# Patient Record
Sex: Female | Born: 1985 | Race: White | Hispanic: No | Marital: Single | Smoking: Never smoker
Health system: Southern US, Community
[De-identification: ages and names within clinical notes are randomized; demographics above are authoritative.]

## PROBLEM LIST (undated history)

## (undated) DIAGNOSIS — I10 Essential (primary) hypertension: Secondary | ICD-10-CM

## (undated) DIAGNOSIS — F419 Anxiety disorder, unspecified: Secondary | ICD-10-CM

## (undated) DIAGNOSIS — M79673 Pain in unspecified foot: Secondary | ICD-10-CM

## (undated) DIAGNOSIS — J302 Other seasonal allergic rhinitis: Secondary | ICD-10-CM

## (undated) DIAGNOSIS — R12 Heartburn: Secondary | ICD-10-CM

## (undated) DIAGNOSIS — F329 Major depressive disorder, single episode, unspecified: Secondary | ICD-10-CM

## (undated) DIAGNOSIS — F32A Depression, unspecified: Secondary | ICD-10-CM

## (undated) DIAGNOSIS — K219 Gastro-esophageal reflux disease without esophagitis: Secondary | ICD-10-CM

## (undated) DIAGNOSIS — K2970 Gastritis, unspecified, without bleeding: Secondary | ICD-10-CM

## (undated) HISTORY — DX: Anxiety disorder, unspecified: F41.9

## (undated) HISTORY — DX: Other seasonal allergic rhinitis: J30.2

## (undated) HISTORY — PX: FOOT SURGERY: SHX648

## (undated) HISTORY — PX: WRIST SURGERY: SHX841

## (undated) HISTORY — DX: Heartburn: R12

## (undated) HISTORY — DX: Gastritis, unspecified, without bleeding: K29.70

---
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
## 2005-05-19 ENCOUNTER — Emergency Department (HOSPITAL_COMMUNITY): Admission: EM | Admit: 2005-05-19 | Discharge: 2005-05-19 | Payer: Self-pay | Admitting: Emergency Medicine

## 2005-06-08 ENCOUNTER — Inpatient Hospital Stay: Payer: Self-pay | Admitting: Infectious Diseases

## 2005-06-10 ENCOUNTER — Emergency Department (HOSPITAL_COMMUNITY): Admission: EM | Admit: 2005-06-10 | Discharge: 2005-06-10 | Payer: Self-pay | Admitting: Emergency Medicine

## 2005-06-19 ENCOUNTER — Inpatient Hospital Stay (HOSPITAL_COMMUNITY): Admission: EM | Admit: 2005-06-19 | Discharge: 2005-06-20 | Payer: Self-pay | Admitting: Emergency Medicine

## 2005-07-07 ENCOUNTER — Emergency Department (HOSPITAL_COMMUNITY): Admission: EM | Admit: 2005-07-07 | Discharge: 2005-07-08 | Payer: Self-pay | Admitting: Emergency Medicine

## 2005-07-08 ENCOUNTER — Emergency Department (HOSPITAL_COMMUNITY): Admission: EM | Admit: 2005-07-08 | Discharge: 2005-07-09 | Payer: Self-pay | Admitting: Emergency Medicine

## 2005-07-10 ENCOUNTER — Emergency Department: Payer: Self-pay | Admitting: Emergency Medicine

## 2005-07-11 ENCOUNTER — Inpatient Hospital Stay (HOSPITAL_COMMUNITY): Admission: EM | Admit: 2005-07-11 | Discharge: 2005-07-16 | Payer: Self-pay | Admitting: Emergency Medicine

## 2005-07-24 ENCOUNTER — Emergency Department (HOSPITAL_COMMUNITY): Admission: EM | Admit: 2005-07-24 | Discharge: 2005-07-25 | Payer: Self-pay | Admitting: Emergency Medicine

## 2005-07-27 ENCOUNTER — Inpatient Hospital Stay (HOSPITAL_COMMUNITY): Admission: EM | Admit: 2005-07-27 | Discharge: 2005-07-29 | Payer: Self-pay | Admitting: Emergency Medicine

## 2005-08-05 ENCOUNTER — Inpatient Hospital Stay (HOSPITAL_COMMUNITY): Admission: EM | Admit: 2005-08-05 | Discharge: 2005-08-10 | Payer: Self-pay | Admitting: Emergency Medicine

## 2005-08-22 ENCOUNTER — Emergency Department (HOSPITAL_COMMUNITY): Admission: EM | Admit: 2005-08-22 | Discharge: 2005-08-23 | Payer: Self-pay | Admitting: Emergency Medicine

## 2005-08-23 ENCOUNTER — Inpatient Hospital Stay: Payer: Self-pay | Admitting: Internal Medicine

## 2005-08-26 ENCOUNTER — Emergency Department: Payer: Self-pay | Admitting: Emergency Medicine

## 2005-08-28 ENCOUNTER — Emergency Department: Payer: Self-pay | Admitting: Unknown Physician Specialty

## 2005-08-28 ENCOUNTER — Emergency Department (HOSPITAL_COMMUNITY): Admission: EM | Admit: 2005-08-28 | Discharge: 2005-08-28 | Payer: Self-pay | Admitting: Emergency Medicine

## 2005-08-29 ENCOUNTER — Emergency Department: Payer: Self-pay | Admitting: Emergency Medicine

## 2005-09-06 ENCOUNTER — Observation Stay: Payer: Self-pay | Admitting: Internal Medicine

## 2005-09-08 ENCOUNTER — Emergency Department (HOSPITAL_COMMUNITY): Admission: EM | Admit: 2005-09-08 | Discharge: 2005-09-08 | Payer: Self-pay | Admitting: Emergency Medicine

## 2005-09-09 ENCOUNTER — Emergency Department (HOSPITAL_COMMUNITY): Admission: EM | Admit: 2005-09-09 | Discharge: 2005-09-10 | Payer: Self-pay | Admitting: *Deleted

## 2005-09-10 ENCOUNTER — Emergency Department: Payer: Self-pay | Admitting: Emergency Medicine

## 2005-09-16 ENCOUNTER — Emergency Department (HOSPITAL_COMMUNITY): Admission: EM | Admit: 2005-09-16 | Discharge: 2005-09-16 | Payer: Self-pay | Admitting: Emergency Medicine

## 2005-09-17 ENCOUNTER — Emergency Department: Payer: Self-pay | Admitting: Emergency Medicine

## 2005-09-20 ENCOUNTER — Emergency Department: Payer: Self-pay | Admitting: Emergency Medicine

## 2005-09-21 ENCOUNTER — Emergency Department (HOSPITAL_COMMUNITY): Admission: EM | Admit: 2005-09-21 | Discharge: 2005-09-21 | Payer: Self-pay | Admitting: Emergency Medicine

## 2005-09-22 ENCOUNTER — Emergency Department: Payer: Self-pay | Admitting: Internal Medicine

## 2005-09-23 ENCOUNTER — Emergency Department: Payer: Self-pay | Admitting: Emergency Medicine

## 2005-09-28 ENCOUNTER — Emergency Department: Payer: Self-pay | Admitting: Emergency Medicine

## 2005-09-28 ENCOUNTER — Ambulatory Visit: Payer: Self-pay | Admitting: Emergency Medicine

## 2005-10-04 ENCOUNTER — Emergency Department: Payer: Self-pay | Admitting: Emergency Medicine

## 2005-10-05 ENCOUNTER — Emergency Department (HOSPITAL_COMMUNITY): Admission: EM | Admit: 2005-10-05 | Discharge: 2005-10-06 | Payer: Self-pay | Admitting: Emergency Medicine

## 2005-10-12 ENCOUNTER — Emergency Department: Payer: Self-pay | Admitting: Unknown Physician Specialty

## 2005-10-14 ENCOUNTER — Emergency Department (HOSPITAL_COMMUNITY): Admission: EM | Admit: 2005-10-14 | Discharge: 2005-10-14 | Payer: Self-pay | Admitting: Emergency Medicine

## 2005-10-15 ENCOUNTER — Emergency Department (HOSPITAL_COMMUNITY): Admission: EM | Admit: 2005-10-15 | Discharge: 2005-10-15 | Payer: Self-pay | Admitting: Emergency Medicine

## 2005-10-19 ENCOUNTER — Emergency Department (HOSPITAL_COMMUNITY): Admission: EM | Admit: 2005-10-19 | Discharge: 2005-10-19 | Payer: Self-pay | Admitting: Emergency Medicine

## 2006-01-06 ENCOUNTER — Emergency Department: Payer: Self-pay | Admitting: General Practice

## 2006-02-03 ENCOUNTER — Emergency Department: Payer: Self-pay | Admitting: Emergency Medicine

## 2006-02-15 ENCOUNTER — Emergency Department (HOSPITAL_COMMUNITY): Admission: EM | Admit: 2006-02-15 | Discharge: 2006-02-16 | Payer: Self-pay | Admitting: Emergency Medicine

## 2006-02-19 ENCOUNTER — Emergency Department: Payer: Self-pay | Admitting: Emergency Medicine

## 2006-03-02 ENCOUNTER — Emergency Department: Payer: Self-pay | Admitting: Unknown Physician Specialty

## 2006-03-10 ENCOUNTER — Emergency Department (HOSPITAL_COMMUNITY): Admission: EM | Admit: 2006-03-10 | Discharge: 2006-03-10 | Payer: Self-pay | Admitting: Emergency Medicine

## 2006-03-11 ENCOUNTER — Emergency Department (HOSPITAL_COMMUNITY): Admission: EM | Admit: 2006-03-11 | Discharge: 2006-03-11 | Payer: Self-pay | Admitting: Emergency Medicine

## 2006-07-30 ENCOUNTER — Emergency Department (HOSPITAL_COMMUNITY): Admission: EM | Admit: 2006-07-30 | Discharge: 2006-07-30 | Payer: Self-pay | Admitting: Family Medicine

## 2006-08-05 ENCOUNTER — Emergency Department (HOSPITAL_COMMUNITY): Admission: EM | Admit: 2006-08-05 | Discharge: 2006-08-05 | Payer: Self-pay | Admitting: Family Medicine

## 2006-09-10 ENCOUNTER — Emergency Department (HOSPITAL_COMMUNITY): Admission: EM | Admit: 2006-09-10 | Discharge: 2006-09-10 | Payer: Self-pay | Admitting: Family Medicine

## 2006-10-08 ENCOUNTER — Emergency Department (HOSPITAL_COMMUNITY): Admission: EM | Admit: 2006-10-08 | Discharge: 2006-10-08 | Payer: Self-pay | Admitting: Emergency Medicine

## 2006-10-28 ENCOUNTER — Emergency Department (HOSPITAL_COMMUNITY): Admission: EM | Admit: 2006-10-28 | Discharge: 2006-10-28 | Payer: Self-pay | Admitting: Emergency Medicine

## 2006-11-06 ENCOUNTER — Emergency Department (HOSPITAL_COMMUNITY): Admission: EM | Admit: 2006-11-06 | Discharge: 2006-11-06 | Payer: Self-pay | Admitting: Emergency Medicine

## 2006-11-07 ENCOUNTER — Emergency Department (HOSPITAL_COMMUNITY): Admission: EM | Admit: 2006-11-07 | Discharge: 2006-11-07 | Payer: Self-pay | Admitting: Emergency Medicine

## 2006-11-11 ENCOUNTER — Emergency Department (HOSPITAL_COMMUNITY): Admission: EM | Admit: 2006-11-11 | Discharge: 2006-11-11 | Payer: Self-pay | Admitting: Emergency Medicine

## 2006-11-12 ENCOUNTER — Emergency Department (HOSPITAL_COMMUNITY): Admission: EM | Admit: 2006-11-12 | Discharge: 2006-11-12 | Payer: Self-pay | Admitting: Family Medicine

## 2006-11-26 ENCOUNTER — Emergency Department (HOSPITAL_COMMUNITY): Admission: EM | Admit: 2006-11-26 | Discharge: 2006-11-26 | Payer: Self-pay | Admitting: Emergency Medicine

## 2006-12-08 ENCOUNTER — Emergency Department (HOSPITAL_COMMUNITY): Admission: EM | Admit: 2006-12-08 | Discharge: 2006-12-08 | Payer: Self-pay | Admitting: Family Medicine

## 2007-01-22 ENCOUNTER — Emergency Department (HOSPITAL_COMMUNITY): Admission: EM | Admit: 2007-01-22 | Discharge: 2007-01-22 | Payer: Self-pay | Admitting: Emergency Medicine

## 2007-01-31 ENCOUNTER — Emergency Department (HOSPITAL_COMMUNITY): Admission: EM | Admit: 2007-01-31 | Discharge: 2007-01-31 | Payer: Self-pay | Admitting: Emergency Medicine

## 2007-04-04 ENCOUNTER — Emergency Department (HOSPITAL_COMMUNITY): Admission: EM | Admit: 2007-04-04 | Discharge: 2007-04-04 | Payer: Self-pay | Admitting: Emergency Medicine

## 2007-04-25 ENCOUNTER — Emergency Department (HOSPITAL_COMMUNITY): Admission: EM | Admit: 2007-04-25 | Discharge: 2007-04-25 | Payer: Self-pay | Admitting: Family Medicine

## 2007-06-07 ENCOUNTER — Emergency Department (HOSPITAL_COMMUNITY): Admission: EM | Admit: 2007-06-07 | Discharge: 2007-06-07 | Payer: Self-pay | Admitting: Emergency Medicine

## 2007-08-23 ENCOUNTER — Emergency Department (HOSPITAL_COMMUNITY): Admission: EM | Admit: 2007-08-23 | Discharge: 2007-08-23 | Payer: Self-pay | Admitting: Family Medicine

## 2007-12-15 ENCOUNTER — Emergency Department (HOSPITAL_COMMUNITY): Admission: EM | Admit: 2007-12-15 | Discharge: 2007-12-15 | Payer: Self-pay | Admitting: Family Medicine

## 2007-12-21 ENCOUNTER — Emergency Department (HOSPITAL_COMMUNITY): Admission: EM | Admit: 2007-12-21 | Discharge: 2007-12-22 | Payer: Self-pay | Admitting: Emergency Medicine

## 2008-01-04 ENCOUNTER — Emergency Department (HOSPITAL_COMMUNITY): Admission: EM | Admit: 2008-01-04 | Discharge: 2008-01-04 | Payer: Self-pay | Admitting: Emergency Medicine

## 2008-02-29 ENCOUNTER — Emergency Department (HOSPITAL_COMMUNITY): Admission: EM | Admit: 2008-02-29 | Discharge: 2008-02-29 | Payer: Self-pay | Admitting: Family Medicine

## 2008-03-11 ENCOUNTER — Emergency Department (HOSPITAL_COMMUNITY): Admission: EM | Admit: 2008-03-11 | Discharge: 2008-03-11 | Payer: Self-pay | Admitting: Emergency Medicine

## 2008-05-13 ENCOUNTER — Emergency Department (HOSPITAL_COMMUNITY): Admission: EM | Admit: 2008-05-13 | Discharge: 2008-05-13 | Payer: Self-pay | Admitting: Family Medicine

## 2008-05-21 ENCOUNTER — Inpatient Hospital Stay (HOSPITAL_COMMUNITY): Admission: AD | Admit: 2008-05-21 | Discharge: 2008-05-22 | Payer: Self-pay | Admitting: Family Medicine

## 2008-05-24 ENCOUNTER — Inpatient Hospital Stay (HOSPITAL_COMMUNITY): Admission: AD | Admit: 2008-05-24 | Discharge: 2008-05-24 | Payer: Self-pay | Admitting: Gynecology

## 2008-08-15 ENCOUNTER — Inpatient Hospital Stay (HOSPITAL_COMMUNITY): Admission: AD | Admit: 2008-08-15 | Discharge: 2008-08-15 | Payer: Self-pay | Admitting: Obstetrics and Gynecology

## 2008-11-27 ENCOUNTER — Inpatient Hospital Stay (HOSPITAL_COMMUNITY): Admission: AD | Admit: 2008-11-27 | Discharge: 2008-11-29 | Payer: Self-pay | Admitting: Obstetrics & Gynecology

## 2009-01-05 ENCOUNTER — Inpatient Hospital Stay (HOSPITAL_COMMUNITY): Admission: AD | Admit: 2009-01-05 | Discharge: 2009-01-06 | Payer: Self-pay | Admitting: Obstetrics and Gynecology

## 2009-01-11 ENCOUNTER — Inpatient Hospital Stay (HOSPITAL_COMMUNITY): Admission: AD | Admit: 2009-01-11 | Discharge: 2009-01-11 | Payer: Self-pay | Admitting: Obstetrics & Gynecology

## 2009-01-11 ENCOUNTER — Ambulatory Visit: Payer: Self-pay | Admitting: Physician Assistant

## 2009-01-14 ENCOUNTER — Inpatient Hospital Stay (HOSPITAL_COMMUNITY): Admission: AD | Admit: 2009-01-14 | Discharge: 2009-01-14 | Payer: Self-pay | Admitting: Obstetrics and Gynecology

## 2009-01-23 ENCOUNTER — Inpatient Hospital Stay (HOSPITAL_COMMUNITY): Admission: AD | Admit: 2009-01-23 | Discharge: 2009-01-23 | Payer: Self-pay | Admitting: Obstetrics & Gynecology

## 2009-01-23 ENCOUNTER — Inpatient Hospital Stay (HOSPITAL_COMMUNITY): Admission: AD | Admit: 2009-01-23 | Discharge: 2009-01-26 | Payer: Self-pay | Admitting: Obstetrics & Gynecology

## 2009-03-30 ENCOUNTER — Inpatient Hospital Stay (HOSPITAL_COMMUNITY): Admission: EM | Admit: 2009-03-30 | Discharge: 2009-04-04 | Payer: Self-pay | Admitting: Emergency Medicine

## 2009-04-06 ENCOUNTER — Emergency Department (HOSPITAL_COMMUNITY): Admission: EM | Admit: 2009-04-06 | Discharge: 2009-04-07 | Payer: Self-pay | Admitting: Emergency Medicine

## 2009-04-07 ENCOUNTER — Inpatient Hospital Stay (HOSPITAL_COMMUNITY): Admission: RE | Admit: 2009-04-07 | Discharge: 2009-04-10 | Payer: Self-pay | Admitting: Radiation Oncology

## 2009-04-17 ENCOUNTER — Encounter: Admission: RE | Admit: 2009-04-17 | Discharge: 2009-07-16 | Payer: Self-pay | Admitting: Orthopedic Surgery

## 2009-09-05 ENCOUNTER — Ambulatory Visit (HOSPITAL_COMMUNITY): Admission: RE | Admit: 2009-09-05 | Discharge: 2009-09-05 | Payer: Self-pay | Admitting: Obstetrics and Gynecology

## 2009-11-11 ENCOUNTER — Inpatient Hospital Stay (HOSPITAL_COMMUNITY): Admission: AD | Admit: 2009-11-11 | Discharge: 2009-11-11 | Payer: Self-pay | Admitting: Obstetrics and Gynecology

## 2009-11-19 ENCOUNTER — Ambulatory Visit (HOSPITAL_BASED_OUTPATIENT_CLINIC_OR_DEPARTMENT_OTHER): Admission: RE | Admit: 2009-11-19 | Discharge: 2009-11-19 | Payer: Self-pay | Admitting: Orthopedic Surgery

## 2009-12-02 ENCOUNTER — Inpatient Hospital Stay (HOSPITAL_COMMUNITY): Admission: AD | Admit: 2009-12-02 | Discharge: 2009-12-02 | Payer: Self-pay | Admitting: Obstetrics and Gynecology

## 2010-01-06 ENCOUNTER — Encounter: Admission: RE | Admit: 2010-01-06 | Discharge: 2010-01-06 | Payer: Self-pay | Admitting: Obstetrics and Gynecology

## 2010-01-08 ENCOUNTER — Encounter
Admission: RE | Admit: 2010-01-08 | Discharge: 2010-01-09 | Payer: Self-pay | Admitting: Physical Medicine & Rehabilitation

## 2010-01-09 ENCOUNTER — Ambulatory Visit: Payer: Self-pay | Admitting: Physical Medicine & Rehabilitation

## 2010-01-29 ENCOUNTER — Ambulatory Visit: Payer: Self-pay | Admitting: Obstetrics and Gynecology

## 2010-02-02 ENCOUNTER — Inpatient Hospital Stay (HOSPITAL_COMMUNITY): Admission: AD | Admit: 2010-02-02 | Discharge: 2010-02-02 | Payer: Self-pay | Admitting: Obstetrics and Gynecology

## 2010-02-03 ENCOUNTER — Inpatient Hospital Stay (HOSPITAL_COMMUNITY): Admission: AD | Admit: 2010-02-03 | Discharge: 2010-02-03 | Payer: Self-pay | Admitting: Obstetrics & Gynecology

## 2010-03-31 ENCOUNTER — Emergency Department (HOSPITAL_COMMUNITY): Admission: EM | Admit: 2010-03-31 | Discharge: 2010-03-31 | Payer: Self-pay | Admitting: Emergency Medicine

## 2010-06-18 ENCOUNTER — Emergency Department: Payer: Self-pay | Admitting: Internal Medicine

## 2010-06-22 ENCOUNTER — Encounter: Admission: RE | Admit: 2010-06-22 | Discharge: 2010-07-27 | Payer: Self-pay | Admitting: Anesthesiology

## 2010-08-18 ENCOUNTER — Encounter: Admission: RE | Admit: 2010-08-18 | Discharge: 2010-08-18 | Payer: Self-pay | Admitting: Internal Medicine

## 2010-10-08 ENCOUNTER — Emergency Department (HOSPITAL_COMMUNITY): Admission: EM | Admit: 2010-10-08 | Discharge: 2010-06-16 | Payer: Self-pay | Admitting: Emergency Medicine

## 2010-12-08 ENCOUNTER — Emergency Department (HOSPITAL_COMMUNITY)
Admission: EM | Admit: 2010-12-08 | Discharge: 2010-12-09 | Disposition: A | Discharge status: Home / Self Care | Payer: Medicaid Other | Attending: Emergency Medicine | Admitting: Emergency Medicine

## 2010-12-08 DIAGNOSIS — J029 Acute pharyngitis, unspecified: Secondary | ICD-10-CM | POA: Insufficient documentation

## 2010-12-08 DIAGNOSIS — I10 Essential (primary) hypertension: Secondary | ICD-10-CM | POA: Insufficient documentation

## 2010-12-08 DIAGNOSIS — R1033 Periumbilical pain: Secondary | ICD-10-CM | POA: Insufficient documentation

## 2010-12-08 DIAGNOSIS — R51 Headache: Secondary | ICD-10-CM | POA: Insufficient documentation

## 2010-12-08 DIAGNOSIS — R110 Nausea: Secondary | ICD-10-CM | POA: Insufficient documentation

## 2010-12-08 DIAGNOSIS — K219 Gastro-esophageal reflux disease without esophagitis: Secondary | ICD-10-CM | POA: Insufficient documentation

## 2010-12-08 LAB — BASIC METABOLIC PANEL
BUN: 8 mg/dL (ref 6–23)
CO2: 26 mEq/L (ref 19–32)
Calcium: 9.7 mg/dL (ref 8.4–10.5)
Chloride: 108 mEq/L (ref 96–112)
Creatinine, Ser: 0.73 mg/dL (ref 0.4–1.2)
GFR calc Af Amer: >60 mL/min (ref >60)
GFR calc non Af Amer: >60 mL/min (ref >60)
Glucose, Bld: 102 mg/dL — ABNORMAL HIGH (ref 70–99)
Potassium: 4 mEq/L (ref 3.5–5.1)
Sodium: 142 mEq/L (ref 135–145)

## 2010-12-08 LAB — DIFFERENTIAL
Basophils Absolute: 0 10*3/uL (ref 0.0–0.1)
Basophils Relative: 0 % (ref 0–1)
Eosinophils Absolute: 0.3 10*3/uL (ref 0.0–0.7)
Eosinophils Relative: 4 % (ref 0–5)
Lymphocytes Relative: 24 % (ref 12–46)
Lymphs Abs: 2 10*3/uL (ref 0.7–4.0)
Monocytes Absolute: 0.4 10*3/uL (ref 0.1–1.0)
Monocytes Relative: 5 % (ref 3–12)
Neutro Abs: 5.8 10*3/uL (ref 1.7–7.7)
Neutrophils Relative %: 68 % (ref 43–77)

## 2010-12-08 LAB — CBC
HCT: 36.5 % (ref 36.0–46.0)
Hemoglobin: 12.4 g/dL (ref 12.0–15.0)
MCH: 29.7 pg (ref 26.0–34.0)
MCHC: 34 g/dL (ref 30.0–36.0)
MCV: 87.5 fL (ref 78.0–100.0)
Platelets: 208 10*3/uL (ref 150–400)
RBC: 4.17 MIL/uL (ref 3.87–5.11)
RDW: 13.2 % (ref 11.5–15.5)
WBC: 8.5 10*3/uL (ref 4.0–10.5)

## 2010-12-09 ENCOUNTER — Emergency Department (HOSPITAL_COMMUNITY): Payer: Medicaid Other

## 2010-12-09 LAB — URINALYSIS, ROUTINE W REFLEX MICROSCOPIC
Bilirubin Urine: NEGATIVE
Hgb urine dipstick: NEGATIVE
Ketones, ur: NEGATIVE mg/dL
Nitrite: NEGATIVE
Protein, ur: NEGATIVE mg/dL
Specific Gravity, Urine: 1.024 (ref 1.005–1.030)
Urine Glucose, Fasting: NEGATIVE mg/dL
Urobilinogen, UA: 1 mg/dL (ref 0.0–1.0)
pH: 6 (ref 5.0–8.0)

## 2010-12-09 LAB — RAPID STREP SCREEN (MED CTR MEBANE ONLY): Streptococcus, Group A Screen (Direct): NEGATIVE

## 2010-12-09 LAB — POCT PREGNANCY, URINE: Preg Test, Ur: NEGATIVE

## 2011-01-14 LAB — CBC
HCT: 36.7 % (ref 36.0–46.0)
Hemoglobin: 12.6 g/dL (ref 12.0–15.0)
MCH: 29.4 pg (ref 26.0–34.0)
MCHC: 34.3 g/dL (ref 30.0–36.0)
MCV: 85.5 fL (ref 78.0–100.0)
Platelets: 187 10*3/uL (ref 150–400)
RBC: 4.29 MIL/uL (ref 3.87–5.11)
RDW: 13.3 % (ref 11.5–15.5)
WBC: 8.3 10*3/uL (ref 4.0–10.5)

## 2011-01-14 LAB — COMPREHENSIVE METABOLIC PANEL
ALT: 22 U/L (ref 0–35)
AST: 17 U/L (ref 0–37)
Albumin: 3.7 g/dL (ref 3.5–5.2)
Alkaline Phosphatase: 77 U/L (ref 39–117)
BUN: 10 mg/dL (ref 6–23)
CO2: 24 mEq/L (ref 19–32)
Calcium: 9.2 mg/dL (ref 8.4–10.5)
Chloride: 109 mEq/L (ref 96–112)
Creatinine, Ser: 0.61 mg/dL (ref 0.4–1.2)
GFR calc Af Amer: >60 mL/min (ref >60)
GFR calc non Af Amer: >60 mL/min (ref >60)
Glucose, Bld: 103 mg/dL — ABNORMAL HIGH (ref 70–99)
Potassium: 4.1 mEq/L (ref 3.5–5.1)
Sodium: 139 mEq/L (ref 135–145)
Total Bilirubin: 0.6 mg/dL (ref 0.3–1.2)
Total Protein: 6.7 g/dL (ref 6.0–8.3)

## 2011-01-14 LAB — DIFFERENTIAL
Basophils Absolute: 0 10*3/uL (ref 0.0–0.1)
Basophils Relative: 0 % (ref 0–1)
Eosinophils Absolute: 0.3 10*3/uL (ref 0.0–0.7)
Eosinophils Relative: 4 % (ref 0–5)
Lymphocytes Relative: 28 % (ref 12–46)
Lymphs Abs: 2.3 10*3/uL (ref 0.7–4.0)
Monocytes Absolute: 0.4 10*3/uL (ref 0.1–1.0)
Monocytes Relative: 5 % (ref 3–12)
Neutro Abs: 5.2 10*3/uL (ref 1.7–7.7)
Neutrophils Relative %: 63 % (ref 43–77)

## 2011-01-14 LAB — URINALYSIS, ROUTINE W REFLEX MICROSCOPIC
Bilirubin Urine: NEGATIVE
Glucose, UA: NEGATIVE mg/dL
Hgb urine dipstick: NEGATIVE
Ketones, ur: NEGATIVE mg/dL
Nitrite: NEGATIVE
Protein, ur: NEGATIVE mg/dL
Specific Gravity, Urine: 1.024 (ref 1.005–1.030)
Urobilinogen, UA: 1 mg/dL (ref 0.0–1.0)
pH: 7 (ref 5.0–8.0)

## 2011-01-14 LAB — LIPASE, BLOOD: Lipase: 37 U/L (ref 11–59)

## 2011-01-14 LAB — POCT PREGNANCY, URINE: Preg Test, Ur: NEGATIVE

## 2011-01-17 LAB — URINALYSIS, ROUTINE W REFLEX MICROSCOPIC
Bilirubin Urine: NEGATIVE
Glucose, UA: NEGATIVE mg/dL
Hgb urine dipstick: NEGATIVE
Ketones, ur: NEGATIVE mg/dL
Nitrite: NEGATIVE
Protein, ur: NEGATIVE mg/dL
Specific Gravity, Urine: >1.03 — ABNORMAL HIGH (ref 1.005–1.030)
Urobilinogen, UA: 0.2 mg/dL (ref 0.0–1.0)
pH: 5.5 (ref 5.0–8.0)

## 2011-01-17 LAB — CBC
HCT: 39.6 % (ref 36.0–46.0)
Hemoglobin: 13.5 g/dL (ref 12.0–15.0)
MCHC: 34 g/dL (ref 30.0–36.0)
MCV: 86.6 fL (ref 78.0–100.0)
Platelets: 212 10*3/uL (ref 150–400)
RBC: 4.58 MIL/uL (ref 3.87–5.11)
RDW: 14.7 % (ref 11.5–15.5)
WBC: 10.6 10*3/uL — ABNORMAL HIGH (ref 4.0–10.5)

## 2011-01-17 LAB — WET PREP, GENITAL
Trich, Wet Prep: NONE SEEN
Yeast Wet Prep HPF POC: NONE SEEN

## 2011-01-17 LAB — GC/CHLAMYDIA PROBE AMP, GENITAL
Chlamydia, DNA Probe: NEGATIVE
GC Probe Amp, Genital: NEGATIVE

## 2011-01-17 LAB — POCT HEMOGLOBIN-HEMACUE: Hemoglobin: 11.9 g/dL — ABNORMAL LOW (ref 12.0–15.0)

## 2011-01-18 LAB — HEMOCCULT GUIAC POC 1CARD (OFFICE): Fecal Occult Bld: NEGATIVE

## 2011-01-18 LAB — CBC
HCT: 36.2 % (ref 36.0–46.0)
Hemoglobin: 12.5 g/dL (ref 12.0–15.0)
MCHC: 34.5 g/dL (ref 30.0–36.0)
MCV: 85.1 fL (ref 78.0–100.0)
Platelets: 195 10*3/uL (ref 150–400)
RBC: 4.26 MIL/uL (ref 3.87–5.11)
RDW: 14.6 % (ref 11.5–15.5)
WBC: 12.7 10*3/uL — ABNORMAL HIGH (ref 4.0–10.5)

## 2011-01-18 LAB — URINALYSIS, ROUTINE W REFLEX MICROSCOPIC
Bilirubin Urine: NEGATIVE
Glucose, UA: NEGATIVE mg/dL
Hgb urine dipstick: NEGATIVE
Ketones, ur: NEGATIVE mg/dL
Nitrite: NEGATIVE
Protein, ur: NEGATIVE mg/dL
Specific Gravity, Urine: 1.02 (ref 1.005–1.030)
Urobilinogen, UA: 0.2 mg/dL (ref 0.0–1.0)
pH: 6 (ref 5.0–8.0)

## 2011-01-18 LAB — DIFFERENTIAL
Basophils Absolute: 0 10*3/uL (ref 0.0–0.1)
Basophils Relative: 0 % (ref 0–1)
Eosinophils Absolute: 0.1 10*3/uL (ref 0.0–0.7)
Eosinophils Relative: 1 % (ref 0–5)
Lymphocytes Relative: 12 % (ref 12–46)
Lymphs Abs: 1.5 10*3/uL (ref 0.7–4.0)
Monocytes Absolute: 0.4 10*3/uL (ref 0.1–1.0)
Monocytes Relative: 3 % (ref 3–12)
Neutro Abs: 10.7 10*3/uL — ABNORMAL HIGH (ref 1.7–7.7)
Neutrophils Relative %: 84 % — ABNORMAL HIGH (ref 43–77)

## 2011-01-18 LAB — COMPREHENSIVE METABOLIC PANEL
ALT: 28 U/L (ref 0–35)
AST: 19 U/L (ref 0–37)
Albumin: 3.9 g/dL (ref 3.5–5.2)
Alkaline Phosphatase: 85 U/L (ref 39–117)
BUN: 7 mg/dL (ref 6–23)
CO2: 23 mEq/L (ref 19–32)
Calcium: 9.1 mg/dL (ref 8.4–10.5)
Chloride: 106 mEq/L (ref 96–112)
Creatinine, Ser: 0.78 mg/dL (ref 0.4–1.2)
GFR calc Af Amer: >60 mL/min (ref >60)
GFR calc non Af Amer: >60 mL/min (ref >60)
Glucose, Bld: 102 mg/dL — ABNORMAL HIGH (ref 70–99)
Potassium: 3.5 mEq/L (ref 3.5–5.1)
Sodium: 134 mEq/L — ABNORMAL LOW (ref 135–145)
Total Bilirubin: 1 mg/dL (ref 0.3–1.2)
Total Protein: 7.3 g/dL (ref 6.0–8.3)

## 2011-01-18 LAB — RAPID STREP SCREEN (MED CTR MEBANE ONLY): Streptococcus, Group A Screen (Direct): NEGATIVE

## 2011-01-18 LAB — LIPASE, BLOOD: Lipase: 27 U/L (ref 11–59)

## 2011-01-18 LAB — POCT PREGNANCY, URINE: Preg Test, Ur: NEGATIVE

## 2011-01-20 LAB — URINALYSIS, ROUTINE W REFLEX MICROSCOPIC
Bilirubin Urine: NEGATIVE
Bilirubin Urine: NEGATIVE
Glucose, UA: NEGATIVE mg/dL
Glucose, UA: NEGATIVE mg/dL
Ketones, ur: NEGATIVE mg/dL
Ketones, ur: NEGATIVE mg/dL
Leukocytes, UA: NEGATIVE
Nitrite: NEGATIVE
Nitrite: NEGATIVE
Protein, ur: NEGATIVE mg/dL
Protein, ur: NEGATIVE mg/dL
Specific Gravity, Urine: 1.02 (ref 1.005–1.030)
Specific Gravity, Urine: 1.015 (ref 1.005–1.030)
Urobilinogen, UA: 0.2 mg/dL (ref 0.0–1.0)
Urobilinogen, UA: 0.2 mg/dL (ref 0.0–1.0)
pH: 7 (ref 5.0–8.0)
pH: 7.5 (ref 5.0–8.0)

## 2011-01-20 LAB — POCT PREGNANCY, URINE: Preg Test, Ur: NEGATIVE

## 2011-01-20 LAB — CBC
HCT: 35.5 % — ABNORMAL LOW (ref 36.0–46.0)
Hemoglobin: 12.1 g/dL (ref 12.0–15.0)
MCHC: 34 g/dL (ref 30.0–36.0)
MCV: 86.5 fL (ref 78.0–100.0)
Platelets: 181 10*3/uL (ref 150–400)
RBC: 4.11 MIL/uL (ref 3.87–5.11)
RDW: 14.3 % (ref 11.5–15.5)
WBC: 8.2 10*3/uL (ref 4.0–10.5)

## 2011-01-20 LAB — URINE MICROSCOPIC-ADD ON

## 2011-01-24 LAB — POCT PREGNANCY, URINE: Preg Test, Ur: NEGATIVE

## 2011-02-08 LAB — URINALYSIS, MICROSCOPIC ONLY
Bilirubin Urine: NEGATIVE
Glucose, UA: NEGATIVE mg/dL
Hgb urine dipstick: NEGATIVE
Ketones, ur: NEGATIVE mg/dL
Nitrite: NEGATIVE
Protein, ur: NEGATIVE mg/dL
Specific Gravity, Urine: 1.018 (ref 1.005–1.030)
Urobilinogen, UA: 1 mg/dL (ref 0.0–1.0)
pH: 7.5 (ref 5.0–8.0)

## 2011-02-08 LAB — POCT I-STAT, CHEM 8
BUN: 11 mg/dL (ref 6–23)
Calcium, Ion: 1.14 mmol/L (ref 1.12–1.32)
Chloride: 109 mEq/L (ref 96–112)
Creatinine, Ser: 1 mg/dL (ref 0.4–1.2)
Glucose, Bld: 92 mg/dL (ref 70–99)
HCT: 35 % — ABNORMAL LOW (ref 36.0–46.0)
Hemoglobin: 11.9 g/dL — ABNORMAL LOW (ref 12.0–15.0)
Potassium: 3.9 mEq/L (ref 3.5–5.1)
Sodium: 137 mEq/L (ref 135–145)
TCO2: 19 mmol/L (ref 0–100)

## 2011-02-08 LAB — CBC
HCT: 29.9 % — ABNORMAL LOW (ref 36.0–46.0)
HCT: 31.1 % — ABNORMAL LOW (ref 36.0–46.0)
HCT: 33.9 % — ABNORMAL LOW (ref 36.0–46.0)
HCT: 36.3 % (ref 36.0–46.0)
Hemoglobin: 10.4 g/dL — ABNORMAL LOW (ref 12.0–15.0)
Hemoglobin: 10.5 g/dL — ABNORMAL LOW (ref 12.0–15.0)
Hemoglobin: 11.8 g/dL — ABNORMAL LOW (ref 12.0–15.0)
Hemoglobin: 12.4 g/dL (ref 12.0–15.0)
MCHC: 33.8 g/dL (ref 30.0–36.0)
MCHC: 34.1 g/dL (ref 30.0–36.0)
MCHC: 34.7 g/dL (ref 30.0–36.0)
MCHC: 34.9 g/dL (ref 30.0–36.0)
MCV: 85.6 fL (ref 78.0–100.0)
MCV: 85.7 fL (ref 78.0–100.0)
MCV: 86.4 fL (ref 78.0–100.0)
MCV: 86.6 fL (ref 78.0–100.0)
Platelets: 140 10*3/uL — ABNORMAL LOW (ref 150–400)
Platelets: 165 10*3/uL (ref 150–400)
Platelets: 244 10*3/uL (ref 150–400)
Platelets: 254 10*3/uL (ref 150–400)
RBC: 3.48 MIL/uL — ABNORMAL LOW (ref 3.87–5.11)
RBC: 3.63 MIL/uL — ABNORMAL LOW (ref 3.87–5.11)
RBC: 3.92 MIL/uL (ref 3.87–5.11)
RBC: 4.19 MIL/uL (ref 3.87–5.11)
RDW: 14.2 % (ref 11.5–15.5)
RDW: 14.3 % (ref 11.5–15.5)
RDW: 15 % (ref 11.5–15.5)
RDW: 15.1 % (ref 11.5–15.5)
WBC: 5.7 10*3/uL (ref 4.0–10.5)
WBC: 6.1 10*3/uL (ref 4.0–10.5)
WBC: 8.3 10*3/uL (ref 4.0–10.5)
WBC: 8.4 10*3/uL (ref 4.0–10.5)

## 2011-02-08 LAB — DIFFERENTIAL
Basophils Absolute: 0.1 10*3/uL (ref 0.0–0.1)
Basophils Relative: 1 % (ref 0–1)
Eosinophils Absolute: 0.1 10*3/uL (ref 0.0–0.7)
Eosinophils Relative: 1 % (ref 0–5)
Lymphocytes Relative: 14 % (ref 12–46)
Lymphs Abs: 1.1 10*3/uL (ref 0.7–4.0)
Monocytes Absolute: 0.4 10*3/uL (ref 0.1–1.0)
Monocytes Relative: 5 % (ref 3–12)
Neutro Abs: 6.6 10*3/uL (ref 1.7–7.7)
Neutrophils Relative %: 79 % — ABNORMAL HIGH (ref 43–77)

## 2011-02-08 LAB — URINALYSIS, ROUTINE W REFLEX MICROSCOPIC
Bilirubin Urine: NEGATIVE
Glucose, UA: NEGATIVE mg/dL
Ketones, ur: NEGATIVE mg/dL
Leukocytes, UA: NEGATIVE
Nitrite: NEGATIVE
Protein, ur: NEGATIVE mg/dL
Specific Gravity, Urine: 1.023 (ref 1.005–1.030)
Urobilinogen, UA: 1 mg/dL (ref 0.0–1.0)
pH: 6 (ref 5.0–8.0)

## 2011-02-08 LAB — BASIC METABOLIC PANEL
BUN: 2 mg/dL — ABNORMAL LOW (ref 6–23)
CO2: 29 mEq/L (ref 19–32)
Calcium: 8.8 mg/dL (ref 8.4–10.5)
Chloride: 106 mEq/L (ref 96–112)
Creatinine, Ser: 0.78 mg/dL (ref 0.4–1.2)
GFR calc Af Amer: >60 mL/min (ref >60)
GFR calc non Af Amer: >60 mL/min (ref >60)
Glucose, Bld: 94 mg/dL (ref 70–99)
Potassium: 3.3 mEq/L — ABNORMAL LOW (ref 3.5–5.1)
Sodium: 139 mEq/L (ref 135–145)

## 2011-02-08 LAB — URINE CULTURE
Colony Count: 25000
Special Requests: POSITIVE

## 2011-02-08 LAB — HCG, SERUM, QUALITATIVE: Preg, Serum: NEGATIVE

## 2011-02-08 LAB — URINE MICROSCOPIC-ADD ON

## 2011-02-08 LAB — PROTIME-INR
INR: 1 (ref 0.00–1.49)
Prothrombin Time: 13.8 seconds (ref 11.6–15.2)

## 2011-02-08 LAB — APTT: aPTT: 31 seconds (ref 24–37)

## 2011-02-08 LAB — POCT PREGNANCY, URINE: Preg Test, Ur: NEGATIVE

## 2011-02-09 LAB — URINALYSIS, ROUTINE W REFLEX MICROSCOPIC
Bilirubin Urine: NEGATIVE
Glucose, UA: NEGATIVE mg/dL
Ketones, ur: 15 mg/dL — AB
Leukocytes, UA: NEGATIVE
Nitrite: NEGATIVE
Protein, ur: 100 mg/dL — AB
Specific Gravity, Urine: 1.029 (ref 1.005–1.030)
Urobilinogen, UA: 1 mg/dL (ref 0.0–1.0)
pH: 5 (ref 5.0–8.0)

## 2011-02-09 LAB — DIFFERENTIAL
Basophils Absolute: 0 10*3/uL (ref 0.0–0.1)
Basophils Relative: 0 % (ref 0–1)
Eosinophils Absolute: 0.1 10*3/uL (ref 0.0–0.7)
Eosinophils Relative: 1 % (ref 0–5)
Lymphocytes Relative: 11 % — ABNORMAL LOW (ref 12–46)
Lymphs Abs: 1.3 10*3/uL (ref 0.7–4.0)
Monocytes Absolute: 0.3 10*3/uL (ref 0.1–1.0)
Monocytes Relative: 3 % (ref 3–12)
Neutro Abs: 9.7 10*3/uL — ABNORMAL HIGH (ref 1.7–7.7)
Neutrophils Relative %: 84 % — ABNORMAL HIGH (ref 43–77)

## 2011-02-09 LAB — CBC
HCT: 32.7 % — ABNORMAL LOW (ref 36.0–46.0)
HCT: 38.2 % (ref 36.0–46.0)
Hemoglobin: 11.4 g/dL — ABNORMAL LOW (ref 12.0–15.0)
Hemoglobin: 13 g/dL (ref 12.0–15.0)
MCHC: 34 g/dL (ref 30.0–36.0)
MCHC: 35 g/dL (ref 30.0–36.0)
MCV: 85.2 fL (ref 78.0–100.0)
MCV: 85.9 fL (ref 78.0–100.0)
Platelets: 153 10*3/uL (ref 150–400)
Platelets: 223 10*3/uL (ref 150–400)
RBC: 3.8 MIL/uL — ABNORMAL LOW (ref 3.87–5.11)
RBC: 4.48 MIL/uL (ref 3.87–5.11)
RDW: 14.2 % (ref 11.5–15.5)
RDW: 14.7 % (ref 11.5–15.5)
WBC: 11.5 10*3/uL — ABNORMAL HIGH (ref 4.0–10.5)
WBC: 6.1 10*3/uL (ref 4.0–10.5)

## 2011-02-09 LAB — COMPREHENSIVE METABOLIC PANEL
ALT: 51 U/L — ABNORMAL HIGH (ref 0–35)
AST: 42 U/L — ABNORMAL HIGH (ref 0–37)
Albumin: 3.8 g/dL (ref 3.5–5.2)
Alkaline Phosphatase: 78 U/L (ref 39–117)
BUN: 6 mg/dL (ref 6–23)
CO2: 22 mEq/L (ref 19–32)
Calcium: 8.9 mg/dL (ref 8.4–10.5)
Chloride: 108 mEq/L (ref 96–112)
Creatinine, Ser: 0.86 mg/dL (ref 0.4–1.2)
GFR calc Af Amer: >60 mL/min (ref >60)
GFR calc non Af Amer: >60 mL/min (ref >60)
Glucose, Bld: 123 mg/dL — ABNORMAL HIGH (ref 70–99)
Potassium: 2.8 mEq/L — ABNORMAL LOW (ref 3.5–5.1)
Sodium: 139 mEq/L (ref 135–145)
Total Bilirubin: 0.4 mg/dL (ref 0.3–1.2)
Total Protein: 6.9 g/dL (ref 6.0–8.3)

## 2011-02-09 LAB — POCT I-STAT, CHEM 8
BUN: 6 mg/dL (ref 6–23)
Calcium, Ion: 1.16 mmol/L (ref 1.12–1.32)
Chloride: 108 mEq/L (ref 96–112)
Creatinine, Ser: 1 mg/dL (ref 0.4–1.2)
Glucose, Bld: 119 mg/dL — ABNORMAL HIGH (ref 70–99)
HCT: 38 % (ref 36.0–46.0)
Hemoglobin: 12.9 g/dL (ref 12.0–15.0)
Potassium: 3.1 mEq/L — ABNORMAL LOW (ref 3.5–5.1)
Sodium: 140 mEq/L (ref 135–145)
TCO2: 20 mmol/L (ref 0–100)

## 2011-02-09 LAB — BASIC METABOLIC PANEL
BUN: 4 mg/dL — ABNORMAL LOW (ref 6–23)
CO2: 26 mEq/L (ref 19–32)
Calcium: 8.8 mg/dL (ref 8.4–10.5)
Chloride: 106 mEq/L (ref 96–112)
Creatinine, Ser: 0.81 mg/dL (ref 0.4–1.2)
GFR calc Af Amer: >60 mL/min (ref >60)
GFR calc non Af Amer: >60 mL/min (ref >60)
Glucose, Bld: 87 mg/dL (ref 70–99)
Potassium: 3.6 mEq/L (ref 3.5–5.1)
Sodium: 138 mEq/L (ref 135–145)

## 2011-02-09 LAB — PREGNANCY, URINE: Preg Test, Ur: NEGATIVE

## 2011-02-09 LAB — PROTIME-INR
INR: 1.1 (ref 0.00–1.49)
Prothrombin Time: 14.4 seconds (ref 11.6–15.2)

## 2011-02-09 LAB — URINE MICROSCOPIC-ADD ON

## 2011-02-09 LAB — APTT: aPTT: 26 seconds (ref 24–37)

## 2011-02-11 LAB — COMPREHENSIVE METABOLIC PANEL
ALT: 16 U/L (ref 0–35)
ALT: 17 U/L (ref 0–35)
AST: 18 U/L (ref 0–37)
AST: 18 U/L (ref 0–37)
Albumin: 2.7 g/dL — ABNORMAL LOW (ref 3.5–5.2)
Albumin: 2.9 g/dL — ABNORMAL LOW (ref 3.5–5.2)
Alkaline Phosphatase: 122 U/L — ABNORMAL HIGH (ref 39–117)
Alkaline Phosphatase: 139 U/L — ABNORMAL HIGH (ref 39–117)
BUN: 5 mg/dL — ABNORMAL LOW (ref 6–23)
BUN: 5 mg/dL — ABNORMAL LOW (ref 6–23)
CO2: 19 mEq/L (ref 19–32)
CO2: 23 mEq/L (ref 19–32)
Calcium: 8.8 mg/dL (ref 8.4–10.5)
Calcium: 9.1 mg/dL (ref 8.4–10.5)
Chloride: 107 mEq/L (ref 96–112)
Chloride: 109 mEq/L (ref 96–112)
Creatinine, Ser: 0.48 mg/dL (ref 0.4–1.2)
Creatinine, Ser: 0.55 mg/dL (ref 0.4–1.2)
GFR calc Af Amer: >60 mL/min (ref >60)
GFR calc Af Amer: >60 mL/min (ref >60)
GFR calc non Af Amer: >60 mL/min (ref >60)
GFR calc non Af Amer: >60 mL/min (ref >60)
Glucose, Bld: 88 mg/dL (ref 70–99)
Glucose, Bld: 89 mg/dL (ref 70–99)
Potassium: 3.7 mEq/L (ref 3.5–5.1)
Potassium: 3.8 mEq/L (ref 3.5–5.1)
Sodium: 136 mEq/L (ref 135–145)
Sodium: 136 mEq/L (ref 135–145)
Total Bilirubin: 0.2 mg/dL — ABNORMAL LOW (ref 0.3–1.2)
Total Bilirubin: 0.6 mg/dL (ref 0.3–1.2)
Total Protein: 5.5 g/dL — ABNORMAL LOW (ref 6.0–8.3)
Total Protein: 6.1 g/dL (ref 6.0–8.3)

## 2011-02-11 LAB — CBC
HCT: 32.2 % — ABNORMAL LOW (ref 36.0–46.0)
HCT: 35.7 % — ABNORMAL LOW (ref 36.0–46.0)
HCT: 36.8 % (ref 36.0–46.0)
Hemoglobin: 11 g/dL — ABNORMAL LOW (ref 12.0–15.0)
Hemoglobin: 12.1 g/dL (ref 12.0–15.0)
Hemoglobin: 12.6 g/dL (ref 12.0–15.0)
MCHC: 34 g/dL (ref 30.0–36.0)
MCHC: 34.1 g/dL (ref 30.0–36.0)
MCHC: 34.2 g/dL (ref 30.0–36.0)
MCV: 87.7 fL (ref 78.0–100.0)
MCV: 88.4 fL (ref 78.0–100.0)
MCV: 88.9 fL (ref 78.0–100.0)
Platelets: 155 10*3/uL (ref 150–400)
Platelets: 186 10*3/uL (ref 150–400)
Platelets: 205 10*3/uL (ref 150–400)
RBC: 3.61 MIL/uL — ABNORMAL LOW (ref 3.87–5.11)
RBC: 4.07 MIL/uL (ref 3.87–5.11)
RBC: 4.16 MIL/uL (ref 3.87–5.11)
RDW: 14.5 % (ref 11.5–15.5)
RDW: 14.6 % (ref 11.5–15.5)
RDW: 14.8 % (ref 11.5–15.5)
WBC: 12.1 10*3/uL — ABNORMAL HIGH (ref 4.0–10.5)
WBC: 13.6 10*3/uL — ABNORMAL HIGH (ref 4.0–10.5)
WBC: 9.5 10*3/uL (ref 4.0–10.5)

## 2011-02-11 LAB — URINALYSIS, ROUTINE W REFLEX MICROSCOPIC
Bilirubin Urine: NEGATIVE
Bilirubin Urine: NEGATIVE
Glucose, UA: NEGATIVE mg/dL
Glucose, UA: NEGATIVE mg/dL
Hgb urine dipstick: NEGATIVE
Hgb urine dipstick: NEGATIVE
Ketones, ur: NEGATIVE mg/dL
Ketones, ur: NEGATIVE mg/dL
Nitrite: NEGATIVE
Nitrite: NEGATIVE
Protein, ur: NEGATIVE mg/dL
Protein, ur: NEGATIVE mg/dL
Specific Gravity, Urine: 1.025 (ref 1.005–1.030)
Specific Gravity, Urine: 1.02 (ref 1.005–1.030)
Urobilinogen, UA: 0.2 mg/dL (ref 0.0–1.0)
Urobilinogen, UA: 0.2 mg/dL (ref 0.0–1.0)
pH: 6.5 (ref 5.0–8.0)
pH: 6.5 (ref 5.0–8.0)

## 2011-02-11 LAB — URIC ACID
Uric Acid, Serum: 4 mg/dL (ref 2.4–7.0)
Uric Acid, Serum: 4.3 mg/dL (ref 2.4–7.0)

## 2011-02-11 LAB — LACTATE DEHYDROGENASE
LDH: 116 U/L (ref 94–250)
LDH: 136 U/L (ref 94–250)

## 2011-02-11 LAB — RPR: RPR Ser Ql: NONREACTIVE

## 2011-02-15 LAB — DIFFERENTIAL
Basophils Absolute: 0 10*3/uL (ref 0.0–0.1)
Basophils Absolute: 0 10*3/uL (ref 0.0–0.1)
Basophils Relative: 0 % (ref 0–1)
Basophils Relative: 0 % (ref 0–1)
Eosinophils Absolute: 0 10*3/uL (ref 0.0–0.7)
Eosinophils Absolute: 0 10*3/uL (ref 0.0–0.7)
Eosinophils Relative: 0 % (ref 0–5)
Eosinophils Relative: 0 % (ref 0–5)
Lymphocytes Relative: 13 % (ref 12–46)
Lymphocytes Relative: 3 % — ABNORMAL LOW (ref 12–46)
Lymphs Abs: 0.4 10*3/uL — ABNORMAL LOW (ref 0.7–4.0)
Lymphs Abs: 1.2 10*3/uL (ref 0.7–4.0)
Monocytes Absolute: 0.3 10*3/uL (ref 0.1–1.0)
Monocytes Absolute: 0.5 10*3/uL (ref 0.1–1.0)
Monocytes Relative: 2 % — ABNORMAL LOW (ref 3–12)
Monocytes Relative: 5 % (ref 3–12)
Neutro Abs: 14.5 10*3/uL — ABNORMAL HIGH (ref 1.7–7.7)
Neutro Abs: 7.3 10*3/uL (ref 1.7–7.7)
Neutrophils Relative %: 81 % — ABNORMAL HIGH (ref 43–77)
Neutrophils Relative %: 95 % — ABNORMAL HIGH (ref 43–77)

## 2011-02-15 LAB — COMPREHENSIVE METABOLIC PANEL
ALT: 17 U/L (ref 0–35)
ALT: 18 U/L (ref 0–35)
AST: 16 U/L (ref 0–37)
AST: 17 U/L (ref 0–37)
Albumin: 2.4 g/dL — ABNORMAL LOW (ref 3.5–5.2)
Albumin: 2.8 g/dL — ABNORMAL LOW (ref 3.5–5.2)
Alkaline Phosphatase: 74 U/L (ref 39–117)
Alkaline Phosphatase: 87 U/L (ref 39–117)
BUN: 4 mg/dL — ABNORMAL LOW (ref 6–23)
BUN: 6 mg/dL (ref 6–23)
CO2: 22 mEq/L (ref 19–32)
CO2: 27 mEq/L (ref 19–32)
Calcium: 8.8 mg/dL (ref 8.4–10.5)
Calcium: 8.9 mg/dL (ref 8.4–10.5)
Chloride: 108 mEq/L (ref 96–112)
Chloride: 108 mEq/L (ref 96–112)
Creatinine, Ser: 0.49 mg/dL (ref 0.4–1.2)
Creatinine, Ser: 0.55 mg/dL (ref 0.4–1.2)
GFR calc Af Amer: >60 mL/min (ref >60)
GFR calc Af Amer: >60 mL/min (ref >60)
GFR calc non Af Amer: >60 mL/min (ref >60)
GFR calc non Af Amer: >60 mL/min (ref >60)
Glucose, Bld: 104 mg/dL — ABNORMAL HIGH (ref 70–99)
Glucose, Bld: 81 mg/dL (ref 70–99)
Potassium: 3.8 mEq/L (ref 3.5–5.1)
Potassium: 3.9 mEq/L (ref 3.5–5.1)
Sodium: 136 mEq/L (ref 135–145)
Sodium: 139 mEq/L (ref 135–145)
Total Bilirubin: 0.5 mg/dL (ref 0.3–1.2)
Total Bilirubin: 0.7 mg/dL (ref 0.3–1.2)
Total Protein: 4.9 g/dL — ABNORMAL LOW (ref 6.0–8.3)
Total Protein: 6.2 g/dL (ref 6.0–8.3)

## 2011-02-15 LAB — URINALYSIS, ROUTINE W REFLEX MICROSCOPIC
Glucose, UA: NEGATIVE mg/dL
Hgb urine dipstick: NEGATIVE
Ketones, ur: 15 mg/dL — AB
Leukocytes, UA: NEGATIVE
Nitrite: NEGATIVE
Protein, ur: 30 mg/dL — AB
Specific Gravity, Urine: >1.03 — ABNORMAL HIGH (ref 1.005–1.030)
Urobilinogen, UA: 0.2 mg/dL (ref 0.0–1.0)
pH: 6 (ref 5.0–8.0)

## 2011-02-15 LAB — CBC
HCT: 30.8 % — ABNORMAL LOW (ref 36.0–46.0)
HCT: 36 % (ref 36.0–46.0)
Hemoglobin: 10.6 g/dL — ABNORMAL LOW (ref 12.0–15.0)
Hemoglobin: 12.4 g/dL (ref 12.0–15.0)
MCHC: 34.4 g/dL (ref 30.0–36.0)
MCHC: 34.6 g/dL (ref 30.0–36.0)
MCV: 86.5 fL (ref 78.0–100.0)
MCV: 87.4 fL (ref 78.0–100.0)
Platelets: 154 10*3/uL (ref 150–400)
Platelets: 174 10*3/uL (ref 150–400)
RBC: 3.52 MIL/uL — ABNORMAL LOW (ref 3.87–5.11)
RBC: 4.16 MIL/uL (ref 3.87–5.11)
RDW: 14.6 % (ref 11.5–15.5)
RDW: 15.1 % (ref 11.5–15.5)
WBC: 15.4 10*3/uL — ABNORMAL HIGH (ref 4.0–10.5)
WBC: 9 10*3/uL (ref 4.0–10.5)

## 2011-02-15 LAB — URINE MICROSCOPIC-ADD ON

## 2011-02-15 LAB — LIPASE, BLOOD
Lipase: 21 U/L (ref 11–59)
Lipase: 21 U/L (ref 11–59)

## 2011-02-15 LAB — AMYLASE
Amylase: 39 U/L (ref 27–131)
Amylase: 70 U/L (ref 27–131)

## 2011-02-15 LAB — LACTATE DEHYDROGENASE: LDH: 99 U/L (ref 94–250)

## 2011-02-15 LAB — URIC ACID: Uric Acid, Serum: 5.4 mg/dL (ref 2.4–7.0)
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
## 2011-03-16 NOTE — Discharge Summary (Signed)
[name], [name]             ACCOUNT NO.:  [id number]   MEDICAL RECORD NO.:  [id number]          PATIENT TYPE:  INP   LOCATION:  9155                          FACILITY:  [facility]   PHYSICIAN:  Kendra H. Tenny Craw, MD     DATE OF BIRTH:  1986-08-07   DATE OF ADMISSION:  11/27/2008  DATE OF DISCHARGE:  11/29/2008                               DISCHARGE SUMMARY   ADMITTING PHYSICIAN:  Gerrit Friends. Aldona Bar, MD   DISCHARGING PHYSICIAN:  Freddrick March. Tenny Craw, MD   HOSPITAL COURSE:  This is a 25 year old G2, P0 at 40 weeks and 2 days  estimated gestational age who was admitted on January 27 complaining of  severe right upper quadrant epigastric pain, nausea, and vomiting for 12  hours.  She denied any fevers or chills or contractions.  She states  that starting at 3:00 a.m. on January 27, she had woke up vomiting and  was unable to keep food or drink down.  She also did note some diarrhea.  Upon admission she was noted to have an elevated white blood cell count  at 15.4 thousand.  During her hospitalization, her of nausea, vomiting,  and pain waxed and waned.  She did get treated with Phenergan and  Stadol, which improved her discomfort immensely.  An abdominal  ultrasound was performed, which demonstrated normal gallbladder, normal  kidneys, a slightly enlarged spleen consistent with the physiology of  pregnancy, but no other abnormal findings.  On the evening of January  28, she had tolerated a regular diet without nausea or vomiting and had  excellent pain control; however, she did not have a ride and could not  be discharged home.  On January 29, the patient was feeling much better.  Her epigastric pain, nausea, and vomiting had completely resolved.  She  tolerated regular diet without pain or nausea, had no further diarrhea  noted.  Active fetal movement and no contractions and the decision was  made to discharge home with a prescription for Nexium for possible  gastroesophageal reflux disease.  She  also has prescriptions for  Phenergan at home.  She was advised to follow up in the office on  Wednesday of next week.   DISCHARGE PROCEDURES:  1. IV fluid hydration.  2. Pain control.  3. Abdominal ultrasound.  4. Blood work.   DISCHARGE LABORATORY DATA:  Sodium 139, potassium 3.8, chloride 108, CO2  27, BUN 4, creatinine 0.55, glucose 81, AST 16, ALT 17, uric acid 5.4,  LDH 99, amylase 39, lipase 21.  CBC on January 28, white blood cell  count 9, hemoglobin 10.6, hematocrit 30.8, platelets 154.      Freddrick March. Tenny Craw, MD  Electronically Signed     KHR/MEDQ  D:  11/29/2008  T:  11/30/2008  Job:  [id number]
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
## 2011-03-16 NOTE — Op Note (Signed)
[name], [name]             ACCOUNT NO.:  [id number]   MEDICAL RECORD NO.:  [id number]          PATIENT TYPE:  INP   LOCATION:  5021                         FACILITY:  [facility]   PHYSICIAN:  Artist Pais. Weingold, M.D.DATE OF BIRTH:  12/03/1985   DATE OF PROCEDURE:  04/07/2009  DATE OF DISCHARGE:                               OPERATIVE REPORT   PREOPERATIVE DIAGNOSIS:  Displaced right wrist radial and ulnar styloid  fractures.   POSTOPERATIVE DIAGNOSIS:  Displaced right wrist radial and ulnar styloid  fractures.   PROCEDURE:  Open reduction and internal fixation of displaced right  wrist radial and ulnar styloid fractures.   SURGEON:  Artist Pais. Mina Marble, MD   ASSISTANT:  None.   ANESTHESIA:  General.   TOURNIQUET TIME:  30 minutes.   COMPLICATIONS:  None.   DRAINS:  None.   OPERATIVE REPORT:  The patient was taken to the operating suite after  induction of adequate general anesthesia.  The right upper extremity and  left lower extremity were prepped and draped in the usual sterile  fashion.  Dr. Leonides Grills is proceeding with a left foot operative  fixation for a Lisfranc's fracture dislocation, we proceeded with the  right wrist.  The patient's right upper extremity again was prepped and  draped in the usual sterile fashion.  An Esmarch was used to  exsanguinate the limb.  Tourniquet was inflated to 50 mmHg at this point  in time.  Incision was made over the radial styloid, right wrist and  skin was incised 2-3 cm.  Dissection was carried down to the tip of the  radial styloid.  The superficial radial nerve was retracted.  A standard  guidewire from the Acutrak and screws was driven from the radial styloid  proximally and ulnarly.  Reduction was performed.  It was then drilled  and a 22-mm standard Acutrak screw was placed from radial to ulnar.  This incision was irrigated and loosely closed with a 3-0 Prolene  subcuticular stitch.  Second incision was made over the  tip of the ulnar  styloid.  Ulnar styloid was dissected down to a small incision.  The tip  of the ulnar styloid was identified.  It was secured with a micro  Acutrak guide wire which was placed from distal to proximal across the  fracture site.  A 60-mm micro Acutrak screw was  then placed to fix the ulnar styloid.  Once this was done, the wound was  irrigated and loosely closed with a 3-0 Prolene subcuticular stitch.  Steri-Strips, 4 x 4s fluffs, and a volar splint was applied.  The  patient tolerated the procedures well and went to the recovery room in  stable fashion.      Artist Pais Mina Marble, M.D.  Electronically Signed     MAW/MEDQ  D:  04/07/2009  T:  04/08/2009  Job:  [id number]

## 2011-03-19 NOTE — Discharge Summary (Signed)
[name], [name]             ACCOUNT NO.:  [id number]   MEDICAL RECORD NO.:  [id number]          PATIENT TYPE:  INP   LOCATION:  1505                         FACILITY:  [facility]   PHYSICIAN:  Elliot Cousin, M.D.    DATE OF BIRTH:  1986/09/18   DATE OF ADMISSION:  07/26/2005  DATE OF DISCHARGE:  07/29/2005                                 DISCHARGE SUMMARY   DISCHARGE DIAGNOSES:  1.  Abdominal pain associated with nausea and vomiting.  2.  History of distal esophagitis.  3.  Hypokalemia.  4.  Hypertension.  5.  Polysubstance abuse.  6.  Obesity.  7.  History of pyelonephritis.  8.  History of nausea and vomiting at 38 or 25 years old requiring      hospitalization.  The nausea, vomiting was associated with the patient's      mother dying.   HISTORY OF PRESENT ILLNESS:  The patient is a 25 year old lady with a past  medical history significant for hypertension and obesity, who presented to  the emergency department on September25,2006 with a chief complaint of  abdominal pain, nausea, and vomiting.  The patient was recently discharged  from the hospital several weeks ago with the same symptoms.  During the  previous hospitalization, she was extensively evaluated with an abdominal  scan, hepatobiliary scan, small bowel follow-through, and EGD.  The only  remarkable finding was grade 1 distal esophagitis.  The patient was treated  symptomatically and discharged to home with nonresolution of her symptoms.  The patient believes that during this admission, the nausea and vomiting and  abdominal pain were precipitated by eating [place] food.  The patient was  again admitted for further evaluation and management.   HOSPITAL COURSE:  #1 - ABDOMINAL PAIN ASSOCIATED WITH NAUSEA AND VOMITING:  The patient was made n.p.o.  She was started on volume repletion with normal  saline with potassium chloride added.  She was treated with Phenergan for  the nausea and vomiting.  She was started on  prophylactic and empiric  treatment with Protonix at 40 mg daily.  Her pain was treated with as-needed  Dilaudid and oxycodone.  For further evaluation, an urinalysis was ordered.  The urinalysis was essentially negative for WBCs and only a few bacteria.  The urine culture grew out 20,000 colonies of multiple nonpathogenic  pathogens.  A urine specimen was also sent for evaluation of chlamydia and  gonorrhea.  Both the LCR for gonorrhea and chlamydia were negative.  The  patient's symptoms eventually subsided and more or less resolved at the time  of hospital discharge.  The patient's diet was advanced from n.p.o. to full  liquids and then to a low-fat diet.  The patient tolerated the progression  in diet well.  The patient was advised to continue Protonix at 40 mg daily.  When the Protonix runs out, the patient was advised to take Prilosec OTC.  The patient currently does not have insurance for medications.  She was  advised to follow up with [facility] for further assistance.  Hyoscyamine,  was tried for possible irritable bowel syndrome symptoms.  She was started  at 0.125 mg before each meal.  It is unclear if the hyoscyamine assisted  with resolving her symptoms or not.  However, she was advised to continue  the hyoscyamine.  The patient was also advised to avoid greasy fried and  spicy foods.   #2 - DISTAL ESOPHAGITIS:  During the previous hospitalization, an EGD  revealed that the patient had distal esophagitis.  As stated above, she was  continued on treatment with Protonix.   #3 - HYPOKALEMIA:  The patient was hypokalemic on admission with a serum  potassium of 3.1.  She was repleted with potassium chloride by mouth and in  the IV fluids.  She was advised to continue potassium chloride at 20 mEq  daily for the next 3 days.   #4 - HYPERTENSION:  The patient's blood pressures were well controlled on  Catapres patch 0.2 mg every 7 days.  However, the patient was advised to   discontinue the clonidine patch and change to oral clonidine secondary to  cost.  The patient was therefore discharged to home on clonidine 0.2 mg 1/2  a pill b.i.d.  Again, the patient was advised to seek health care and  assistance with medications at the [facility].   DISCHARGE MEDICATIONS:  1.  Protonix 40 mg daily.  When the Protonix runs out, take Prilosec OTC 1      tablet daily.  2.  Catapres patch 0.2 mg, change every 7 days.  When the Catapres patch      runs out, start clonidine 0.2, 1/2 tablet b.i.d.  3.  Hyoscyamine 0.125 mg take 1 tablet before each meal.  4.  Potassium chloride 20 mEq daily for 3 additional days.  5.  Percocet 1 tablet q.6h. p.r.n. for pain.  6.  Phenergan 25 mg 1 pill q.4h. p.r.n. for nausea.   DISCHARGE DISPOSITION:  The patient was discharged home in improved and  stable condition on September28,2006.  She was advised to follow up with the  [facility] on [address] at 2:30 p.m.      Elliot Cousin, M.D.  Electronically Signed     DF/MEDQ  D:  08/03/2005  T:  08/04/2005  Job:  [id number]

## 2011-03-19 NOTE — H&P (Signed)
NAMEOKIE, JANSSON             ACCOUNT NO.:  [id number]   MEDICAL RECORD NO.:  [id number]          PATIENT TYPE:  INP   LOCATION:  1505                         FACILITY:  [facility]   PHYSICIAN:  Mobolaji B. Bakare, M.D.DATE OF BIRTH:  12/20/1985   DATE OF ADMISSION:  07/26/2005  DATE OF DISCHARGE:                                HISTORY & PHYSICAL   PRIMARY CARE PHYSICIAN:  Unassigned.   CHIEF COMPLAINT:  Nausea, vomiting for 3 days, epigastric pain for 3 days.   HISTORY OF PRESENTING COMPLAINT:  Ms. Filkins is a 25 year old Caucasian  female who was recently discharged from the hospital on July 16, 2005  after having undergone evaluation for similar complaints of intractable  nausea and vomiting with epigastric pain. She had extensive workup with CT  abdomen and pelvis, hepatobiliary scan, upper endoscopy. The only remarkable  finding was grade 1 distal esophagitis. She was treated symptomatically and  pain resolved prior to discharge. She was then able to keep food down. She  had a dish of salsa, a [place] side dish which is spicy, on Friday and  subsequently she started having epigastric pain and nausea and vomiting. She  has not been able to keep any food down and epigastric pain is aggravated   Dictation ended at this point.      Mobolaji B. Corky Downs, M.D.  Electronically Signed     MBB/MEDQ  D:  07/26/2005  T:  07/27/2005  Job:  [id number]

## 2011-03-19 NOTE — Discharge Summary (Signed)
[name], [name]             ACCOUNT NO.:  [id number]   MEDICAL RECORD NO.:  [id number]          PATIENT TYPE:  INP   LOCATION:  5734                         FACILITY:  [facility]   PHYSICIAN:  Isidor Holts, M.D.  DATE OF BIRTH:  [birth year]   DATE OF ADMISSION:  07/10/2005  DATE OF DISCHARGE:  07/16/2005                                 DISCHARGE SUMMARY   DISCHARGE DIAGNOSES:  1.  Nonspecific abdominal pain.  2.  Polysubstance abuse.  3.  Hypertension.  4.  Pyelonephritis.   DISCHARGE MEDICATIONS:  1.  Ciprofloxacin 500 mg p.o. b.i.d. on July 17, 2005, only.  2.  Catapres TTS-II patch one patch to skin weekly.  3.  Protonix 40 mg p.o. daily.  4.  Lotensin discontinued.   PROCEDURES:  1.  Abdominal/pelvic CT scan dated July 11, 2005.  This showed stable      renal hypodensity, also borderline splenomegaly not significantly      changed. Cysts in the ovaries, 2.7 and 3 cm in the right and left      ovaries respectively.  The gallbladder, pancreas, adrenal glands are      within normal limits.  2.  Hepatobiliary scan with gallbladder ejection fraction, i.e. HIDA scan,      dated July 13, 2005, that showed no evidence of acute      cholecystitis, normal gallbladder ejection fraction at 81%.  3.  Small bowel follow through July 15, 2005.  It was an unremarkable      study.  4.  EGD dated July 12, 2005.  This showed grade 1 distal esophagitis,      otherwise normal.   CONSULTATIONS:  Dr. Charna Elizabeth, gastroenterologist.  Dr Antonietta Breach, psychiatrist.   ADMISSION HISTORY:  As in H&P notes of July 10, 2005.  However, in  brief, this is a 25 year old female, with past medical history significant  for hypertension and polysubstance abuse, who presents with abdominal pain  on and off for approximately one month, albeit, of 5 days duration at this  presentation.  She describes this pain as centrally located.  Has had a  month long history of  vomiting in the past without any hematemesis.  In  addition, she complains of bilateral flank pain starting the day prior to  admission.  Patient denies diarrhea or urinary frequency.  She had presented  to the emergency department at [facility] a couple of  times in the preceding week and was discharged from the emergency  department.  Because of persistent symptoms, the patient was admitted for  evaluation, investigation and management.   CLINICAL COURSE:  1.  Abdominal pain.  Patient presents with abdominal pain, which is somewhat      nonspecific in character.  Apparently, in August 2006, she had had an      abdominal CT scan which showed no acute finding.  In addition, she      underwent an abdominal ultrasound examination on July 07, 2005,      again no acute findings noted.  It was thought at this point that the  patient was best served by having gastroenterology input.  A      consultation was called, which was kindly provided by Dr. Charna Elizabeth      who performed an upper GI endoscopy on July 12, 2005, which was      positive only for grade 1 esophagitis.  HIDA scan was done on July 13, 2005, which showed no evidence of cholecystitis.  Small bowel follow      through on July 15, 2005, was also entirely normal.  The patient      was managed with prn analgesics and proton pump inhibitor, with complete      resolution of symptomatology.  Amylase and lipase levels were within      normal limits at 29 and 21 respectively.  Pregnancy test was negative.      LFTs were also within normal limits.  It was felt that patient's      abdominal pain was likely nonspecific, and may even be a form of      nonulcer dyspepsia.   1.  Urinary tract infection/pyelonephritis.  Patient presents with a 2 day      history of bilateral flank pain.  There was no fever urinary symptoms,      however, she did  have a positive urinary sediment.  She was,  therefore,      treated with a course of ciprofloxacin.  Repeat urinalysis on July 16, 2005, was negative.  Total course of ciprofloxacin, was 7 days.   1.  Polysubstance abuse.  Urine drug screen was positive for barbiturates,      benzodiazepines and THC.  Dr. Antonietta Breach was called on psychiatric      consultation to see patient and he has counseled the patient and      recommended that patient follow-up with [facility] on discharge.   1.  Hypertension.  Patient has a known history of hypertension.  She was      adequately managed with Catapres TTS patch and was normotensive during      the course of her hospital stay.   DISPOSITION:  Patient was discharged in satisfactory condition on July 16, 2005.   ACTIVITY:  As tolerated.   DIET:  No restrictions.   WOUND CARE:  Not applicable.   FOLLOWUP INSTRUCTIONS:  As patient has no P.M.D., she has been strongly  urged to establish a primary M.D. with [facility], to be seen within one  week.  She has been supplied with the appropriate phone number.  In  addition, she has also been instructed to follow-up with [facility] for counseling,  etc.  All of this has been communicated to patient.  She has been given  appropriate phone numbers and she has verbalized understanding.      Isidor Holts, M.D.  Electronically Signed     CO/MEDQ  D:  07/17/2005  T:  07/18/2005  Job:  [id number]

## 2011-03-19 NOTE — Discharge Summary (Signed)
[name], [name]             ACCOUNT NO.:  [id number]   MEDICAL RECORD NO.:  [id number]          PATIENT TYPE:  INP   LOCATION:  5501                         FACILITY:  [facility]   PHYSICIAN:  Elliot Cousin, M.D.    DATE OF BIRTH:  20-Jun-1986   DATE OF ADMISSION:  08/05/2005  DATE OF DISCHARGE:  08/10/2005                                 DISCHARGE SUMMARY   DISCHARGE DIAGNOSES:  1.  Recurrent abdominal pain, nausea and vomiting:  The patient has been      admitted on August19,2006, September10,2006, July 26, 2005, and on      October5,2006 for recurrent abdominal pain, nausea and vomiting.  2.  CT scan of the abdomen and pelvis August6,2006 revealed no acute      findings, right renal cyst and focal fat in the left hepatic lobe and no      acute pelvic findings. Ultrasound of the abdomen on September6,2006      revealed borderline splenomegaly with the spleen mildly enlarged at 14      cm. Small cyst right kidney upper pole. On August10,2006, the CT scan of      the abdomen and pelvis revealed stable renal hypodensity, borderline      splenomegaly without significant change, 3 cm left ovarian cyst and 2.7      cm right ovarian cyst. Normal appendix. HIDA scan on September12,2006      revealed no evidence for acute cholecystitis. Normal gallbladder      ejection fraction at 81%. Small bowel follow-through on September14,2006      revealed an unremarkable exam. Esophagogastroduodenoscopy on      September11,2006 per Dr. Loreta Ave revealed only grade 1 distal esophagitis.      No other abnormalities. October6,2006, acute abdominal series revealed      no acute abnormalities.   1.  Somatoform disorder.  2.  Hypokalemia.  3.  History of polysubstance abuse.  4.  Hypertension.  5.  Obesity.  6.  History of pyelonephritis in the past.  7.  History of nausea and vomiting at [age] years old associated with the      patient's mother dying in a motor vehicle accident.   DISCHARGE  MEDICATIONS:  1.  Reglan 5 milligrams 1 tablet q.a.c. q.h.s.  2.  Levsin 0.125 milligrams b.i.d.  3.  Clonidine patch 0.2 milligrams apply every 7 days until you run out.      When you complete the course of the Catapres patch, then start taking      clonidine 0.2 milligrams half a tablet b.i.d.  4.  Protonix 40 milligrams daily.  5.  Potassium chloride 40 mEq 1 tablet daily for 3 days and then to half a      tablet daily thereafter.  6.  Tylenol 650 milligrams q.4-6h. p.r.n. pain.   DISCHARGE DISPOSITION:  The patient was discharged home in improved and  stable condition on October10,2006. Once again, she was advised to follow up  [facility].   HISTORY OF PRESENT ILLNESS:  The patient is a 25 year old Caucasian lady who  was  recently discharged from [facility] on September28,2006 for  abdominal pain, nausea and vomiting. The patient has had three prior  hospitalizations for the same. Once again on October5,2006, the patient  presented to the emergency department with a chief complaint of abdominal  pain and vomiting. The patient stated that she went out to eat at [facility]  [place] consumed a pasta dish. Shortly thereafter, she felt nauseated and  cold and began to regurgitate her food. Following the episode of emesis, she  had multiple repeated episodes totaling more than 20 episodes of emesis  during the night prior to hospital admission. The patient was once again  admitted for further evaluation and management.   HOSPITAL COURSE:  Problem 1:  ABDOMINAL PAIN, NAUSEA AND VOMITING/POSSIBLE  SOMATIZATION: As stated above, the patient has had multiple radiographic  studies as well as an EGD in the past to evaluate the recurrent abdominal  pain, nausea and vomiting. On admission, an acute abdominal series revealed  no acute abnormalities. The patient was started on treatment with IV fluids,  Zofran, Protonix and as needed Dilaudid for pain. An  urinalysis was ordered  and revealed a trace of leukocytes, greater than 80 ketones, and 0 to 2  WBCs. The patient was started empirically on Cipro; however, after 2 days it  was discontinued when the patient had no complaints of dysuria. The patient  was kept n.p.o. for the first 24 hours. However, her diet was advanced to  clear liquids. Subsequently, the Dilaudid was discontinued given its  propensity to call his gastroparesis. The patient did have complaints of  constipation prior to hospital admission. The patient was started on  treatment with Toradol as needed for pain as well as Tylenol. The laxatives  and suppositories were ordered for the patient, however, she refused.  Carafate was added several days later. However, given the constipating  nature of Carafate, the Carafate was discontinued 2 days later. Levsin was  subsequently started for possible irritable bowel syndrome. The patient had  been prescribed Levsin prior to hospital discharge in September. She could  not afford Levsin and therefore did not continue the course of treatment. In  addition to the Levsin, Reglan was added at 10 milligrams IV q.6h.   Eventually, the patient's abdominal pain, nausea and vomiting subsided. Her  diet was advanced to a full liquid diet. She had no complaints of abdominal  pain at the time of hospital discharge. The patient was questioned about  stressors, depression, and anxiety. She was specifically asked about her  relationship with her father and her family. The patient denied depression  and she denied any stressors at home. However, per the nursing staff, the  patient was overheard yelling at her father when he came to visit. Given the  frequency of the patient's hospital admissions with no significant organic  reason for her symptoms, it was felt that the patient had a psychosomatic  component. Therefore, Dr. Jeanie Sewer, psychiatrist, was consulted for further evaluation and management.  Per Dr. Jeanie Sewer, the patient has possible  somatoform disorder; however, it was somewhat unclear. However, given her  demeanor, it was felt that the patient had a strong psychosomatic component  to her symptoms.  Per Dr. Jeanie Sewer, there was no risk of harm to herself  and she had no major depression at the time of his assessment.  In addition to the lab studies above, the patient's amylase and lipase were  both within normal limits and her urine pregnancy test was  negative.   Problem 2:  HYPOKALEMIA:  On admission, the patient's serum potassium was  3.7. However, during the course of the hospitalization, her potassium fell  to 3.3 and then 2.7. The magnesium level was assessed and was within normal  limits at 2.3. The patient was repleted with potassium chloride IV and by  mouth. At the time of hospital discharge, her serum potassium was 3.9. The  patient was advised to continue potassium chloride treatment for an  additional 7-10 days.   Problem 3:  POLYSUBSTANCE ABUSE:  The patient has a history of marijuana and  Ecstasy drug use. She adamantly denied using ectasy recently. She stopped 4  months ago per her account. The patient also denied marijuana use; however,  the urine drug screen on admission was positive for THC. The patient was  advised to seek help for her polysubstance abuse. This was reinforced by  psychiatrist Dr. Jeanie Sewer.   Problem 4:  HYPERTENSION:  The patient's blood pressures were well  controlled on clonidine 0.2 milligrams half a tablet b.i.d. The patient has  left over Catapres patches at home and she was advised to continue to use  those until completed. When she runs out of the Catapres patches, she was  advised to start clonidine 0.2 milligrams half a tablet b.i.d.      Elliot Cousin, M.D.  Electronically Signed     DF/MEDQ  D:  08/10/2005  T:  08/10/2005  Job:  [id number]

## 2011-03-19 NOTE — Op Note (Signed)
[name], [name]             ACCOUNT NO.:  [id number]   MEDICAL RECORD NO.:  [id number]          PATIENT TYPE:  INP   LOCATION:  5729                         FACILITY:  [facility]   PHYSICIAN:  Anselmo Rod, M.D.  DATE OF BIRTH:  1986/02/27   DATE OF PROCEDURE:  07/12/2005  DATE OF DISCHARGE:                                 OPERATIVE REPORT   PROCEDURE:  Esophagogastroduodenoscopy.   ENDOSCOPIST:  Charna Elizabeth, M.D.   INSTRUMENT USED:  Olympus video panendoscope.   INDICATIONS FOR PROCEDURE:  25 year old white female with a history of poly-  substance abuse, nausea, vomiting, abdominal pain, rule out peptic ulcer  disease, esophagitis, gastritis, etc.   PREPROCEDURE PREPARATION:  Informed consent was obtained from the patient.  The patient was fasted for eight hours prior to the procedure.   PREPROCEDURE PHYSICAL:  Patient with stable vital signs.  Neck supple.  Chest clear to auscultation.  S1 and S2 regular.  Abdomen soft with normal  bowel sounds.   DESCRIPTION OF PROCEDURE:  The patient was placed in the left lateral  decubitus position, sedated with 100 mg of Demerol and 10 mg Versed in slow  incremental doses.  The patient received 25 mg Phenergan prior to the  procedure as well as she was having trouble with nausea and vomiting.  Once  the patient was adequately sedated, maintained on low flow oxygen and  continuous cardiac monitoring, the Olympus video panendoscope was advanced  through the mouth piece over the tongue into the esophagus under direct  vision.  The proximal esophagus appeared normal.  There was grade 1-2  esophagitis, the rest of the exam was normal.  The entire gastric mucosa of  the proximal small bowel appeared normal.  Retroflexion in the high cardia  revealed no abnormalities.   IMPRESSION:  Grade 1 distal esophagitis, otherwise, normal EGD.   RECOMMENDATIONS:  1.  Proceed with a HIDA scan with CCK injection in the morning.  Further  recommendations will be made thereafter.  2.  Low fat diet has been recommended for now.      Anselmo Rod, M.D.  Electronically Signed    JNM/MEDQ  D:  07/12/2005  T:  07/12/2005  Job:  [id number]   cc:   Mallory Shirk, MD

## 2011-03-19 NOTE — H&P (Signed)
NAMEDEBBRAH, [name]             ACCOUNT NO.:  [id number]   MEDICAL RECORD NO.:  [id number]          PATIENT TYPE:  EMS   LOCATION:  ED                           [facility]ACILITY[facility]  [facility]   PHYSICIAN:  Mobolaji B. Bakare, M.D.DATE OF BIRTH:  May 19, 1986   DATE OF ADMISSION:  07/26/2005  DATE OF DISCHARGE:                                HISTORY & PHYSICAL   CHIEF COMPLAINT:  Nausea and vomiting with epigastric pain for three days.   HISTORY OF PRESENT ILLNESS:  Ms. [name] is a 25 year old Caucasian female  who was recently discharged from the hospital for similar complaints.  She  was extensively evaluated with abdominal CT scan, hepatobiliary scan and  small-bowel follow-through, upper endoscopy.  The only remarkable finding  was grade 1 distal esophagitis.  She was treated symptomatically and  discharged home with nonresolution of symptoms.  Three days ago she had a  [place] dish of salsa and she subsequently developed epigastric pain, nausea  and vomiting.  She has been unable to keep anything down since then.  Every  time she attempts eating she develops pain and nausea with vomiting.  She  has been unable to keep p.o. medications down and at the same time the  rectal suppository antinausea is not helping her and she came to the  emergency room.  She was given IV fluid and Zofran.  She feels better now  however, she is still unable to tolerate p.o.  She denies fever, diarrhea.  She moved her bowels three days ago.   REVIEW OF SYSTEMS:  No cough, chest pain, shortness of breath, headaches, no  dysuria, urgency, fever, chills.   PAST MEDICAL HISTORY:  1.  Hypertension.  She was diagnosed about a year ago.  2.  History of nausea and vomiting at the age of 8/5 years which required      hospitalization.  This was around the time her mother passed away.  3.  Polysubstance abuse.  4.  History of pyelonephritis.   PAST SURGICAL HISTORY:  Tonsillectomy and adenoidectomy.   MEDICATIONS:  1.   Catapres patch 2 mg q. Week.  2.  Protonix 1 tablet daily.  3.  Vicodin p.r.n.  4.  Anti-nausea suppository, she cannot recall the name.   ALLERGIES:  No known drug allergies.   FAMILY HISTORY:  No known family illnesses that she is aware of.  She lives  with her father.  Her mother passed away when she was [age] years old.   SOCIAL HISTORY:  She used to use ecstasy and marijuana.  She quit about six  weeks ago.  She denies cocaine or IV drug abuse.  She denies alcohol and  cigarette smoking.   PHYSICAL EXAMINATION:  Initial vitals, temperature 98.8, blood pressure  169/125.  This was rechecked and it was 152/90.  Pulse rate 81, respiratory  rate 18.  O2 saturation of 95%.   On examination she was met sitting up in bed and not in any painful  distress.  She appeared comfortable.  Normocephalic, atraumatic head, not  pale, anicteric.  No carotid bruit.  No elevated JVD.  She is morbidly  obese.  Lungs: Clear to auscultation.  Cardiovascular: S1 and S2, regular,  no murmur, no gallop.  Abdomen: Not distended, obese, soft and tender in the  epigastrium, no rebound, no guarding.  Bowel sounds present.  Extremities:  No pedal edema.  No calf tenderness.  CNS: Nonfocal neurological deficit.   INITIAL LABORATORY DATA:  White cells 10.8, hemoglobin 14.6, hematocrit  41.3, platelets 342, neutrophils 79%, lymphocytes 16%.  Sodium 122,  potassium 3.1, chloride 97, bicarb 23, glucose 88, BUN 9, creatinine 0.7.  Bilirubin 1.5, alkaline phosphatase 60, AST 18, ALT 26.  __________ ,  albumin 4.4, calcium10, lipase 21.  Pregnancy test negative.  Urinalysis  moderate bilirubin, ketones greater than 80, total protein 100, specific  gravity 1.028.  Microscopy insignificant.   ASSESSMENT AND PLAN:  Ms. [name] is a 25 year old Caucasian female who  presents with a history of distal esophagitis and previous extensive workup  for intractable nausea and vomiting and epigastric pain.  She developed   recurrence of symptoms after having had a spicy meal.  She was found to have  hypokalemia, hyponatremia and ketonuria.   1.  Intractable nausea and vomiting.  We gave Zofran 8 mg IV q.8 h. Around      the clock for 72 hours, used Phenergan 12.5 mg IV p.r.n., normal saline      plus 20 mEq of potassium chloride at 125 mL per hour.  2. Epigastric      pain unrelated to the distal esophagitis or acute gastritis.  We gave      Protonix 40 mg IV q.12, Dilaudid 1-2 mg IV q.4 h. p.r.n. for severe pain      and oxycodone 5 mg p.o. q.4 h. p.r.n. for mild to moderate pain.  3.      Hypertension with proteinuria.  The patient is unable to keep p.o.  We      will continue with Catapres patch 2 mg q. week and use hydralazine 10 mg      IV q.4 h. p.r.n.  We will consider starting an ACE, albuterol or ARB      when the patient can tolerate p.o.  4. Hypokalemia.  We will replete      with four rounds of potassium chloride and add potassium to IV fluid.      5. Obesity, weight loss counseling.      Mobolaji B. Corky Downs, M.D.  Electronically Signed     MBB/MEDQ  D:  07/26/2005  T:  07/27/2005  Job:  [id number]   cc:   Anselmo Rod, M.D.  Fax: [phone]

## 2011-03-19 NOTE — H&P (Signed)
NAMERYLYN, ZAWISTOWSKI             ACCOUNT NO.:  [id number]   MEDICAL RECORD NO.:  [id number]          PATIENT TYPE:  INP   LOCATION:  0103                         FACILITY:  [facility]   PHYSICIAN:  Sherin Quarry, MD      DATE OF BIRTH:  1985/12/26   DATE OF ADMISSION:  06/18/2005  DATE OF DISCHARGE:                                HISTORY & PHYSICAL   [name] is a 25 year old lady who states that two weeks ago she  suddenly began developing persistent nausea and vomiting, both with eating  and at times when she was not eating. This was associated with cramping or  aching in the epigastric area. There was no hematemesis. Initially, she had  no bowel function. After several days, she began to experience diarrhea. For  the last week, she has had profuse loose stools without associated  hematochezia or melena. She estimates that she has lost about 20 pounds. Two  weeks ago when this first started, she was admitted to [facility]. She was in the hospital for three days. While in the hospital, she  received IV fluids. She was diagnosed with a urinary tract infection and was  given three days of oral antibiotics for this problem. She was discharged  and then presented to [facility] emergency room. In the emergency room, she  was given intravenous fluids. She was not admitted at that time.  Unfortunately, she has continued to vomit and have diarrhea. She has become  progressively weaker and now notes that when she stands up she feels that  she is going to faint. When she presented to the emergency room, an effort  was made to do orthostatic blood pressure check. When the patient was  supine, her blood pressure is 115/72. When she stood up, her heart rate  would go up to 120, and Dr. Lynelle Doctor reports that she could not determine the  blood pressure or feel the patient's pulse.   Laboratory studies obtained in the emergency room have included a urine  pregnancy test which was  negative. A urinalysis which was notable for 3 to 6  white cells and many bacteria. A BMET which showed sodium 131, potassium  3.2; the creatinine was 2.1, BUN was 42. The patient was judged to be  extremely dehydrated and therefore was admitted.   PAST MEDICAL HISTORY:  Past history is significant for several  hospitalizations during her childhood for nausea and vomiting. These were  principally when she was 4 and 5 years. This was around the time that her  mother died in a motor vehicle accident, and her vomiting problem was  attributed to a nervous stomach. She does not think that she ever had any  gastrointestinal studies done, and she has never been advised that she has  inflammatory bowel disease. Two other concerns are:  (1) Hypertension. The  patient states that in the past she had been told on several occasions that  her blood pressure is higher than is desirable. When she was [facility], her blood pressure got as high as 170/100, and she was placed on  Lotensin 10 mg daily and hydrochlorothiazide 25 mg daily. She has been  taking these medicines ever since. (2) Concern about sexually transmitted  disease. The patient states that she uses ecstasy and smokes marijuana  frequently. She has been sexually active with several different men and is  concerned about acquiring a sexually transmitted disease. She requests that  we do HIV testing.   CURRENT MEDICATIONS:  1.  Lotensin 10 mg daily.  2.  Hydrochlorothiazide 25 mg daily.   ALLERGIES:  There are no known drug allergies.   OPERATIONS:  The patient cannot recall having had any operations.   MEDICAL ILLNESSES:  Medical illness are as described above.   FAMILY HISTORY:  The patient's father goes to doctors frequently, but she  does not know what his health problems are. She has a brother and sister who  are in good health. As mentioned previously, her mother died in a motor  vehicle accident.   SOCIAL HISTORY:  She  does not smoke. She says that she does not abuse  alcohol. Drug use history is described above.   REVIEW OF SYSTEMS:  HEAD:  She denies headache or dizziness. EYES:  She  denies visual blurring or diplopia. EARS, NOSE, THROAT:  Denies earache,  sinus pain or sore throat. CHEST:  Denies coughing, wheezing or chest  congestion. CARDIOVASCULAR:  Denies orthopnea, PND or ankle edema.  GASTROINTESTINAL:  See above. GENITOURINARY:  See above. GYN:  Her last  menstrual period was one week ago. NEUROLOGICAL:  There is no history of  seizure or stroke. ENDOCRINE:  She denies excessive thirst, urinary  frequency or nocturia.   PHYSICAL EXAMINATION:  GENERAL:  On physical exam, the patient is an acutely  ill-appearing woman who is quite obese.  VITAL SIGNS:  Her temperature is 97.2, blood pressure 126/72 supine, pulse  87, respirations 18.  HEENT:  Exam is within normal limits.  CHEST:  The chest is clear.  BACK:  Examination the back reveals no CVA or point tenderness.  CARDIOVASCULAR:  Reveals normal S1-S2. There are no rubs, murmurs or  gallops. ABDOMEN:  The abdomen is benign. Bowel sounds are vigorous. There  is no guarding or rebound. No masses were appreciated.  NEUROLOGIC:  Testing is within normal limits.  EXTREMITIES:  Examination of the extremities is within normal limits.   IMPRESSION:  1.  Profound dehydration as manifested by elevated BUN and creatinine.  2.  Diarrhea for five days. Nausea and vomiting for two weeks with 20 pound      weight loss.  3.  History of hypertension.  4.  History of urinary tract infection.  5.  Concern regarding pregnancy or sexually transmitted disease.   PLAN:  The plan will be to administer intravenous fluids. We will  discontinue blood pressure medications. Stools will be obtained for ova and  parasites, enteric pathogens and white blood cells. A CT scan of the abdomen  and pelvis will be obtained to evaluate the bowel. This would be  a reasonable way to screen for inflammatory bowel disease. Will obtain a urine  culture. A urine pregnancy test has been done and is negative. The patient  requests that we check RPR and HIV serologies, and we will do this.  Hopefully, her symptoms will improve with treatment.           ______________________________  Sherin Quarry, MD     SY/MEDQ  D:  06/19/2005  T:  06/19/2005  Job:  [id number]

## 2011-03-19 NOTE — Consult Note (Signed)
NAMEOYINKANSOLA, TRUAX             ACCOUNT NO.:  [id number]   MEDICAL RECORD NO.:  [id number]          PATIENT TYPE:  INP   LOCATION:  5734                         FACILITY:  [facility]   PHYSICIAN:  Anselmo Rod, M.D.  DATE OF BIRTH:  Apr 30, 1986   DATE OF CONSULTATION:  DATE OF DISCHARGE:                                   CONSULTATION   DATE OF GI CONSULTATION:  July 13, 2005.   PHYSICIAN:  Anselmo Rod, MD.   CONSULTING PHYSICIAN:  Michaelyn Barter, MD.   REASON FOR CONSULTATION:  Evaluation for abdominal pain, nausea, and  vomiting.   HISTORY OF PRESENTING ILLNESS:  Ms. Shana Chute is a 25 year old female with a  past medical history of hypertension, and persistent abdominal pain on and  off for one month.  The pain is located in the central area, 10/10 in  intensity, nonradiating, no aggravating or relieving factors, associated  with nausea and vomiting, no blood in the emesis.  She vomits as soon as she  eats.  No history of diarrhea, heartburn, jaundice, or use of NSAIDs.  No  history of odynophagia, no history of melena or bright red blood per rectum.  No constipation.  She has visited hospitals eight times.  Last Wednesday,  she went to [facility] and was given pain medicine and Maalox and sent  home.  On Friday, she went to [facility] and was given a ?Phenergan  suppository and sent home.  This episode of pain, vomiting, and nausea  started five days ago.  At present, she is not able to eat anything and is  still nauseous and has vomited a couple of times.  She has not moved her  bowels but is passing gas.   REVIEW OF SYSTEMS:  No chest pain, shortness of breath, fever, cough, rash,  headaches, seizures, or swelling of the limbs.   PAST MEDICAL HISTORY:  1.  Hypertension.  2.  Urinary tract infection.   PAST SURGICAL HISTORY:  None.   HOME MEDICATIONS:  Lotensin 10 mg once daily.   FAMILY HISTORY:  Mother died in a motor vehicle accident.  Siblings in good  health.   ALLERGIES:  No known allergies.   SOCIAL HISTORY:  Drank alcohol a week ago.  Denies smoking.  Denies drugs.   PHYSICAL EXAMINATION:  VITAL SIGNS:  Temperature 98.8, pulse 77, respiratory  rate 20, BP 107/65, saturation of 02 93%.  GENERAL:  The patient is lying down in the bed and sleeping.  She wakes up  and is well oriented.  HEAD, EYE, & ENT:  Atraumatic, normocephalic.  Eye examination normal.  Throat examination normal.  Tongue pierced.  ABDOMEN:  Bowel sounds are present.  Tenderness in the right upper quadrant  and the epigastric area.  Murphy's sign negative.  Nondistended.  CARDIOVASCULAR:  S1 and S2 present.  Regular rate and rhythm.  No murmurs,  rubs, and gallops.  RESPIRATORY:  Clear to auscultation bilaterally.  CNS:  Oriented x3.  Motor and sensory system intact.   LABORATORY DATA:  Hemoglobin 14.6, hematocrit 41.5, WBC 11.0, platelets 207.  Sodium 138, potassium  2.8, chloride 104, bicarb 31, BUN 12, creatinine 0.8,  and sugar of 89.  Bilirubin 1.2, alk-phos 62, AST 17, ALT 24, total protein  6.8, albumin 4.0, calcium 9.5, amylase 29, lipase 21.  Urine drug screen  positive for barbiturates, benzodiazepines, and THC.  Urine pregnancy test  negative.  Urine nitrite trace and leukocytes trace.  CT abdomen shows a  renal hypodensity, mild splenomegaly, cyst in both ovaries, and a normal  appendix.  Ultrasound shows a splenomegaly and a cyst in the right kidney.   ASSESSMENT AND PLAN:  1.  This 25 year old female with central abdominal pain for one month      associated with nausea and vomiting.  Possibilities include esophagitis      and gallbladder disease.  Labs show a negative pregnancy test.  CT      abdomen, pelvis, and ultrasound do not show any gallbladder pathology or      abdominal pathology.  She does not have pancreatitis.  Her hemoglobin is      stable.  At this point of time, we will plan to do an EGD, and we may      need a HIDA scan if the EGD  is negative.  2.  Polysubstance abuse:  Needs counseling.  3.  Hypokalemia:  Being replaced.  4.  UTI:  On ciprofloxacin.  5.  Hypertension:  Being treated with Clonidine.      Ronda Fairly, M.D.      Anselmo Rod, M.D.  Electronically Signed    YB/MEDQ  D:  07/13/2005  T:  07/13/2005  Job:  [id number]   cc:   Anselmo Rod, M.D.

## 2011-03-19 NOTE — H&P (Signed)
[name], [name]             ACCOUNT NO.:  [id number]   MEDICAL RECORD NO.:  [id number]          PATIENT TYPE:  INP   LOCATION:  1827                         FACILITY:  [facility]   PHYSICIAN:  Michaelyn Barter, M.D. DATE OF BIRTH:  06-Mar-1986   DATE OF ADMISSION:  07/10/2005  DATE OF DISCHARGE:                                HISTORY & PHYSICAL   PRIMARY CARE PHYSICIAN:  Unassigned.   CHIEF COMPLAINT:  Stomach pains.   HISTORY OF PRESENT ILLNESS:  Ms. Wojdyla is a 25 year old female with past  medical history of hypertension, who states that she has been experiencing  stomach pain off and on for approximately one month.  She states that her  current episode of abdominal pain began approximately five days ago, during  which time she developed centrally located pain.  She has been vomiting  multiple times since the onset of the pain.  There has been no blood in her  emesis.  Foods and liquids consumed are regurgitated.  Her pain is described  as sharp, constant, and 10/10 in intensity.  It does not radiate.  She has  also developed a separate bilateral flank pain since yesterday.  She denies  having any dysuria,. but states that her urine has developed an odor.  She  denies any increased urinary frequency.  With regards to her abdominal pain  that is centrally located, she states that drinking and eating aggravates  her pain.  The emesis occasionally makes the pain better.  No diarrhea.  The  patient has visited the hospital multiple times this past week, stating that  she went to [facility] emergency on Wednesday of this week at which time  she received pain medication and IV fluids and was sent home.  The next day  she came back to [facility] and received a shot in her buttock  region along with Maalox and was sent home.  Yesterday, she went to [facility]  and was given two suppositories and sent home.  She denies having any fevers  or chills.   PAST MEDICAL HISTORY:  1.  Hypertension.  2.  Abdominal pain.  The patient has recently undergone an abdominal      ultrasound on July 07, 2005.  The final impression being      splenomegaly along with small cysts within the right kidney upper pole.      Likewise the patient also had a CT scan of the abdomen completed on      June 19, 2005 the final impression of which was no acute pelvic      findings, no evidence of appendicitis but again there was a right renal      cyst and focal fat in the left hepatic lobe observed.  3.  Bladder infection.   ALLERGIES:  No known drug allergies.   HOME MEDICATIONS:  1.  Lotensin 10 mg once a day.  2.  The patient states that she was given an antibiotic last month, however      she does not remember the name of the antibiotic and states that she has  not taken all of the medication secondary to the episodes of emesis.      She was supposed to take the medication over the course of one week,      however the patient states that she still has some tablets remaining.   FAMILY HISTORY:  Father has illnesses that the patient is not aware of their  names.  Mother died secondary to a motor vehicle accident.   SOCIAL HISTORY:  Cigarettes:  The patient denied.  Alcohol:  The patient  initially denied but states that she recently drank alcohol a week or so  ago.  Cocaine:  The patient denies.  Marijuana:  Positive.  The last time  the patient used it was six days ago.  Ecstasy:  The patient states that the  last time she used this was one month ago.   REVIEW OF SYSTEMS:  As per HPI.  Otherwise all other systems are negative.   PHYSICAL EXAMINATION:  GENERAL:  The patient looks somewhat uncomfortable.  However, she is cooperative.  VITALS:  Temperature 97.5, blood pressure 153/112, heart rate 107, RR 22,  Sat 97 on room air.  HEENT:  Normocephalic.  Extraocular movements are intact.  Pupils are  equally reactive to light.  No oral thrush.  No exudates.  NECK:   Supple.  No thyromegaly, no lymphadenopathy.  CARDIAC:  S1 and S2 is present.  Regular rate and rhythm.  RESPIRATORY:  Lungs are clear.  ABDOMEN:  Soft.  There is diffuse tenderness across the upper regions of the  patient's abdomen distributed across the entire upper region of her belly.  There is no rebound, there is no guarding.  Bowel sounds are hypoactive.  There is no palpable organomegaly.  EXTREMITIES:  No edema.  NEUROLOGIC:  The patient is alert and oriented x3.  MUSCULOSKELETAL:  5/5 upper and lower extremity strength.   LABS:  A urine drug screen was positive for barbiturates, positive for  benzodiazepines, positive for tetrahydro cannabinoid.  Urinalysis reveals  moderate bilirubin, ketones 15, nitrites positive, leukocytes trace.   ASSESSMENT/PLAN:  Problem 1. Pyelonephritis.  Will continue IV antibiotics  which were initiated in the emergency room.  Will also order a urine culture  and will provide p.r.n. pain medication.   Problem 2.  Abdominal pain.  The etiology of this is questionable but the  differential includes peptic ulcer disease, gastritis, and pancreatitis.  Will check amylase, lipase, comprehensive metabolic profile.  Will also  order CT scan of the abdomen with contrast and given the fact that the  patient has been in the hospital three prior times this week, a GI  consultation is warranted at this particular time.  The patient may need an  EGD.  Will provide Protonix 40 mg IV.   Problem 3. Intractable nausea and emesis.  This is most likely secondary to  both #1 and 2 above.  Will provide Phenergan p.r.n. and will provide  aggressive IV fluid hydration.   Problem 4. History of hypertension.  The patient's blood pressure is  uncontrolled currently.  This is more than likely secondary to her  noncompliance with her antihypertensive medications secondary to her emesis. Will reinstitute her home medications and will provide p.r.n. IV  antihypertensive  medications if needed.      Michaelyn Barter, M.D.  Electronically Signed     OR/MEDQ  D:  07/11/2005  T:  07/11/2005  Job:  [id number]

## 2011-03-19 NOTE — H&P (Signed)
[name], [name] NO.:  [id number]   MEDICAL RECORD NO.:  [id number]          PATIENT TYPE:  EMS   LOCATION:  MAJO                         FACILITY:  [facility]   PHYSICIAN:  Michaelyn Barter, M.D. DATE OF BIRTH:  [birth year]   DATE OF ADMISSION:  08/05/2005  DATE OF DISCHARGE:                                HISTORY & PHYSICAL   PRIMARY CARE PHYSICIAN:  [facility].  Therefore, the patient is  unassigned.   CHIEF COMPLAINT:  Stomach pain and vomiting.   HISTORY OF PRESENT ILLNESS:  [name] is a 25 year old female recently  discharged from [facility] following a hospitalization that took  place July 26, 2005, until July 29, 2005.  At that time, she was  treated for abdominal pain accompanied by nausea and vomiting.  Likewise,  she has had multiple prior, recent admissions to the hospital for the same  chief complaint and she has also had an extensive workup over the past  several weeks, regarding her symptoms.  She went on to state that last  night, she went to the [employer] and consumed their pasta.  Shortly  afterward, she felt nauseated and cold, and began to regurgitate her food.  Following that episode of emesis, she has had multiple repeat episodes and  states that she has had at least 20 episodes of emesis since last night.  She has developed abdominal pain along with this.  Her pain is localized  centrally to the abdominal region and it does not travel to the back or any  other place.  She states that her symptoms are the same as when she was  treated at the hospital before.  She took her medications for her nausea;  however, she threw them back up.   PAST MEDICAL HISTORY:  1.  Multiple recent admissions for abdominal pain associated with nausea and      emesis.  2.  History of distal esophagitis.  3.  Hypokalemia.  4.  Hypertension.  5.  Polysubstance abuse.  6.  Recent treatment for a pyelonephritis.   RECENT  WORKUP/DATA:  1.  Recent past workup includes abdominal/pelvic CT dated July 11, 2005, which showed stable renal hypodensity, borderline splenomegaly not      significantly changed.  Cysts on ovaries 2.7 and 3 cm on the right and      left ovaries, respectively.  Gallbladder, pancreas and adrenal glands      are within normal limits.  2.  Hepatobiliary scan with gallbladder ejection fraction dated July 13, 2005, which showed no evidence of acute cholecystitis, normal      gallbladder, ejection fraction at 81%.  3.  Small-bowel follow-through July 15, 2005, which was unremarkable.  4.  Dr. Charna Elizabeth performed an EGD on July 12, 2005, which showed a      grade 1 distal esophagitis. Otherwise, reported to be normal.   ALLERGIES:  NO KNOWN DRUG ALLERGIES.   The patient was recently discharged home on the following medications.   MEDICATIONS:  1.  Protonix 40 mg  daily.  2.  Catapres patch 0.2 mg, change patch every 7 days.  3.  The patient was instructed to start clonidine 0.2 tablets b.i.d.  4.  Hyoscyamine 0.25 mg before each meal.  5.  Potassium chloride 20 mEq daily x3 additional days.  6.  Percocet one tablet q.6h. p.r.n. pain.  7.  Phenergan 25 mg one tablet q.4h. p.r.n. nausea.   FAMILY HISTORY:  Mother passed away when the patient was 35 years old.  Father has no illnesses that the patient knows of.   SOCIAL HISTORY:  The patient has a positive history for street drugs  including ectasy and marijuana.   REVIEW OF SYSTEMS:  As per HPI, otherwise all other systems are negative.   PHYSICAL EXAMINATION:  GENERAL:  The patient is awake, she is cooperative  and she looks mildly tired.  VITAL SIGNS:  Temperature 98.1, blood pressure 157/103, heart rate 112,  respirations 20, O2 sat 98%.  HEENT:  Normocephalic, atraumatic.  Extraocular movements are intact.  Pupils are anicteric.  They react equally to light.  Oral mucosa is dry, no  oral thrush or  exudates.  NECK:  The neck is supple, no lymphadenopathy.  The thyroid is not palpable.  CARDIAC:  S1 and S2 are present, regular rate and rhythm.  No S3, no S4.  No  murmurs, gallops or rubs.  RESPIRATORY:  No crackles or wheezes.  ABDOMEN:  Soft, some tenderness centrally located to palpation.  No rebound,  no guarding.  Positive bowel sounds x4 quadrants.  No organomegaly  appreciated.  EXTREMITIES:  No edema.  NEUROLOGICALLY:  The patient is alert and oriented x 3.  Cranial nerves II-  XII are intact.  MUSCULOSKELETAL:  5/5 upper and lower extremity strength.   LABORATORY DATA:  ABG:  pH 7.44, pCO2 31.0, bicarb 21.0.  White blood cell  count 12.4, hemoglobin 14.4, hematocrit 42.2, platelets 300,000.  Sodium  137, potassium 3.7, chloride 107, BUN 4, creatinine 0.7, glucose 136.  Bilirubin total 1.0, bilirubin direct 0.2, indirect bilirubin 0.8, alkaline  phosphatase 56, SGOT 20, SGPT 24, total protein 7.1, albumin 4.3, lipase 15.   ASSESSMENT AND PLAN:  [name] is a 25 year old female with  multiple past medical admissions for evaluation of abdominal pain associated  with nausea and vomiting.  Occasionally this is precipitated by the  consumption of food.  1.  Intractable nausea and emesis.  This may be secondary to gastritis,      which may have been precipitated by foods consumed.  We will      aggressively hydrate the patient for now, will provide antiemetics      around the clock.  2.  Abdominal pain.  Again, this may have been secondary to gastritis.  We      will provide IV Protonix and will order an acute abdominal series of x-      rays.  In addition, we will check a urinalysis and an amylase.  The      lipase, as mentioned, has already been ordered and is reported to be      within normal limits.  3.  Hypertension.  The patient's blood pressure did decrease to 119/61.      Therefore, we will monitor this for now and we will consider re-     institution of  patient's clonidine.  4.  History of esophagitis.  We will again provide Protonix for now.      Michaelyn Barter, M.D.  Electronically  Signed     OR/MEDQ  D:  08/05/2005  T:  08/05/2005  Job:  [id number]   cc:   Anselmo Rod, M.D.  Fax: [phone]

## 2011-06-28 ENCOUNTER — Ambulatory Visit: Payer: Self-pay | Admitting: Internal Medicine

## 2011-07-23 LAB — WET PREP, GENITAL
Trich, Wet Prep: NONE SEEN
Yeast Wet Prep HPF POC: NONE SEEN

## 2011-07-23 LAB — URINALYSIS, ROUTINE W REFLEX MICROSCOPIC
Bilirubin Urine: NEGATIVE
Glucose, UA: NEGATIVE
Hgb urine dipstick: NEGATIVE
Ketones, ur: NEGATIVE
Nitrite: NEGATIVE
Protein, ur: NEGATIVE
Specific Gravity, Urine: 1.028
Urobilinogen, UA: 0.2
pH: 6

## 2011-07-23 LAB — GC/CHLAMYDIA PROBE AMP, GENITAL
Chlamydia, DNA Probe: NEGATIVE
GC Probe Amp, Genital: NEGATIVE

## 2011-07-23 LAB — POCT PREGNANCY, URINE
Operator id: 161631
Preg Test, Ur: NEGATIVE

## 2011-07-30 LAB — URINALYSIS, ROUTINE W REFLEX MICROSCOPIC
Bilirubin Urine: NEGATIVE
Glucose, UA: NEGATIVE
Hgb urine dipstick: NEGATIVE
Ketones, ur: NEGATIVE
Nitrite: NEGATIVE
Protein, ur: NEGATIVE
Specific Gravity, Urine: 1.025
Urobilinogen, UA: 0.2
pH: 5.5

## 2011-07-30 LAB — WET PREP, GENITAL
Trich, Wet Prep: NONE SEEN
Yeast Wet Prep HPF POC: NONE SEEN

## 2011-07-30 LAB — HCG, QUANTITATIVE, PREGNANCY
hCG, Beta Chain, Quant, S: 1143 — ABNORMAL HIGH
hCG, Beta Chain, Quant, S: 382 — ABNORMAL HIGH

## 2011-07-30 LAB — GC/CHLAMYDIA PROBE AMP, GENITAL
Chlamydia, DNA Probe: NEGATIVE
GC Probe Amp, Genital: NEGATIVE

## 2011-07-30 LAB — CBC
HCT: 39.2
Hemoglobin: 13.4
MCHC: 34.3
MCV: 87.4
Platelets: 230
RBC: 4.49
RDW: 13.5
WBC: 10

## 2011-07-30 LAB — POCT PREGNANCY, URINE
Operator id: 129211
Preg Test, Ur: POSITIVE

## 2011-08-03 LAB — URINALYSIS, ROUTINE W REFLEX MICROSCOPIC
Bilirubin Urine: NEGATIVE
Glucose, UA: NEGATIVE
Hgb urine dipstick: NEGATIVE
Ketones, ur: NEGATIVE
Nitrite: NEGATIVE
Protein, ur: NEGATIVE
Specific Gravity, Urine: 1.025
Urobilinogen, UA: 0.2
pH: 6.5

## 2011-08-11 LAB — POCT PREGNANCY, URINE
Operator id: 239701
Preg Test, Ur: POSITIVE

## 2011-08-16 LAB — POCT URINALYSIS DIP (DEVICE)
Bilirubin Urine: NEGATIVE
Glucose, UA: NEGATIVE
Hgb urine dipstick: NEGATIVE
Ketones, ur: NEGATIVE
Nitrite: NEGATIVE
Operator id: 247071
Protein, ur: NEGATIVE
Specific Gravity, Urine: 1.02
Urobilinogen, UA: 1
pH: 6.5

## 2011-08-16 LAB — POCT PREGNANCY, URINE
Operator id: 247071
Preg Test, Ur: NEGATIVE

## 2011-08-19 LAB — DIFFERENTIAL
Basophils Absolute: 0
Basophils Relative: 0
Eosinophils Absolute: 0.2
Eosinophils Relative: 2
Lymphocytes Relative: 10 — ABNORMAL LOW
Lymphs Abs: 0.9
Monocytes Absolute: 0.5
Monocytes Relative: 6
Neutro Abs: 6.9
Neutrophils Relative %: 82 — ABNORMAL HIGH

## 2011-08-19 LAB — COMPREHENSIVE METABOLIC PANEL
ALT: 22
AST: 28
Albumin: 3.9
Alkaline Phosphatase: 81
BUN: 13
CO2: 21
Calcium: 9.4
Chloride: 112
Creatinine, Ser: 0.78
GFR calc Af Amer: >60
GFR calc non Af Amer: >60
Glucose, Bld: 100 — ABNORMAL HIGH
Potassium: 4.3
Sodium: 137
Total Bilirubin: 1.4 — ABNORMAL HIGH
Total Protein: 7.3

## 2011-08-19 LAB — URINE MICROSCOPIC-ADD ON

## 2011-08-19 LAB — RAPID URINE DRUG SCREEN, HOSP PERFORMED
Amphetamines: NOT DETECTED
Barbiturates: NOT DETECTED
Benzodiazepines: NOT DETECTED
Cocaine: NOT DETECTED
Opiates: NOT DETECTED
Tetrahydrocannabinol: POSITIVE — AB

## 2011-08-19 LAB — URINALYSIS, ROUTINE W REFLEX MICROSCOPIC
Bilirubin Urine: NEGATIVE
Glucose, UA: NEGATIVE
Hgb urine dipstick: NEGATIVE
Ketones, ur: NEGATIVE
Leukocytes, UA: NEGATIVE
Nitrite: NEGATIVE
Protein, ur: 30 — AB
Specific Gravity, Urine: 1.036 — ABNORMAL HIGH
Urobilinogen, UA: 0.2
pH: 5.5

## 2011-08-19 LAB — CBC
HCT: 45.4
Hemoglobin: 15.6 — ABNORMAL HIGH
MCHC: 34.3
MCV: 87.2
Platelets: 242
RBC: 5.21 — ABNORMAL HIGH
RDW: 12.6
WBC: 8.4

## 2011-08-19 LAB — LIPASE, BLOOD: Lipase: 29

## 2011-08-19 LAB — AMYLASE: Amylase: 69

## 2011-09-18 ENCOUNTER — Encounter: Payer: Self-pay | Admitting: Emergency Medicine

## 2011-09-18 ENCOUNTER — Emergency Department (HOSPITAL_COMMUNITY)
Admission: EM | Admit: 2011-09-18 | Discharge: 2011-09-18 | Disposition: A | Discharge status: Home / Self Care | Payer: Medicaid Other | Attending: Emergency Medicine | Admitting: Emergency Medicine

## 2011-09-18 ENCOUNTER — Emergency Department (HOSPITAL_COMMUNITY): Payer: Medicaid Other

## 2011-09-18 DIAGNOSIS — R109 Unspecified abdominal pain: Secondary | ICD-10-CM | POA: Insufficient documentation

## 2011-09-18 DIAGNOSIS — R112 Nausea with vomiting, unspecified: Secondary | ICD-10-CM | POA: Insufficient documentation

## 2011-09-18 HISTORY — DX: Gastro-esophageal reflux disease without esophagitis: K21.9

## 2011-09-18 LAB — CBC
HCT: 41.2 % (ref 36.0–46.0)
Hemoglobin: 14.1 g/dL (ref 12.0–15.0)
MCH: 29.8 pg (ref 26.0–34.0)
MCHC: 34.2 g/dL (ref 30.0–36.0)
MCV: 87.1 fL (ref 78.0–100.0)
Platelets: 184 10*3/uL (ref 150–400)
RBC: 4.73 MIL/uL (ref 3.87–5.11)
RDW: 12.9 % (ref 11.5–15.5)
WBC: 9.5 10*3/uL (ref 4.0–10.5)

## 2011-09-18 LAB — COMPREHENSIVE METABOLIC PANEL
ALT: 28 U/L (ref 0–35)
AST: 18 U/L (ref 0–37)
Albumin: 4 g/dL (ref 3.5–5.2)
Alkaline Phosphatase: 72 U/L (ref 39–117)
BUN: 11 mg/dL (ref 6–23)
CO2: 23 mEq/L (ref 19–32)
Calcium: 9.3 mg/dL (ref 8.4–10.5)
Chloride: 106 mEq/L (ref 96–112)
Creatinine, Ser: 0.65 mg/dL (ref 0.50–1.10)
GFR calc Af Amer: >90 mL/min (ref >90)
GFR calc non Af Amer: >90 mL/min (ref >90)
Glucose, Bld: 100 mg/dL — ABNORMAL HIGH (ref 70–99)
Potassium: 4 mEq/L (ref 3.5–5.1)
Sodium: 139 mEq/L (ref 135–145)
Total Bilirubin: 0.6 mg/dL (ref 0.3–1.2)
Total Protein: 7.6 g/dL (ref 6.0–8.3)

## 2011-09-18 LAB — URINALYSIS, ROUTINE W REFLEX MICROSCOPIC
Bilirubin Urine: NEGATIVE
Glucose, UA: NEGATIVE mg/dL
Hgb urine dipstick: NEGATIVE
Ketones, ur: NEGATIVE mg/dL
Leukocytes, UA: NEGATIVE
Nitrite: NEGATIVE
Protein, ur: NEGATIVE mg/dL
Specific Gravity, Urine: 1.031 — ABNORMAL HIGH (ref 1.005–1.030)
Urobilinogen, UA: 1 mg/dL (ref 0.0–1.0)
pH: 5.5 (ref 5.0–8.0)

## 2011-09-18 LAB — DIFFERENTIAL
Basophils Absolute: 0 10*3/uL (ref 0.0–0.1)
Basophils Relative: 0 % (ref 0–1)
Eosinophils Absolute: 0.1 10*3/uL (ref 0.0–0.7)
Eosinophils Relative: 2 % (ref 0–5)
Lymphocytes Relative: 11 % — ABNORMAL LOW (ref 12–46)
Lymphs Abs: 1 10*3/uL (ref 0.7–4.0)
Monocytes Absolute: 0.4 10*3/uL (ref 0.1–1.0)
Monocytes Relative: 4 % (ref 3–12)
Neutro Abs: 8 10*3/uL — ABNORMAL HIGH (ref 1.7–7.7)
Neutrophils Relative %: 84 % — ABNORMAL HIGH (ref 43–77)

## 2011-09-18 LAB — LIPASE, BLOOD: Lipase: 78 U/L — ABNORMAL HIGH (ref 11–59)

## 2011-09-18 MED ORDER — METOCLOPRAMIDE HCL 5 MG/ML IJ SOLN
10.0000 mg | Freq: Once | INTRAMUSCULAR | Status: AC
  Administered 2011-09-18: 10 mg via INTRAVENOUS
  Filled 2011-09-18: qty 2

## 2011-09-18 MED ORDER — SODIUM CHLORIDE 0.9 % IV BOLUS (SEPSIS)
1000.0000 mL | Freq: Once | INTRAVENOUS | Status: AC
  Administered 2011-09-18: 1000 mL via INTRAVENOUS

## 2011-09-18 MED ORDER — PANTOPRAZOLE SODIUM 40 MG IV SOLR
40.0000 mg | Freq: Once | INTRAVENOUS | Status: AC
  Administered 2011-09-18: 40 mg via INTRAVENOUS
  Filled 2011-09-18: qty 40

## 2011-09-18 MED ORDER — FENTANYL CITRATE 0.05 MG/ML IJ SOLN
100.0000 ug | Freq: Once | INTRAMUSCULAR | Status: AC
  Administered 2011-09-18: 100 ug via INTRAVENOUS
  Filled 2011-09-18: qty 2

## 2011-09-18 MED ORDER — ONDANSETRON HCL 4 MG PO TABS: 4.0000 mg | ORAL_TABLET | Freq: Four times a day (QID) | ORAL | Status: AC

## 2011-09-18 MED ORDER — SODIUM CHLORIDE 0.9 % IV BOLUS (SEPSIS)
500.0000 mL | Freq: Once | INTRAVENOUS | Status: AC
  Administered 2011-09-18: 500 mL via INTRAVENOUS

## 2011-09-18 MED ORDER — SODIUM CHLORIDE 0.9 % IV SOLN: 1000.0000 mL | INTRAVENOUS | Status: DC

## 2011-09-18 MED ORDER — FAMOTIDINE IN NACL 20-0.9 MG/50ML-% IV SOLN
20.0000 mg | Freq: Once | INTRAVENOUS | Status: AC
  Administered 2011-09-18: 20 mg via INTRAVENOUS
  Filled 2011-09-18: qty 50

## 2011-09-18 NOTE — ED Notes (Signed)
PT states vomiting started this am at around 0140. Pt states she has had 5 episodes since then and now only vomiting thin yellow fluid.  Pt states she last ate [employer] yesterday around 1800.

## 2011-09-18 NOTE — ED Notes (Signed)
Pt to be given oral trial in anticipation of d/c home.

## 2011-09-18 NOTE — ED Provider Notes (Addendum)
History     CSN: [id number] Arrival date & time: 09/18/2011  9:22 AM   First MD Initiated Contact with Patient 09/18/11 [phone]      Chief Complaint  Patient presents with  . Emesis    (Consider location/radiation/quality/duration/timing/severity/associated sxs/prior treatment) HPI This 25 year old female has over 12 hours of nonbloody vomiting and upper abdominal pain which waxes and wanes but has not resolved completely, she has no diarrhea, no dysuria, no vaginal bleeding or discharge, no chest pain cough or shortness of breath. She also is no fever or rash. She otherwise feels well except for abdominal symptoms. Past Medical History  Diagnosis Date  . GERD (gastroesophageal reflux disease)     No past surgical history on file.  No family history on file.  History  Substance Use Topics  . Smoking status: Not on file  . Smokeless tobacco: Not on file  . Alcohol Use:     OB History    Grav Para Term Preterm Abortions TAB SAB Ect Mult Living                  Review of Systems  Constitutional: Negative for fever.       10 Systems reviewed and are negative for acute change except as noted in the HPI.  HENT: Negative for congestion.   Eyes: Negative for discharge and redness.  Respiratory: Negative for cough and shortness of breath.   Cardiovascular: Negative for chest pain.  Gastrointestinal: Positive for nausea, vomiting and abdominal pain. Negative for diarrhea and blood in stool.  Genitourinary: Negative for dysuria, hematuria, flank pain, vaginal bleeding, vaginal discharge and pelvic pain.  Musculoskeletal: Negative for back pain.  Skin: Negative for rash.  Neurological: Negative for syncope, numbness and headaches.  Psychiatric/Behavioral:       No behavior change.    Allergies  Morphine and related  Home Medications   Current Outpatient Rx  Name Route Sig Dispense Refill  . CETIRIZINE HCL 10 MG PO TABS Oral Take 10 mg by mouth daily.      .  DEXLANSOPRAZOLE 60 MG PO CPDR Oral Take 60 mg by mouth daily.      . OXYCODONE-ACETAMINOPHEN 5-325 MG PO TABS Oral Take 1 tablet by mouth every 6 (six) hours as needed. For pain     . ONDANSETRON HCL 4 MG PO TABS Oral Take 1 tablet (4 mg total) by mouth every 6 (six) hours. 12 tablet 0    BP 128/84  Pulse 89  Temp(Src) 96.9 F (36.1 C) (Oral)  Resp 20  Ht 5\' 6"  (1.676 m)  Wt 250 lb (113.399 kg)  BMI 40.35 kg/m2  SpO2 94%  Physical Exam  Nursing note and vitals reviewed. Constitutional:       Awake, alert, nontoxic appearance.  HENT:  Head: Atraumatic.       Oropharynx is clear mucous membranes are somewhat dry  Eyes: Conjunctivae are normal. Pupils are equal, round, and reactive to light. Right eye exhibits no discharge. Left eye exhibits no discharge.  Neck: Neck supple.  Cardiovascular: Normal rate and regular rhythm.   No murmur heard. Pulmonary/Chest: Effort normal and breath sounds normal. No respiratory distress. She has no wheezes. She has no rales. She exhibits no tenderness.  Abdominal: Soft. Bowel sounds are normal. She exhibits no mass. There is tenderness. There is no rebound and no guarding.       Mild upper abdominal tenderness to the right upper quadrant, epigastrium, and left upper quadrant without rebound tenderness,  back is nontender without CVA tenderness, lower abdomen is nontender.  Musculoskeletal: She exhibits no tenderness.       Baseline ROM, no obvious new focal weakness.  Neurological:       Mental status and motor strength appears baseline for patient and situation.  Skin: No rash noted.  Psychiatric: She has a normal mood and affect.    ED Course  Procedures (including critical care time) The patient will be moved to the CDU to follow up her ED course.  Medical screening examination/treatment/procedure(s) were conducted as a shared visit with non-physician practitioner(s) and myself.  I personally evaluated the patient during the  encounter   Labs Reviewed  DIFFERENTIAL - Abnormal; Notable for the following:    Neutrophils Relative 84 (*)    Neutro Abs 8.0 (*)    Lymphocytes Relative 11 (*)    All other components within normal limits  COMPREHENSIVE METABOLIC PANEL - Abnormal; Notable for the following:    Glucose, Bld 100 (*)    All other components within normal limits  LIPASE, BLOOD - Abnormal; Notable for the following:    Lipase 78 (*)    All other components within normal limits  URINALYSIS, ROUTINE W REFLEX MICROSCOPIC - Abnormal; Notable for the following:    Color, Urine AMBER (*) BIOCHEMICALS MAY BE AFFECTED BY COLOR   Specific Gravity, Urine 1.031 (*)    All other components within normal limits  CBC   US Abdomen Complete  09/18/2011  *RADIOLOGY REPORT*  Clinical Data:  Abdominal pain  ABDOMINAL ULTRASOUND COMPLETE  Comparison:  CT 03/31/2010  Findings:  Gallbladder:  No gallstones, gallbladder wall thickening, or pericholecystic fluid.  Common Bile Duct:  Within normal limits in caliber.  Liver: The liver is increased in echogenicity with poor sound through transmission, suggestive of fatty infiltration.No focal abnormality or ductal dilatation.  IVC:  Appears normal.  Pancreas:  No abnormality identified.  Spleen:  Borderline enlarged measuring 15 cm in length.  Splenic volume 671 ml.  No focal abnormality.  Right kidney:  Normal in size and parenchymal echogenicity.  No evidence of mass or hydronephrosis.  Left kidney:  Normal in size and parenchymal echogenicity.  No evidence of mass or hydronephrosis.  Abdominal Aorta:  No aneurysm identified.  IMPRESSION: Prominent hepatic echogenicity without focal finding.  This may suggest fatty infiltration.  Borderline enlarged spleen.  This is nonspecific.  Given the history of abdominal pain, does the patient have a history of mononucleosis?  Original Report Authenticated By: Harrel Lemon, M.D.     1. Nausea & vomiting   2. Abdominal pain       MDM   I doubt any other EMC precluding discharge at this time including, but not necessarily limited to the following:peritonnitis.        Hurman Horn, MD 09/18/11 1805  Hurman Horn, MD 09/18/11 [phone]

## 2011-09-18 NOTE — ED Notes (Signed)
Pt to cdu from ultrasound. States pain and nausea returning. States pain is generalized abd pain.

## 2011-09-18 NOTE — ED Provider Notes (Signed)
Medical screening examination/treatment/procedure(s) were conducted as a shared visit with non-physician practitioner(s) and myself.  I personally evaluated the patient during the encounter  Hurman Horn, MD 09/18/11 1806

## 2011-09-18 NOTE — ED Provider Notes (Signed)
Discussed results with Dr. Fonnie Jarvis. I have spoken with the patient and explained to avoid contact sports. It is been advised for the patient to followup with her doctor on Monday morning. If she is unable to keep fluids down she can come back to the emergency department tomorrow for 24-hour recheck for her abdominal pain.   1:28 PM Patient has been able to tolerate oral liquids. She is ready to be discharged. She currently has no complaints and is not in pain.  Sebastian, [place] 09/18/11 1610

## 2011-09-18 NOTE — ED Notes (Signed)
Pt tolerating oral fluids. PA aware.

## 2011-10-15 ENCOUNTER — Emergency Department: Payer: Self-pay | Admitting: Emergency Medicine

## 2011-10-19 DIAGNOSIS — B009 Herpesviral infection, unspecified: Secondary | ICD-10-CM | POA: Insufficient documentation

## 2012-02-26 ENCOUNTER — Encounter (HOSPITAL_COMMUNITY): Payer: Self-pay

## 2012-02-26 ENCOUNTER — Emergency Department (HOSPITAL_COMMUNITY)
Admission: EM | Admit: 2012-02-26 | Discharge: 2012-02-26 | Disposition: A | Discharge status: Home / Self Care | Payer: Medicaid Other | Source: Home / Self Care | Attending: Family Medicine | Admitting: Family Medicine

## 2012-02-26 ENCOUNTER — Emergency Department (INDEPENDENT_AMBULATORY_CARE_PROVIDER_SITE_OTHER): Payer: Medicaid Other

## 2012-02-26 DIAGNOSIS — S92353A Displaced fracture of fifth metatarsal bone, unspecified foot, initial encounter for closed fracture: Secondary | ICD-10-CM

## 2012-02-26 DIAGNOSIS — S92309A Fracture of unspecified metatarsal bone(s), unspecified foot, initial encounter for closed fracture: Secondary | ICD-10-CM

## 2012-02-26 NOTE — ED Provider Notes (Signed)
History     CSN: [id number]  Arrival date & time 02/26/12  1901   First MD Initiated Contact with Patient 02/26/12 1902      Chief Complaint  Patient presents with  . Foot Pain    (Consider location/radiation/quality/duration/timing/severity/associated sxs/prior treatment) Patient is a 26 y.o. female presenting with lower extremity pain. The history is provided by the patient.  Foot Pain This is a new problem. The current episode started yesterday (wearing high shoes last eve and rolled left foot over, with continued pain and swelling.). The symptoms are aggravated by walking.    Past Medical History  Diagnosis Date  . GERD (gastroesophageal reflux disease)     Past Surgical History  Procedure Date  . Foot surgery   . Wrist surgery     History reviewed. No pertinent family history.  History  Substance Use Topics  . Smoking status: Not on file  . Smokeless tobacco: Not on file  . Alcohol Use:     OB History    Grav Para Term Preterm Abortions TAB SAB Ect Mult Living                  Review of Systems  Constitutional: Negative.   Musculoskeletal: Positive for gait problem.    Allergies  Morphine and related  Home Medications   Current Outpatient Rx  Name Route Sig Dispense Refill  . NORGESTIMATE-ETH ESTRADIOL 0.25-35 MG-MCG PO TABS Oral Take 1 tablet by mouth daily.    Marland Kitchen OMEPRAZOLE 20 MG PO CPDR Oral Take 20 mg by mouth daily.    Marland Kitchen CETIRIZINE HCL 10 MG PO TABS Oral Take 10 mg by mouth daily.      . DEXLANSOPRAZOLE 60 MG PO CPDR Oral Take 60 mg by mouth daily.      . OXYCODONE-ACETAMINOPHEN 5-325 MG PO TABS Oral Take 1 tablet by mouth every 6 (six) hours as needed. For pain       BP 131/88  Pulse 79  Temp(Src) 98 F (36.7 C) (Oral)  Resp 18  SpO2 100%  LMP 02/07/2012  Physical Exam  Nursing note and vitals reviewed. Constitutional: She is oriented to person, place, and time. She appears well-developed and well-nourished.  Musculoskeletal: She  exhibits tenderness.       Feet:  Neurological: She is alert and oriented to person, place, and time.    ED Course  Procedures (including critical care time)  Labs Reviewed - No data to display Dg Foot Complete Left  02/26/2012  *RADIOLOGY REPORT*  Clinical Data: Twisted.  Lateral pain.  LEFT FOOT - COMPLETE 3+ VIEW  Comparison: 03/30/2009  Findings: There is a nondisplaced oblique fracture of the distal diaphysis of the fifth metatarsal.  No extension to the articular surface.  The patient has had previous surgery in the region of the distal metaphyses of the second, third and fourth metatarsals.  No complications seen in those regions.  There are degenerative changes in the midfoot.  IMPRESSION: Nondisplaced oblique fracture of the distal diaphysis of the fifth metatarsal.  Original Report Authenticated By: Thomasenia Sales, M.D.     1. Fracture of 5th metatarsal       MDM  X-rays reviewed and report per radiologist.         Linna Hoff, MD 02/26/12 2004

## 2012-02-26 NOTE — Discharge Instructions (Signed)
Ice , crutches, wear shoe and call to see Dr Lestine Box for further care as before.

## 2012-02-26 NOTE — ED Notes (Signed)
Pt turned lt foot over last pm and continues to have lt foot pain

## 2012-04-02 ENCOUNTER — Emergency Department (HOSPITAL_COMMUNITY)
Admission: EM | Admit: 2012-04-02 | Discharge: 2012-04-02 | Disposition: A | Discharge status: Home / Self Care | Payer: Medicaid Other | Source: Home / Self Care | Attending: Emergency Medicine | Admitting: Emergency Medicine

## 2012-04-02 ENCOUNTER — Encounter (HOSPITAL_COMMUNITY): Payer: Self-pay | Admitting: *Deleted

## 2012-04-02 DIAGNOSIS — R05 Cough: Secondary | ICD-10-CM

## 2012-04-02 DIAGNOSIS — R059 Cough, unspecified: Secondary | ICD-10-CM

## 2012-04-02 DIAGNOSIS — J029 Acute pharyngitis, unspecified: Secondary | ICD-10-CM

## 2012-04-02 HISTORY — DX: Pain in unspecified foot: M79.673

## 2012-04-02 LAB — POCT RAPID STREP A: Streptococcus, Group A Screen (Direct): NEGATIVE

## 2012-04-02 MED ORDER — CETIRIZINE-PSEUDOEPHEDRINE ER 5-120 MG PO TB12: 1.0000 | ORAL_TABLET | Freq: Every day | ORAL | Status: AC

## 2012-04-02 MED ORDER — BENZONATATE 100 MG PO CAPS: 100.0000 mg | ORAL_CAPSULE | Freq: Three times a day (TID) | ORAL | Status: AC

## 2012-04-02 NOTE — ED Provider Notes (Signed)
History     CSN: [id number]  Arrival date & time 04/02/12  1905   First MD Initiated Contact with Patient 04/02/12 1909      Chief Complaint  Patient presents with  . Sore Throat  . Rash  . Nasal Congestion    (Consider location/radiation/quality/duration/timing/severity/associated sxs/prior treatment) HPI Comments: Patient presents urgent care complaining of a sore throat and congestion with cough for about 2 weeks. Denies any fevers or shortness of breath occasional dry cough sometimes productive she also have been having a rash to her treatment for 3-4 days. Have not used anything over-the-counter so far for her symptoms. They recently returned from the beach they have extensive sunburns  Patient is a 26 y.o. female presenting with pharyngitis and rash. The history is provided by the patient.  Sore Throat This is a new problem. The current episode started more than 1 week ago. The problem occurs constantly. The problem has been gradually worsening. Pertinent negatives include no chest pain, no abdominal pain, no headaches and no shortness of breath. The symptoms are aggravated by nothing. The symptoms are relieved by nothing. She has tried nothing for the symptoms. The treatment provided moderate relief.  Rash     Past Medical History  Diagnosis Date  . GERD (gastroesophageal reflux disease)   . Foot pain     Past Surgical History  Procedure Date  . Foot surgery   . Wrist surgery     History reviewed. No pertinent family history.  History  Substance Use Topics  . Smoking status: Never Smoker   . Smokeless tobacco: Not on file  . Alcohol Use: No    OB History    Grav Para Term Preterm Abortions TAB SAB Ect Mult Living                  Review of Systems  Constitutional: Negative for fever, chills and activity change.  HENT: Positive for congestion and sore throat. Negative for ear pain, facial swelling, rhinorrhea, mouth sores, neck pain, neck stiffness, sinus  pressure and tinnitus.   Respiratory: Positive for cough. Negative for shortness of breath.   Cardiovascular: Negative for chest pain.  Gastrointestinal: Negative for abdominal pain.  Skin: Positive for rash.  Neurological: Negative for headaches.    Allergies  Morphine and related  Home Medications   Current Outpatient Rx  Name Route Sig Dispense Refill  . CETIRIZINE HCL 10 MG PO TABS Oral Take 10 mg by mouth daily.      Marland Kitchen NORGESTIMATE-ETH ESTRADIOL 0.25-35 MG-MCG PO TABS Oral Take 1 tablet by mouth daily.    Marland Kitchen OMEPRAZOLE 20 MG PO CPDR Oral Take 20 mg by mouth daily.    Marland Kitchen OVER THE COUNTER MEDICATION  Throat spray    . OXYCODONE-ACETAMINOPHEN 5-325 MG PO TABS Oral Take 1 tablet by mouth every 6 (six) hours as needed. For pain     . BENZONATATE 100 MG PO CAPS Oral Take 1 capsule (100 mg total) by mouth every 8 (eight) hours. 21 capsule 0  . CETIRIZINE-PSEUDOEPHEDRINE ER 5-120 MG PO TB12 Oral Take 1 tablet by mouth daily. 30 tablet 0  . DEXLANSOPRAZOLE 60 MG PO CPDR Oral Take 60 mg by mouth daily.        BP 139/91  Pulse 102  Temp(Src) 98.8 F (37.1 C) (Oral)  Resp 18  SpO2 98%  LMP 03/29/2012  Physical Exam  Nursing note and vitals reviewed. Constitutional: Vital signs are normal. She appears well-developed and well-nourished.  Non-toxic appearance. She does not have a sickly appearance. She does not appear ill. No distress.  HENT:  Head: Normocephalic.  Mouth/Throat: Uvula is midline and oropharynx is clear and moist. No oropharyngeal exudate, posterior oropharyngeal edema, posterior oropharyngeal erythema or tonsillar abscesses.  Eyes: Conjunctivae are normal. Right eye exhibits no discharge. Left eye exhibits no discharge.  Neck: No JVD present. No tracheal deviation present.  Pulmonary/Chest: Effort normal and breath sounds normal. No respiratory distress. She has no wheezes. She has no rhonchi. She exhibits no tenderness.  Lymphadenopathy:    She has no cervical  adenopathy.    ED Course  Procedures (including critical care time)   Labs Reviewed  POCT RAPID STREP A ([facility] ONLY)   No results found.   1. Cough   2. Pharyngitis       MDM  Patient comfortable in no respiratory distress. With mild pharyngitis and runny nose and moderate sunburn. Encourage her to treat her symptoms for the next 3-5 days with prescribed medicines. Discussed symptoms to warrant further evaluation with her primary care Dr. or [place]. Patient understood discharge instructions and agree with followup care as necessary        Jimmie Molly, MD 04/02/12 2013

## 2012-04-02 NOTE — Discharge Instructions (Signed)
Your exam and symptoms were consistent with either a viral respiratory process versus allergies. Substitute your plain Zyrtec for Zyrtec-D for the next 5-7 days along with Jerilynn Som will help you with your reactive cough.

## 2012-04-02 NOTE — ED Notes (Signed)
Pt with c/o sore throat/congestion x one to two weeks - denies fever - mild cough - pt with rash on fingers x 3 to 4 days

## 2012-06-16 DIAGNOSIS — R0789 Other chest pain: Secondary | ICD-10-CM | POA: Insufficient documentation

## 2012-06-19 DIAGNOSIS — I10 Essential (primary) hypertension: Secondary | ICD-10-CM | POA: Insufficient documentation

## 2012-07-04 ENCOUNTER — Emergency Department (HOSPITAL_COMMUNITY)
Admission: EM | Admit: 2012-07-04 | Discharge: 2012-07-04 | Disposition: A | Discharge status: Home / Self Care | Payer: Medicaid Other | Source: Home / Self Care | Attending: Family Medicine | Admitting: Family Medicine

## 2012-07-04 ENCOUNTER — Encounter (HOSPITAL_COMMUNITY): Payer: Self-pay | Admitting: *Deleted

## 2012-07-04 DIAGNOSIS — R21 Rash and other nonspecific skin eruption: Secondary | ICD-10-CM

## 2012-07-04 DIAGNOSIS — L255 Unspecified contact dermatitis due to plants, except food: Secondary | ICD-10-CM

## 2012-07-04 MED ORDER — TRIAMCINOLONE ACETONIDE 40 MG/ML IJ SUSP
INTRAMUSCULAR | Status: AC
  Filled 2012-07-04: qty 5

## 2012-07-04 MED ORDER — TRIAMCINOLONE ACETONIDE 0.1 % EX CREA: TOPICAL_CREAM | Freq: Two times a day (BID) | CUTANEOUS | Status: AC

## 2012-07-04 MED ORDER — TRIAMCINOLONE ACETONIDE 40 MG/ML IJ SUSP
60.0000 mg | Freq: Once | INTRAMUSCULAR | Status: AC
  Administered 2012-07-04: 60 mg via INTRAMUSCULAR

## 2012-07-04 MED ORDER — DIPHENHYDRAMINE HCL 25 MG PO CAPS: 25.0000 mg | ORAL_CAPSULE | Freq: Four times a day (QID) | ORAL | Status: DC | PRN

## 2012-07-04 NOTE — ED Notes (Signed)
"   i think that I have poison ivy on my arms and neck"

## 2012-07-04 NOTE — ED Provider Notes (Signed)
History     CSN: [id number]  Arrival date & time 07/04/12  1846   First MD Initiated Contact with Patient 07/04/12 2008      Chief Complaint  Patient presents with  . Rash    (Consider location/radiation/quality/duration/timing/severity/associated sxs/prior treatment) Patient is a 26 y.o. female presenting with rash. The history is provided by the patient.  Rash   This patient complains of a pruritic rash.  Location: bilateral forearms, chest and legs  Onset: 3 days ago   Course: unchanged Self-treated with: lotion            Improvement with treatment: no  History Itching: yes  Tenderness: no  New medications/antibiotics: no  Pet exposure: no  Recent travel or tropical exposure: weed eating over the weekend New soaps, shampoos, detergent, clothing: no Tick/insect exposure: no   Red Flags Feeling ill: no Fever: no Facial/tongue swelling/difficulty breathing:  no  Diabetic or immunocompromised: no  Past Medical History  Diagnosis Date  . GERD (gastroesophageal reflux disease)   . Foot pain     Past Surgical History  Procedure Date  . Foot surgery   . Wrist surgery     Family History  Problem Relation Age of Onset  . Family history unknown: Yes    History  Substance Use Topics  . Smoking status: Never Smoker   . Smokeless tobacco: Not on file  . Alcohol Use: No    OB History    Grav Para Term Preterm Abortions TAB SAB Ect Mult Living                  Review of Systems  Constitutional: Negative.   Respiratory: Negative.   Cardiovascular: Negative.   Skin: Positive for rash.    Allergies  Morphine and related  Home Medications   Current Outpatient Rx  Name Route Sig Dispense Refill  . NORGESTIMATE-ETH ESTRADIOL 0.25-35 MG-MCG PO TABS Oral Take 1 tablet by mouth daily.    Marland Kitchen OMEPRAZOLE 20 MG PO CPDR Oral Take 20 mg by mouth daily.    Marland Kitchen CETIRIZINE HCL 10 MG PO TABS Oral Take 10 mg by mouth daily.      Marland Kitchen CETIRIZINE-PSEUDOEPHEDRINE ER  5-120 MG PO TB12 Oral Take 1 tablet by mouth daily. 30 tablet 0  . DEXLANSOPRAZOLE 60 MG PO CPDR Oral Take 60 mg by mouth daily.      Marland Kitchen DIPHENHYDRAMINE HCL 25 MG PO CAPS Oral Take 1 capsule (25 mg total) by mouth every 6 (six) hours as needed for itching. 30 capsule 0  . OVER THE COUNTER MEDICATION  Throat spray    . OXYCODONE-ACETAMINOPHEN 5-325 MG PO TABS Oral Take 1 tablet by mouth every 6 (six) hours as needed. For pain     . TRIAMCINOLONE ACETONIDE 0.1 % EX CREA Topical Apply topically 2 (two) times daily. 30 g 1    BP 124/78  Pulse 100  Temp 98.8 F (37.1 C) (Oral)  Resp 19  SpO2 97%  LMP 06/20/2012  Physical Exam  Nursing note and vitals reviewed. Constitutional: She is oriented to person, place, and time. Vital signs are normal. She appears well-developed and well-nourished. She is active and cooperative.  HENT:  Head: Normocephalic.  Mouth/Throat: Oropharynx is clear and moist. No oropharyngeal exudate.  Eyes: Conjunctivae are normal. Pupils are equal, round, and reactive to light. No scleral icterus.  Neck: Trachea normal. Neck supple.  Cardiovascular: Normal rate and regular rhythm.   Pulmonary/Chest: Effort normal and breath sounds normal.  Neurological: She is alert and oriented to person, place, and time. No cranial nerve deficit or sensory deficit.  Skin: Skin is warm and dry. Rash noted. Rash is papular.     Psychiatric: She has a normal mood and affect. Her speech is normal and behavior is normal. Judgment and thought content normal. Cognition and memory are normal.    ED Course  Procedures (including critical care time)  Labs Reviewed - No data to display No results found.   1. Contact dermatitis due to plant   2. Rash       MDM  Cool showers; avoid heat, sunlight and anything that makes condition worse.  Begin antihistamine for at least seven days.  Begin triamcinolone-follow instructions.  RTC if symptoms do not improve or begin to have problems  swallowing, breathing or significant change in condition.         Johnsie Kindred, NP 07/04/12 2104

## 2012-07-05 NOTE — ED Provider Notes (Signed)
Medical screening examination/treatment/procedure(s) were performed by resident physician or non-physician practitioner and as supervising physician I was immediately available for consultation/collaboration.   Barkley Bruns MD.    Linna Hoff, MD 07/05/12 2128

## 2012-07-07 ENCOUNTER — Other Ambulatory Visit: Payer: Self-pay | Admitting: Registered Nurse

## 2012-07-08 ENCOUNTER — Other Ambulatory Visit: Payer: Self-pay | Admitting: Registered Nurse

## 2012-07-26 DIAGNOSIS — E669 Obesity, unspecified: Secondary | ICD-10-CM | POA: Insufficient documentation

## 2012-09-17 ENCOUNTER — Emergency Department (HOSPITAL_COMMUNITY)
Admission: EM | Admit: 2012-09-17 | Discharge: 2012-09-17 | Disposition: A | Discharge status: Home / Self Care | Payer: Medicaid Other | Source: Home / Self Care

## 2012-10-24 ENCOUNTER — Encounter (HOSPITAL_COMMUNITY): Payer: Self-pay | Admitting: Emergency Medicine

## 2012-10-24 ENCOUNTER — Emergency Department (HOSPITAL_COMMUNITY)
Admission: EM | Admit: 2012-10-24 | Discharge: 2012-10-24 | Disposition: A | Discharge status: Home / Self Care | Payer: Medicaid Other | Source: Home / Self Care | Attending: Family Medicine | Admitting: Family Medicine

## 2012-10-24 DIAGNOSIS — K219 Gastro-esophageal reflux disease without esophagitis: Secondary | ICD-10-CM

## 2012-10-24 DIAGNOSIS — J069 Acute upper respiratory infection, unspecified: Secondary | ICD-10-CM

## 2012-10-24 LAB — POCT URINALYSIS DIP (DEVICE)
Bilirubin Urine: NEGATIVE
Glucose, UA: NEGATIVE mg/dL
Hgb urine dipstick: NEGATIVE
Ketones, ur: NEGATIVE mg/dL
Leukocytes, UA: NEGATIVE
Nitrite: NEGATIVE
Protein, ur: NEGATIVE mg/dL
Specific Gravity, Urine: 1.02 (ref 1.005–1.030)
Urobilinogen, UA: 1 mg/dL (ref 0.0–1.0)
pH: 7.5 (ref 5.0–8.0)

## 2012-10-24 LAB — POCT RAPID STREP A: Streptococcus, Group A Screen (Direct): NEGATIVE

## 2012-10-24 LAB — POCT PREGNANCY, URINE: Preg Test, Ur: NEGATIVE

## 2012-10-24 MED ORDER — IPRATROPIUM BROMIDE 0.06 % NA SOLN: 2.0000 | Freq: Four times a day (QID) | NASAL | Status: DC

## 2012-10-24 NOTE — ED Notes (Signed)
Reports sore throat which started last week.  Patient lost voice on Sunday.  C/o of abd pain which started today around 8am.  Denies vomiting and diarrhea

## 2012-10-24 NOTE — ED Provider Notes (Signed)
History     CSN: [id number]  Arrival date & time 10/24/12  1022   First MD Initiated Contact with Patient 10/24/12 1049      Chief Complaint  Patient presents with  . Sore Throat  . Abdominal Pain    (Consider location/radiation/quality/duration/timing/severity/associated sxs/prior treatment) Patient is a 26 y.o. female presenting with pharyngitis and abdominal pain. The history is provided by the patient.  Sore Throat This is a new problem. The current episode started more than 2 days ago. The problem has been gradually worsening. Associated symptoms include abdominal pain. Associated symptoms comments: Has been off prilosec for 2 weeks, just restarted and feels that will correct problem.. The symptoms are aggravated by swallowing.  Abdominal Pain The primary symptoms of the illness include abdominal pain. The primary symptoms of the illness do not include nausea, vomiting or diarrhea.  Symptoms associated with the illness do not include constipation.    Past Medical History  Diagnosis Date  . GERD (gastroesophageal reflux disease)   . Foot pain     Past Surgical History  Procedure Date  . Foot surgery   . Wrist surgery     History reviewed. No pertinent family history.  History  Substance Use Topics  . Smoking status: Never Smoker   . Smokeless tobacco: Not on file  . Alcohol Use: No    OB History    Grav Para Term Preterm Abortions TAB SAB Ect Mult Living                  Review of Systems  Constitutional: Negative.   HENT: Positive for congestion, sore throat, rhinorrhea and voice change.   Gastrointestinal: Positive for abdominal pain. Negative for nausea, vomiting, diarrhea and constipation.    Allergies  Morphine and related  Home Medications   Current Outpatient Rx  Name  Route  Sig  Dispense  Refill  . CETIRIZINE HCL 10 MG PO TABS   Oral   Take 10 mg by mouth daily.           Marland Kitchen CETIRIZINE-PSEUDOEPHEDRINE ER 5-120 MG PO TB12   Oral  Take 1 tablet by mouth daily.   30 tablet   0   . DEXLANSOPRAZOLE 60 MG PO CPDR   Oral   Take 60 mg by mouth daily.           Marland Kitchen DIPHENHYDRAMINE HCL 25 MG PO CAPS   Oral   Take 1 capsule (25 mg total) by mouth every 6 (six) hours as needed for itching.   30 capsule   0   . IPRATROPIUM BROMIDE 0.06 % NA SOLN   Nasal   Place 2 sprays into the nose 4 (four) times daily.   15 mL   1   . NORGESTIMATE-ETH ESTRADIOL 0.25-35 MG-MCG PO TABS   Oral   Take 1 tablet by mouth daily.         Marland Kitchen OMEPRAZOLE 20 MG PO CPDR   Oral   Take 20 mg by mouth daily.         Marland Kitchen OVER THE COUNTER MEDICATION      Throat spray         . OXYCODONE-ACETAMINOPHEN 5-325 MG PO TABS   Oral   Take 1 tablet by mouth every 6 (six) hours as needed. For pain          . TRIAMCINOLONE ACETONIDE 0.1 % EX CREA   Topical   Apply topically 2 (two) times daily.   30 g  1     BP 120/72  Pulse 80  Temp 98.2 F (36.8 C) (Oral)  Resp 18  SpO2 100%  Physical Exam  Nursing note and vitals reviewed. Constitutional: She is oriented to person, place, and time. She appears well-developed and well-nourished.  HENT:  Head: Normocephalic.  Right Ear: External ear normal.  Left Ear: External ear normal.  Nose: Mucosal edema and rhinorrhea present.  Mouth/Throat: Oropharynx is clear and moist. No oropharyngeal exudate.  Neck: Normal range of motion. Neck supple.  Pulmonary/Chest: Breath sounds normal.  Abdominal: Soft. Bowel sounds are normal. She exhibits no distension. There is no tenderness.  Lymphadenopathy:    She has no cervical adenopathy.  Neurological: She is alert and oriented to person, place, and time.  Skin: Skin is warm and dry.    ED Course  Procedures (including critical care time)   Labs Reviewed  POCT RAPID STREP A ([facility] ONLY)  POCT URINALYSIS DIP (DEVICE)  POCT PREGNANCY, URINE   No results found.   1. URI (upper respiratory infection)   2. GERD (gastroesophageal  reflux disease)       MDM          Linna Hoff, MD 10/24/12 1124

## 2012-12-01 NOTE — ED Notes (Signed)
Returned call from Pharmacy [facility] asking for prior approval for Atrovent nasal spray . Dr Artis Flock denied prior approval, and suggested instead, pt use allegra OTC. Spoke directly w pharmacist

## 2013-03-22 ENCOUNTER — Ambulatory Visit: Payer: Self-pay | Admitting: Internal Medicine

## 2013-04-17 ENCOUNTER — Ambulatory Visit: Payer: Self-pay | Admitting: General Surgery

## 2013-04-20 ENCOUNTER — Encounter: Payer: Self-pay | Admitting: General Surgery

## 2013-05-01 ENCOUNTER — Encounter: Payer: Self-pay | Admitting: General Surgery

## 2013-05-01 ENCOUNTER — Ambulatory Visit (INDEPENDENT_AMBULATORY_CARE_PROVIDER_SITE_OTHER): Payer: Medicaid Other | Admitting: General Surgery

## 2013-05-01 VITALS — BP 120/90 | HR 80 | Resp 16 | Ht 66.0 in | Wt 216.0 lb

## 2013-05-01 DIAGNOSIS — N63 Unspecified lump in unspecified breast: Secondary | ICD-10-CM | POA: Insufficient documentation

## 2013-05-01 NOTE — Patient Instructions (Addendum)
Patient to be scheduled for return appointment for a right breast core biopsy.

## 2013-05-01 NOTE — Progress Notes (Signed)
Patient ID: [name], female   DOB: 07-12-1986, 27 y.o.   MRN: [id number]  Chief Complaint  Patient presents with  . Breast Problem    New Patient breast knot/cyst    HPI [name] is a 27 y.o. female here today for an right breast knots. Patient had an ultrasound performed at [facility] on 03/22/13 with a birad category 4 . She denies any personal or family history of breast cancer. She states she has noticed 2 lumps a few months ago and they seemed to go away but are back now. She denies any breast symptoms at this time.    HPI  Past Medical History  Diagnosis Date  . GERD (gastroesophageal reflux disease)   . Foot pain   . Gastritis [id number]    Past Surgical History  Procedure Laterality Date  . Foot surgery    . Wrist surgery      History reviewed. No pertinent family history.  Social History History  Substance Use Topics  . Smoking status: Never Smoker   . Smokeless tobacco: Never Used  . Alcohol Use: No    Allergies  Allergen Reactions  . Morphine And Related Other (See Comments)    "whole body feels aggravated"    Current Outpatient Prescriptions  Medication Sig Dispense Refill  . cetirizine (ZYRTEC) 10 MG tablet Take 10 mg by mouth daily.        Marland Kitchen dexlansoprazole (DEXILANT) 60 MG capsule Take 60 mg by mouth daily.        Marland Kitchen ipratropium (ATROVENT) 0.06 % nasal spray Place 2 sprays into the nose 4 (four) times daily.  15 mL  1  . norgestimate-ethinyl estradiol (ORTHO-CYCLEN,SPRINTEC,PREVIFEM) 0.25-35 MG-MCG tablet Take 1 tablet by mouth daily.      Marland Kitchen omeprazole (PRILOSEC) 20 MG capsule Take 20 mg by mouth daily.      Marland Kitchen OVER THE COUNTER MEDICATION Throat spray      . oxyCODONE-acetaminophen (PERCOCET) 5-325 MG per tablet Take 1 tablet by mouth every 6 (six) hours as needed. For pain       . triamcinolone cream (KENALOG) 0.1 % Apply topically 2 (two) times daily.  30 g  1  . diphenhydrAMINE (BENADRYL) 25 mg capsule Take 1 capsule (25 mg total) by mouth  every 6 (six) hours as needed for itching.  30 capsule  0   No current facility-administered medications for this visit.    Review of Systems Review of Systems  Constitutional: Negative.   Respiratory: Negative.   Cardiovascular: Negative.     Blood pressure 120/90, pulse 80, resp. rate 16, height 5\' 6"  (1.676 m), weight 216 lb (97.977 kg), last menstrual period 05/01/2013.  Physical Exam Physical Exam  Constitutional: She is oriented to person, place, and time. She appears well-developed and well-nourished.  Neck: Trachea normal. No mass and no thyromegaly present.  Pulmonary/Chest: Right breast exhibits mass. Right breast exhibits no inverted nipple, no nipple discharge, no skin change and no tenderness. Left breast exhibits no inverted nipple, no mass, no nipple discharge, no skin change and no tenderness. Breasts are symmetrical.  Right breast 1-2 o'clock location are 2 ill define palpable masses less than 1 cm size each.  Lymphadenopathy:    She has no cervical adenopathy.    She has no axillary adenopathy.  Neurological: She is alert and oriented to person, place, and time.  Skin: Skin is warm and dry.    Data Reviewed Ultrasound reviewed showing 2 hypoechoic masses corresponding to palpable  findings in the right breast.    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     Plan    Patient to return for Right Breast Core Biopsy. Discussed fully with patient and she is agreeable.        Mindy Gali G 05/03/2013, 6:33 AM

## 2013-05-03 ENCOUNTER — Ambulatory Visit (INDEPENDENT_AMBULATORY_CARE_PROVIDER_SITE_OTHER): Payer: Medicaid Other | Admitting: General Surgery

## 2013-05-03 ENCOUNTER — Encounter: Payer: Self-pay | Admitting: General Surgery

## 2013-05-03 VITALS — BP 134/84 | HR 80 | Resp 16 | Ht 66.0 in | Wt 215.0 lb

## 2013-05-03 DIAGNOSIS — N63 Unspecified lump in unspecified breast: Secondary | ICD-10-CM

## 2013-05-03 HISTORY — PX: BREAST BIOPSY: SHX20

## 2013-05-03 NOTE — Patient Instructions (Addendum)

## 2013-05-03 NOTE — Progress Notes (Signed)
Patient here today for right breast biopsy.  Biopsy completed today. The mass at 2 ocl that was biopsied became markedly smaller after 2 passes. Likely benign. The smaller nodule more peripheral at 2 o'cl was not biopsied

## 2013-05-07 LAB — PATHOLOGY

## 2013-05-08 ENCOUNTER — Telehealth: Payer: Self-pay | Admitting: *Deleted

## 2013-05-08 NOTE — Telephone Encounter (Signed)
Message copied by Currie Paris on Tue May 08, 2013  8:31 AM ------      Message from: Kieth Brightly      Created: Mon May 07, 2013  8:50 PM       Please let pt pt know the pathology was normal. Arrange f/u in 3 mos. ------

## 2013-05-08 NOTE — Telephone Encounter (Signed)
Notified patient as instructed, patient pleased. Discussed follow-up appointments, patient agrees  

## 2013-05-25 ENCOUNTER — Encounter: Payer: Self-pay | Admitting: General Surgery

## 2013-08-09 ENCOUNTER — Encounter: Payer: Self-pay | Admitting: General Surgery

## 2013-08-09 ENCOUNTER — Ambulatory Visit (INDEPENDENT_AMBULATORY_CARE_PROVIDER_SITE_OTHER): Payer: Medicaid Other | Admitting: General Surgery

## 2013-08-09 ENCOUNTER — Other Ambulatory Visit: Payer: Medicaid Other

## 2013-08-09 VITALS — BP 130/78 | HR 74 | Resp 12 | Ht 66.0 in | Wt 211.0 lb

## 2013-08-09 DIAGNOSIS — N63 Unspecified lump in unspecified breast: Secondary | ICD-10-CM

## 2013-08-09 NOTE — Patient Instructions (Signed)
Patient to return for excision of right breast masses.

## 2013-08-09 NOTE — Progress Notes (Signed)
Patient ID: [name], female   DOB: 1986/05/01, 27 y.o.   MRN: [id number]  Chief Complaint  Patient presents with  . Follow-up    3 month follow up right breast mass no mammogram    HPI [name] is a 27 y.o. female who presents for a 3 month follow up of a right breast mass. The patient states that since her breast biopsy in July the lump has gotten larger. She states she has some tenderness in this area with touch. She has another area in the same breast that is also sore to the touch. She states she noticed this area approximately 1 month ago.  Patient now has a 1.5cm firm mass at 2 o'clock right breast.   HPI  Past Medical History  Diagnosis Date  . GERD (gastroesophageal reflux disease)   . Foot pain   . Gastritis [id number]    Past Surgical History  Procedure Laterality Date  . Foot surgery    . Wrist surgery    . Breast biopsy Right 05/03/13    History reviewed. No pertinent family history.  Social History History  Substance Use Topics  . Smoking status: Never Smoker   . Smokeless tobacco: Never Used  . Alcohol Use: No    Allergies  Allergen Reactions  . Morphine And Related Other (See Comments)    "whole body feels aggravated"    Current Outpatient Prescriptions  Medication Sig Dispense Refill  . cetirizine (ZYRTEC) 10 MG tablet Take 10 mg by mouth daily.        Marland Kitchen dexlansoprazole (DEXILANT) 60 MG capsule Take 60 mg by mouth daily.        Marland Kitchen ipratropium (ATROVENT) 0.06 % nasal spray Place 2 sprays into the nose 4 (four) times daily.  15 mL  1  . norgestimate-ethinyl estradiol (ORTHO-CYCLEN,SPRINTEC,PREVIFEM) 0.25-35 MG-MCG tablet Take 1 tablet by mouth daily.      Marland Kitchen omeprazole (PRILOSEC) 20 MG capsule Take 20 mg by mouth daily.      Marland Kitchen OVER THE COUNTER MEDICATION Throat spray      . oxyCODONE-acetaminophen (PERCOCET) 5-325 MG per tablet Take 1 tablet by mouth every 6 (six) hours as needed. For pain       . diphenhydrAMINE (BENADRYL) 25 mg capsule  Take 1 capsule (25 mg total) by mouth every 6 (six) hours as needed for itching.  30 capsule  0   No current facility-administered medications for this visit.    Review of Systems Review of Systems  Constitutional: Negative.   Respiratory: Negative.   Cardiovascular: Negative.     Blood pressure 130/78, pulse 74, resp. rate 12, height 5\' 6"  (1.676 m), weight 211 lb (95.709 kg), last menstrual period 08/09/2013.  Physical Exam Physical Exam  Constitutional: She is oriented to person, place, and time. She appears well-developed and well-nourished.  Pulmonary/Chest: Right breast exhibits mass. Right breast exhibits no inverted nipple, no nipple discharge, no skin change and no tenderness. Left breast exhibits no inverted nipple, no mass, no nipple discharge, no skin change and no tenderness.    Lymphadenopathy:    She has no cervical adenopathy.    She has no axillary adenopathy.  Neurological: She is alert and oriented to person, place, and time.  Skin: Skin is warm and dry.    Data Reviewed  Ultrasound shows 2 hypoechoic masses close by at 2 o'clock in right breast.   Assessment    2 breast masses in the right 1 which has gotten larger.  Recommend excision because of the change in size. Patient is agreeable.     Plan    Patient to return for excision of right breast masses x 2.        Keyden Pavlov G 08/09/2013, 1:08 PM

## 2013-09-04 ENCOUNTER — Ambulatory Visit: Payer: Medicaid Other | Admitting: General Surgery

## 2013-09-18 ENCOUNTER — Other Ambulatory Visit: Payer: Self-pay | Admitting: *Deleted

## 2013-09-18 DIAGNOSIS — N63 Unspecified lump in unspecified breast: Secondary | ICD-10-CM

## 2013-09-25 ENCOUNTER — Encounter: Payer: Self-pay | Admitting: *Deleted

## 2013-10-05 ENCOUNTER — Inpatient Hospital Stay: Admission: RE | Admit: 2013-10-05 | Payer: Medicaid Other | Source: Ambulatory Visit

## 2013-10-23 ENCOUNTER — Ambulatory Visit
Admission: RE | Admit: 2013-10-23 | Discharge: 2013-10-23 | Disposition: A | Discharge status: Home / Self Care | Payer: Medicaid Other | Source: Ambulatory Visit | Attending: *Deleted | Admitting: *Deleted

## 2013-10-23 ENCOUNTER — Ambulatory Visit: Admission: RE | Admit: 2013-10-23 | Payer: Medicaid Other | Source: Ambulatory Visit

## 2013-10-23 ENCOUNTER — Other Ambulatory Visit: Payer: Self-pay | Admitting: *Deleted

## 2013-10-23 DIAGNOSIS — N63 Unspecified lump in unspecified breast: Secondary | ICD-10-CM

## 2013-10-28 ENCOUNTER — Emergency Department (HOSPITAL_COMMUNITY)
Admission: EM | Admit: 2013-10-28 | Discharge: 2013-10-28 | Disposition: A | Discharge status: Home / Self Care | Payer: Medicaid Other | Source: Home / Self Care | Attending: Emergency Medicine | Admitting: Emergency Medicine

## 2013-10-28 ENCOUNTER — Encounter (HOSPITAL_COMMUNITY): Payer: Self-pay | Admitting: Emergency Medicine

## 2013-10-28 DIAGNOSIS — J069 Acute upper respiratory infection, unspecified: Secondary | ICD-10-CM

## 2013-10-28 DIAGNOSIS — J019 Acute sinusitis, unspecified: Secondary | ICD-10-CM

## 2013-10-28 MED ORDER — AMOXICILLIN 400 MG/5ML PO SUSR: ORAL | Status: DC

## 2013-10-28 NOTE — ED Provider Notes (Signed)
  Chief Complaint   Chief Complaint  Patient presents with  . Sore Throat  . Cough    History of Present Illness   [name] is a 27 year old female who has had a 5 to six-day history of headache, cough productive of brown to green sputum, sore throat, nasal congestion with brown green drainage, and right ear pain. She denies any fever, chills, wheezing, chest tightness, chest pain, or GI symptoms. She has been exposed to her son who has identical symptoms.  Review of Systems   Other than noted above, the patient denies any of the following symptoms: Systemic:  No fevers, chills, sweats, weight loss or gain, fatigue, or tiredness. Eye:  No redness or discharge. ENT:  No ear pain, drainage, headache, nasal congestion, drainage, sinus pressure, difficulty swallowing, or sore throat. Neck:  No neck pain or swollen glands. Lungs:  No cough, sputum production, hemoptysis, wheezing, chest tightness, shortness of breath or chest pain. GI:  No abdominal pain, nausea, vomiting or diarrhea.  PMFSH   Past medical history, family history, social history, meds, and allergies were reviewed. She has gastroesophageal reflux and allergies. She also has chronic left foot pain status post a fracture she takes phentermine, Zyrtec, omeprazole, and oxycodone.  Physical exam   Vital signs:  BP 139/97  Pulse 82  Temp(Src) 98.3 F (36.8 C) (Oral)  Resp 18  SpO2 99%  LMP 10/28/2013 General:  Alert and oriented.  In no distress.  Skin warm and dry. Eye:  No conjunctival injection or drainage. Lids were normal. ENT:  TMs and canals were normal, without erythema or inflammation.  Nasal mucosa was clear and uncongested, without drainage.  Mucous membranes were moist.  Pharynx was erythematous with copious, thick, yellow-green posterior pharyngeal drainage.  There were no oral ulcerations or lesions. Neck:  Supple, no adenopathy, tenderness or mass. Lungs:  No respiratory distress.  Lungs were clear  to auscultation, without wheezes, rales or rhonchi.  Breath sounds were clear and equal bilaterally.  Heart:  Regular rhythm, without gallops, murmers or rubs. Skin:  Clear, warm, and dry, without rash or lesions.  Assessment     The primary encounter diagnosis was Viral upper respiratory infection. A diagnosis of Acute sinusitis was also pertinent to this visit.   Plan    1.  Meds:  The following meds were prescribed:   Discharge Medication List as of 10/28/2013 12:53 PM    START taking these medications   Details  amoxicillin (AMOXIL) 400 MG/5ML suspension 12.5 mL TID, Normal        2.  Patient Education/Counseling:  The patient was given appropriate handouts, self care instructions, and instructed in symptomatic relief.  Instructed to get extra fluids, rest, and use a cool mist vaporizer.   3.  Follow up:  The patient was told to follow up here if no better in 3 to 4 days, or sooner if becoming worse in any way, and given some red flag symptoms such as increasing fever, difficulty breathing, chest pain, or persistent vomiting which would prompt immediate return.  Follow up here as needed.     Reuben Likes, MD 10/28/13 1314

## 2013-10-28 NOTE — ED Notes (Signed)
Sore throat, cough, headache, congestion, onset 12/24.

## 2013-10-28 NOTE — ED Notes (Signed)
Being treated with son as patient in the same room

## 2013-11-15 ENCOUNTER — Emergency Department: Payer: Self-pay | Admitting: Emergency Medicine

## 2013-11-16 ENCOUNTER — Encounter (HOSPITAL_COMMUNITY): Payer: Self-pay | Admitting: Emergency Medicine

## 2013-11-16 ENCOUNTER — Emergency Department (HOSPITAL_COMMUNITY): Payer: Medicaid Other

## 2013-11-16 ENCOUNTER — Emergency Department (HOSPITAL_COMMUNITY)
Admission: EM | Admit: 2013-11-16 | Discharge: 2013-11-16 | Disposition: A | Discharge status: Home / Self Care | Payer: Medicaid Other | Attending: Emergency Medicine | Admitting: Emergency Medicine

## 2013-11-16 DIAGNOSIS — R110 Nausea: Secondary | ICD-10-CM | POA: Insufficient documentation

## 2013-11-16 DIAGNOSIS — R109 Unspecified abdominal pain: Secondary | ICD-10-CM

## 2013-11-16 DIAGNOSIS — R6883 Chills (without fever): Secondary | ICD-10-CM | POA: Insufficient documentation

## 2013-11-16 DIAGNOSIS — R161 Splenomegaly, not elsewhere classified: Secondary | ICD-10-CM

## 2013-11-16 DIAGNOSIS — K219 Gastro-esophageal reflux disease without esophagitis: Secondary | ICD-10-CM | POA: Insufficient documentation

## 2013-11-16 DIAGNOSIS — Z79899 Other long term (current) drug therapy: Secondary | ICD-10-CM | POA: Insufficient documentation

## 2013-11-16 DIAGNOSIS — Z3202 Encounter for pregnancy test, result negative: Secondary | ICD-10-CM | POA: Insufficient documentation

## 2013-11-16 DIAGNOSIS — R21 Rash and other nonspecific skin eruption: Secondary | ICD-10-CM | POA: Insufficient documentation

## 2013-11-16 LAB — COMPREHENSIVE METABOLIC PANEL
ALT: 25 U/L (ref 0–35)
AST: 23 U/L (ref 0–37)
Albumin: 4.1 g/dL (ref 3.5–5.2)
Alkaline Phosphatase: 76 U/L (ref 39–117)
BUN: 9 mg/dL (ref 6–23)
CO2: 24 mEq/L (ref 19–32)
Calcium: 9 mg/dL (ref 8.4–10.5)
Chloride: 101 mEq/L (ref 96–112)
Creatinine, Ser: 0.87 mg/dL (ref 0.50–1.10)
GFR calc Af Amer: >90 mL/min (ref >90)
GFR calc non Af Amer: >90 mL/min (ref >90)
Glucose, Bld: 81 mg/dL (ref 70–99)
Potassium: 4 mEq/L (ref 3.7–5.3)
Sodium: 138 mEq/L (ref 137–147)
Total Bilirubin: 0.6 mg/dL (ref 0.3–1.2)
Total Protein: 7.5 g/dL (ref 6.0–8.3)

## 2013-11-16 LAB — CBC WITH DIFFERENTIAL/PLATELET
Basophils Absolute: 0 10*3/uL (ref 0.0–0.1)
Basophils Relative: 0 % (ref 0–1)
Eosinophils Absolute: 0.1 10*3/uL (ref 0.0–0.7)
Eosinophils Relative: 1 % (ref 0–5)
HCT: 41.1 % (ref 36.0–46.0)
Hemoglobin: 14.2 g/dL (ref 12.0–15.0)
Lymphocytes Relative: 17 % (ref 12–46)
Lymphs Abs: 1.2 10*3/uL (ref 0.7–4.0)
MCH: 28.9 pg (ref 26.0–34.0)
MCHC: 34.5 g/dL (ref 30.0–36.0)
MCV: 83.7 fL (ref 78.0–100.0)
Monocytes Absolute: 0.5 10*3/uL (ref 0.1–1.0)
Monocytes Relative: 6 % (ref 3–12)
Neutro Abs: 5.5 10*3/uL (ref 1.7–7.7)
Neutrophils Relative %: 76 % (ref 43–77)
Platelets: 199 10*3/uL (ref 150–400)
RBC: 4.91 MIL/uL (ref 3.87–5.11)
RDW: 12.9 % (ref 11.5–15.5)
WBC: 7.2 10*3/uL (ref 4.0–10.5)

## 2013-11-16 LAB — URINALYSIS, ROUTINE W REFLEX MICROSCOPIC
Bilirubin Urine: NEGATIVE
Glucose, UA: NEGATIVE mg/dL
Hgb urine dipstick: NEGATIVE
Ketones, ur: NEGATIVE mg/dL
Nitrite: NEGATIVE
Protein, ur: NEGATIVE mg/dL
Specific Gravity, Urine: 1.013 (ref 1.005–1.030)
Urobilinogen, UA: 0.2 mg/dL (ref 0.0–1.0)
pH: 7.5 (ref 5.0–8.0)

## 2013-11-16 LAB — URINE MICROSCOPIC-ADD ON

## 2013-11-16 LAB — LIPASE, BLOOD: Lipase: 35 U/L (ref 11–59)

## 2013-11-16 LAB — POCT PREGNANCY, URINE: Preg Test, Ur: NEGATIVE

## 2013-11-16 MED ORDER — CEPHALEXIN 500 MG PO CAPS: ORAL_CAPSULE | ORAL | Status: DC

## 2013-11-16 MED ORDER — FENTANYL CITRATE 0.05 MG/ML IJ SOLN
100.0000 ug | Freq: Once | INTRAMUSCULAR | Status: AC
  Administered 2013-11-16: 100 ug via INTRAVENOUS
  Filled 2013-11-16: qty 2

## 2013-11-16 MED ORDER — ONDANSETRON HCL 4 MG/2ML IJ SOLN
4.0000 mg | Freq: Once | INTRAMUSCULAR | Status: AC
  Administered 2013-11-16: 4 mg via INTRAVENOUS
  Filled 2013-11-16: qty 2

## 2013-11-16 MED ORDER — PROMETHAZINE HCL 25 MG PO TABS: 25.0000 mg | ORAL_TABLET | Freq: Four times a day (QID) | ORAL | Status: DC | PRN

## 2013-11-16 MED ORDER — DIPHENHYDRAMINE HCL 50 MG/ML IJ SOLN
25.0000 mg | Freq: Once | INTRAMUSCULAR | Status: AC
  Administered 2013-11-16: 25 mg via INTRAVENOUS
  Filled 2013-11-16: qty 1

## 2013-11-16 MED ORDER — FENTANYL CITRATE 0.05 MG/ML IJ SOLN
100.0000 ug | Freq: Once | INTRAMUSCULAR | Status: AC
Start: 2013-11-16 — End: 2013-11-16
  Administered 2013-11-16: 100 ug via INTRAVENOUS
  Filled 2013-11-16: qty 2

## 2013-11-16 NOTE — ED Provider Notes (Signed)
Medical screening examination/treatment/procedure(s) were performed by non-physician practitioner and as supervising physician I was immediately available for consultation/collaboration.  EKG Interpretation   None         Audree CamelScott T Makaylyn Sinyard, MD 11/16/13 2241

## 2013-11-16 NOTE — Discharge Instructions (Signed)
Abdominal Pain, Women °Abdominal (stomach, pelvic, or belly) pain can be caused by many things. It is important to tell your doctor: °· The location of the pain. °· Does it come and go or is it present all the time? °· Are there things that start the pain (eating certain foods, exercise)? °· Are there other symptoms associated with the pain (fever, nausea, vomiting, diarrhea)? °All of this is helpful to know when trying to find the cause of the pain. °CAUSES  °· Stomach: virus or bacteria infection, or ulcer. °· Intestine: appendicitis (inflamed appendix), regional ileitis (Crohn's disease), ulcerative colitis (inflamed colon), irritable bowel syndrome, diverticulitis (inflamed diverticulum of the colon), or cancer of the stomach or intestine. °· Gallbladder disease or stones in the gallbladder. °· Kidney disease, kidney stones, or infection. °· Pancreas infection or cancer. °· Fibromyalgia (pain disorder). °· Diseases of the female organs: °· Uterus: fibroid (non-cancerous) tumors or infection. °· Fallopian tubes: infection or tubal pregnancy. °· Ovary: cysts or tumors. °· Pelvic adhesions (scar tissue). °· Endometriosis (uterus lining tissue growing in the pelvis and on the pelvic organs). °· Pelvic congestion syndrome (female organs filling up with blood just before the menstrual period). °· Pain with the menstrual period. °· Pain with ovulation (producing an egg). °· Pain with an IUD (intrauterine device, birth control) in the uterus. °· Cancer of the female organs. °· Functional pain (pain not caused by a disease, may improve without treatment). °· Psychological pain. °· Depression. °DIAGNOSIS  °Your doctor will decide the seriousness of your pain by doing an examination. °· Blood tests. °· X-rays. °· Ultrasound. °· CT scan (computed tomography, special type of X-ray). °· MRI (magnetic resonance imaging). °· Cultures, for infection. °· Barium enema (dye inserted in the large intestine, to better view it with  X-rays). °· Colonoscopy (looking in intestine with a lighted tube). °· Laparoscopy (minor surgery, looking in abdomen with a lighted tube). °· Major abdominal exploratory surgery (looking in abdomen with a large incision). °TREATMENT  °The treatment will depend on the cause of the pain.  °· Many cases can be observed and treated at home. °· Over-the-counter medicines recommended by your caregiver. °· Prescription medicine. °· Antibiotics, for infection. °· Birth control pills, for painful periods or for ovulation pain. °· Hormone treatment, for endometriosis. °· Nerve blocking injections. °· Physical therapy. °· Antidepressants. °· Counseling with a psychologist or psychiatrist. °· Minor or major surgery. °HOME CARE INSTRUCTIONS  °· Do not take laxatives, unless directed by your caregiver. °· Take over-the-counter pain medicine only if ordered by your caregiver. Do not take aspirin because it can cause an upset stomach or bleeding. °· Try a clear liquid diet (broth or water) as ordered by your caregiver. Slowly move to a bland diet, as tolerated, if the pain is related to the stomach or intestine. °· Have a thermometer and take your temperature several times a day, and record it. °· Bed rest and sleep, if it helps the pain. °· Avoid sexual intercourse, if it causes pain. °· Avoid stressful situations. °· Keep your follow-up appointments and tests, as your caregiver orders. °· If the pain does not go away with medicine or surgery, you may try: °· Acupuncture. °· Relaxation exercises (yoga, meditation). °· Group therapy. °· Counseling. °SEEK MEDICAL CARE IF:  °· You notice certain foods cause stomach pain. °· Your home care treatment is not helping your pain. °· You need stronger pain medicine. °· You want your IUD removed. °· You feel faint or   lightheaded. °· You develop nausea and vomiting. °· You develop a rash. °· You are having side effects or an allergy to your medicine. °SEEK IMMEDIATE MEDICAL CARE IF:  °· Your  pain does not go away or gets worse. °· You have a fever. °· Your pain is felt only in portions of the abdomen. The right side could possibly be appendicitis. The left lower portion of the abdomen could be colitis or diverticulitis. °· You are passing blood in your stools (bright red or black tarry stools, with or without vomiting). °· You have blood in your urine. °· You develop chills, with or without a fever. °· You pass out. °MAKE SURE YOU:  °· Understand these instructions. °· Will watch your condition. °· Will get help right away if you are not doing well or get worse. °Document Released: 08/15/2007 Document Revised: 01/10/2012 Document Reviewed: 09/04/2009 °[employer]® Patient Information ©2014 [employer]. ° °

## 2013-11-16 NOTE — ED Provider Notes (Signed)
CSN: [id number]     Arrival date & time 11/16/13  1017 History   First MD Initiated Contact with Patient 11/16/13 1048     Chief Complaint  Patient presents with  . Abdominal Pain   (Consider location/radiation/quality/duration/timing/severity/associated sxs/prior Treatment) HPI Comments: Patient is a 28 year old female with history of GERD and gastritis who presents today for sudden onset abdominal pain. She reports the pain began while she was working out this morning. She had just started during the elliptical machine. She reports the pain is a sharp pain in her right upper quadrant. The pain radiates into her epigastric area. She denies any associated nausea, vomiting, diarrhea. She does note that she has had cold sweats, but is unsure if she had a fever. She has not had any abdominal surgeries. She still has her gallbladder. Her last BM was yesterday. Additionally she was seen for a spider bite in her right groin yesterday at [facility]. She was started on Bactrim. She feels as though the red area around the bite has worsened since yesterday.  The history is provided by the patient. No language interpreter was used.    Past Medical History  Diagnosis Date  . GERD (gastroesophageal reflux disease)   . Foot pain   . Gastritis [id number]   Past Surgical History  Procedure Laterality Date  . Foot surgery    . Wrist surgery    . Breast biopsy Right 05/03/13   History reviewed. No pertinent family history. History  Substance Use Topics  . Smoking status: Never Smoker   . Smokeless tobacco: Never Used  . Alcohol Use: Yes   OB History   Grav Para Term Preterm Abortions TAB SAB Ect Mult Living   2 1        1      Obstetric Comments   1st Menstrual Cycle:  12 1st Pregnancy: 20     Review of Systems  Constitutional: Positive for chills.  Respiratory: Negative for shortness of breath.   Cardiovascular: Negative for chest pain.  Gastrointestinal: Positive for  nausea and abdominal pain. Negative for vomiting, diarrhea and constipation.  Skin: Positive for rash.  All other systems reviewed and are negative.    Allergies  Morphine and related  Home Medications   Current Outpatient Rx  Name  Route  Sig  Dispense  Refill  . amoxicillin (AMOXIL) 400 MG/5ML suspension      12.5 mL TID   375 mL   0   . cetirizine (ZYRTEC) 10 MG tablet   Oral   Take 10 mg by mouth daily.           Marland Kitchen dexlansoprazole (DEXILANT) 60 MG capsule   Oral   Take 60 mg by mouth daily.           Marland Kitchen EXPIRED: diphenhydrAMINE (BENADRYL) 25 mg capsule   Oral   Take 1 capsule (25 mg total) by mouth every 6 (six) hours as needed for itching.   30 capsule   0   . guaiFENesin (MUCINEX) 600 MG 12 hr tablet   Oral   Take by mouth 2 (two) times daily.         Marland Kitchen ipratropium (ATROVENT) 0.06 % nasal spray   Nasal   Place 2 sprays into the nose 4 (four) times daily.   15 mL   1   . norgestimate-ethinyl estradiol (ORTHO-CYCLEN,SPRINTEC,PREVIFEM) 0.25-35 MG-MCG tablet   Oral   Take 1 tablet by mouth daily.         Marland Kitchen  omeprazole (PRILOSEC) 20 MG capsule   Oral   Take 20 mg by mouth daily.         Marland Kitchen OVER THE COUNTER MEDICATION      Throat spray         . oxyCODONE-acetaminophen (PERCOCET) 5-325 MG per tablet   Oral   Take 1 tablet by mouth every 6 (six) hours as needed. For pain          . phentermine 37.5 MG capsule   Oral   Take 37.5 mg by mouth every morning.          BP 138/97  Pulse 106  Temp(Src) 98.6 F (37 C) (Oral)  Resp 16  Ht 5\' 6"  (1.676 m)  Wt 216 lb 9.6 oz (98.249 kg)  BMI 34.98 kg/m2  SpO2 97%  LMP 10/28/2013 Physical Exam  Nursing note and vitals reviewed. Constitutional: She is oriented to person, place, and time. She appears well-developed and well-nourished. No distress.  HENT:  Head: Normocephalic and atraumatic.  Right Ear: External ear normal.  Left Ear: External ear normal.  Nose: Nose normal.  Mouth/Throat:  Oropharynx is clear and moist.  Eyes: Conjunctivae are normal.  Neck: Normal range of motion.  Cardiovascular: Normal rate, regular rhythm and normal heart sounds.   Pulmonary/Chest: Effort normal and breath sounds normal. No stridor. No respiratory distress. She has no wheezes. She has no rales.  Abdominal: Soft. Bowel sounds are normal. She exhibits no distension. There is tenderness in the right upper quadrant. There is no rigidity, no rebound and no guarding.  Musculoskeletal: Normal range of motion.  Neurological: She is alert and oriented to person, place, and time. She has normal strength.  Skin: Skin is warm and dry. She is not diaphoretic. There is erythema.  4 cm area of erythema without induration or fluctuance on right groin  Psychiatric: She has a normal mood and affect. Her behavior is normal.    ED Course  Procedures (including critical care time) Labs Review Labs Reviewed  URINALYSIS, ROUTINE W REFLEX MICROSCOPIC - Abnormal; Notable for the following:    APPearance HAZY (*)    Leukocytes, UA TRACE (*)    All other components within normal limits  URINE MICROSCOPIC-ADD ON - Abnormal; Notable for the following:    Squamous Epithelial / LPF FEW (*)    Bacteria, UA FEW (*)    Casts HYALINE CASTS (*)    All other components within normal limits  URINE CULTURE  LIPASE, BLOOD  COMPREHENSIVE METABOLIC PANEL  CBC WITH DIFFERENTIAL  POCT PREGNANCY, URINE   Imaging Review US Abdomen Complete  11/16/2013   CLINICAL DATA:  Right upper quadrant pain  EXAM: ULTRASOUND ABDOMEN COMPLETE  COMPARISON:  Ultrasound 09/18/2011  FINDINGS: Gallbladder:  No gallstones or wall thickening visualized. No sonographic Murphy sign noted.  Common bile duct:  Diameter: 2.8 mm  Liver:  No focal lesion identified. Within normal limits in parenchymal echogenicity.  IVC:  No abnormality visualized.  Pancreas:  Visualized portion unremarkable.  Spleen:  Prominent spleen. Splenic volume 454 mL. This was  present previously as well.  Right Kidney:  Length: 10.4 cm. Echogenicity within normal limits. No mass or hydronephrosis visualized.  Left Kidney:  Length: 12.0 cm. Echogenicity within normal limits. No mass or hydronephrosis visualized.  Abdominal aorta:  No aneurysm visualized.  Other findings:  None.  IMPRESSION: Negative for gallstones.  Splenomegaly.   Electronically Signed   By: Marlan Palau M.D.   On: 11/16/2013 13:23  EKG Interpretation   None       MDM   1. Abdominal pain   2. Splenomegaly    Patient is nontoxic, nonseptic appearing, in no apparent distress.  Patient's pain and other symptoms adequately managed in emergency department.  Fluid bolus given.  Labs, imaging and vitals reviewed.  Patient does not meet the SIRS or Sepsis criteria.  On repeat exam patient does not have a surgical abdomen and there are no peritoneal signs.  No indication of appendicitis, bowel obstruction, bowel perforation, cholecystitis, diverticulitis, PID or ectopic pregnancy.  Patient discharged home with symptomatic treatment and given strict instructions for follow-up with their primary care physician.  I have also discussed reasons to return immediately to the ER. Patient has known hx of splenomegaly and has been worked up for this in the past. She asks for another GI referral for a second opinion.  Patient expresses understanding and agrees with plan.   Additionally patient [facility]. She does not feel as though this is improving. She was given Bactrim. Wrote additional rx for keflex. No abscess to drain at this time.      Mora BellmanHannah S Bisma Klett, PA-C 11/16/13 [id number]

## 2013-11-16 NOTE — ED Notes (Signed)
Pt reports noting bump to right upper leg near groin last night; went to [place] where she was told it was an insect bite. Pt states this AM that she started having right upper quadrant pain that radiated to center of abdomen, mildly tender on palpation. Denies N/V/D.

## 2013-11-16 NOTE — ED Notes (Signed)
Pt c/o generalized abd pain and chills onset 1 hour ago. [facility] and started on bactrim for "insect bite to my groin."

## 2013-11-17 LAB — URINE CULTURE
Colony Count: NO GROWTH
Culture: NO GROWTH

## 2013-11-26 ENCOUNTER — Emergency Department (HOSPITAL_COMMUNITY)
Admission: EM | Admit: 2013-11-26 | Discharge: 2013-11-26 | Disposition: A | Discharge status: Home / Self Care | Payer: Medicaid Other | Attending: Emergency Medicine | Admitting: Emergency Medicine

## 2013-11-26 ENCOUNTER — Encounter (HOSPITAL_COMMUNITY): Payer: Self-pay | Admitting: Emergency Medicine

## 2013-11-26 DIAGNOSIS — K219 Gastro-esophageal reflux disease without esophagitis: Secondary | ICD-10-CM | POA: Insufficient documentation

## 2013-11-26 DIAGNOSIS — R112 Nausea with vomiting, unspecified: Secondary | ICD-10-CM | POA: Insufficient documentation

## 2013-11-26 DIAGNOSIS — Z3202 Encounter for pregnancy test, result negative: Secondary | ICD-10-CM | POA: Insufficient documentation

## 2013-11-26 DIAGNOSIS — R197 Diarrhea, unspecified: Secondary | ICD-10-CM | POA: Insufficient documentation

## 2013-11-26 DIAGNOSIS — Z79899 Other long term (current) drug therapy: Secondary | ICD-10-CM | POA: Insufficient documentation

## 2013-11-26 LAB — COMPREHENSIVE METABOLIC PANEL
ALT: 30 U/L (ref 0–35)
AST: 26 U/L (ref 0–37)
Albumin: 3.8 g/dL (ref 3.5–5.2)
Alkaline Phosphatase: 72 U/L (ref 39–117)
BUN: 11 mg/dL (ref 6–23)
CO2: 27 mEq/L (ref 19–32)
Calcium: 9.1 mg/dL (ref 8.4–10.5)
Chloride: 101 mEq/L (ref 96–112)
Creatinine, Ser: 0.7 mg/dL (ref 0.50–1.10)
GFR calc Af Amer: >90 mL/min (ref >90)
GFR calc non Af Amer: >90 mL/min (ref >90)
Glucose, Bld: 81 mg/dL (ref 70–99)
Potassium: 3.9 mEq/L (ref 3.7–5.3)
Sodium: 140 mEq/L (ref 137–147)
Total Bilirubin: 0.4 mg/dL (ref 0.3–1.2)
Total Protein: 7.3 g/dL (ref 6.0–8.3)

## 2013-11-26 LAB — URINE MICROSCOPIC-ADD ON

## 2013-11-26 LAB — CBC WITH DIFFERENTIAL/PLATELET
Basophils Absolute: 0 10*3/uL (ref 0.0–0.1)
Basophils Relative: 0 % (ref 0–1)
Eosinophils Absolute: 0.1 K/uL (ref 0.0–0.7)
Eosinophils Relative: 1 % (ref 0–5)
HCT: 43.5 % (ref 36.0–46.0)
Hemoglobin: 14.5 g/dL (ref 12.0–15.0)
Lymphocytes Relative: 12 % (ref 12–46)
Lymphs Abs: 1.3 10*3/uL (ref 0.7–4.0)
MCH: 28.8 pg (ref 26.0–34.0)
MCHC: 33.3 g/dL (ref 30.0–36.0)
MCV: 86.5 fL (ref 78.0–100.0)
Monocytes Absolute: 0.4 10*3/uL (ref 0.1–1.0)
Monocytes Relative: 3 % (ref 3–12)
Neutro Abs: 9.5 10*3/uL — ABNORMAL HIGH (ref 1.7–7.7)
Neutrophils Relative %: 84 % — ABNORMAL HIGH (ref 43–77)
Platelets: 201 10*3/uL (ref 150–400)
RBC: 5.03 MIL/uL (ref 3.87–5.11)
RDW: 13.1 % (ref 11.5–15.5)
WBC: 11.3 10*3/uL — ABNORMAL HIGH (ref 4.0–10.5)

## 2013-11-26 LAB — URINALYSIS, ROUTINE W REFLEX MICROSCOPIC
Bilirubin Urine: NEGATIVE
Glucose, UA: NEGATIVE mg/dL
Hgb urine dipstick: NEGATIVE
Ketones, ur: NEGATIVE mg/dL
Leukocytes, UA: NEGATIVE
Nitrite: NEGATIVE
Protein, ur: 30 mg/dL — AB
Specific Gravity, Urine: 1.028 (ref 1.005–1.030)
Urobilinogen, UA: 0.2 mg/dL (ref 0.0–1.0)
pH: 6 (ref 5.0–8.0)

## 2013-11-26 LAB — LIPASE, BLOOD: Lipase: 36 U/L (ref 11–59)

## 2013-11-26 LAB — PREGNANCY, URINE: Preg Test, Ur: NEGATIVE

## 2013-11-26 MED ORDER — ONDANSETRON HCL 4 MG PO TABS: 4.0000 mg | ORAL_TABLET | Freq: Four times a day (QID) | ORAL | Status: DC

## 2013-11-26 MED ORDER — FENTANYL CITRATE 0.05 MG/ML IJ SOLN
50.0000 ug | Freq: Once | INTRAMUSCULAR | Status: AC
  Administered 2013-11-26: 50 ug via INTRAVENOUS
  Filled 2013-11-26: qty 2

## 2013-11-26 MED ORDER — DIPHENHYDRAMINE HCL 50 MG/ML IJ SOLN
25.0000 mg | Freq: Once | INTRAMUSCULAR | Status: AC
  Administered 2013-11-26: 25 mg via INTRAVENOUS
  Filled 2013-11-26: qty 1

## 2013-11-26 MED ORDER — ONDANSETRON HCL 4 MG/2ML IJ SOLN
4.0000 mg | Freq: Once | INTRAMUSCULAR | Status: AC
  Administered 2013-11-26: 4 mg via INTRAVENOUS
  Filled 2013-11-26: qty 2

## 2013-11-26 MED ORDER — SODIUM CHLORIDE 0.9 % IV SOLN
1000.0000 mL | Freq: Once | INTRAVENOUS | Status: AC
  Administered 2013-11-26: 1000 mL via INTRAVENOUS

## 2013-11-26 MED ORDER — SODIUM CHLORIDE 0.9 % IV SOLN
Freq: Once | INTRAVENOUS | Status: AC
Start: 2013-11-26 — End: 2013-11-26
  Administered 2013-11-26: 21:00:00 via INTRAVENOUS

## 2013-11-26 NOTE — ED Notes (Signed)
Pt given update on wait.  Informed of wait time.  Pt encouraged to stay.

## 2013-11-26 NOTE — ED Notes (Signed)
Pt given update on wait time.

## 2013-11-26 NOTE — ED Notes (Signed)
Pt here with mid abdominal to lower abdominal pain that started last nite.  Pt reports nausea and diarrhea.  No urinary symptoms.  No vaginal bleeding or discharge.  LMP 11/19/13

## 2013-11-26 NOTE — ED Provider Notes (Signed)
CSN: [id number]     Arrival date & time 11/26/13  1237 History   First MD Initiated Contact with Patient 11/26/13 1940     Chief Complaint  Patient presents with  . Abdominal Pain   (Consider location/radiation/quality/duration/timing/severity/associated sxs/prior Treatment) Patient is a 28 y.o. female presenting with abdominal pain. The history is provided by the patient.  Abdominal Pain Pain location:  Epigastric Pain quality: aching, cramping and fullness   Pain radiates to:  Does not radiate Pain severity:  Moderate Onset quality:  Gradual Duration:  1 day Timing:  Constant Progression:  Unchanged Chronicity:  New Context: suspicious food intake   Relieved by:  Nothing Worsened by:  Eating Ineffective treatments:  None tried Associated symptoms: diarrhea, nausea and vomiting   Associated symptoms: no belching, no chest pain, no chills, no cough, no dysuria, no fever, no flatus, no hematemesis, no hematochezia, no hematuria, no melena, no shortness of breath, no vaginal bleeding and no vaginal discharge    Patient c/o onset of nausea, vomiting and diarrhea after eating at a [employer]. She is concerned she may have "food poisoning".  She reports two episodes of vomiting stomach contents and several episodes of loose stools.  Describes fullness and cramping pain to her upper abdomen.  Past Medical History  Diagnosis Date  . GERD (gastroesophageal reflux disease)   . Foot pain   . Gastritis [id number]   Past Surgical History  Procedure Laterality Date  . Foot surgery    . Wrist surgery    . Breast biopsy Right 05/03/13   No family history on file. History  Substance Use Topics  . Smoking status: Never Smoker   . Smokeless tobacco: Never Used  . Alcohol Use: Yes     Comment: occ   OB History   Grav Para Term Preterm Abortions TAB SAB Ect Mult Living   2 1        1      Obstetric Comments   1st Menstrual Cycle:  12 1st Pregnancy: 20     Review of Systems    Constitutional: Negative for fever and chills.  Respiratory: Negative for cough, chest tightness and shortness of breath.   Cardiovascular: Negative for chest pain.  Gastrointestinal: Positive for nausea, vomiting, abdominal pain and diarrhea. Negative for blood in stool, melena, hematochezia, abdominal distention, flatus and hematemesis.  Genitourinary: Negative for dysuria, hematuria, vaginal bleeding and vaginal discharge.  Musculoskeletal: Negative for arthralgias and myalgias.  Skin: Negative for rash.  Neurological: Negative for dizziness, syncope, weakness and numbness.  All other systems reviewed and are negative.    Allergies  Morphine and related  Home Medications   Current Outpatient Rx  Name  Route  Sig  Dispense  Refill  . cetirizine (ZYRTEC) 10 MG tablet   Oral   Take 10 mg by mouth daily.          . Multiple Vitamin (MULTIVITAMIN WITH MINERALS) TABS tablet   Oral   Take 1 tablet by mouth daily.         Marland Kitchen. omeprazole (PRILOSEC) 20 MG capsule   Oral   Take 20 mg by mouth daily.         Marland Kitchen. oxyCODONE-acetaminophen (PERCOCET) 5-325 MG per tablet   Oral   Take 1 tablet by mouth every 6 (six) hours as needed for severe pain. For pain         . phentermine 37.5 MG capsule   Oral   Take 37.5 mg by mouth every  morning.         Marland Kitchen EXPIRED: diphenhydrAMINE (BENADRYL) 25 mg capsule   Oral   Take 1 capsule (25 mg total) by mouth every 6 (six) hours as needed for itching.   30 capsule   0    BP 138/86  Pulse 85  Temp(Src) 97.7 F (36.5 C) (Oral)  Resp 24  Ht 5\' 6"  (1.676 m)  Wt 217 lb 6 oz (98.601 kg)  BMI 35.10 kg/m2  SpO2 100%  LMP 11/19/2013 Physical Exam  Nursing note and vitals reviewed. Constitutional: She is oriented to person, place, and time. She appears well-developed and well-nourished. No distress.  HENT:  Head: Normocephalic and atraumatic.  Mouth/Throat: Oropharynx is clear and moist.  Neck: Normal range of motion. Neck supple.   Cardiovascular: Normal rate, regular rhythm, normal heart sounds and intact distal pulses.   No murmur heard. Pulmonary/Chest: Effort normal and breath sounds normal. No respiratory distress.  Abdominal: Soft. Normal appearance and bowel sounds are normal. She exhibits no distension and no mass. There is no hepatosplenomegaly. There is tenderness in the epigastric area. There is no rebound, no guarding, no CVA tenderness and no tenderness at McBurney's point.    Musculoskeletal: Normal range of motion.  Neurological: She is alert and oriented to person, place, and time. She exhibits normal muscle tone. Coordination normal.  Skin: Skin is warm and dry.    ED Course  Procedures (including critical care time) Labs Review Labs Reviewed  CBC WITH DIFFERENTIAL - Abnormal; Notable for the following:    WBC 11.3 (*)    Neutrophils Relative % 84 (*)    Neutro Abs 9.5 (*)    All other components within normal limits  URINALYSIS, ROUTINE W REFLEX MICROSCOPIC - Abnormal; Notable for the following:    Protein, ur 30 (*)    All other components within normal limits  URINE MICROSCOPIC-ADD ON - Abnormal; Notable for the following:    Squamous Epithelial / LPF FEW (*)    Crystals CA OXALATE CRYSTALS (*)    All other components within normal limits  COMPREHENSIVE METABOLIC PANEL  LIPASE, BLOOD  PREGNANCY, URINE   Imaging Review No results found.  EKG Interpretation   None       MDM    Patient complaining of epigastric pain with diarrhea and vomiting. Pain began after eating at a restaurant. Vital signs are stable. Patient is well appearing. Will rehydrate with IV fluids and give anti-emetics  10:31 PM patient is feeling better. Requesting discharge. Has received medications, no further vomiting or diarrhea during ED stay. Likely gastroenteritis. Patient agrees to small amounts of fluids, bland diet, and close followup with her primary care physician. She appears stable for discharge.  Patient also requests referral information for GI regarding her splenomegaly which was seen on her previous ED visit and also present on [place] in 2012  Aasha Dina L. Trisha Mangle, PA-C 11/28/13 1610

## 2013-11-26 NOTE — Discharge Instructions (Signed)
Diarrhea Diarrhea is watery poop (stool). It can make you feel weak, tired, thirsty, or give you a dry mouth (signs of dehydration). Watery poop is a sign of another problem, most often an infection. It often lasts 2 3 days. It can last longer if it is a sign of something serious. Take care of yourself as told by your doctor. HOME CARE   Drink 1 cup (8 ounces) of fluid each time you have watery poop.  Do not drink the following fluids:  Those that contain simple sugars (fructose, glucose, galactose, lactose, sucrose, maltose).  Sports drinks.  Fruit juices.  Whole milk products.  Sodas.  Drinks with caffeine (coffee, tea, soda) or alcohol.  Oral rehydration solution may be used if the doctor says it is okay. You may make your own solution. Follow this recipe:    teaspoon table salt.   teaspoon baking soda.   teaspoon salt substitute containing potassium chloride.  1 tablespoons sugar.  1 liter (34 ounces) of water.  Avoid the following foods:  High fiber foods, such as raw fruits and vegetables.  Nuts, seeds, and whole grain breads and cereals.   Those that are sweetened with sugar alcohols (xylitol, sorbitol, mannitol).  Try eating the following foods:  Starchy foods, such as rice, toast, pasta, low-sugar cereal, oatmeal, baked potatoes, crackers, and bagels.  Bananas.  Applesauce.  Eat probiotic-rich foods, such as yogurt and milk products that are fermented.  Wash your hands well after each time you have watery poop.  Only take medicine as told by your doctor.  Take a warm bath to help lessen burning or pain from having watery poop. GET HELP RIGHT AWAY IF:   You cannot drink fluids without throwing up (vomiting).  You keep throwing up.  You have blood in your poop, or your poop looks black and tarry.  You do not pee (urinate) in 6 8 hours, or there is only a small amount of very dark pee.  You have belly (abdominal) pain that gets worse or stays in  the same spot (localizes).  You are weak, dizzy, confused, or lightheaded.  You have a very bad headache.  Your watery poop gets worse or does not get better.  You have a fever or lasting symptoms for more than 2 3 days.  You have a fever and your symptoms suddenly get worse. MAKE SURE YOU:   Understand these instructions.  Will watch your condition.  Will get help right away if you are not doing well or get worse. Document Released: 04/05/2008 Document Revised: 07/12/2012 Document Reviewed: 06/25/2012 [facility] Patient Information 2014 [place], [place].  Nausea and Vomiting Nausea is a sick feeling that often comes before throwing up (vomiting). Vomiting is a reflex where stomach contents come out of your mouth. Vomiting can cause severe loss of body fluids (dehydration). Children and elderly adults can become dehydrated quickly, especially if they also have diarrhea. Nausea and vomiting are symptoms of a condition or disease. It is important to find the cause of your symptoms. CAUSES   Direct irritation of the stomach lining. This irritation can result from increased acid production (gastroesophageal reflux disease), infection, food poisoning, taking certain medicines (such as nonsteroidal anti-inflammatory drugs), alcohol use, or tobacco use.  Signals from the brain.These signals could be caused by a headache, heat exposure, an inner ear disturbance, increased pressure in the brain from injury, infection, a tumor, or a concussion, pain, emotional stimulus, or metabolic problems.  An obstruction in the gastrointestinal tract (  bowel obstruction).  Illnesses such as diabetes, hepatitis, gallbladder problems, appendicitis, kidney problems, cancer, sepsis, atypical symptoms of a heart attack, or eating disorders.  Medical treatments such as chemotherapy and radiation.  Receiving medicine that makes you sleep (general anesthetic) during surgery. DIAGNOSIS Your caregiver may ask for  tests to be done if the problems do not improve after a few days. Tests may also be done if symptoms are severe or if the reason for the nausea and vomiting is not clear. Tests may include:  Urine tests.  Blood tests.  Stool tests.  Cultures (to look for evidence of infection).  X-rays or other imaging studies. Test results can help your caregiver make decisions about treatment or the need for additional tests. TREATMENT You need to stay well hydrated. Drink frequently but in small amounts.You may wish to drink water, sports drinks, clear broth, or eat frozen ice pops or gelatin dessert to help stay hydrated.When you eat, eating slowly may help prevent nausea.There are also some antinausea medicines that may help prevent nausea. HOME CARE INSTRUCTIONS   Take all medicine as directed by your caregiver.  If you do not have an appetite, do not force yourself to eat. However, you must continue to drink fluids.  If you have an appetite, eat a normal diet unless your caregiver tells you differently.  Eat a variety of complex carbohydrates (rice, wheat, potatoes, bread), lean meats, yogurt, fruits, and vegetables.  Avoid high-fat foods because they are more difficult to digest.  Drink enough water and fluids to keep your urine clear or pale yellow.  If you are dehydrated, ask your caregiver for specific rehydration instructions. Signs of dehydration may include:  Severe thirst.  Dry lips and mouth.  Dizziness.  Dark urine.  Decreasing urine frequency and amount.  Confusion.  Rapid breathing or pulse. SEEK IMMEDIATE MEDICAL CARE IF:   You have blood or brown flecks (like coffee grounds) in your vomit.  You have black or bloody stools.  You have a severe headache or stiff neck.  You are confused.  You have severe abdominal pain.  You have chest pain or trouble breathing.  You do not urinate at least once every 8 hours.  You develop cold or clammy skin.  You  continue to vomit for longer than 24 to 48 hours.  You have a fever. MAKE SURE YOU:   Understand these instructions.  Will watch your condition.  Will get help right away if you are not doing well or get worse. Document Released: 10/18/2005 Document Revised: 01/10/2012 Document Reviewed: 03/17/2011 [facility] Patient Information 2014 [place], [place].

## 2013-11-28 NOTE — ED Provider Notes (Signed)
Medical screening examination/treatment/procedure(s) were performed by non-physician practitioner and as supervising physician I was immediately available for consultation/collaboration.  EKG Interpretation   None         Gavin PoundMichael Y. Oletta LamasGhim, MD 11/28/13 [phone]

## 2014-01-04 ENCOUNTER — Telehealth: Payer: Self-pay | Admitting: Gastroenterology

## 2014-01-09 ENCOUNTER — Encounter (HOSPITAL_COMMUNITY): Payer: Self-pay | Admitting: Emergency Medicine

## 2014-01-09 ENCOUNTER — Encounter: Payer: Self-pay | Admitting: Gastroenterology

## 2014-01-09 ENCOUNTER — Emergency Department (HOSPITAL_COMMUNITY)
Admission: EM | Admit: 2014-01-09 | Discharge: 2014-01-09 | Disposition: A | Discharge status: Home / Self Care | Payer: Medicaid Other | Attending: Emergency Medicine | Admitting: Emergency Medicine

## 2014-01-09 DIAGNOSIS — K219 Gastro-esophageal reflux disease without esophagitis: Secondary | ICD-10-CM | POA: Insufficient documentation

## 2014-01-09 DIAGNOSIS — R109 Unspecified abdominal pain: Secondary | ICD-10-CM

## 2014-01-09 DIAGNOSIS — Z3202 Encounter for pregnancy test, result negative: Secondary | ICD-10-CM | POA: Insufficient documentation

## 2014-01-09 DIAGNOSIS — R161 Splenomegaly, not elsewhere classified: Secondary | ICD-10-CM

## 2014-01-09 DIAGNOSIS — Z79899 Other long term (current) drug therapy: Secondary | ICD-10-CM | POA: Insufficient documentation

## 2014-01-09 DIAGNOSIS — N898 Other specified noninflammatory disorders of vagina: Secondary | ICD-10-CM | POA: Insufficient documentation

## 2014-01-09 LAB — URINALYSIS, ROUTINE W REFLEX MICROSCOPIC
Bilirubin Urine: NEGATIVE
Glucose, UA: NEGATIVE mg/dL
Hgb urine dipstick: NEGATIVE
Ketones, ur: 15 mg/dL — AB
Leukocytes, UA: NEGATIVE
Nitrite: NEGATIVE
Protein, ur: NEGATIVE mg/dL
Specific Gravity, Urine: 1.028 (ref 1.005–1.030)
Urobilinogen, UA: 0.2 mg/dL (ref 0.0–1.0)
pH: 6 (ref 5.0–8.0)

## 2014-01-09 LAB — CBC WITH DIFFERENTIAL/PLATELET
Basophils Absolute: 0 10*3/uL (ref 0.0–0.1)
Basophils Relative: 0 % (ref 0–1)
Eosinophils Absolute: 0.1 10*3/uL (ref 0.0–0.7)
Eosinophils Relative: 1 % (ref 0–5)
HCT: 37 % (ref 36.0–46.0)
Hemoglobin: 12.2 g/dL (ref 12.0–15.0)
Lymphocytes Relative: 24 % (ref 12–46)
Lymphs Abs: 1.8 10*3/uL (ref 0.7–4.0)
MCH: 28.6 pg (ref 26.0–34.0)
MCHC: 33 g/dL (ref 30.0–36.0)
MCV: 86.9 fL (ref 78.0–100.0)
Monocytes Absolute: 0.6 10*3/uL (ref 0.1–1.0)
Monocytes Relative: 7 % (ref 3–12)
Neutro Abs: 5.3 10*3/uL (ref 1.7–7.7)
Neutrophils Relative %: 68 % (ref 43–77)
Platelets: 166 10*3/uL (ref 150–400)
RBC: 4.26 MIL/uL (ref 3.87–5.11)
RDW: 13.2 % (ref 11.5–15.5)
WBC: 7.8 10*3/uL (ref 4.0–10.5)

## 2014-01-09 LAB — COMPREHENSIVE METABOLIC PANEL
ALT: 16 U/L (ref 0–35)
AST: 17 U/L (ref 0–37)
Albumin: 3.5 g/dL (ref 3.5–5.2)
Alkaline Phosphatase: 72 U/L (ref 39–117)
BUN: 14 mg/dL (ref 6–23)
CO2: 27 mEq/L (ref 19–32)
Calcium: 9.2 mg/dL (ref 8.4–10.5)
Chloride: 106 mEq/L (ref 96–112)
Creatinine, Ser: 0.79 mg/dL (ref 0.50–1.10)
GFR calc Af Amer: >90 mL/min (ref >90)
GFR calc non Af Amer: >90 mL/min (ref >90)
Glucose, Bld: 96 mg/dL (ref 70–99)
Potassium: 4.4 mEq/L (ref 3.7–5.3)
Sodium: 142 mEq/L (ref 137–147)
Total Bilirubin: 0.2 mg/dL — ABNORMAL LOW (ref 0.3–1.2)
Total Protein: 6.5 g/dL (ref 6.0–8.3)

## 2014-01-09 LAB — POC URINE PREG, ED: Preg Test, Ur: NEGATIVE

## 2014-01-09 MED ORDER — HYDROCODONE-ACETAMINOPHEN 5-325 MG PO TABS: 1.0000 | ORAL_TABLET | Freq: Four times a day (QID) | ORAL | Status: DC | PRN

## 2014-01-09 MED ORDER — IBUPROFEN 600 MG PO TABS: 600.0000 mg | ORAL_TABLET | Freq: Four times a day (QID) | ORAL | Status: DC | PRN

## 2014-01-09 MED ORDER — ONDANSETRON 8 MG PO TBDP: 8.0000 mg | ORAL_TABLET | Freq: Three times a day (TID) | ORAL | Status: DC | PRN

## 2014-01-09 MED ORDER — HYDROCODONE-ACETAMINOPHEN 5-325 MG PO TABS
1.0000 | ORAL_TABLET | Freq: Once | ORAL | Status: AC
  Administered 2014-01-09: 1 via ORAL
  Filled 2014-01-09: qty 1

## 2014-01-09 MED ORDER — RANITIDINE HCL 150 MG PO TABS: 150.0000 mg | ORAL_TABLET | Freq: Two times a day (BID) | ORAL | Status: DC

## 2014-01-09 MED ORDER — GI COCKTAIL ~~LOC~~
30.0000 mL | Freq: Once | ORAL | Status: AC
  Administered 2014-01-09: 30 mL via ORAL
  Filled 2014-01-09: qty 30

## 2014-01-09 NOTE — ED Provider Notes (Signed)
CSN: [id number]     Arrival date & time 01/09/14  0144 History   First MD Initiated Contact with Patient 01/09/14 0308     Chief Complaint  Patient presents with  . Abdominal Pain     (Consider location/radiation/quality/duration/timing/severity/associated sxs/prior Treatment) HPI Comments: Pt comes in with cc of abd pain. No medical hx, no abd surgical hx. Pt reports that she has been having intermittent abd pain, left sided x 1 week. The pain is now constant. She has no nausea, emesis, diarrhea, uti like sx. Pt was seen in Jan with abd pain - and was noted to have enlarged spleen. Currently taking motrin for pain. Pt has + vaginal d/c, clear, but no pelvic pain, and had a STD screen via urine by her pcp a week ago which was neg.  Patient is a 28 y.o. female presenting with abdominal pain. The history is provided by the patient.  Abdominal Pain Associated symptoms: vaginal discharge   Associated symptoms: no chest pain, no dysuria, no nausea, no shortness of breath and no vomiting     Past Medical History  Diagnosis Date  . GERD (gastroesophageal reflux disease)   . Foot pain   . Gastritis [id number]   Past Surgical History  Procedure Laterality Date  . Foot surgery    . Wrist surgery    . Breast biopsy Right 05/03/13   No family history on file. History  Substance Use Topics  . Smoking status: Never Smoker   . Smokeless tobacco: Never Used  . Alcohol Use: Yes     Comment: occ   OB History   Grav Para Term Preterm Abortions TAB SAB Ect Mult Living   2 1        1      Obstetric Comments   1st Menstrual Cycle:  12 1st Pregnancy: 20     Review of Systems  Constitutional: Negative for activity change.  Respiratory: Negative for shortness of breath.   Cardiovascular: Negative for chest pain.  Gastrointestinal: Positive for abdominal pain. Negative for nausea and vomiting.  Genitourinary: Positive for flank pain and vaginal discharge. Negative for dysuria, urgency,  decreased urine volume and pelvic pain.  Musculoskeletal: Negative for neck pain.  Neurological: Negative for headaches.  All other systems reviewed and are negative.      Allergies  Morphine and related  Home Medications   Current Outpatient Rx  Name  Route  Sig  Dispense  Refill  . cetirizine (ZYRTEC) 10 MG tablet   Oral   Take 10 mg by mouth daily.          Marland Kitchen EXPIRED: diphenhydrAMINE (BENADRYL) 25 mg capsule   Oral   Take 1 capsule (25 mg total) by mouth every 6 (six) hours as needed for itching.   30 capsule   0   . Multiple Vitamin (MULTIVITAMIN WITH MINERALS) TABS tablet   Oral   Take 1 tablet by mouth daily.         Marland Kitchen omeprazole (PRILOSEC) 20 MG capsule   Oral   Take 20 mg by mouth daily.         . ondansetron (ZOFRAN) 4 MG tablet   Oral   Take 1 tablet (4 mg total) by mouth every 6 (six) hours.   12 tablet   0   . oxyCODONE-acetaminophen (PERCOCET) 5-325 MG per tablet   Oral   Take 1 tablet by mouth every 6 (six) hours as needed for severe pain. For pain         .  phentermine 37.5 MG capsule   Oral   Take 37.5 mg by mouth every morning.          BP 130/80  Pulse 84  Temp(Src) 98.8 F (37.1 C) (Oral)  Resp 16  Ht 5\' 6"  (1.676 m)  Wt 215 lb (97.523 kg)  BMI 34.72 kg/m2  SpO2 99%  LMP 12/14/2013 Physical Exam  Nursing note and vitals reviewed. Constitutional: She is oriented to person, place, and time. She appears well-developed and well-nourished.  HENT:  Head: Normocephalic and atraumatic.  Eyes: EOM are normal. Pupils are equal, round, and reactive to light.  Neck: Neck supple.  Cardiovascular: Normal rate, regular rhythm and normal heart sounds.   No murmur heard. Pulmonary/Chest: Effort normal. No respiratory distress.  Abdominal: Soft. She exhibits no distension. There is tenderness. There is no rebound and no guarding.  Mild periumbilical and epigastric and LUQ tenderness.  Neurological: She is alert and oriented to person,  place, and time.  Skin: Skin is warm and dry.    ED Course  Procedures (including critical care time) Labs Review Labs Reviewed  URINALYSIS, ROUTINE W REFLEX MICROSCOPIC - Abnormal; Notable for the following:    Ketones, ur 15 (*)    All other components within normal limits  COMPREHENSIVE METABOLIC PANEL - Abnormal; Notable for the following:    Total Bilirubin 0.2 (*)    All other components within normal limits  CBC WITH DIFFERENTIAL  POC URINE PREG, ED   Imaging Review No results found.   EKG Interpretation None      MDM   Final diagnoses:  None    Pt comes in with cc of abd pain. Left sided, epigastric. Recent US showed splenomegaly - she has not f/u yet.  UA is clean. Pt doesn't want pelvic exam as she was recently screened for STDs, so we will not evaluate the discharge any further.  Will give her some stronger analgesic and recommend pcp f/u.  Derwood KaplanAnkit Avaleigh Decuir, MD 01/09/14 [phone]

## 2014-01-09 NOTE — ED Notes (Signed)
Pt reports sharp pain to left side of abdomen that started last week, pt reports she was seen for the same 3 weeks ago and told her spleen was enlarged, has not followed up with gastro yet.  Pt also complaining of a headache onset yesterday afternoon.  Denies urinary or vaginal symptoms.  Alert and oriented X 4.

## 2014-01-09 NOTE — Discharge Instructions (Signed)
We saw you in the ER for the abdominal pain. All of our results are normal. You had an ultrasound that showed enlarged spleen in Jan. We are not sure what is causing your abdominal pain, and recommend that you see your primary care doctor within 2-3 days for further evaluation. If your symptoms get worse, return to the ER. Take the pain meds and nausea meds as prescribed.   Abdominal Pain, Women Abdominal (stomach, pelvic, or belly) pain can be caused by many things. It is important to tell your doctor:  The location of the pain.  Does it come and go or is it present all the time?  Are there things that start the pain (eating certain foods, exercise)?  Are there other symptoms associated with the pain (fever, nausea, vomiting, diarrhea)? All of this is helpful to know when trying to find the cause of the pain. CAUSES   Stomach: virus or bacteria infection, or ulcer.  Intestine: appendicitis (inflamed appendix), regional ileitis (Crohn's disease), ulcerative colitis (inflamed colon), irritable bowel syndrome, diverticulitis (inflamed diverticulum of the colon), or cancer of the stomach or intestine.  Gallbladder disease or stones in the gallbladder.  Kidney disease, kidney stones, or infection.  Pancreas infection or cancer.  Fibromyalgia (pain disorder).  Diseases of the female organs:  Uterus: fibroid (non-cancerous) tumors or infection.  Fallopian tubes: infection or tubal pregnancy.  Ovary: cysts or tumors.  Pelvic adhesions (scar tissue).  Endometriosis (uterus lining tissue growing in the pelvis and on the pelvic organs).  Pelvic congestion syndrome (female organs filling up with blood just before the menstrual period).  Pain with the menstrual period.  Pain with ovulation (producing an egg).  Pain with an IUD (intrauterine device, birth control) in the uterus.  Cancer of the female organs.  Functional pain (pain not caused by a disease, may improve without  treatment).  Psychological pain.  Depression. DIAGNOSIS  Your doctor will decide the seriousness of your pain by doing an examination.  Blood tests.  X-rays.  Ultrasound.  CT scan (computed tomography, special type of X-ray).  MRI (magnetic resonance imaging).  Cultures, for infection.  Barium enema (dye inserted in the large intestine, to better view it with X-rays).  Colonoscopy (looking in intestine with a lighted tube).  Laparoscopy (minor surgery, looking in abdomen with a lighted tube).  Major abdominal exploratory surgery (looking in abdomen with a large incision). TREATMENT  The treatment will depend on the cause of the pain.   Many cases can be observed and treated at home.  Over-the-counter medicines recommended by your caregiver.  Prescription medicine.  Antibiotics, for infection.  Birth control pills, for painful periods or for ovulation pain.  Hormone treatment, for endometriosis.  Nerve blocking injections.  Physical therapy.  Antidepressants.  Counseling with a psychologist or psychiatrist.  Minor or major surgery. HOME CARE INSTRUCTIONS   Do not take laxatives, unless directed by your caregiver.  Take over-the-counter pain medicine only if ordered by your caregiver. Do not take aspirin because it can cause an upset stomach or bleeding.  Try a clear liquid diet (broth or water) as ordered by your caregiver. Slowly move to a bland diet, as tolerated, if the pain is related to the stomach or intestine.  Have a thermometer and take your temperature several times a day, and record it.  Bed rest and sleep, if it helps the pain.  Avoid sexual intercourse, if it causes pain.  Avoid stressful situations.  Keep your follow-up appointments and tests,  as your caregiver orders.  If the pain does not go away with medicine or surgery, you may try:  Acupuncture.  Relaxation exercises (yoga, meditation).  Group therapy.  Counseling. SEEK  MEDICAL CARE IF:   You notice certain foods cause stomach pain.  Your home care treatment is not helping your pain.  You need stronger pain medicine.  You want your IUD removed.  You feel faint or lightheaded.  You develop nausea and vomiting.  You develop a rash.  You are having side effects or an allergy to your medicine. SEEK IMMEDIATE MEDICAL CARE IF:   Your pain does not go away or gets worse.  You have a fever.  Your pain is felt only in portions of the abdomen. The right side could possibly be appendicitis. The left lower portion of the abdomen could be colitis or diverticulitis.  You are passing blood in your stools (bright red or black tarry stools, with or without vomiting).  You have blood in your urine.  You develop chills, with or without a fever.  You pass out. MAKE SURE YOU:   Understand these instructions.  Will watch your condition.  Will get help right away if you are not doing well or get worse. Document Released: 08/15/2007 Document Revised: 01/10/2012 Document Reviewed: 09/04/2009 [facility] Patient Information 2014 [place], [place].  Abdominal Pain, Adult Many things can cause abdominal pain. Usually, abdominal pain is not caused by a disease and will improve without treatment. It can often be observed and treated at home. Your health care provider will do a physical exam and possibly order blood tests and X-rays to help determine the seriousness of your pain. However, in many cases, more time must pass before a clear cause of the pain can be found. Before that point, your health care provider may not know if you need more testing or further treatment. HOME CARE INSTRUCTIONS  Monitor your abdominal pain for any changes. The following actions may help to alleviate any discomfort you are experiencing:  Only take over-the-counter or prescription medicines as directed by your health care provider.  Do not take laxatives unless directed to do so by your  health care provider.  Try a clear liquid diet (broth, tea, or water) as directed by your health care provider. Slowly move to a bland diet as tolerated. SEEK MEDICAL CARE IF:  You have unexplained abdominal pain.  You have abdominal pain associated with nausea or diarrhea.  You have pain when you urinate or have a bowel movement.  You experience abdominal pain that wakes you in the night.  You have abdominal pain that is worsened or improved by eating food.  You have abdominal pain that is worsened with eating fatty foods. SEEK IMMEDIATE MEDICAL CARE IF:   Your pain does not go away within 2 hours.  You have a fever.  You keep throwing up (vomiting).  Your pain is felt only in portions of the abdomen, such as the right side or the left lower portion of the abdomen.  You pass bloody or black tarry stools. MAKE SURE YOU:  Understand these instructions.   Will watch your condition.   Will get help right away if you are not doing well or get worse.  Document Released: 07/28/2005 Document Revised: 08/08/2013 Document Reviewed: 06/27/2013 [facility] Patient Information 2014 [place].   Enlarged Spleen The spleen is an organ located in the upper abdomen under your left ribs. It is a spongelike organ, about the size of an orange,  which acts as a filter. The spleen is part of the lymph system and filters the blood. It removes old blood cells and abnormal blood cells. It is also part of the immune response and helps fight infections. An enlarged spleen (splenomegaly) is usually noticed when it is almost twice its normal size. CAUSES  There are many possible causes of an enlarged spleen. These causes include:  Infections (viral, bacterial, or parasitic).  Liver cirrhosis and other liver diseases.  Hemolytic anemia (types of anemia that lower your red blood cell count) and other blood diseases.  Hypersplenism (reduction in many types of blood cells by an enlarged  spleen).  Blood cancers (leukemia, Hodgkin's disease).  Metabolic disorders (Gaucher's disease, Niemann-Pick disease).  Tumors and cysts.  Pressure or blood clots in the veins of the spleen.  Connective tissue disorders (lupus, rheumatoid arthritis with Felty's syndrome). SYMPTOMS  An enlarged spleen may not always cause symptoms. If symptoms do occur, they may include:  Pain in the upper left abdomen (pain may spread to the left shoulder or get worse when you take a breath).  Feeling full without eating or eating only a small amount.  Feeling tired.  Chronic infections.  Bleeding easily. DIAGNOSIS  Tests may include:  Physical examination of the left upper abdomen.  Blood tests to check red and white blood cells and other proteins and enzymes.  Imaging tests, such as abdominal ultrasonography, computerized X-ray scan (computed tomography, CT), and computerized magnetic scan (magnetic resonance imaging, MRI).  Taking a tissue sample (biopsy) of the liver to examine it.  Examining a bone marrow biopsy sample. TREATMENT  Treatment varies depending on the cause of the enlarged spleen. Treatment aims to manage the conditions that cause swelling of the spleen and reduce the size of the spleen. Treatment may include:  Medications to eliminate infection or treat disease.  Radiation therapy.  Blood transfusions.  Vaccinations. If these treatments are not successful, or the cause cannot be determined, surgery to remove the spleen (splenectomy) may be recommended. HOME CARE INSTRUCTIONS   Take all medications as directed.  Take all antibiotics, even if you start to feel better. Discuss with your caregiver the use of a probiotic supplement to prevent stomach upset.  To avoid injury or a ruptured spleen:  Limit activities as directed.  Avoid contact sports.  Wear your seatbelt in the car.  See your caregiver for vaccinations, follow up examinations and testing as  directed.  Follow all of your caregiver's instructions on managing the conditions that cause your enlarged spleen. PREVENTION  It is not always possible to prevent an enlarged spleen. Reduce your chances of developing an enlarged spleen:  Practice good hygiene to prevent infection.  Get recommended vaccines to prevent infection. SEEK MEDICAL CARE IF:   You develop a fever (more than 100.69F [38.1 C]) or other signs of infection (chills, feeling unwell).  You experience injury or impact to the spleen area.  Your symptoms do not go away as you and your doctor expected.  You experience increased pain when you take in a breath.  Your symptoms worsen, or you develop new symptoms. MAKE SURE YOU:   Understand these instructions.  Will watch your condition.  Will get help right away if you are not doing well or get worse. Follow up with your caregiver to find out the results of your tests. Not all test results may be available during your visit. If your test results are not back during the visit, make an appointment  with your caregiver to find out the results. Do not assume everything is normal if you have not heard from your caregiver or the medical facility. It is important for you to follow up on all of your test results.  Document Released: 04/07/2010 Document Revised: 01/10/2012 Document Reviewed: 04/07/2010 [facility] Patient Information 2014 [place].

## 2014-02-21 ENCOUNTER — Ambulatory Visit (INDEPENDENT_AMBULATORY_CARE_PROVIDER_SITE_OTHER): Payer: Medicaid Other | Admitting: Gastroenterology

## 2014-02-21 ENCOUNTER — Encounter: Payer: Self-pay | Admitting: Gastroenterology

## 2014-02-21 VITALS — BP 120/80 | HR 92 | Ht 66.0 in | Wt 223.0 lb

## 2014-02-21 DIAGNOSIS — F411 Generalized anxiety disorder: Secondary | ICD-10-CM

## 2014-02-21 DIAGNOSIS — R161 Splenomegaly, not elsewhere classified: Secondary | ICD-10-CM

## 2014-02-21 DIAGNOSIS — K625 Hemorrhage of anus and rectum: Secondary | ICD-10-CM

## 2014-02-21 DIAGNOSIS — R1012 Left upper quadrant pain: Secondary | ICD-10-CM

## 2014-02-21 MED ORDER — HYOSCYAMINE SULFATE 0.125 MG SL SUBL: 0.2500 mg | SUBLINGUAL_TABLET | SUBLINGUAL | Status: DC | PRN

## 2014-02-21 NOTE — Assessment & Plan Note (Signed)
Splenomegaly is an incidental finding by CT and ultrasound.  There's been no significant change in size of the spleen as determined by serial ultrasounds in 2012 and 2015.  Blood counts are normal including hemoglobin and platelet counts.  I don't think splenomegaly is functionally significant.

## 2014-02-21 NOTE — Patient Instructions (Signed)
Follow up in 8 weeks Prescription will be sent to your pharmacy

## 2014-02-21 NOTE — Assessment & Plan Note (Signed)
Limited rectal bleeding is probably hemorrhoidal.  Symptoms are minimal.  Should they worsen may consider band ligation

## 2014-02-21 NOTE — Assessment & Plan Note (Signed)
Complains of abdominal pain beginning in both flanks suggests musculoskeletal pain.  Visceral pain is less likely.  Prior workup including endoscopy and colonoscopy were not significant.  Doubt underlying GI pathology.  Recommendations #1 trial of hyomax sublingual when necessary

## 2014-02-21 NOTE — Progress Notes (Addendum)
_                                                                                                                History of Present Illness: 28 year old white female referred for evaluation of abdominal pain.  Since 2012 she has been complaining of intermittent sharp left flank pain with radiation to her left upper quadrant, and similar pain beginning in the right flank.  Pain is described as sharp and may last up to an hour.  There are no aggravating or ameliorating conditions.  It is unassociated with eating or bowel movements.  Prior evaluation includes CT and ultrasound which had demonstrated splenomegaly.  Upper endoscopy in 2012 demonstrated mild gastritis.  Colonoscopy 2012 demonstrated internal hemorrhoids.  She's had very intermittent limited rectal bleeding consisting of blood in the toilet water or on the tissue.  Blood work in March, 2015 was pertinent for a normal CMET and CBC.  Patient has a history of maternal anxiety disorder she denies dysuria or urinary frequency.    Past Medical History  Diagnosis Date  . GERD (gastroesophageal reflux disease)   . Foot pain   . Gastritis [id number]  . Seasonal allergies    Past Surgical History  Procedure Laterality Date  . Foot surgery    . Wrist surgery    . Breast biopsy Right 05/03/13   family history includes Diabetes in her father and maternal grandmother; Hypertension in her father; Kidney failure in her maternal grandmother; Liver cancer in her paternal grandfather. Current Outpatient Prescriptions  Medication Sig Dispense Refill  . cetirizine (ZYRTEC) 10 MG tablet Take 10 mg by mouth daily.       Marland Kitchen. ibuprofen (ADVIL,MOTRIN) 600 MG tablet Take 1 tablet (600 mg total) by mouth every 6 (six) hours as needed.  30 tablet  0  . Multiple Vitamin (MULTIVITAMIN WITH MINERALS) TABS tablet Take 1 tablet by mouth daily.      Marland Kitchen. omeprazole (PRILOSEC) 20 MG capsule Take 20 mg by mouth daily.      . ondansetron (ZOFRAN ODT) 8 MG  disintegrating tablet Take 1 tablet (8 mg total) by mouth every 8 (eight) hours as needed for nausea.  20 tablet  0  . oxyCODONE-acetaminophen (PERCOCET) 5-325 MG per tablet Take 1 tablet by mouth every 6 (six) hours as needed for severe pain. For pain      . phentermine 37.5 MG capsule Take 37.5 mg by mouth every morning.      . ranitidine (ZANTAC) 150 MG tablet Take 1 tablet (150 mg total) by mouth 2 (two) times daily.  60 tablet  0  . diphenhydrAMINE (BENADRYL) 25 mg capsule Take 1 capsule (25 mg total) by mouth every 6 (six) hours as needed for itching.  30 capsule  0   No current facility-administered medications for this visit.   Allergies as of 02/21/2014 - Review Complete 02/21/2014  Allergen Reaction Noted  . Morphine and related Other (See Comments) 09/18/2011    reports that she has never smoked.  She has never used smokeless tobacco. She reports that she drinks alcohol. She reports that she does not use illicit drugs.     Review of Systems: Pertinent positive and negative review of systems were noted in the above HPI section. All other review of systems were otherwise negative.  Vital signs were reviewed in today's medical record Physical Exam: General: Well developed , well nourished, no acute distress Skin: anicteric Head: Normocephalic and atraumatic Eyes:  sclerae anicteric, EOMI Ears: Normal auditory acuity Mouth: No deformity or lesions Neck: Supple, no masses or thyromegaly Lungs: There is a rare expiratory wheeze Heart: Regular rate and rhythm; no murmurs, rubs or bruits Abdomen: Soft, non tender and non distended. No masses, hepatosplenomegaly or hernias noted. Normal Bowel sounds.  There is no CVA tenderness. Rectal:deferred Musculoskeletal: Symmetrical with no gross deformities  Skin: No lesions on visible extremities Pulses:  Normal pulses noted Extremities: No clubbing, cyanosis, edema or deformities noted Neurological: Alert oriented x 4, grossly  nonfocal Cervical Nodes:  No significant cervical adenopathy Inguinal Nodes: No significant inguinal adenopathy Psychological:  Alert and cooperative. Normal mood and affect  See Assessment and Plan under Problem List

## 2014-02-26 NOTE — Telephone Encounter (Signed)
Pt seen

## 2014-03-08 ENCOUNTER — Other Ambulatory Visit: Payer: Self-pay

## 2014-04-13 ENCOUNTER — Encounter (HOSPITAL_COMMUNITY): Payer: Self-pay | Admitting: Emergency Medicine

## 2014-04-13 ENCOUNTER — Emergency Department (HOSPITAL_COMMUNITY): Payer: Medicaid Other

## 2014-04-13 ENCOUNTER — Emergency Department (HOSPITAL_COMMUNITY)
Admission: EM | Admit: 2014-04-13 | Discharge: 2014-04-13 | Disposition: A | Discharge status: Home / Self Care | Payer: Medicaid Other | Attending: Emergency Medicine | Admitting: Emergency Medicine

## 2014-04-13 DIAGNOSIS — K2970 Gastritis, unspecified, without bleeding: Secondary | ICD-10-CM | POA: Insufficient documentation

## 2014-04-13 DIAGNOSIS — K2990 Gastroduodenitis, unspecified, without bleeding: Secondary | ICD-10-CM

## 2014-04-13 DIAGNOSIS — Z79899 Other long term (current) drug therapy: Secondary | ICD-10-CM | POA: Insufficient documentation

## 2014-04-13 DIAGNOSIS — R519 Headache, unspecified: Secondary | ICD-10-CM

## 2014-04-13 DIAGNOSIS — R51 Headache: Secondary | ICD-10-CM | POA: Insufficient documentation

## 2014-04-13 DIAGNOSIS — K219 Gastro-esophageal reflux disease without esophagitis: Secondary | ICD-10-CM | POA: Insufficient documentation

## 2014-04-13 DIAGNOSIS — Z8709 Personal history of other diseases of the respiratory system: Secondary | ICD-10-CM | POA: Insufficient documentation

## 2014-04-13 LAB — CBC
HCT: 40.9 % (ref 36.0–46.0)
Hemoglobin: 13.4 g/dL (ref 12.0–15.0)
MCH: 28 pg (ref 26.0–34.0)
MCHC: 32.8 g/dL (ref 30.0–36.0)
MCV: 85.4 fL (ref 78.0–100.0)
Platelets: 228 10*3/uL (ref 150–400)
RBC: 4.79 MIL/uL (ref 3.87–5.11)
RDW: 13.3 % (ref 11.5–15.5)
WBC: 7.7 10*3/uL (ref 4.0–10.5)

## 2014-04-13 LAB — BASIC METABOLIC PANEL
BUN: 11 mg/dL (ref 6–23)
CO2: 26 mEq/L (ref 19–32)
Calcium: 9.7 mg/dL (ref 8.4–10.5)
Chloride: 101 mEq/L (ref 96–112)
Creatinine, Ser: 0.68 mg/dL (ref 0.50–1.10)
GFR calc Af Amer: >90 mL/min (ref >90)
GFR calc non Af Amer: >90 mL/min (ref >90)
Glucose, Bld: 100 mg/dL — ABNORMAL HIGH (ref 70–99)
Potassium: 4.5 mEq/L (ref 3.7–5.3)
Sodium: 139 mEq/L (ref 137–147)

## 2014-04-13 LAB — I-STAT TROPONIN, ED: Troponin i, poc: 0 ng/mL (ref 0.00–0.08)

## 2014-04-13 MED ORDER — METOCLOPRAMIDE HCL 5 MG/ML IJ SOLN
10.0000 mg | Freq: Once | INTRAMUSCULAR | Status: AC
  Administered 2014-04-13: 10 mg via INTRAVENOUS
  Filled 2014-04-13: qty 2

## 2014-04-13 MED ORDER — SODIUM CHLORIDE 0.9 % IV BOLUS (SEPSIS)
1000.0000 mL | Freq: Once | INTRAVENOUS | Status: AC
  Administered 2014-04-13: 1000 mL via INTRAVENOUS

## 2014-04-13 MED ORDER — FAMOTIDINE IN NACL 20-0.9 MG/50ML-% IV SOLN
20.0000 mg | Freq: Once | INTRAVENOUS | Status: AC
  Administered 2014-04-13: 20 mg via INTRAVENOUS
  Filled 2014-04-13: qty 50

## 2014-04-13 MED ORDER — KETOROLAC TROMETHAMINE 30 MG/ML IJ SOLN
30.0000 mg | Freq: Once | INTRAMUSCULAR | Status: AC
  Administered 2014-04-13: 30 mg via INTRAVENOUS
  Filled 2014-04-13: qty 1

## 2014-04-13 MED ORDER — GI COCKTAIL ~~LOC~~
30.0000 mL | Freq: Once | ORAL | Status: AC
  Administered 2014-04-13: 30 mL via ORAL
  Filled 2014-04-13: qty 30

## 2014-04-13 NOTE — Discharge Instructions (Signed)
Gastritis, Adult  Gastritis is soreness and puffiness (inflammation) of the lining of the stomach. If you do not get help, gastritis can cause bleeding and sores (ulcers) in the stomach.  HOME CARE   · Only take medicine as told by your doctor.  · If you were given antibiotic medicines, take them as told. Finish the medicines even if you start to feel better.  · Drink enough fluids to keep your pee (urine) clear or pale yellow.  · Avoid foods and drinks that make your problems worse. Foods you may want to avoid include:  · Caffeine or alcohol.  · Chocolate.  · Mint.  · Garlic and onions.  · Spicy foods.  · Citrus fruits, including oranges, lemons, or limes.  · Food containing tomatoes, including sauce, chili, salsa, and pizza.  · Fried and fatty foods.  · Eat small meals throughout the day instead of large meals.  GET HELP RIGHT AWAY IF:   · You have black or dark red poop (stools).  · You throw up (vomit) blood. It may look like coffee grounds.  · You cannot keep fluids down.  · Your belly (abdominal) pain gets worse.  · You have a fever.  · You do not feel better after 1 week.  · You have any other questions or concerns.  MAKE SURE YOU:   · Understand these instructions.  · Will watch your condition.  · Will get help right away if you are not doing well or get worse.  Document Released: 04/05/2008 Document Revised: 01/10/2012 Document Reviewed: 12/01/2011  [employer]® Patient Information ©2014 [employer].

## 2014-04-13 NOTE — ED Notes (Signed)
C/o headache and heartburn x 2 days.  Taking acid reflux meds without relief.  Denies sob.  Reports nausea earlier today and vomited x 2.  Denies nausea at present.

## 2014-04-13 NOTE — ED Notes (Signed)
Dr. Manly at the bedside.  

## 2014-04-13 NOTE — ED Provider Notes (Signed)
CSN: [id number]     Arrival date & time 04/13/14  0023 History   First MD Initiated Contact with Patient 04/13/14 0254     Chief Complaint  Patient presents with  . Headache  . Heartburn     (Consider location/radiation/quality/duration/timing/severity/associated sxs/prior Treatment) HPI  Patient is a 28 year old woman with a history of gastroesophageal reflux disease and gastritis. She presents with a complaint of heartburn. The patient says she has had severe heartburn for the past several hours. She has a burning sensation which radiates from her epigastric region to her chest. She has been nauseated and she vomited twice this morning. She has not had diarrhea. Her pain is moderately severe. She also complains a headache which is diffuse and bilateral. She reports that her by mouth intake has been mildly diminished. She has no neurologic deficits or any other neurologic symptoms. This is not the worst headache of her life. No neck pain. No fever.  The patient is prescribed omeprazole and states she is compliant with his medicine. Denies history of pancreatitis and gallbladder disease.   Past Medical History  Diagnosis Date  . GERD (gastroesophageal reflux disease)   . Foot pain   . Gastritis [id number]  . Seasonal allergies    Past Surgical History  Procedure Laterality Date  . Foot surgery    . Wrist surgery    . Breast biopsy Right 05/03/13   Family History  Problem Relation Age of Onset  . Hypertension Father   . Diabetes Father   . Liver cancer Paternal Grandfather   . Diabetes Maternal Grandmother   . Kidney failure Maternal Grandmother    History  Substance Use Topics  . Smoking status: Never Smoker   . Smokeless tobacco: Never Used  . Alcohol Use: Yes     Comment: occ   OB History   Grav Para Term Preterm Abortions TAB SAB Ect Mult Living   2 1        1      Obstetric Comments   1st Menstrual Cycle:  12 1st Pregnancy: 20     Review of Systems Ten point  review of symptoms performed and is negative with the exception of symptoms noted above.     Allergies  Morphine and related  Home Medications   Prior to Admission medications   Medication Sig Start Date End Date Taking? Authorizing Provider  cetirizine (ZYRTEC) 10 MG tablet Take 10 mg by mouth daily.    Yes Historical Provider, MD  diphenhydrAMINE (BENADRYL) 25 mg capsule Take 1 capsule (25 mg total) by mouth every 6 (six) hours as needed for itching. 07/04/12 04/13/14 Yes Johnsie Kindredarmen L Chatten, NP  omeprazole (PRILOSEC) 20 MG capsule Take 20 mg by mouth daily.   Yes Historical Provider, MD  oxyCODONE-acetaminophen (PERCOCET) 5-325 MG per tablet Take 1 tablet by mouth every 6 (six) hours as needed for severe pain. For pain   Yes Historical Provider, MD  PARoxetine (PAXIL) 30 MG tablet Take 30 mg by mouth daily as needed (anxiety).   Yes Historical Provider, MD   BP 155/102  Pulse 85  Temp(Src) 98.4 F (36.9 C) (Oral)  Resp 22  SpO2 99%  LMP 03/24/2014 Physical Exam Gen: well developed and well nourished appearing Head: NCAT Eyes: PERL, EOMI Nose: no epistaixis or rhinorrhea Mouth/throat: mucosa is moist and pink Neck: supple, no stridor Lungs: CTA B, no wheezing, rhonchi or rales CV: RRR, no murmur, extremities appear well perfused.  Abd: soft, notender, nondistended Back: no ttp,  no cva ttp Skin: warm and dry Ext: normal to inspection, no dependent edema Neuro: CN ii-xii grossly intact, no focal deficits Psyche; normal affect,  calm and cooperative.   ED Course  Procedures (including critical care time) Labs Review  Results for orders placed during the hospital encounter of 04/13/14 (from the past 24 hour(s))  CBC     Status: None   Collection Time    04/13/14 12:40 AM      Result Value Ref Range   WBC 7.7  4.0 - 10.5 K/uL   RBC 4.79  3.87 - 5.11 MIL/uL   Hemoglobin 13.4  12.0 - 15.0 g/dL   HCT 16.140.9  09.636.0 - 04.546.0 %   MCV 85.4  78.0 - 100.0 fL   MCH 28.0  26.0 - 34.0 pg    MCHC 32.8  30.0 - 36.0 g/dL   RDW 40.913.3  81.111.5 - 91.415.5 %   Platelets 228  150 - 400 K/uL  BASIC METABOLIC PANEL     Status: Abnormal   Collection Time    04/13/14 12:40 AM      Result Value Ref Range   Sodium 139  137 - 147 mEq/L   Potassium 4.5  3.7 - 5.3 mEq/L   Chloride 101  96 - 112 mEq/L   CO2 26  19 - 32 mEq/L   Glucose, Bld 100 (*) 70 - 99 mg/dL   BUN 11  6 - 23 mg/dL   Creatinine, Ser 7.820.68  0.50 - 1.10 mg/dL   Calcium 9.7  8.4 - 95.610.5 mg/dL   GFR calc non Af Amer >90  >90 mL/min   GFR calc Af Amer >90  >90 mL/min  I-STAT TROPOININ, ED     Status: None   Collection Time    04/13/14 12:44 AM      Result Value Ref Range   Troponin i, poc 0.00  0.00 - 0.08 ng/mL   Comment 3             Imaging Review Dg Chest 2 View  04/13/2014   CLINICAL DATA:  Epigastric abdominal pain for 2 days. Shortness of breath.  EXAM: CHEST  2 VIEW  COMPARISON:  Chest radiograph performed 04/01/2009  FINDINGS: The lungs are well-aerated and clear. There is no evidence of focal opacification, pleural effusion or pneumothorax. Bilateral nipple shadows are seen.  The heart is normal in size; the mediastinal contour is within normal limits. No acute osseous abnormalities are seen.  IMPRESSION: No acute cardiopulmonary process seen.   Electronically Signed   By: Roanna RaiderJeffery  Chang M.D.   On: 04/13/2014 02:11    EKG: nsr, no acute ischemic changes, normal intervals, normal axis, normal qrs complex  MDM  DDX: gastritis, PUD, GERD, pancreatitis, gallbladder disease, SBO, colitis,  enteritis.  The patient also appears to be suffering from     Brandt LoosenJulie Yuma Blucher, MD 04/14/14 2316

## 2014-08-15 ENCOUNTER — Emergency Department (HOSPITAL_COMMUNITY)
Admission: EM | Admit: 2014-08-15 | Discharge: 2014-08-15 | Disposition: A | Discharge status: Home / Self Care | Payer: Medicaid Other | Source: Home / Self Care | Attending: Family Medicine | Admitting: Family Medicine

## 2014-08-15 ENCOUNTER — Encounter (HOSPITAL_COMMUNITY): Payer: Self-pay | Admitting: Emergency Medicine

## 2014-08-15 DIAGNOSIS — J208 Acute bronchitis due to other specified organisms: Secondary | ICD-10-CM

## 2014-08-15 MED ORDER — IPRATROPIUM-ALBUTEROL 0.5-2.5 (3) MG/3ML IN SOLN
RESPIRATORY_TRACT | Status: AC
  Filled 2014-08-15: qty 3

## 2014-08-15 MED ORDER — TRAMADOL HCL 50 MG PO TABS: 50.0000 mg | ORAL_TABLET | Freq: Every evening | ORAL | Status: DC | PRN

## 2014-08-15 MED ORDER — IPRATROPIUM-ALBUTEROL 0.5-2.5 (3) MG/3ML IN SOLN
3.0000 mL | Freq: Once | RESPIRATORY_TRACT | Status: AC
  Administered 2014-08-15: 3 mL via RESPIRATORY_TRACT

## 2014-08-15 MED ORDER — PREDNISONE 10 MG PO TABS: 30.0000 mg | ORAL_TABLET | Freq: Every day | ORAL | Status: DC

## 2014-08-15 MED ORDER — IPRATROPIUM BROMIDE 0.06 % NA SOLN: 2.0000 | Freq: Four times a day (QID) | NASAL | Status: DC

## 2014-08-15 NOTE — ED Notes (Signed)
C/o  Productive cough with green sputum, chest soreness.  X 2 days.   Denies fever, n/v/d.  No relief with otc meds.

## 2014-08-15 NOTE — ED Provider Notes (Signed)
[name] is a 28 y.o. female who presents to Urgent Care today for cough. Patient has a two-day history of productive cough wheezing and chest soreness. The pain is worse with deep inspiration and coughing. No exertional component. No fevers or chills nausea vomiting or diarrhea. Patient has tried over-the-counter medications which have helped some.   Past Medical History  Diagnosis Date  . GERD (gastroesophageal reflux disease)   . Foot pain   . Gastritis [id number]  . Seasonal allergies    History  Substance Use Topics  . Smoking status: Never Smoker   . Smokeless tobacco: Never Used  . Alcohol Use: Yes     Comment: occ   ROS as above Medications: No current facility-administered medications for this encounter.   Current Outpatient Prescriptions  Medication Sig Dispense Refill  . cetirizine (ZYRTEC) 10 MG tablet Take 10 mg by mouth daily.       Marland Kitchen. omeprazole (PRILOSEC) 20 MG capsule Take 20 mg by mouth daily.      Marland Kitchen. oxyCODONE-acetaminophen (PERCOCET) 5-325 MG per tablet Take 1 tablet by mouth every 6 (six) hours as needed for severe pain. For pain      . PARoxetine (PAXIL) 30 MG tablet Take 30 mg by mouth daily as needed (anxiety).      Marland Kitchen. diphenhydrAMINE (BENADRYL) 25 mg capsule Take 1 capsule (25 mg total) by mouth every 6 (six) hours as needed for itching.  30 capsule  0    Exam:  BP 130/91  Pulse 80  Temp(Src) 99.3 F (37.4 C) (Oral)  Resp 18  SpO2 99%  LMP 08/15/2014 Gen: Well NAD HEENT: EOMI,  MMM posterior pharynx with cobblestoning. Normal tympanic membranes bilaterally. Lungs: Normal work of breathing. CTABL Heart: RRR no MRG Abd: NABS, Soft. Nondistended, Nontender Exts: Brisk capillary refill, warm and well perfused.   Patient was given a 2.5/0.5 mg DuoNeb nebulizer treatment, and felt a bit better  No results found for this or any previous visit (from the past 24 hour(s)). No results found.  Assessment and Plan: 28 y.o. female with bronchitis.  Treatment with prednisone tramadol for cough and Atrovent nasal spray  Discussed warning signs or symptoms. Please see discharge instructions. Patient expresses understanding.     Rodolph BongEvan S Tobi Leinweber, MD 08/15/14 [phone]

## 2014-08-15 NOTE — Discharge Instructions (Signed)
Thank you for coming in today. Call or go to the emergency room if you get worse, have trouble breathing, have chest pains, or palpitations.   Do not drive after taking tramadol.  Come back or go to the emergency room if you notice new weakness new numbness problems walking or bowel or bladder problems.   Acute Bronchitis Bronchitis is inflammation of the airways that extend from the windpipe into the lungs (bronchi). The inflammation often causes mucus to develop. This leads to a cough, which is the most common symptom of bronchitis.  In acute bronchitis, the condition usually develops suddenly and goes away over time, usually in a couple weeks. Smoking, allergies, and asthma can make bronchitis worse. Repeated episodes of bronchitis may cause further lung problems.  CAUSES Acute bronchitis is most often caused by the same virus that causes a cold. The virus can spread from person to person (contagious) through coughing, sneezing, and touching contaminated objects. SIGNS AND SYMPTOMS   Cough.   Fever.   Coughing up mucus.   Body aches.   Chest congestion.   Chills.   Shortness of breath.   Sore throat.  DIAGNOSIS  Acute bronchitis is usually diagnosed through a physical exam. Your health care provider will also ask you questions about your medical history. Tests, such as chest X-rays, are sometimes done to rule out other conditions.  TREATMENT  Acute bronchitis usually goes away in a couple weeks. Oftentimes, no medical treatment is necessary. Medicines are sometimes given for relief of fever or cough. Antibiotic medicines are usually not needed but may be prescribed in certain situations. In some cases, an inhaler may be recommended to help reduce shortness of breath and control the cough. A cool mist vaporizer may also be used to help thin bronchial secretions and make it easier to clear the chest.  HOME CARE INSTRUCTIONS  Get plenty of rest.   Drink enough fluids to  keep your urine clear or pale yellow (unless you have a medical condition that requires fluid restriction). Increasing fluids may help thin your respiratory secretions (sputum) and reduce chest congestion, and it will prevent dehydration.   Take medicines only as directed by your health care provider.  If you were prescribed an antibiotic medicine, finish it all even if you start to feel better.  Avoid smoking and secondhand smoke. Exposure to cigarette smoke or irritating chemicals will make bronchitis worse. If you are a smoker, consider using nicotine gum or skin patches to help control withdrawal symptoms. Quitting smoking will help your lungs heal faster.   Reduce the chances of another bout of acute bronchitis by washing your hands frequently, avoiding people with cold symptoms, and trying not to touch your hands to your mouth, nose, or eyes.   Keep all follow-up visits as directed by your health care provider.  SEEK MEDICAL CARE IF: Your symptoms do not improve after 1 week of treatment.  SEEK IMMEDIATE MEDICAL CARE IF:  You develop an increased fever or chills.   You have chest pain.   You have severe shortness of breath.  You have bloody sputum.   You develop dehydration.  You faint or repeatedly feel like you are going to pass out.  You develop repeated vomiting.  You develop a severe headache. MAKE SURE YOU:   Understand these instructions.  Will watch your condition.  Will get help right away if you are not doing well or get worse. Document Released: 11/25/2004 Document Revised: 03/04/2014 Document Reviewed: 04/10/2013 [employer]  Patient Information ©2015 [employer]. This information is not intended to replace advice given to you by your health care provider. Make sure you discuss any questions you have with your health care provider. ° °

## 2014-09-02 ENCOUNTER — Encounter (HOSPITAL_COMMUNITY): Payer: Self-pay | Admitting: Emergency Medicine

## 2014-09-08 ENCOUNTER — Encounter (HOSPITAL_COMMUNITY): Payer: Self-pay | Admitting: Emergency Medicine

## 2014-09-08 ENCOUNTER — Emergency Department (HOSPITAL_COMMUNITY)
Admission: EM | Admit: 2014-09-08 | Discharge: 2014-09-08 | Disposition: A | Discharge status: Home / Self Care | Payer: Medicaid Other | Source: Home / Self Care | Attending: Emergency Medicine | Admitting: Emergency Medicine

## 2014-09-08 DIAGNOSIS — J020 Streptococcal pharyngitis: Secondary | ICD-10-CM | POA: Diagnosis not present

## 2014-09-08 LAB — POCT RAPID STREP A: Streptococcus, Group A Screen (Direct): POSITIVE — AB

## 2014-09-08 MED ORDER — ACETAMINOPHEN 325 MG PO TABS
ORAL_TABLET | ORAL | Status: AC
  Filled 2014-09-08: qty 3

## 2014-09-08 MED ORDER — AMOXICILLIN 250 MG/5ML PO SUSR: 500.0000 mg | Freq: Two times a day (BID) | ORAL | Status: DC

## 2014-09-08 MED ORDER — HYDROCODONE-HOMATROPINE 5-1.5 MG/5ML PO SYRP: 5.0000 mL | ORAL_SOLUTION | Freq: Four times a day (QID) | ORAL | Status: DC | PRN

## 2014-09-08 MED ORDER — ACETAMINOPHEN 325 MG PO TABS
650.0000 mg | ORAL_TABLET | Freq: Once | ORAL | Status: AC
  Administered 2014-09-08: 650 mg via ORAL

## 2014-09-08 NOTE — Discharge Instructions (Signed)
You have strep throat. Take Amoxicillin 500mg  twice a day for 10 days. Use ibuprofen or tylenol for fevers. Use the Hycodan cough syrup as needed.  Do not take this medication if driving. Drink plenty of fluids. Get as much rest as you can.  If you are unable to drink fluids, continue to have fevers, or are not improving in 24-48 hours please go to the emergency room.

## 2014-09-08 NOTE — ED Notes (Signed)
C/o  Sore throat.  Headache. Nausea.   And fever x 2 days.   No relief with otc meds.  Denies vomiting and diarrhea.

## 2014-09-08 NOTE — ED Provider Notes (Signed)
CSN: [id number]     Arrival date & time 09/08/14  1409 History   First MD Initiated Contact with Patient 09/08/14 1457     Chief Complaint  Patient presents with  . Sore Throat   (Consider location/radiation/quality/duration/timing/severity/associated sxs/prior Treatment) HPI  She is a 28 year old woman here for evaluation of sore throat. Her symptoms started 2 days ago. She describes a sore throat, it is associated with headache, fever, nausea. She does report a cough. No shortness of breath. No vomiting or diarrhea. She is able to drink fluids well. She has tried Chloraseptic spray with some improvement.  Past Medical History  Diagnosis Date  . GERD (gastroesophageal reflux disease)   . Foot pain   . Gastritis [id number]  . Seasonal allergies    Past Surgical History  Procedure Laterality Date  . Foot surgery    . Wrist surgery    . Breast biopsy Right 05/03/13   Family History  Problem Relation Age of Onset  . Hypertension Father   . Diabetes Father   . Liver cancer Paternal Grandfather   . Diabetes Maternal Grandmother   . Kidney failure Maternal Grandmother    History  Substance Use Topics  . Smoking status: Never Smoker   . Smokeless tobacco: Never Used  . Alcohol Use: Yes     Comment: occ   OB History    Gravida Para Term Preterm AB TAB SAB Ectopic Multiple Living   2 1        1       Obstetric Comments   1st Menstrual Cycle:  12 1st Pregnancy: 20     Review of Systems  Constitutional: Positive for fever, appetite change and fatigue.  HENT: Positive for sore throat. Negative for congestion, rhinorrhea and trouble swallowing.   Respiratory: Positive for cough. Negative for shortness of breath.   Gastrointestinal: Positive for nausea. Negative for vomiting and diarrhea.  Musculoskeletal: Negative.   Neurological: Positive for headaches.    Allergies  Morphine and related  Home Medications   Prior to Admission medications   Medication Sig Start Date End  Date Taking? Authorizing Provider  cetirizine (ZYRTEC) 10 MG tablet Take 10 mg by mouth daily.    Yes Historical Provider, MD  ipratropium (ATROVENT) 0.06 % nasal spray Place 2 sprays into both nostrils 4 (four) times daily. 08/15/14  Yes Rodolph BongEvan S Corey, MD  omeprazole (PRILOSEC) 20 MG capsule Take 20 mg by mouth daily.   Yes Historical Provider, MD  oxyCODONE (ROXICODONE) 15 MG immediate release tablet Take 15 mg by mouth every 4 (four) hours as needed for pain.   Yes Historical Provider, MD  PARoxetine (PAXIL) 30 MG tablet Take 30 mg by mouth daily as needed (anxiety).   Yes Historical Provider, MD  amoxicillin (AMOXIL) 250 MG/5ML suspension Take 10 mLs (500 mg total) by mouth 2 (two) times daily. 09/08/14   Charm RingsErin J Cataleia Gade, MD  diphenhydrAMINE (BENADRYL) 25 mg capsule Take 1 capsule (25 mg total) by mouth every 6 (six) hours as needed for itching. 07/04/12 04/13/14  Johnsie Kindredarmen L Chatten, NP  HYDROcodone-homatropine (HYCODAN) 5-1.5 MG/5ML syrup Take 5 mLs by mouth every 6 (six) hours as needed for cough. 09/08/14   Charm RingsErin J Eli Adami, MD  predniSONE (DELTASONE) 10 MG tablet Take 3 tablets (30 mg total) by mouth daily. 08/15/14   Rodolph BongEvan S Corey, MD  traMADol (ULTRAM) 50 MG tablet Take 1 tablet (50 mg total) by mouth at bedtime as needed (cough). 08/15/14   Rodolph BongEvan S Corey,  MD   BP 145/95 mmHg  Pulse 120  Temp(Src) 101.4 F (38.6 C) (Oral)  Resp 18  SpO2 96%  LMP 09/08/2014 Physical Exam  Constitutional: She is oriented to person, place, and time. She appears well-developed and well-nourished.  Appears tired  HENT:  Head: Normocephalic and atraumatic.  Right Ear: External ear normal.  Left Ear: External ear normal.  Mouth/Throat: Mucous membranes are not dry. Oropharyngeal exudate and posterior oropharyngeal edema present. No tonsillar abscesses.  Eyes: Conjunctivae are normal.  Neck: Neck supple.  Cardiovascular: Normal rate, regular rhythm and normal heart sounds.   No murmur heard. Pulmonary/Chest: Effort  normal and breath sounds normal. No respiratory distress. She has no rales.  Neurological: She is alert and oriented to person, place, and time.    ED Course  Procedures (including critical care time) Labs Review Labs Reviewed  POCT RAPID STREP A ([facility] ONLY) - Abnormal; Notable for the following:    Streptococcus, Group A Screen (Direct) POSITIVE (*)    All other components within normal limits    Imaging Review No results found.   MDM   1. Strep pharyngitis    Rapid strep is positive. Amoxicillin for 10 days. Recommended Tylenol or Motrin for fever and headache. Prescription for Hycodan cough syrup provided. Discussed not driving while on this medicine. Continue to use Chloraseptic Spray as needed. Reviewed reasons to return as in after visit.    Charm RingsErin J Luisangel Wainright, MD 09/08/14 [phone]

## 2014-09-08 NOTE — ED Notes (Signed)
Pt states that she took 800 mg of ibuprofen at 1 p.m.  Pt declined gram of tylenol.  Mw,cma

## 2014-10-21 ENCOUNTER — Encounter (HOSPITAL_COMMUNITY): Payer: Self-pay | Admitting: Emergency Medicine

## 2014-10-21 ENCOUNTER — Emergency Department (HOSPITAL_COMMUNITY)
Admission: EM | Admit: 2014-10-21 | Discharge: 2014-10-21 | Disposition: A | Discharge status: Home / Self Care | Payer: Medicaid Other | Source: Home / Self Care | Attending: Family Medicine | Admitting: Family Medicine

## 2014-10-21 DIAGNOSIS — J069 Acute upper respiratory infection, unspecified: Secondary | ICD-10-CM

## 2014-10-21 LAB — POCT RAPID STREP A: Streptococcus, Group A Screen (Direct): NEGATIVE

## 2014-10-21 MED ORDER — DEXTROMETHORPHAN POLISTIREX 30 MG/5ML PO LQCR: 60.0000 mg | Freq: Two times a day (BID) | ORAL | Status: DC

## 2014-10-21 MED ORDER — IPRATROPIUM BROMIDE 0.06 % NA SOLN: 2.0000 | Freq: Four times a day (QID) | NASAL | Status: DC

## 2014-10-21 NOTE — Discharge Instructions (Signed)
Drink plenty of fluids as discussed, use medicine as prescribed, and mucinex or delsym for cough. Return or see your doctor if further problems °

## 2014-10-21 NOTE — ED Provider Notes (Signed)
CSN: [id number]     Arrival date & time 10/21/14  1950 History   First MD Initiated Contact with Patient 10/21/14 2025     Chief Complaint  Patient presents with  . URI   (Consider location/radiation/quality/duration/timing/severity/associated sxs/prior Treatment) Patient is a 28 y.o. female presenting with URI. The history is provided by the patient.  URI Presenting symptoms: congestion, cough, rhinorrhea and sore throat   Presenting symptoms: no fever   Severity:  Mild Onset quality:  Gradual Duration:  3 days Progression:  Unchanged Chronicity:  New Ineffective treatments:  OTC medications Associated symptoms: no wheezing     Past Medical History  Diagnosis Date  . GERD (gastroesophageal reflux disease)   . Foot pain   . Gastritis [id number]  . Seasonal allergies    Past Surgical History  Procedure Laterality Date  . Foot surgery    . Wrist surgery    . Breast biopsy Right 05/03/13   Family History  Problem Relation Age of Onset  . Hypertension Father   . Diabetes Father   . Liver cancer Paternal Grandfather   . Diabetes Maternal Grandmother   . Kidney failure Maternal Grandmother    History  Substance Use Topics  . Smoking status: Never Smoker   . Smokeless tobacco: Never Used  . Alcohol Use: Yes     Comment: occ   OB History    Gravida Para Term Preterm AB TAB SAB Ectopic Multiple Living   2 1        1       Obstetric Comments   1st Menstrual Cycle:  12 1st Pregnancy: 20     Review of Systems  Constitutional: Negative.  Negative for fever.  HENT: Positive for congestion, postnasal drip, rhinorrhea and sore throat.   Respiratory: Positive for cough. Negative for shortness of breath and wheezing.     Allergies  Morphine and related  Home Medications   Prior to Admission medications   Medication Sig Start Date End Date Taking? Authorizing Provider  cetirizine (ZYRTEC) 10 MG tablet Take 10 mg by mouth daily.    Yes Historical Provider, MD   omeprazole (PRILOSEC) 20 MG capsule Take 20 mg by mouth daily.   Yes Historical Provider, MD  oxyCODONE (ROXICODONE) 15 MG immediate release tablet Take 15 mg by mouth every 4 (four) hours as needed for pain.   Yes Historical Provider, MD  amoxicillin (AMOXIL) 250 MG/5ML suspension Take 10 mLs (500 mg total) by mouth 2 (two) times daily. 09/08/14   Charm RingsErin J Honig, MD  dextromethorphan (DELSYM) 30 MG/5ML liquid Take 10 mLs (60 mg total) by mouth 2 (two) times daily. 10/21/14   Linna HoffJames D Giovana Faciane, MD  diphenhydrAMINE (BENADRYL) 25 mg capsule Take 1 capsule (25 mg total) by mouth every 6 (six) hours as needed for itching. 07/04/12 04/13/14  Johnsie Kindredarmen L Chatten, NP  HYDROcodone-homatropine (HYCODAN) 5-1.5 MG/5ML syrup Take 5 mLs by mouth every 6 (six) hours as needed for cough. 09/08/14   Charm RingsErin J Honig, MD  ipratropium (ATROVENT) 0.06 % nasal spray Place 2 sprays into both nostrils 4 (four) times daily. 10/21/14   Linna HoffJames D Delvina Mizzell, MD  PARoxetine (PAXIL) 30 MG tablet Take 30 mg by mouth daily as needed (anxiety).    Historical Provider, MD  predniSONE (DELTASONE) 10 MG tablet Take 3 tablets (30 mg total) by mouth daily. 08/15/14   Rodolph BongEvan S Corey, MD  traMADol (ULTRAM) 50 MG tablet Take 1 tablet (50 mg total) by mouth at bedtime as needed (  cough). 08/15/14   Rodolph BongEvan S Corey, MD   BP 144/94 mmHg  Pulse 82  Temp(Src) 98.5 F (36.9 C) (Oral)  Resp 18  SpO2 98%  LMP 10/02/2014 Physical Exam  Constitutional: She is oriented to person, place, and time. She appears well-developed and well-nourished. No distress.  HENT:  Head: Normocephalic.  Right Ear: External ear normal.  Left Ear: External ear normal.  Mouth/Throat: Oropharynx is clear and moist.  Eyes: Conjunctivae are normal. Pupils are equal, round, and reactive to light.  Neck: Normal range of motion. Neck supple.  Cardiovascular: Regular rhythm, normal heart sounds and intact distal pulses.   Pulmonary/Chest: Effort normal and breath sounds normal.   Lymphadenopathy:    She has no cervical adenopathy.  Neurological: She is alert and oriented to person, place, and time.  Skin: Skin is warm and dry.  Nursing note and vitals reviewed.   ED Course  Procedures (including critical care time) Labs Review Labs Reviewed  CULTURE, GROUP A STREP  POCT RAPID STREP A ([facility] ONLY)    Imaging Review No results found.   MDM   1. URI (upper respiratory infection)        Linna HoffJames D Adriane Guglielmo, MD 10/22/14 [phone]

## 2014-10-21 NOTE — ED Notes (Signed)
C/o cold sx onset ST, couhg, congestion onset 3 days Taking mucinex OTC w/no relief Denies fevers, chills Alert, no signs of acute distress.

## 2014-10-24 LAB — CULTURE, GROUP A STREP

## 2015-02-12 ENCOUNTER — Emergency Department (HOSPITAL_COMMUNITY)
Admission: EM | Admit: 2015-02-12 | Discharge: 2015-02-12 | Disposition: A | Discharge status: Home / Self Care | Payer: Medicaid Other | Attending: Emergency Medicine | Admitting: Emergency Medicine

## 2015-02-12 ENCOUNTER — Encounter (HOSPITAL_COMMUNITY): Payer: Self-pay | Admitting: Family Medicine

## 2015-02-12 ENCOUNTER — Emergency Department (HOSPITAL_COMMUNITY): Payer: Medicaid Other

## 2015-02-12 DIAGNOSIS — Z8739 Personal history of other diseases of the musculoskeletal system and connective tissue: Secondary | ICD-10-CM | POA: Diagnosis not present

## 2015-02-12 DIAGNOSIS — K219 Gastro-esophageal reflux disease without esophagitis: Secondary | ICD-10-CM | POA: Diagnosis not present

## 2015-02-12 DIAGNOSIS — Z79899 Other long term (current) drug therapy: Secondary | ICD-10-CM | POA: Diagnosis not present

## 2015-02-12 DIAGNOSIS — Z8709 Personal history of other diseases of the respiratory system: Secondary | ICD-10-CM | POA: Insufficient documentation

## 2015-02-12 DIAGNOSIS — M546 Pain in thoracic spine: Secondary | ICD-10-CM | POA: Diagnosis present

## 2015-02-12 DIAGNOSIS — R52 Pain, unspecified: Secondary | ICD-10-CM

## 2015-02-12 MED ORDER — IBUPROFEN 600 MG PO TABS: 600.0000 mg | ORAL_TABLET | Freq: Four times a day (QID) | ORAL | Status: DC | PRN

## 2015-02-12 MED ORDER — DIAZEPAM 5 MG PO TABS
5.0000 mg | ORAL_TABLET | Freq: Once | ORAL | Status: AC
  Administered 2015-02-12: 5 mg via ORAL
  Filled 2015-02-12: qty 1

## 2015-02-12 MED ORDER — KETOROLAC TROMETHAMINE 60 MG/2ML IM SOLN
60.0000 mg | Freq: Once | INTRAMUSCULAR | Status: AC
  Administered 2015-02-12: 60 mg via INTRAMUSCULAR
  Filled 2015-02-12: qty 2

## 2015-02-12 NOTE — ED Provider Notes (Signed)
CSN: [id number]     Arrival date & time 02/12/15  4098 History   First MD Initiated Contact with Patient 02/12/15 [phone]     Chief Complaint  Patient presents with  . Back Pain     (Consider location/radiation/quality/duration/timing/severity/associated sxs/prior Treatment) HPI [name] is a 29 y.o. female who comes in complaining of mid back pain. Patient states on Sunday she was jumping on the trampoline when her friend "double jumped me and that caused me to jar my back". Denies any overt trauma or falling and landing on anything. She now reports diffuse aching pain that "wraps around my whole body". Pain 8/10.  She reports having taken a 15 mg oxycodone but this did not relieve her discomfort. She denies any numbness or weakness, abdominal pain, nausea vomiting, pelvic pain, vaginal bleeding or discharge, dysuria, hematuria.  Past Medical History  Diagnosis Date  . GERD (gastroesophageal reflux disease)   . Foot pain   . Gastritis [id number]  . Seasonal allergies    Past Surgical History  Procedure Laterality Date  . Foot surgery    . Wrist surgery    . Breast biopsy Right 05/03/13   Family History  Problem Relation Age of Onset  . Hypertension Father   . Diabetes Father   . Liver cancer Paternal Grandfather   . Diabetes Maternal Grandmother   . Kidney failure Maternal Grandmother    History  Substance Use Topics  . Smoking status: Never Smoker   . Smokeless tobacco: Never Used  . Alcohol Use: Yes     Comment: occ   OB History    Gravida Para Term Preterm AB TAB SAB Ectopic Multiple Living   Obstetric Comments   1st Menstrual Cycle:  12 1st Pregnancy: 20     Review of Systems A 10 point review of systems was completed and was negative except for pertinent positives and negatives as mentioned in the history of present illness     Allergies  Morphine and related  Home Medications   Prior to Admission medications   Medication Sig  Start Date End Date Taking? Authorizing Provider  fexofenadine (ALLEGRA) 180 MG tablet Take 180 mg by mouth daily.   Yes Historical Provider, MD  omeprazole (PRILOSEC) 20 MG capsule Take 20 mg by mouth daily.   Yes Historical Provider, MD  oxyCODONE (ROXICODONE) 15 MG immediate release tablet Take 15 mg by mouth every 4 (four) hours as needed for pain.   Yes Historical Provider, MD  phentermine (ADIPEX-P) 37.5 MG tablet Take 37.5 mg by mouth daily before breakfast.   Yes Historical Provider, MD  sertraline (ZOLOFT) 50 MG tablet Take 50 mg by mouth daily.   Yes Historical Provider, MD  amoxicillin (AMOXIL) 250 MG/5ML suspension Take 10 mLs (500 mg total) by mouth 2 (two) times daily. Patient not taking: Reported on 02/12/2015 09/08/14   Charm Rings, MD  dextromethorphan (DELSYM) 30 MG/5ML liquid Take 10 mLs (60 mg total) by mouth 2 (two) times daily. Patient not taking: Reported on 02/12/2015 10/21/14   Linna Hoff, MD  HYDROcodone-homatropine [facility]) 5-1.5 MG/5ML syrup Take 5 mLs by mouth every 6 (six) hours as needed for cough. Patient not taking: Reported on 02/12/2015 09/08/14   Charm Rings, MD  ibuprofen (ADVIL,MOTRIN) 600 MG tablet Take 1 tablet (600 mg total) by mouth every 6 (six) hours as needed. 02/12/15   Joycie Peek, PA-C  ipratropium (ATROVENT) 0.06 % nasal spray Place 2 sprays into both nostrils 4 (four) times daily. Patient not taking: Reported on 02/12/2015 10/21/14   Linna Hoff, MD  predniSONE (DELTASONE) 10 MG tablet Take 3 tablets (30 mg total) by mouth daily. Patient not taking: Reported on 02/12/2015 08/15/14   Rodolph Bong, MD  traMADol (ULTRAM) 50 MG tablet Take 1 tablet (50 mg total) by mouth at bedtime as needed (cough). Patient not taking: Reported on 02/12/2015 08/15/14   Rodolph Bong, MD   BP 137/81 mmHg  Pulse 94  Temp(Src) 97.9 F (36.6 C) (Oral)  Resp 22  Ht  (1.676 m)  Wt 228 lb (103.42 kg)  BMI 36.82 kg/m2  SpO2 99%  LMP 02/11/2015 Physical Exam   Constitutional: She is oriented to person, place, and time. She appears well-developed and well-nourished.  HENT:  Head: Normocephalic and atraumatic.  Mouth/Throat: Oropharynx is clear and moist.  Eyes: Conjunctivae are normal. Pupils are equal, round, and reactive to light. Right eye exhibits no discharge. Left eye exhibits no discharge. No scleral icterus.  Neck: Neck supple.  Cardiovascular: Normal rate, regular rhythm and normal heart sounds.   Pulmonary/Chest: Effort normal and breath sounds normal. No respiratory distress. She has no wheezes. She has no rales. She exhibits no tenderness.  Abdominal: Soft. There is no tenderness.  Musculoskeletal: She exhibits no tenderness.  Tenderness to palpation in mid thoracic paraspinal muscles, specifically rhomboids and latissimus dorsi. No overt bony tenderness. No spasm noted. FROM of spine and upper extremities bil.  Neurological: She is alert and oriented to person, place, and time.  Cranial Nerves II-XII grossly intact. Moves all extremities without ataxia. No focal deficits.  Skin: Skin is warm and dry. No rash noted.  Psychiatric: She has a normal mood and affect.  Nursing note and vitals reviewed.   ED Course  Procedures (including critical care time) Labs Review Labs Reviewed - No data to display  Imaging Review Dg Thoracic Spine 2 View  02/12/2015   CLINICAL DATA:  Right-sided upper back pain.  Injury Sunday.  EXAM: THORACIC SPINE - 2 VIEW  COMPARISON:  Chest radiograph 04/13/2014.  FINDINGS: There is a mild dextroconvex mid thoracic curvature. This is accentuated when compared to the prior chest radiograph. Vertebral body height is preserved. No compression fracture is present. Intervertebral disc spaces appear preserved. Paraspinal lines appear normal.  IMPRESSION: No acute osseous abnormality. Mild dextroconvex curvature which may be positional or secondary to spasm.   Electronically Signed   By: Andreas Newport M.D.   On:  02/12/2015 10:45     EKG Interpretation None     Meds given in ED:  Medications  ketorolac (TORADOL) injection 60 mg (60 mg Intramuscular Given 02/12/15 0951)  diazepam (VALIUM) tablet 5 mg (5 mg Oral Given 02/12/15 0951)    Discharge Medication List as of 02/12/2015 10:58 AM    START taking these medications   Details  ibuprofen (ADVIL,MOTRIN) 600 MG tablet Take 1 tablet (600 mg total) by mouth every 6 (six) hours as needed., Starting 02/12/2015, Until Discontinued, Print       Filed Vitals:   02/12/15 0857 02/12/15 0915 02/12/15 0945 02/12/15 1015  BP: 137/95 128/84 129/84 137/81  Pulse: 88 86 91 94  Temp: 97.9 F (36.6 C)     TempSrc: Oral     Resp: Height:  (1.676 m)     Weight: 228 lb (103.42 kg)  SpO2: 100% 100% 100% 99%     MDM  Vitals stable - WNL -afebrile Pt resting comfortably in ED. discomfort improving in ED. PE--benign abdominal exam. Thoracic back discomfort, No midline bony tenderness. Neurovascularly intact. Imaging--x-ray of thoracic spine negative for acute fracture or dislocation. There is dextroconvex curvature, potentially due to spasm.  DDX--patient with likely musculoskeletal strain. No evidence of other acute or emergent pathology at this time. No pathologic back pain red flags. We'll discharge with anti-inflammatories, instructions for further symptomatic relief with heat therapy and stretching exercises. Discussed follow-up with primary care for further evaluation and management of symptoms.  I discussed all relevant lab findings and imaging results with pt and they verbalized understanding. Discussed f/u with PCP within 48 hrs and return precautions, pt very amenable to plan.  Final diagnoses:  Bilateral thoracic back pain        Joycie PeekBenjamin Cairo Agostinelli, PA-C 02/13/15 0825  Rolland PorterMark James, MD 02/14/15 [phone]

## 2015-02-12 NOTE — ED Notes (Addendum)
Pt sts upper back pain since Sunday after jumping on the trampoline. sts pain radiates into abdomen. sts some nausea

## 2015-02-12 NOTE — Discharge Instructions (Signed)
Please take your pain medicines as directed. You may follow-up with her primary care for further evaluation and management of your symptoms. Return to ED for new or worsening symptoms. Heat Therapy Heat therapy can help ease sore, stiff, injured, and tight muscles and joints. Heat relaxes your muscles, which may help ease your pain.  RISKS AND COMPLICATIONS If you have any of the following conditions, do not use heat therapy unless your health care provider has approved:  Poor circulation.  Healing wounds or scarred skin in the area being treated.  Diabetes, heart disease, or high blood pressure.  Not being able to feel (numbness) the area being treated.  Unusual swelling of the area being treated.  Active infections.  Blood clots.  Cancer.  Inability to communicate pain. This may include young children and people who have problems with their brain function (dementia).  Pregnancy. Heat therapy should only be used on old, pre-existing, or long-lasting (chronic) injuries. Do not use heat therapy on new injuries unless directed by your health care provider. HOW TO USE HEAT THERAPY There are several different kinds of heat therapy, including:  Moist heat pack.  Warm water bath.  Hot water bottle.  Electric heating pad.  Heated gel pack.  Heated wrap.  Electric heating pad. Use the heat therapy method suggested by your health care provider. Follow your health care provider's instructions on when and how to use heat therapy. GENERAL HEAT THERAPY RECOMMENDATIONS  Do not sleep while using heat therapy. Only use heat therapy while you are awake.  Your skin may turn pink while using heat therapy. Do not use heat therapy if your skin turns red.  Do not use heat therapy if you have new pain.  High heat or long exposure to heat can cause burns. Be careful when using heat therapy to avoid burning your skin.  Do not use heat therapy on areas of your skin that are already  irritated, such as with a rash or sunburn. SEEK MEDICAL CARE IF:  You have blisters, redness, swelling, or numbness.  You have new pain.  Your pain is worse. MAKE SURE YOU:  Understand these instructions.  Will watch your condition.  Will get help right away if you are not doing well or get worse. Document Released: 01/10/2012 Document Revised: 03/04/2014 Document Reviewed: 12/11/2013 [facility] Patient Information 2015 [place], [place]. This information is not intended to replace advice given to you by your health care provider. Make sure you discuss any questions you have with your health care provider.

## 2015-02-12 NOTE — ED Notes (Signed)
PA at bedside.

## 2015-02-26 ENCOUNTER — Emergency Department
Admit: 2015-02-26 | Disposition: A | Discharge status: Home / Self Care | Payer: Self-pay | Admitting: Emergency Medicine

## 2015-02-27 LAB — COMPREHENSIVE METABOLIC PANEL
Albumin: 4 g/dL
Alkaline Phosphatase: 80 U/L
Anion Gap: 6 — ABNORMAL LOW (ref 7–16)
BUN: 10 mg/dL
Bilirubin,Total: 0.4 mg/dL
Calcium, Total: 9 mg/dL
Chloride: 107 mmol/L
Co2: 26 mmol/L
Creatinine: 0.73 mg/dL
EGFR (African American): >60
EGFR (Non-African Amer.): >60
Glucose: 88 mg/dL
Potassium: 4.1 mmol/L
SGOT(AST): 21 U/L
SGPT (ALT): 25 U/L
Sodium: 139 mmol/L
Total Protein: 7.3 g/dL

## 2015-02-27 LAB — CBC WITH DIFFERENTIAL/PLATELET
Basophil #: 0.3 10*3/uL — ABNORMAL HIGH (ref 0.0–0.1)
Basophil %: 2.6 %
Eosinophil #: 0.2 10*3/uL (ref 0.0–0.7)
Eosinophil %: 2.4 %
HCT: 38.7 % (ref 35.0–47.0)
HGB: 13.1 g/dL (ref 12.0–16.0)
Lymphocyte #: 1.9 10*3/uL (ref 1.0–3.6)
Lymphocyte %: 19.2 %
MCH: 27.5 pg (ref 26.0–34.0)
MCHC: 33.7 g/dL (ref 32.0–36.0)
MCV: 82 fL (ref 80–100)
Monocyte #: 0.5 x10 3/mm (ref 0.2–0.9)
Monocyte %: 5 %
Neutrophil #: 6.9 10*3/uL — ABNORMAL HIGH (ref 1.4–6.5)
Neutrophil %: 70.8 %
Platelet: 214 10*3/uL (ref 150–440)
RBC: 4.75 10*6/uL (ref 3.80–5.20)
RDW: 14.7 % — ABNORMAL HIGH (ref 11.5–14.5)
WBC: 9.8 10*3/uL (ref 3.6–11.0)

## 2015-02-27 LAB — URINALYSIS, COMPLETE
Bacteria: NONE SEEN
Bilirubin,UR: NEGATIVE
Blood: NEGATIVE
Glucose,UR: NEGATIVE mg/dL (ref 0–75)
Ketone: NEGATIVE
Leukocyte Esterase: NEGATIVE
Nitrite: NEGATIVE
Ph: 7 (ref 4.5–8.0)
Protein: NEGATIVE
Specific Gravity: 1.011 (ref 1.003–1.030)
Squamous Epithelial: NONE SEEN

## 2015-02-27 LAB — LIPASE, BLOOD: Lipase: 31 U/L

## 2015-04-22 ENCOUNTER — Encounter (HOSPITAL_COMMUNITY): Payer: Self-pay | Admitting: Emergency Medicine

## 2015-04-22 ENCOUNTER — Emergency Department (HOSPITAL_COMMUNITY)
Admission: EM | Admit: 2015-04-22 | Discharge: 2015-04-22 | Disposition: A | Discharge status: Home / Self Care | Payer: Medicaid Other | Attending: Emergency Medicine | Admitting: Emergency Medicine

## 2015-04-22 DIAGNOSIS — Z79899 Other long term (current) drug therapy: Secondary | ICD-10-CM | POA: Insufficient documentation

## 2015-04-22 DIAGNOSIS — Z8719 Personal history of other diseases of the digestive system: Secondary | ICD-10-CM | POA: Diagnosis not present

## 2015-04-22 DIAGNOSIS — K219 Gastro-esophageal reflux disease without esophagitis: Secondary | ICD-10-CM | POA: Insufficient documentation

## 2015-04-22 DIAGNOSIS — L739 Follicular disorder, unspecified: Secondary | ICD-10-CM | POA: Diagnosis not present

## 2015-04-22 DIAGNOSIS — M79605 Pain in left leg: Secondary | ICD-10-CM | POA: Diagnosis present

## 2015-04-22 MED ORDER — SULFAMETHOXAZOLE-TRIMETHOPRIM 800-160 MG PO TABS: 1.0000 | ORAL_TABLET | Freq: Two times a day (BID) | ORAL | Status: DC

## 2015-04-22 MED ORDER — CEPHALEXIN 500 MG PO CAPS: 500.0000 mg | ORAL_CAPSULE | Freq: Four times a day (QID) | ORAL | Status: DC

## 2015-04-22 NOTE — ED Notes (Signed)
The patent said her legs have "cysts" on them and they are sore.  She has not been diagnosed with "cysts" but that is what she thinks they are.  She rates her pain 8/10.

## 2015-04-22 NOTE — Discharge Instructions (Signed)
Keflex and Bactrim as prescribed.  Return to the emergency department if symptoms significantly worsen or change.   Folliculitis  Folliculitis is redness, soreness, and swelling (inflammation) of the hair follicles. This condition can occur anywhere on the body. People with weakened immune systems, diabetes, or obesity have a greater risk of getting folliculitis. CAUSES  Bacterial infection. This is the most common cause.  Fungal infection.  Viral infection.  Contact with certain chemicals, especially oils and tars. Long-term folliculitis can result from bacteria that live in the nostrils. The bacteria may trigger multiple outbreaks of folliculitis over time. SYMPTOMS Folliculitis most commonly occurs on the scalp, thighs, legs, back, buttocks, and areas where hair is shaved frequently. An early sign of folliculitis is a small, white or yellow, pus-filled, itchy lesion (pustule). These lesions appear on a red, inflamed follicle. They are usually less than 0.2 inches (5 mm) wide. When there is an infection of the follicle that goes deeper, it becomes a boil or furuncle. A group of closely packed boils creates a larger lesion (carbuncle). Carbuncles tend to occur in hairy, sweaty areas of the body. DIAGNOSIS  Your caregiver can usually tell what is wrong by doing a physical exam. A sample may be taken from one of the lesions and tested in a lab. This can help determine what is causing your folliculitis. TREATMENT  Treatment may include:  Applying warm compresses to the affected areas.  Taking antibiotic medicines orally or applying them to the skin.  Draining the lesions if they contain a large amount of pus or fluid.  Laser hair removal for cases of long-lasting folliculitis. This helps to prevent regrowth of the hair. HOME CARE INSTRUCTIONS  Apply warm compresses to the affected areas as directed by your caregiver.  If antibiotics are prescribed, take them as directed. Finish them  even if you start to feel better.  You may take over-the-counter medicines to relieve itching.  Do not shave irritated skin.  Follow up with your caregiver as directed. SEEK IMMEDIATE MEDICAL CARE IF:   You have increasing redness, swelling, or pain in the affected area.  You have a fever. MAKE SURE YOU:  Understand these instructions.  Will watch your condition.  Will get help right away if you are not doing well or get worse. Document Released: 12/27/2001 Document Revised: 04/18/2012 Document Reviewed: 01/18/2012 [facility] Patient Information 2015 [place], [place]. This information is not intended to replace advice given to you by your health care provider. Make sure you discuss any questions you have with your health care provider.

## 2015-04-22 NOTE — ED Notes (Signed)
Patient is alert and orientedx4.  Patient was explained discharge instructions and they understood them with no questions.   

## 2015-04-22 NOTE — ED Provider Notes (Signed)
CSN: [id number]     Arrival date & time 04/22/15  0457 History   First MD Initiated Contact with Patient 04/22/15 0541     Chief Complaint  Patient presents with  . Leg Pain    The patent said her legs have "cysts" on them and they are sore.  She has not been diagnosed with "cysts" but that is what she thinks they are.     (Consider location/radiation/quality/duration/timing/severity/associated sxs/prior Treatment) HPI Comments: Patient is a 29 year old female who presents with complaints of red, swollen areas to both legs that have occurred in the absence of any injury or new exposures. She denies any fevers or chills. She denies that these itch. She states that they are tender and sore to the touch.  Patient is a 29 y.o. female presenting with leg pain. The history is provided by the patient.  Leg Pain Location:  Leg Time since incident:  5 days Injury: no   Leg location:  L leg and R leg Pain details:    Quality:  Throbbing   Radiates to:  Does not radiate   Severity:  Moderate   Timing:  Constant   Progression:  Worsening Chronicity:  New   Past Medical History  Diagnosis Date  . GERD (gastroesophageal reflux disease)   . Foot pain   . Gastritis [id number]  . Seasonal allergies    Past Surgical History  Procedure Laterality Date  . Foot surgery    . Wrist surgery    . Breast biopsy Right 05/03/13   Family History  Problem Relation Age of Onset  . Hypertension Father   . Diabetes Father   . Liver cancer Paternal Grandfather   . Diabetes Maternal Grandmother   . Kidney failure Maternal Grandmother    History  Substance Use Topics  . Smoking status: Never Smoker   . Smokeless tobacco: Never Used  . Alcohol Use: Yes     Comment: occ   OB History    Gravida Para Term Preterm AB TAB SAB Ectopic Multiple Living   2 1        1       Obstetric Comments   1st Menstrual Cycle:  12 1st Pregnancy: 20     Review of Systems  All other systems reviewed and are  negative.     Allergies  Morphine and related  Home Medications   Prior to Admission medications   Medication Sig Start Date End Date Taking? Authorizing Provider  cyclobenzaprine (FLEXERIL) 10 MG tablet Take 10 mg by mouth 2 (two) times daily as needed for muscle spasms.   Yes Historical Provider, MD  fexofenadine (ALLEGRA) 180 MG tablet Take 180 mg by mouth daily.   Yes Historical Provider, MD  omeprazole (PRILOSEC) 20 MG capsule Take 20 mg by mouth daily.   Yes Historical Provider, MD  oxyCODONE (ROXICODONE) 15 MG immediate release tablet Take 15 mg by mouth every 4 (four) hours as needed for pain.   Yes Historical Provider, MD  sertraline (ZOLOFT) 50 MG tablet Take 50 mg by mouth daily.   Yes Historical Provider, MD  amoxicillin (AMOXIL) 250 MG/5ML suspension Take 10 mLs (500 mg total) by mouth 2 (two) times daily. Patient not taking: Reported on 02/12/2015 09/08/14   Charm Rings, MD  dextromethorphan (DELSYM) 30 MG/5ML liquid Take 10 mLs (60 mg total) by mouth 2 (two) times daily. Patient not taking: Reported on 02/12/2015 10/21/14   Linna Hoff, MD  HYDROcodone-homatropine [facility]) 5-1.5 MG/5ML syrup Take  5 mLs by mouth every 6 (six) hours as needed for cough. Patient not taking: Reported on 02/12/2015 09/08/14   Charm Rings, MD  ibuprofen (ADVIL,MOTRIN) 600 MG tablet Take 1 tablet (600 mg total) by mouth every 6 (six) hours as needed. Patient not taking: Reported on 04/22/2015 02/12/15   Joycie Peek, PA-C  ipratropium (ATROVENT) 0.06 % nasal spray Place 2 sprays into both nostrils 4 (four) times daily. Patient not taking: Reported on 02/12/2015 10/21/14   Linna Hoff, MD  predniSONE (DELTASONE) 10 MG tablet Take 3 tablets (30 mg total) by mouth daily. Patient not taking: Reported on 02/12/2015 08/15/14   Rodolph Bong, MD  traMADol (ULTRAM) 50 MG tablet Take 1 tablet (50 mg total) by mouth at bedtime as needed (cough). Patient not taking: Reported on 02/12/2015 08/15/14   Rodolph Bong, MD   BP 161/89 mmHg  Pulse 98  Temp(Src) 99.6 F (37.6 C) (Oral)  Resp 16  SpO2 100%  LMP 03/23/2015 Physical Exam  Constitutional: She is oriented to person, place, and time. She appears well-developed and well-nourished. No distress.  HENT:  Head: Normocephalic and atraumatic.  Neck: Normal range of motion. Neck supple.  Musculoskeletal: Normal range of motion.  Neurological: She is alert and oriented to person, place, and time.  Skin: Skin is warm and dry. She is not diaphoretic.  Both lower extremities are noted to have round, erythematous areas to the medial aspect of both knees. There are also smaller areas to the thighs and lower leg. These are warm to the touch and tender.  Nursing note and vitals reviewed.   ED Course  Procedures (including critical care time) Labs Review Labs Reviewed - No data to display  Imaging Review No results found.   EKG Interpretation None      MDM   Final diagnoses:  None    This appears to be folliculitis. We'll treat with Keflex, Bactrim, and when necessary return.    Geoffery Lyons, MD 04/22/15 [phone]

## 2015-04-27 ENCOUNTER — Encounter (HOSPITAL_COMMUNITY): Payer: Self-pay | Admitting: Emergency Medicine

## 2015-04-27 ENCOUNTER — Emergency Department (HOSPITAL_COMMUNITY)
Admission: EM | Admit: 2015-04-27 | Discharge: 2015-04-27 | Disposition: A | Discharge status: Home / Self Care | Payer: Medicaid Other | Source: Home / Self Care | Attending: Emergency Medicine | Admitting: Emergency Medicine

## 2015-04-27 DIAGNOSIS — L52 Erythema nodosum: Secondary | ICD-10-CM

## 2015-04-27 MED ORDER — IBUPROFEN 800 MG PO TABS: 800.0000 mg | ORAL_TABLET | Freq: Three times a day (TID) | ORAL | Status: DC | PRN

## 2015-04-27 MED ORDER — PREDNISONE 50 MG PO TABS: ORAL_TABLET | ORAL | Status: DC

## 2015-04-27 NOTE — ED Provider Notes (Signed)
CSN: [id number]     Arrival date & time 04/27/15  1907 History   First MD Initiated Contact with Patient 04/27/15 1927     Chief Complaint  Patient presents with  . Cyst   (Consider location/radiation/quality/duration/timing/severity/associated sxs/prior Treatment) HPI  She is a 29 year old woman here for evaluation of bumps on her legs. She states these started 1-2 weeks ago.  She states they are tender red bumps on her legs and feet. She denies any associated fever or chills. No lymphadenopathy. No other symptoms. She went to the emergency room about 5 days ago and was prescribed Keflex and Bactrim for possible folliculitis. She states these medicines have not helped at all, and the nodules seem to be getting worse. She also reports some pain in her right ankle and left knee. She has some swelling of the right ankle.  No new medications.  Past Medical History  Diagnosis Date  . GERD (gastroesophageal reflux disease)   . Foot pain   . Gastritis [id number]  . Seasonal allergies    Past Surgical History  Procedure Laterality Date  . Foot surgery    . Wrist surgery    . Breast biopsy Right 05/03/13   Family History  Problem Relation Age of Onset  . Hypertension Father   . Diabetes Father   . Liver cancer Paternal Grandfather   . Diabetes Maternal Grandmother   . Kidney failure Maternal Grandmother    History  Substance Use Topics  . Smoking status: Never Smoker   . Smokeless tobacco: Never Used  . Alcohol Use: Yes     Comment: occ   OB History    Gravida Para Term Preterm AB TAB SAB Ectopic Multiple Living   2 1        1       Obstetric Comments   1st Menstrual Cycle:  12 1st Pregnancy: 20     Review of Systems As in history of present illness Allergies  Morphine and related  Home Medications   Prior to Admission medications   Medication Sig Start Date End Date Taking? Authorizing Provider  cyclobenzaprine (FLEXERIL) 10 MG tablet Take 10 mg by mouth 2 (two) times  daily as needed for muscle spasms.   Yes Historical Provider, MD  fexofenadine (ALLEGRA) 180 MG tablet Take 180 mg by mouth daily.   Yes Historical Provider, MD  omeprazole (PRILOSEC) 20 MG capsule Take 20 mg by mouth daily.   Yes Historical Provider, MD  oxyCODONE (ROXICODONE) 15 MG immediate release tablet Take 15 mg by mouth every 4 (four) hours as needed for pain.   Yes Historical Provider, MD  ibuprofen (ADVIL,MOTRIN) 800 MG tablet Take 1 tablet (800 mg total) by mouth every 8 (eight) hours as needed for moderate pain. 04/27/15   Charm Rings, MD  predniSONE (DELTASONE) 50 MG tablet Take 1 pill daily for 5 days. 04/27/15   Charm Rings, MD  sertraline (ZOLOFT) 50 MG tablet Take 50 mg by mouth daily.    Historical Provider, MD   BP 137/90 mmHg  Pulse 95  Temp(Src) 98.8 F (37.1 C) (Oral)  Resp 18  SpO2 100%  LMP 03/23/2015 Physical Exam  Constitutional: She is oriented to person, place, and time. She appears well-developed and well-nourished. No distress.  Cardiovascular: Normal rate.   Pulmonary/Chest: Effort normal.  Musculoskeletal:  Right ankle: There is some mild swelling. She is tender just distal to the medial and lateral malleoli. No joint instability.  Neurological: She is alert and  oriented to person, place, and time.  Skin:  8 tender erythematous nodules spread out over her lower extremities.    ED Course  Procedures (including critical care time) Labs Review Labs Reviewed - No data to display  Imaging Review No results found.   MDM   1. Erythema nodosum    Instructed her to stop the antibiotics. Treat with a five-day course of prednisone. Ibuprofen for pain. ASO brace for comfort. Follow-up with PCP in one week for recheck.    Charm Rings, MD 04/27/15 2013

## 2015-04-27 NOTE — ED Notes (Signed)
Pt reports multiple cyst on bilateral legs onset 6/16 Was seen at [facility] ER on 6/21 and dx w/Folliculitis; also seen by PCP Given Septra and Keflex w/no relief Alert, steady gait.. No signs of acute distress.

## 2015-04-27 NOTE — Discharge Instructions (Signed)
You likely has something called erythema nodosum. You can stop the antibiotics. This is caused by inflammation of the blood vessels. Take prednisone daily for 5 days. Use the ibuprofen 3 times a day as needed for pain. You should see improvement over the next 1-2 weeks. Please follow-up with your primary care doctor in 1 week for recheck.

## 2015-05-09 ENCOUNTER — Emergency Department (HOSPITAL_COMMUNITY): Payer: Medicaid Other

## 2015-05-09 ENCOUNTER — Encounter (HOSPITAL_COMMUNITY): Payer: Self-pay | Admitting: Emergency Medicine

## 2015-05-09 ENCOUNTER — Emergency Department (HOSPITAL_COMMUNITY)
Admission: EM | Admit: 2015-05-09 | Discharge: 2015-05-09 | Discharge status: Absent without leave | Payer: Medicaid Other | Source: Home / Self Care

## 2015-05-09 ENCOUNTER — Emergency Department (HOSPITAL_COMMUNITY)
Admission: EM | Admit: 2015-05-09 | Discharge: 2015-05-10 | Disposition: A | Discharge status: Home / Self Care | Payer: Medicaid Other | Attending: Emergency Medicine | Admitting: Emergency Medicine

## 2015-05-09 DIAGNOSIS — K219 Gastro-esophageal reflux disease without esophagitis: Secondary | ICD-10-CM | POA: Diagnosis not present

## 2015-05-09 DIAGNOSIS — M79605 Pain in left leg: Secondary | ICD-10-CM | POA: Diagnosis not present

## 2015-05-09 DIAGNOSIS — L52 Erythema nodosum: Secondary | ICD-10-CM | POA: Insufficient documentation

## 2015-05-09 DIAGNOSIS — Z3202 Encounter for pregnancy test, result negative: Secondary | ICD-10-CM | POA: Insufficient documentation

## 2015-05-09 DIAGNOSIS — R109 Unspecified abdominal pain: Secondary | ICD-10-CM | POA: Diagnosis present

## 2015-05-09 DIAGNOSIS — K2970 Gastritis, unspecified, without bleeding: Secondary | ICD-10-CM | POA: Insufficient documentation

## 2015-05-09 DIAGNOSIS — Z79899 Other long term (current) drug therapy: Secondary | ICD-10-CM | POA: Insufficient documentation

## 2015-05-09 DIAGNOSIS — M79604 Pain in right leg: Secondary | ICD-10-CM | POA: Diagnosis not present

## 2015-05-09 DIAGNOSIS — K5900 Constipation, unspecified: Secondary | ICD-10-CM | POA: Insufficient documentation

## 2015-05-09 LAB — URINALYSIS, ROUTINE W REFLEX MICROSCOPIC
Bilirubin Urine: NEGATIVE
Glucose, UA: NEGATIVE mg/dL
Hgb urine dipstick: NEGATIVE
Ketones, ur: NEGATIVE mg/dL
Leukocytes, UA: NEGATIVE
Nitrite: NEGATIVE
Protein, ur: NEGATIVE mg/dL
Specific Gravity, Urine: 1.023 (ref 1.005–1.030)
Urobilinogen, UA: 0.2 mg/dL (ref 0.0–1.0)
pH: 5.5 (ref 5.0–8.0)

## 2015-05-09 LAB — CBC WITH DIFFERENTIAL/PLATELET
Basophils Absolute: 0 10*3/uL (ref 0.0–0.1)
Basophils Relative: 0 % (ref 0–1)
Eosinophils Absolute: 0.3 10*3/uL (ref 0.0–0.7)
Eosinophils Relative: 3 % (ref 0–5)
HCT: 39.1 % (ref 36.0–46.0)
Hemoglobin: 12.6 g/dL (ref 12.0–15.0)
Lymphocytes Relative: 22 % (ref 12–46)
Lymphs Abs: 2.1 10*3/uL (ref 0.7–4.0)
MCH: 27.2 pg (ref 26.0–34.0)
MCHC: 32.2 g/dL (ref 30.0–36.0)
MCV: 84.4 fL (ref 78.0–100.0)
Monocytes Absolute: 0.5 10*3/uL (ref 0.1–1.0)
Monocytes Relative: 5 % (ref 3–12)
Neutro Abs: 6.9 10*3/uL (ref 1.7–7.7)
Neutrophils Relative %: 70 % (ref 43–77)
Platelets: 250 10*3/uL (ref 150–400)
RBC: 4.63 MIL/uL (ref 3.87–5.11)
RDW: 13.4 % (ref 11.5–15.5)
WBC: 9.8 10*3/uL (ref 4.0–10.5)

## 2015-05-09 LAB — COMPREHENSIVE METABOLIC PANEL
ALT: 21 U/L (ref 14–54)
AST: 27 U/L (ref 15–41)
Albumin: 3.7 g/dL (ref 3.5–5.0)
Alkaline Phosphatase: 68 U/L (ref 38–126)
Anion gap: 9 (ref 5–15)
BUN: 8 mg/dL (ref 6–20)
CO2: 25 mmol/L (ref 22–32)
Calcium: 9.1 mg/dL (ref 8.9–10.3)
Chloride: 104 mmol/L (ref 101–111)
Creatinine, Ser: 0.71 mg/dL (ref 0.44–1.00)
GFR calc Af Amer: >60 mL/min (ref >60)
GFR calc non Af Amer: >60 mL/min (ref >60)
Glucose, Bld: 82 mg/dL (ref 65–99)
Potassium: 3.7 mmol/L (ref 3.5–5.1)
Sodium: 138 mmol/L (ref 135–145)
Total Bilirubin: 0.8 mg/dL (ref 0.3–1.2)
Total Protein: 7.4 g/dL (ref 6.5–8.1)

## 2015-05-09 LAB — POC URINE PREG, ED: Preg Test, Ur: NEGATIVE

## 2015-05-09 LAB — SEDIMENTATION RATE: Sed Rate: 42 mm/hr — ABNORMAL HIGH (ref 0–22)

## 2015-05-09 MED ORDER — IBUPROFEN 600 MG PO TABS: 600.0000 mg | ORAL_TABLET | Freq: Four times a day (QID) | ORAL | Status: DC | PRN

## 2015-05-09 MED ORDER — DICYCLOMINE HCL 10 MG PO CAPS
10.0000 mg | ORAL_CAPSULE | Freq: Once | ORAL | Status: AC
  Administered 2015-05-09: 10 mg via ORAL
  Filled 2015-05-09: qty 1

## 2015-05-09 MED ORDER — ONDANSETRON 4 MG PO TBDP: 4.0000 mg | ORAL_TABLET | Freq: Three times a day (TID) | ORAL | Status: DC | PRN

## 2015-05-09 MED ORDER — KETOROLAC TROMETHAMINE 30 MG/ML IJ SOLN
30.0000 mg | Freq: Once | INTRAMUSCULAR | Status: AC
  Administered 2015-05-09: 30 mg via INTRAVENOUS
  Filled 2015-05-09: qty 1

## 2015-05-09 MED ORDER — LORAZEPAM 2 MG/ML IJ SOLN
1.0000 mg | Freq: Once | INTRAMUSCULAR | Status: AC
  Administered 2015-05-09: 1 mg via INTRAVENOUS
  Filled 2015-05-09: qty 1

## 2015-05-09 MED ORDER — DICYCLOMINE HCL 20 MG PO TABS: 20.0000 mg | ORAL_TABLET | Freq: Two times a day (BID) | ORAL | Status: DC

## 2015-05-09 MED ORDER — PREDNISONE 10 MG PO TABS: 10.0000 mg | ORAL_TABLET | Freq: Every day | ORAL | Status: DC

## 2015-05-09 NOTE — Discharge Instructions (Signed)
Please follow up with your primary care physician in 1-2 days. If you do not have one please call the [facility] number listed above. Please take medications as prescribed. Please read all discharge instructions and return precautions.   Erythema Nodosum Erythema nodosum is also called "painful red nodules on the legs". Symptoms can include sudden onset of fever, fatigue and joint pains. Red, deep, tender, raised, bruise-like bumps form on the shin, or sometimes on the arms or trunk. The skin over the bumps is usually shiny. The symptoms usually last 1-2 weeks. The symptoms usually improve once the cause is treated. This illness may be caused by strep or fungal infection, drug reaction, pregnancy, or other medical conditions. Treating the cause is important to early recovery. Erythema nodosum occurs at any age and in both sexes but more often in young adult women. Erythema nodosum is not a skin infection. It is a reaction to something internal. In most cases the illness and bumps go away with treatment. You should avoid any medicine that may have caused this reaction. Antihistamine drugs, anti-inflammatory drugs or cortisone medicine may be prescribed to relieve symptoms. SSKI drops ([place]) taken with juice at breakfast, lunch and dinner may have an anti-inflammatory effect and speed the healing. Bed rest and limiting vigorous exercise help to shorten the course of erythema nodosum. Elevation of the affected limb also helps in recovery. As the condition gets better, the bumps flatten and your skin may heal with temporary bruise marks. These dark marks will clear up in several months and they are a good sign that your skin is healing.  SEEK IMMEDIATE MEDICAL CARE IF:   Your condition worsens, or if you have more severe symptoms such a high fever, sore throat, or repeated vomiting. Document Released: 11/25/2004 Document Revised: 01/10/2012 Document Reviewed: 11/29/2008 [facility] Patient  Information 2015 [place], [place]. This information is not intended to replace advice given to you by your health care provider. Make sure you discuss any questions you have with your health care provider.  Abdominal Pain Many things can cause abdominal pain. Usually, abdominal pain is not caused by a disease and will improve without treatment. It can often be observed and treated at home. Your health care provider will do a physical exam and possibly order blood tests and X-rays to help determine the seriousness of your pain. However, in many cases, more time must pass before a clear cause of the pain can be found. Before that point, your health care provider may not know if you need more testing or further treatment. HOME CARE INSTRUCTIONS  Monitor your abdominal pain for any changes. The following actions may help to alleviate any discomfort you are experiencing:  Only take over-the-counter or prescription medicines as directed by your health care provider.  Do not take laxatives unless directed to do so by your health care provider.  Try a clear liquid diet (broth, tea, or water) as directed by your health care provider. Slowly move to a bland diet as tolerated. SEEK MEDICAL CARE IF:  You have unexplained abdominal pain.  You have abdominal pain associated with nausea or diarrhea.  You have pain when you urinate or have a bowel movement.  You experience abdominal pain that wakes you in the night.  You have abdominal pain that is worsened or improved by eating food.  You have abdominal pain that is worsened with eating fatty foods.  You have a fever. SEEK IMMEDIATE MEDICAL CARE IF:   Your  pain does not go away within 2 hours.  You keep throwing up (vomiting).  Your pain is felt only in portions of the abdomen, such as the right side or the left lower portion of the abdomen.  You pass bloody or black tarry stools. MAKE SURE YOU:  Understand these instructions.   Will watch  your condition.   Will get help right away if you are not doing well or get worse.  Document Released: 07/28/2005 Document Revised: 10/23/2013 Document Reviewed: 06/27/2013 [facility] Patient Information 2015 [place], [place]. This information is not intended to replace advice given to you by your health care provider. Make sure you discuss any questions you have with your health care provider.

## 2015-05-09 NOTE — ED Notes (Signed)
Pt sts lower abd cramping and leg pain with "cysts" on her legs; pt sts LMP was 1 week ago

## 2015-05-09 NOTE — ED Provider Notes (Signed)
CSN: [id number]     Arrival date & time 05/09/15  1704 History   First MD Initiated Contact with Patient 05/09/15 1853     Chief Complaint  Patient presents with  . Abdominal Pain  . Leg Pain     (Consider location/radiation/quality/duration/timing/severity/associated sxs/prior Treatment) HPI Comments: Patient is a 29 year old female past medical history significant for GERD, seasonal allergies presenting to the emergency department for evaluation of 2 complaints. Patient states she has had 3-4 days of generalized abdominal cramping pain with associated intermittent non-bloody diarrhea and constipation. She's not tried any medications for her symptoms. No modifying factors identified. She was on Keflex and Bactrim 2 weeks ago for possible folliculitis. Denies any fevers, nausea, vomiting, vaginal discharge or bleeding, urinary symptoms. 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 She states the prednisone was initially helping but the bumps have not entirely resolved. No familial hematologic disorders known.   Past Medical History  Diagnosis Date  . GERD (gastroesophageal reflux disease)   . Foot pain   . Gastritis [id number]  . Seasonal allergies    Past Surgical History  Procedure Laterality Date  . Foot surgery    . Wrist surgery    . Breast biopsy Right 05/03/13   Family History  Problem Relation Age of Onset  . Hypertension Father   . Diabetes Father   . Liver cancer Paternal Grandfather   . Diabetes Maternal Grandmother   . Kidney failure Maternal Grandmother    History  Substance Use Topics  . Smoking status: Never Smoker   . Smokeless tobacco: Never Used  . Alcohol Use: Yes     Comment: occ   OB History    Gravida Para Term Preterm AB TAB SAB Ectopic Multiple Living   Obstetric Comments   1st Menstrual Cycle:  12 1st Pregnancy: 20     Review of  Systems  Constitutional: Negative for fever.  Gastrointestinal: Positive for abdominal pain, diarrhea and constipation. Negative for nausea, vomiting, blood in stool, abdominal distention, anal bleeding and rectal pain.  All other systems reviewed and are negative.    Allergies  Morphine and related  Home Medications   Prior to Admission medications   Medication Sig Start Date End Date Taking? Authorizing Provider  cyclobenzaprine (FLEXERIL) 10 MG tablet Take 10 mg by mouth 2 (two) times daily as needed for muscle spasms.   Yes Historical Provider, MD  fexofenadine (ALLEGRA) 180 MG tablet Take 180 mg by mouth daily.   Yes Historical Provider, MD  ibuprofen (ADVIL,MOTRIN) 800 MG tablet Take 1 tablet (800 mg total) by mouth every 8 (eight) hours as needed for moderate pain. 04/27/15  Yes Charm Rings, MD  omeprazole (PRILOSEC) 20 MG capsule Take 20 mg by mouth daily.   Yes Historical Provider, MD  sertraline (ZOLOFT) 50 MG tablet Take 50 mg by mouth daily.   Yes Historical Provider, MD  dicyclomine (BENTYL) 20 MG tablet Take 1 tablet (20 mg total) by mouth 2 (two) times daily. 05/09/15   Kristina Mcnorton, PA-C  ibuprofen (ADVIL,MOTRIN) 600 MG tablet Take 1 tablet (600 mg total) by mouth every 6 (six) hours as needed. 05/09/15   Neda Willenbring, PA-C  ondansetron (ZOFRAN ODT) 4 MG disintegrating tablet Take 1 tablet (4 mg total) by mouth every 8 (eight) hours as needed for nausea  or vomiting. 05/09/15   Dakia Schifano, PA-C  predniSONE (DELTASONE) 10 MG tablet Take 1 tablet (10 mg total) by mouth daily. Take 60 mg PO daily for 2 days. Take 50 mg PO daily for two days. Take 40 mg by mouth daily for 2 days. Take 30 mg PO daily for two days. Take 20 mg PO daily for two days. Take 10 mg PO daily for two days. 05/09/15   Reyne Falconi, PA-C   BP 128/78 mmHg  Pulse 90  Temp(Src) 98.8 F (37.1 C) (Oral)  Resp 19  SpO2 100%  LMP 05/04/2015 Physical Exam  Constitutional: She is  oriented to person, place, and time. She appears well-developed and well-nourished. No distress.  HENT:  Head: Normocephalic and atraumatic.  Right Ear: External ear normal.  Left Ear: External ear normal.  Nose: Nose normal.  Mouth/Throat: Oropharynx is clear and moist.  Eyes: Conjunctivae are normal.  Neck: Normal range of motion. Neck supple.  No nuchal rigidity.   Cardiovascular: Normal rate, regular rhythm and normal heart sounds.   Pulmonary/Chest: Effort normal and breath sounds normal.  Abdominal: Soft. Bowel sounds are normal. She exhibits no distension. There is no tenderness. There is no rebound and no guarding.  Musculoskeletal: Normal range of motion.  Neurological: She is alert and oriented to person, place, and time.  Skin: Skin is warm and dry. She is not diaphoretic.  8 tender erythematous nodules spread out over her lower extremities  Psychiatric: She has a normal mood and affect.  Nursing note and vitals reviewed.   ED Course  Procedures (including critical care time) Medications  dicyclomine (BENTYL) capsule 10 mg (10 mg Oral Given 05/09/15 2149)  LORazepam (ATIVAN) injection 1 mg (1 mg Intravenous Given 05/09/15 2311)  ketorolac (TORADOL) 30 MG/ML injection 30 mg (30 mg Intravenous Given 05/09/15 2310)    Labs Review Labs Reviewed  SEDIMENTATION RATE - Abnormal; Notable for the following:    Sed Rate 42 (*)    All other components within normal limits  URINALYSIS, ROUTINE W REFLEX MICROSCOPIC (NOT AT [facility])  CBC WITH DIFFERENTIAL/PLATELET  COMPREHENSIVE METABOLIC PANEL  ANTISTREPTOLYSIN O TITER  POC URINE PREG, ED    Imaging Review Dg Chest 2 View  05/09/2015   CLINICAL DATA:  Left-sided chest pain for 3 weeks.  EXAM: CHEST  2 VIEW  COMPARISON:  April 13, 2014.  FINDINGS: The heart size and mediastinal contours are within normal limits. Both lungs are clear. No pneumothorax or pleural effusion is noted. The visualized skeletal structures are unremarkable.   IMPRESSION: No active cardiopulmonary disease.   Electronically Signed   By: Lupita RaiderJames  Green Jr, M.D.   On: 05/09/2015 20:31   Dg Abd 1 View  05/09/2015   CLINICAL DATA:  Abdominal pain, constipation and cramping for 4 days.  EXAM: ABDOMEN - 1 VIEW  COMPARISON:  12/09/2010 radiographs  FINDINGS: The bowel gas pattern is normal. No radio-opaque calculi or other significant radiographic abnormality are seen.  IMPRESSION: Negative.   Electronically Signed   By: Harmon PierJeffrey  Hu M.D.   On: 05/09/2015 21:16     EKG Interpretation None      MDM   Final diagnoses:  Abdominal cramping  Erythema nodosum   Filed Vitals:   05/10/15 0005  BP: 128/78  Pulse: 90  Temp: 98.8 F (37.1 C)  Resp: 19   Afebrile, NAD, non-toxic appearing, AAOx4.   I have reviewed nursing notes, vital signs, and all appropriate lab and imaging results if ordered  as above.   1) Abdominal pain: Patient is nontoxic, nonseptic appearing, in no apparent distress.  Patient's pain and other symptoms adequately managed in emergency department.  Labs, imaging and vitals reviewed.  Patient does not meet the SIRS or Sepsis criteria.  On repeat exam patient does not have a surgical abdomin and there are no peritoneal signs.  No indication of appendicitis, bowel obstruction, bowel perforation, cholecystitis, diverticulitis, PID or ectopic pregnancy.    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 No lesions noted on chest x-ray. I personally reviewed the imaging and agree with the radiologist. History symptoms consistent with strep pharyngitis, titer sent. Labs reviewed. Will place on Prednisone taper and start NSAIDs. Advised PCP f/u.  Patient discharged home with symptomatic treatment and given strict instructions for follow-up with their primary care physician.  I have also discussed reasons to return immediately to the ER.  Patient expresses understanding and agrees with plan.  Patient d/w with Dr. Micheline Maze,  agrees with plan.       Francee Piccolo, PA-C 05/10/15 0011  Toy Cookey, MD 05/10/15 [phone]

## 2015-05-11 LAB — ANTISTREPTOLYSIN O TITER: ASO: 165.7 IU/mL (ref 0.0–200.0)

## 2015-05-23 ENCOUNTER — Other Ambulatory Visit: Payer: Self-pay | Admitting: Nurse Practitioner

## 2015-05-23 DIAGNOSIS — R109 Unspecified abdominal pain: Secondary | ICD-10-CM

## 2015-05-25 ENCOUNTER — Encounter (HOSPITAL_COMMUNITY): Admission: EM | Disposition: A | Discharge status: Home Health | Payer: Self-pay | Source: Home / Self Care

## 2015-05-25 ENCOUNTER — Emergency Department (HOSPITAL_COMMUNITY): Payer: Medicaid Other

## 2015-05-25 ENCOUNTER — Inpatient Hospital Stay (HOSPITAL_COMMUNITY)
Admission: EM | Admit: 2015-05-25 | Discharge: 2015-06-01 | DRG: 330 | Disposition: A | Discharge status: Home Health | Payer: Medicaid Other | Attending: General Surgery | Admitting: General Surgery

## 2015-05-25 ENCOUNTER — Emergency Department (HOSPITAL_COMMUNITY): Payer: Medicaid Other | Admitting: Anesthesiology

## 2015-05-25 ENCOUNTER — Encounter (HOSPITAL_COMMUNITY): Payer: Self-pay | Admitting: *Deleted

## 2015-05-25 DIAGNOSIS — K219 Gastro-esophageal reflux disease without esophagitis: Secondary | ICD-10-CM | POA: Diagnosis present

## 2015-05-25 DIAGNOSIS — G8929 Other chronic pain: Secondary | ICD-10-CM | POA: Diagnosis present

## 2015-05-25 DIAGNOSIS — Z79891 Long term (current) use of opiate analgesic: Secondary | ICD-10-CM

## 2015-05-25 DIAGNOSIS — Z79899 Other long term (current) drug therapy: Secondary | ICD-10-CM

## 2015-05-25 DIAGNOSIS — J302 Other seasonal allergic rhinitis: Secondary | ICD-10-CM | POA: Diagnosis present

## 2015-05-25 DIAGNOSIS — K3532 Acute appendicitis with perforation and localized peritonitis, without abscess: Secondary | ICD-10-CM | POA: Diagnosis present

## 2015-05-25 DIAGNOSIS — Z6841 Body Mass Index (BMI) 40.0 and over, adult: Secondary | ICD-10-CM

## 2015-05-25 DIAGNOSIS — L52 Erythema nodosum: Secondary | ICD-10-CM | POA: Diagnosis present

## 2015-05-25 DIAGNOSIS — K352 Acute appendicitis with generalized peritonitis: Secondary | ICD-10-CM | POA: Diagnosis present

## 2015-05-25 DIAGNOSIS — R109 Unspecified abdominal pain: Secondary | ICD-10-CM | POA: Diagnosis present

## 2015-05-25 DIAGNOSIS — Z6837 Body mass index (BMI) 37.0-37.9, adult: Secondary | ICD-10-CM | POA: Diagnosis not present

## 2015-05-25 DIAGNOSIS — M79673 Pain in unspecified foot: Secondary | ICD-10-CM | POA: Diagnosis present

## 2015-05-25 HISTORY — PX: APPENDECTOMY: SHX54

## 2015-05-25 LAB — URINALYSIS, ROUTINE W REFLEX MICROSCOPIC
Bilirubin Urine: NEGATIVE
Glucose, UA: NEGATIVE mg/dL
Ketones, ur: 15 mg/dL — AB
Nitrite: NEGATIVE
Protein, ur: 30 mg/dL — AB
Specific Gravity, Urine: 1.024 (ref 1.005–1.030)
Urobilinogen, UA: 1 mg/dL (ref 0.0–1.0)
pH: 5.5 (ref 5.0–8.0)

## 2015-05-25 LAB — COMPREHENSIVE METABOLIC PANEL
ALT: 13 U/L — ABNORMAL LOW (ref 14–54)
AST: 13 U/L — ABNORMAL LOW (ref 15–41)
Albumin: 3 g/dL — ABNORMAL LOW (ref 3.5–5.0)
Alkaline Phosphatase: 58 U/L (ref 38–126)
Anion gap: 6 (ref 5–15)
BUN: 10 mg/dL (ref 6–20)
CO2: 27 mmol/L (ref 22–32)
Calcium: 8.9 mg/dL (ref 8.9–10.3)
Chloride: 103 mmol/L (ref 101–111)
Creatinine, Ser: 0.87 mg/dL (ref 0.44–1.00)
GFR calc Af Amer: >60 mL/min (ref >60)
GFR calc non Af Amer: >60 mL/min (ref >60)
Glucose, Bld: 106 mg/dL — ABNORMAL HIGH (ref 65–99)
Potassium: 3.7 mmol/L (ref 3.5–5.1)
Sodium: 136 mmol/L (ref 135–145)
Total Bilirubin: 1.3 mg/dL — ABNORMAL HIGH (ref 0.3–1.2)
Total Protein: 6.7 g/dL (ref 6.5–8.1)

## 2015-05-25 LAB — CBC
HCT: 36.3 % (ref 36.0–46.0)
Hemoglobin: 11.7 g/dL — ABNORMAL LOW (ref 12.0–15.0)
MCH: 27.6 pg (ref 26.0–34.0)
MCHC: 32.2 g/dL (ref 30.0–36.0)
MCV: 85.6 fL (ref 78.0–100.0)
Platelets: 199 10*3/uL (ref 150–400)
RBC: 4.24 MIL/uL (ref 3.87–5.11)
RDW: 13.8 % (ref 11.5–15.5)
WBC: 17.7 10*3/uL — ABNORMAL HIGH (ref 4.0–10.5)

## 2015-05-25 LAB — URINE MICROSCOPIC-ADD ON

## 2015-05-25 LAB — CBC WITH DIFFERENTIAL/PLATELET
Basophils Absolute: 0 10*3/uL (ref 0.0–0.1)
Basophils Relative: 0 % (ref 0–1)
Eosinophils Absolute: 0.1 10*3/uL (ref 0.0–0.7)
Eosinophils Relative: 1 % (ref 0–5)
HCT: 36.6 % (ref 36.0–46.0)
Hemoglobin: 11.7 g/dL — ABNORMAL LOW (ref 12.0–15.0)
Lymphocytes Relative: 7 % — ABNORMAL LOW (ref 12–46)
Lymphs Abs: 1.1 10*3/uL (ref 0.7–4.0)
MCH: 27.2 pg (ref 26.0–34.0)
MCHC: 32 g/dL (ref 30.0–36.0)
MCV: 85.1 fL (ref 78.0–100.0)
Monocytes Absolute: 0.8 10*3/uL (ref 0.1–1.0)
Monocytes Relative: 5 % (ref 3–12)
Neutro Abs: 13.3 10*3/uL — ABNORMAL HIGH (ref 1.7–7.7)
Neutrophils Relative %: 87 % — ABNORMAL HIGH (ref 43–77)
Platelets: 206 10*3/uL (ref 150–400)
RBC: 4.3 MIL/uL (ref 3.87–5.11)
RDW: 14.1 % (ref 11.5–15.5)
WBC: 15.3 10*3/uL — ABNORMAL HIGH (ref 4.0–10.5)

## 2015-05-25 LAB — POC URINE PREG, ED: Preg Test, Ur: NEGATIVE

## 2015-05-25 LAB — I-STAT CG4 LACTIC ACID, ED: Lactic Acid, Venous: 0.46 mmol/L — ABNORMAL LOW (ref 0.5–2.0)

## 2015-05-25 SURGERY — APPENDECTOMY
Anesthesia: General

## 2015-05-25 MED ORDER — IOHEXOL 300 MG/ML  SOLN
25.0000 mL | Freq: Once | INTRAMUSCULAR | Status: AC | PRN
  Administered 2015-05-25: 25 mL via ORAL

## 2015-05-25 MED ORDER — BUPIVACAINE-EPINEPHRINE 0.25% -1:200000 IJ SOLN
INTRAMUSCULAR | Status: DC | PRN
  Administered 2015-05-25: 10 mL

## 2015-05-25 MED ORDER — METRONIDAZOLE IN NACL 5-0.79 MG/ML-% IV SOLN
500.0000 mg | Freq: Three times a day (TID) | INTRAVENOUS | Status: DC
  Administered 2015-05-25 – 2015-06-01 (×21): 500 mg via INTRAVENOUS
  Filled 2015-05-25 (×23): qty 100

## 2015-05-25 MED ORDER — HYDROMORPHONE HCL 1 MG/ML IJ SOLN
1.0000 mg | Freq: Once | INTRAMUSCULAR | Status: AC
  Administered 2015-05-25: 1 mg via INTRAVENOUS
  Filled 2015-05-25: qty 1

## 2015-05-25 MED ORDER — PROMETHAZINE HCL 25 MG/ML IJ SOLN
INTRAMUSCULAR | Status: AC
  Filled 2015-05-25: qty 1

## 2015-05-25 MED ORDER — ONDANSETRON HCL 4 MG/2ML IJ SOLN
4.0000 mg | Freq: Four times a day (QID) | INTRAMUSCULAR | Status: DC | PRN
  Filled 2015-05-25: qty 2

## 2015-05-25 MED ORDER — CEFTRIAXONE SODIUM IN DEXTROSE 40 MG/ML IV SOLN
2.0000 g | INTRAVENOUS | Status: DC
  Administered 2015-05-26 – 2015-06-01 (×7): 2 g via INTRAVENOUS
  Filled 2015-05-25 (×9): qty 50

## 2015-05-25 MED ORDER — SODIUM CHLORIDE 0.9 % IR SOLN
Status: DC | PRN
  Administered 2015-05-25: 1000 mL

## 2015-05-25 MED ORDER — CEFTRIAXONE SODIUM IN DEXTROSE 20 MG/ML IV SOLN: 1.0000 g | INTRAVENOUS | Status: DC

## 2015-05-25 MED ORDER — METRONIDAZOLE IN NACL 5-0.79 MG/ML-% IV SOLN
500.0000 mg | Freq: Once | INTRAVENOUS | Status: AC
  Administered 2015-05-25: 500 mg via INTRAVENOUS
  Filled 2015-05-25: qty 100

## 2015-05-25 MED ORDER — HYDROMORPHONE HCL 1 MG/ML IJ SOLN
0.5000 mg | INTRAMUSCULAR | Status: DC | PRN
  Administered 2015-05-25 (×2): 0.5 mg via INTRAVENOUS

## 2015-05-25 MED ORDER — PROPOFOL 10 MG/ML IV BOLUS
INTRAVENOUS | Status: DC | PRN
  Administered 2015-05-25: 200 mg via INTRAVENOUS

## 2015-05-25 MED ORDER — DIPHENHYDRAMINE HCL 50 MG/ML IJ SOLN
12.5000 mg | Freq: Four times a day (QID) | INTRAMUSCULAR | Status: DC | PRN
  Filled 2015-05-25: qty 0.25

## 2015-05-25 MED ORDER — DEXAMETHASONE SODIUM PHOSPHATE 10 MG/ML IJ SOLN
INTRAMUSCULAR | Status: DC | PRN
  Administered 2015-05-25: 10 mg via INTRAVENOUS

## 2015-05-25 MED ORDER — PHENYLEPHRINE HCL 10 MG/ML IJ SOLN
INTRAMUSCULAR | Status: DC | PRN
  Administered 2015-05-25 (×2): 40 ug via INTRAVENOUS

## 2015-05-25 MED ORDER — NALOXONE HCL 0.4 MG/ML IJ SOLN
0.4000 mg | INTRAMUSCULAR | Status: DC | PRN
  Filled 2015-05-25: qty 1

## 2015-05-25 MED ORDER — ENOXAPARIN SODIUM 40 MG/0.4ML ~~LOC~~ SOLN
40.0000 mg | SUBCUTANEOUS | Status: DC
  Administered 2015-05-26 – 2015-06-01 (×7): 40 mg via SUBCUTANEOUS
  Filled 2015-05-25 (×7): qty 0.4

## 2015-05-25 MED ORDER — HYDROMORPHONE HCL 1 MG/ML IJ SOLN
INTRAMUSCULAR | Status: AC
  Administered 2015-05-25: 0.5 mg via INTRAVENOUS
  Filled 2015-05-25: qty 1

## 2015-05-25 MED ORDER — ONDANSETRON HCL 4 MG PO TABS: 4.0000 mg | ORAL_TABLET | Freq: Four times a day (QID) | ORAL | Status: DC | PRN

## 2015-05-25 MED ORDER — PANTOPRAZOLE SODIUM 40 MG IV SOLR
40.0000 mg | INTRAVENOUS | Status: DC
  Administered 2015-05-25 – 2015-05-30 (×6): 40 mg via INTRAVENOUS
  Filled 2015-05-25 (×6): qty 40

## 2015-05-25 MED ORDER — DEXTROSE 5 % IV SOLN
1.0000 g | Freq: Once | INTRAVENOUS | Status: AC
  Administered 2015-05-25: 1 g via INTRAVENOUS
  Filled 2015-05-25: qty 10

## 2015-05-25 MED ORDER — SODIUM CHLORIDE 0.9 % IV BOLUS (SEPSIS)
1000.0000 mL | Freq: Once | INTRAVENOUS | Status: AC
  Administered 2015-05-25: 1000 mL via INTRAVENOUS

## 2015-05-25 MED ORDER — ONDANSETRON HCL 4 MG/2ML IJ SOLN
4.0000 mg | Freq: Once | INTRAMUSCULAR | Status: AC
  Administered 2015-05-25: 4 mg via INTRAVENOUS
  Filled 2015-05-25: qty 2

## 2015-05-25 MED ORDER — SODIUM CHLORIDE 0.9 % IJ SOLN: 9.0000 mL | INTRAMUSCULAR | Status: DC | PRN

## 2015-05-25 MED ORDER — IOHEXOL 300 MG/ML  SOLN
100.0000 mL | Freq: Once | INTRAMUSCULAR | Status: AC | PRN
  Administered 2015-05-25: 100 mL via INTRAVENOUS

## 2015-05-25 MED ORDER — 0.9 % SODIUM CHLORIDE (POUR BTL) OPTIME
TOPICAL | Status: DC | PRN
  Administered 2015-05-25: 1000 mL

## 2015-05-25 MED ORDER — MIDAZOLAM HCL 5 MG/5ML IJ SOLN
INTRAMUSCULAR | Status: DC | PRN
  Administered 2015-05-25: 2 mg via INTRAVENOUS

## 2015-05-25 MED ORDER — LACTATED RINGERS IV SOLN
INTRAVENOUS | Status: DC | PRN
  Administered 2015-05-25 (×2): via INTRAVENOUS

## 2015-05-25 MED ORDER — ONDANSETRON HCL 4 MG/2ML IJ SOLN
4.0000 mg | Freq: Four times a day (QID) | INTRAMUSCULAR | Status: DC | PRN
  Administered 2015-05-26 – 2015-05-31 (×2): 4 mg via INTRAVENOUS
  Filled 2015-05-25 (×2): qty 2

## 2015-05-25 MED ORDER — FENTANYL CITRATE (PF) 100 MCG/2ML IJ SOLN
INTRAMUSCULAR | Status: DC | PRN
  Administered 2015-05-25: 100 ug via INTRAVENOUS
  Administered 2015-05-25 (×2): 50 ug via INTRAVENOUS
  Administered 2015-05-25 (×2): 100 ug via INTRAVENOUS
  Administered 2015-05-25 (×2): 50 ug via INTRAVENOUS
  Administered 2015-05-25: 100 ug via INTRAVENOUS
  Administered 2015-05-25: 50 ug via INTRAVENOUS
  Administered 2015-05-25: 100 ug via INTRAVENOUS

## 2015-05-25 MED ORDER — HYDROMORPHONE 0.3 MG/ML IV SOLN
INTRAVENOUS | Status: AC
  Filled 2015-05-25: qty 25

## 2015-05-25 MED ORDER — FENTANYL CITRATE (PF) 250 MCG/5ML IJ SOLN
INTRAMUSCULAR | Status: AC
  Filled 2015-05-25: qty 5

## 2015-05-25 MED ORDER — HYDROMORPHONE 0.3 MG/ML IV SOLN
INTRAVENOUS | Status: DC
  Administered 2015-05-25: 2.4 mg via INTRAVENOUS
  Administered 2015-05-25: 19:00:00 via INTRAVENOUS
  Administered 2015-05-26: 3.15 mg via INTRAVENOUS
  Administered 2015-05-26: 2.1 mg via INTRAVENOUS
  Administered 2015-05-26 (×2): via INTRAVENOUS
  Administered 2015-05-26: 6.6 mg via INTRAVENOUS
  Administered 2015-05-26: 2.85 mg via INTRAVENOUS
  Administered 2015-05-26: 3.3 mg via INTRAVENOUS
  Administered 2015-05-27: 1.2 mg via INTRAVENOUS
  Administered 2015-05-27: 3.56 mg via INTRAVENOUS
  Administered 2015-05-28: 1.5 mg via INTRAVENOUS
  Administered 2015-05-28: 3.3 mg via INTRAVENOUS
  Administered 2015-05-28: 3.14 mg via INTRAVENOUS
  Administered 2015-05-28: 1 mg via INTRAVENOUS
  Administered 2015-05-28: 0.6 mg via INTRAVENOUS
  Administered 2015-05-28: 1.95 mg via INTRAVENOUS
  Administered 2015-05-28: 2.7 mg via INTRAVENOUS
  Administered 2015-05-28: 2.1 mg via INTRAVENOUS
  Administered 2015-05-29: 0.9 mg via INTRAVENOUS
  Filled 2015-05-25 (×7): qty 25

## 2015-05-25 MED ORDER — DIPHENHYDRAMINE HCL 12.5 MG/5ML PO ELIX
12.5000 mg | ORAL_SOLUTION | Freq: Four times a day (QID) | ORAL | Status: DC | PRN
  Filled 2015-05-25: qty 5

## 2015-05-25 MED ORDER — PROMETHAZINE HCL 25 MG/ML IJ SOLN
6.2500 mg | INTRAMUSCULAR | Status: DC | PRN
Start: 2015-05-25 — End: 2015-05-25

## 2015-05-25 MED ORDER — BUPIVACAINE-EPINEPHRINE (PF) 0.25% -1:200000 IJ SOLN
INTRAMUSCULAR | Status: AC
  Filled 2015-05-25: qty 30

## 2015-05-25 MED ORDER — ROCURONIUM BROMIDE 100 MG/10ML IV SOLN
INTRAVENOUS | Status: DC | PRN
  Administered 2015-05-25: 40 mg via INTRAVENOUS

## 2015-05-25 MED ORDER — MIDAZOLAM HCL 2 MG/2ML IJ SOLN
INTRAMUSCULAR | Status: AC
  Filled 2015-05-25: qty 2

## 2015-05-25 MED ORDER — HYDROMORPHONE HCL 1 MG/ML IJ SOLN
0.2500 mg | INTRAMUSCULAR | Status: DC | PRN
  Administered 2015-05-25 (×4): 0.5 mg via INTRAVENOUS

## 2015-05-25 MED ORDER — ONDANSETRON HCL 4 MG/2ML IJ SOLN
INTRAMUSCULAR | Status: DC | PRN
  Administered 2015-05-25: 4 mg via INTRAVENOUS

## 2015-05-25 MED ORDER — SUCCINYLCHOLINE CHLORIDE 20 MG/ML IJ SOLN
INTRAMUSCULAR | Status: DC | PRN
  Administered 2015-05-25: 100 mg via INTRAVENOUS

## 2015-05-25 MED ORDER — POTASSIUM CHLORIDE IN NACL 20-0.9 MEQ/L-% IV SOLN
INTRAVENOUS | Status: DC
  Administered 2015-05-25: 20:00:00 via INTRAVENOUS
  Filled 2015-05-25: qty 1000

## 2015-05-25 SURGICAL SUPPLY — 88 items
APL SKNCLS STERI-STRIP NONHPOA (GAUZE/BANDAGES/DRESSINGS) ×1
APPLIER CLIP ROT 10 11.4 M/L (STAPLE)
APR CLP MED LRG 11.4X10 (STAPLE)
BAG SPEC RTRVL LRG 6X4 10 (ENDOMECHANICALS) ×1
BENZOIN TINCTURE PRP APPL 2/3 (GAUZE/BANDAGES/DRESSINGS) ×2 IMPLANT
BLADE SURG ROTATE 9660 (MISCELLANEOUS) IMPLANT
CANISTER SUCTION 2500CC (MISCELLANEOUS) ×2 IMPLANT
CHLORAPREP W/TINT 26ML (MISCELLANEOUS) ×2 IMPLANT
CLIP APPLIE ROT 10 11.4 M/L (STAPLE) IMPLANT
COVER SURGICAL LIGHT HANDLE (MISCELLANEOUS) ×2 IMPLANT
CUTTER FLEX LINEAR 45M (STAPLE) ×2 IMPLANT
DRAIN CHANNEL 19F RND (DRAIN) ×1 IMPLANT
DRAPE LAPAROTOMY T 102X78X121 (DRAPES) ×2 IMPLANT
DRAPE UTILITY XL STRL (DRAPES) ×4 IMPLANT
DRAPE WARM FLUID 44X44 (DRAPE) ×1 IMPLANT
DRSG PAD ABDOMINAL 8X10 ST (GAUZE/BANDAGES/DRESSINGS) ×1 IMPLANT
DRSG TEGADERM 2-3/8X2-3/4 SM (GAUZE/BANDAGES/DRESSINGS) ×4 IMPLANT
DRSG TEGADERM 4X4.75 (GAUZE/BANDAGES/DRESSINGS) ×2 IMPLANT
DRSG VAC ATS MED SENSATRAC (GAUZE/BANDAGES/DRESSINGS) ×1 IMPLANT
ELECT BLADE 6.5 EXT (BLADE) ×1 IMPLANT
ELECT CAUTERY BLADE 6.4 (BLADE) ×1 IMPLANT
ELECT REM PT RETURN 9FT ADLT (ELECTROSURGICAL) ×2
ELECTRODE REM PT RTRN 9FT ADLT (ELECTROSURGICAL) ×1 IMPLANT
ENDOLOOP SUT PDS II  0 18 (SUTURE)
ENDOLOOP SUT PDS II 0 18 (SUTURE) IMPLANT
EVACUATOR SILICONE 100CC (DRAIN) ×1 IMPLANT
FILTER SMOKE EVAC LAPAROSHD (FILTER) ×2 IMPLANT
GAUZE SPONGE 2X2 8PLY STRL LF (GAUZE/BANDAGES/DRESSINGS) ×1 IMPLANT
GAUZE SPONGE 4X4 12PLY STRL (GAUZE/BANDAGES/DRESSINGS) ×2 IMPLANT
GLOVE BIO SURGEON STRL SZ7 (GLOVE) ×2 IMPLANT
GLOVE BIOGEL PI IND STRL 7.5 (GLOVE) ×1 IMPLANT
GLOVE BIOGEL PI INDICATOR 7.5 (GLOVE) ×1
GOWN STRL REUS W/ TWL LRG LVL3 (GOWN DISPOSABLE) ×3 IMPLANT
GOWN STRL REUS W/TWL LRG LVL3 (GOWN DISPOSABLE) ×6
KIT BASIN OR (CUSTOM PROCEDURE TRAY) ×2 IMPLANT
KIT ROOM TURNOVER OR (KITS) ×2 IMPLANT
LIGASURE IMPACT 36 18CM CVD LR (INSTRUMENTS) ×1 IMPLANT
NS IRRIG 1000ML POUR BTL (IV SOLUTION) ×2 IMPLANT
PACK GENERAL/GYN (CUSTOM PROCEDURE TRAY) ×2 IMPLANT
PAD ARMBOARD 7.5X6 YLW CONV (MISCELLANEOUS) ×4 IMPLANT
PAD NEG PRESSURE SENSATRAC (MISCELLANEOUS) ×1 IMPLANT
PENCIL BUTTON HOLSTER BLD 10FT (ELECTRODE) ×1 IMPLANT
POUCH SPECIMEN RETRIEVAL 10MM (ENDOMECHANICALS) ×2 IMPLANT
RELOAD PROXIMATE 75MM BLUE (ENDOMECHANICALS) ×8 IMPLANT
RELOAD STAPLE 45 3.5 BLU ETS (ENDOMECHANICALS) ×1 IMPLANT
RELOAD STAPLE 75 3.8 BLU REG (ENDOMECHANICALS) IMPLANT
RELOAD STAPLE TA45 3.5 REG BLU (ENDOMECHANICALS) ×2 IMPLANT
SCALPEL HARMONIC ACE (MISCELLANEOUS) ×2 IMPLANT
SET IRRIG TUBING LAPAROSCOPIC (IRRIGATION / IRRIGATOR) ×2 IMPLANT
SPECIMEN JAR SMALL (MISCELLANEOUS) ×2 IMPLANT
SPONGE GAUZE 2X2 STER 10/PKG (GAUZE/BANDAGES/DRESSINGS) ×1
SPONGE GAUZE 4X4 12PLY STER LF (GAUZE/BANDAGES/DRESSINGS) ×1 IMPLANT
SPONGE LAP 18X18 X RAY DECT (DISPOSABLE) ×2 IMPLANT
SPONGE LAP 4X18 X RAY DECT (DISPOSABLE) ×2 IMPLANT
STAPLER GUN LINEAR PROX 60 (STAPLE) ×1 IMPLANT
STAPLER PROXIMATE 75MM BLUE (STAPLE) ×1 IMPLANT
STAPLER VISISTAT 35W (STAPLE) ×3 IMPLANT
STRIP CLOSURE SKIN 1/2X4 (GAUZE/BANDAGES/DRESSINGS) ×2 IMPLANT
SUCTION POOLE TIP (SUCTIONS) ×1 IMPLANT
SUT CHROMIC GUT AB #0 18 (SUTURE) ×2 IMPLANT
SUT ETHILON 2 0 FS 18 (SUTURE) ×1 IMPLANT
SUT MNCRL AB 4-0 PS2 18 (SUTURE) ×2 IMPLANT
SUT PDS AB 0 CT 36 (SUTURE) ×2 IMPLANT
SUT PDS AB 1 TP1 96 (SUTURE) ×2 IMPLANT
SUT PDS AB 3-0 SH 27 (SUTURE) IMPLANT
SUT PDS AB 5-0 P3 18 (SUTURE) ×2 IMPLANT
SUT SILK 2 0 (SUTURE) ×2
SUT SILK 2 0 SH CR/8 (SUTURE) ×1 IMPLANT
SUT SILK 2-0 18XBRD TIE 12 (SUTURE) ×1 IMPLANT
SUT SILK 3 0 (SUTURE) ×2
SUT SILK 3 0 SH 30 (SUTURE) ×2 IMPLANT
SUT SILK 3 0 SH CR/8 (SUTURE) ×3 IMPLANT
SUT SILK 3 0 TIES 10X30 (SUTURE) ×2 IMPLANT
SUT SILK 3-0 18XBRD TIE 12 (SUTURE) ×1 IMPLANT
SUT VIC AB 4-0 SH 18 (SUTURE) ×3 IMPLANT
SWAB COLLECTION DEVICE MRSA (MISCELLANEOUS) IMPLANT
SYR BULB IRRIGATION 50ML (SYRINGE) ×1 IMPLANT
TAPE CLOTH SURG 6X10 WHT LF (GAUZE/BANDAGES/DRESSINGS) ×1 IMPLANT
TOWEL OR 17X24 6PK STRL BLUE (TOWEL DISPOSABLE) ×2 IMPLANT
TOWEL OR 17X26 10 PK STRL BLUE (TOWEL DISPOSABLE) ×2 IMPLANT
TRAY LAPAROSCOPIC MC (CUSTOM PROCEDURE TRAY) ×2 IMPLANT
TROCAR XCEL BLADELESS 5X75MML (TROCAR) ×4 IMPLANT
TROCAR XCEL BLUNT TIP 100MML (ENDOMECHANICALS) ×2 IMPLANT
TUBE ANAEROBIC SPECIMEN COL (MISCELLANEOUS) IMPLANT
TUBING BULK SUCTION (MISCELLANEOUS) ×1 IMPLANT
TUBING INSUFFLATION (TUBING) ×2 IMPLANT
WATER STERILE IRR 1000ML POUR (IV SOLUTION) IMPLANT
YANKAUER SUCT BULB TIP NO VENT (SUCTIONS) ×2 IMPLANT

## 2015-05-25 NOTE — Anesthesia Preprocedure Evaluation (Addendum)
Anesthesia Evaluation  Patient identified by MRN, date of birth, ID band Patient awake    Reviewed: Allergy & Precautions, NPO status , Patient's Chart, lab work & pertinent test results  History of Anesthesia Complications Negative for: history of anesthetic complications  Airway Mallampati: II  TM Distance: >3 FB Neck ROM: Full    Dental  (+) Teeth Intact, Dental Advisory Given   Pulmonary neg pulmonary ROS,    Pulmonary exam normal       Cardiovascular negative cardio ROS Normal cardiovascular exam    Neuro/Psych PSYCHIATRIC DISORDERS Anxiety negative neurological ROS     GI/Hepatic Neg liver ROS, GERD-  ,  Endo/Other  Morbid obesity  Renal/GU negative Renal ROS     Musculoskeletal   Abdominal   Peds  Hematology   Anesthesia Other Findings   Reproductive/Obstetrics                            Anesthesia Physical Anesthesia Plan  ASA: III and emergent  Anesthesia Plan: General   Post-op Pain Management:    Induction: Intravenous, Rapid sequence and Cricoid pressure planned  Airway Management Planned: Oral ETT  Additional Equipment:   Intra-op Plan:   Post-operative Plan: Extubation in OR  Informed Consent: I have reviewed the patients History and Physical, chart, labs and discussed the procedure including the risks, benefits and alternatives for the proposed anesthesia with the patient or authorized representative who has indicated his/her understanding and acceptance.   Dental advisory given  Plan Discussed with: CRNA, Anesthesiologist and Surgeon  Anesthesia Plan Comments:        Anesthesia Quick Evaluation

## 2015-05-25 NOTE — ED Provider Notes (Signed)
CSN: [id number]     Arrival date & time 05/25/15  4098 History   First MD Initiated Contact with Patient 05/25/15 [phone]     Chief Complaint  Patient presents with  . Abdominal Pain     (Consider location/radiation/quality/duration/timing/severity/associated sxs/prior Treatment) HPI Comments: Patient's presents with complaint of abdominal pain for the past 2-3 weeks, described as a dull pain, upper abdomen on both sides without radiation. Pain has become gradually worse. It is associated with nausea but no vomiting. No fever, chest pain, cough. Patient had an episode of soft stool yesterday but has not had significant diarrhea. No urinary symptoms. Patient is currently on her menstrual period. No history of abdominal surgeries. She has had abdominal pain in the past evaluated with CT scan and ultrasound. No history of gallbladder disease. Patient saw her doctor 2 days ago. They scheduled her for an ultrasound in 2 days. No history of inflammatory bowel disease. No EtOH or heavy NSAID use. Recent workup for erythema nodosum. She was on a course of prednisone which helped her symptoms. She has referral pending to a rheumatologist. She was on antibiotics several weeks ago prior to being put on prednisone.  Patient is a 29 y.o. female presenting with abdominal pain. The history is provided by the patient and medical records.  Abdominal Pain Associated symptoms: diarrhea and nausea   Associated symptoms: no chest pain, no cough, no dysuria, no fever, no sore throat and no vomiting     Past Medical History  Diagnosis Date  . GERD (gastroesophageal reflux disease)   . Foot pain   . Gastritis [id number]  . Seasonal allergies    Past Surgical History  Procedure Laterality Date  . Foot surgery    . Wrist surgery    . Breast biopsy Right 05/03/13   Family History  Problem Relation Age of Onset  . Hypertension Father   . Diabetes Father   . Liver cancer Paternal Grandfather   . Diabetes Maternal  Grandmother   . Kidney failure Maternal Grandmother    History  Substance Use Topics  . Smoking status: Never Smoker   . Smokeless tobacco: Never Used  . Alcohol Use: Yes     Comment: occ   OB History    Gravida Para Term Preterm AB TAB SAB Ectopic Multiple Living   Obstetric Comments   1st Menstrual Cycle:  12 1st Pregnancy: 20     Review of Systems  Constitutional: Negative for fever.  HENT: Negative for rhinorrhea and sore throat.   Eyes: Negative for redness.  Respiratory: Negative for cough.   Cardiovascular: Negative for chest pain.  Gastrointestinal: Positive for nausea, abdominal pain and diarrhea. Negative for vomiting and blood in stool.  Genitourinary: Negative for dysuria.  Musculoskeletal: Negative for myalgias.  Skin: Negative for rash.  Neurological: Negative for headaches.      Allergies  Morphine and related  Home Medications   Prior to Admission medications   Medication Sig Start Date End Date Taking? Authorizing Provider  cyclobenzaprine (FLEXERIL) 10 MG tablet Take 10 mg by mouth 2 (two) times daily as needed for muscle spasms.    Historical Provider, MD  dicyclomine (BENTYL) 20 MG tablet Take 1 tablet (20 mg total) by mouth 2 (two) times daily. 05/09/15   Jennifer Piepenbrink, PA-C  fexofenadine (ALLEGRA) 180 MG tablet Take 180 mg by mouth daily.    Historical Provider, MD  ibuprofen (  ADVIL,MOTRIN) 600 MG tablet Take 1 tablet (600 mg total) by mouth every 6 (six) hours as needed. 05/09/15   Jennifer Piepenbrink, PA-C  ibuprofen (ADVIL,MOTRIN) 800 MG tablet Take 1 tablet (800 mg total) by mouth every 8 (eight) hours as needed for moderate pain. 04/27/15   Charm Rings, MD  omeprazole (PRILOSEC) 20 MG capsule Take 20 mg by mouth daily.    Historical Provider, MD  ondansetron (ZOFRAN ODT) 4 MG disintegrating tablet Take 1 tablet (4 mg total) by mouth every 8 (eight) hours as needed for nausea or vomiting. 05/09/15   Jennifer Piepenbrink,  PA-C  predniSONE (DELTASONE) 10 MG tablet Take 1 tablet (10 mg total) by mouth daily. Take 60 mg PO daily for 2 days. Take 50 mg PO daily for two days. Take 40 mg by mouth daily for 2 days. Take 30 mg PO daily for two days. Take 20 mg PO daily for two days. Take 10 mg PO daily for two days. 05/09/15   Jennifer Piepenbrink, PA-C  sertraline (ZOLOFT) 50 MG tablet Take 50 mg by mouth daily.    Historical Provider, MD   BP 127/69 mmHg  Pulse 111  Temp(Src) 99.6 F (37.6 C)  Resp 16  Ht 5\' 6"  (1.676 m)  Wt 231 lb 7 oz (104.979 kg)  BMI 37.37 kg/m2  SpO2 97%  LMP 05/04/2015   Physical Exam  Constitutional: She appears well-developed and well-nourished.  HENT:  Head: Normocephalic and atraumatic.  Slightly dry mucous membranes  Eyes: Conjunctivae are normal. Right eye exhibits no discharge. Left eye exhibits no discharge.  Neck: Normal range of motion. Neck supple.  Cardiovascular: Regular rhythm and normal heart sounds.  Tachycardia present.   Pulmonary/Chest: Effort normal and breath sounds normal. No respiratory distress. She has no wheezes. She has no rales.  Abdominal: Soft. Bowel sounds are decreased. There is tenderness in the right upper quadrant, epigastric area, periumbilical area and left upper quadrant. There is no rigidity, no rebound, no guarding, no CVA tenderness, no tenderness at McBurney's point and negative Murphy's sign.  Neurological: She is alert.  Skin: Skin is warm and dry.  Psychiatric: She has a normal mood and affect.  Nursing note and vitals reviewed.   ED Course  Procedures (including critical care time) Labs Review Labs Reviewed  CBC WITH DIFFERENTIAL/PLATELET - Abnormal; Notable for the following:    WBC 15.3 (*)    Hemoglobin 11.7 (*)    Neutrophils Relative % 87 (*)    Neutro Abs 13.3 (*)    Lymphocytes Relative 7 (*)    All other components within normal limits  COMPREHENSIVE METABOLIC PANEL - Abnormal; Notable for the following:    Glucose, Bld 106  (*)    Albumin 3.0 (*)    AST 13 (*)    ALT 13 (*)    Total Bilirubin 1.3 (*)    All other components within normal limits  URINALYSIS, ROUTINE W REFLEX MICROSCOPIC (NOT AT [facility]) - Abnormal; Notable for the following:    Color, Urine ORANGE (*)    APPearance TURBID (*)    Hgb urine dipstick MODERATE (*)    Ketones, ur 15 (*)    Protein, ur 30 (*)    Leukocytes, UA LARGE (*)    All other components within normal limits  URINE MICROSCOPIC-ADD ON - Abnormal; Notable for the following:    Bacteria, UA MANY (*)    All other components within normal limits  POC URINE PREG, ED  I-STAT CG4  LACTIC ACID, ED    Imaging Review Ct Abdomen Pelvis W Contrast  05/25/2015   CLINICAL DATA:  Epigastric pain for several months, worsening 2 days ago. Nausea. History of GERD and gastritis.  EXAM: CT ABDOMEN AND PELVIS WITH CONTRAST  TECHNIQUE: Multidetector CT imaging of the abdomen and pelvis was performed using the standard protocol following bolus administration of intravenous contrast.  CONTRAST:  OMNIPAQUE IOHEXOL 300 MG/ML  SOLN  COMPARISON:  02/27/2015  FINDINGS: Mild dependent atelectasis present in the lung bases.  The liver, gallbladder, spleen, adrenal glands, left kidney, and pancreas have an unremarkable enhanced appearance. A 3 mm low-density lesion in the anterior interpolar right kidney is too small to characterize but most likely represents a cyst.  Oral contrast is present in loops of nondilated small and large bowel to the level of the ascending colon without evidence of obstruction. There is new, extensive inflammation in the right pelvis just superior to the right adnexa. This inflammation extends to the cecum. A normal appendix is not identified, however the inflammation appears greatest where the appendix was seen to be located on the prior CT, and on coronal images there is a tubular low-density structure coursing from the cecum into the inflammation which likely reflects a dilated,  inflamed appendix (series 203, image 56). The region of phlegmonous change measures approximately 7.3 x 5.1 cm, and there may be a tiny locule of extraluminal gas. There is wall thickening involving the distal ileum, likely secondary inflammation rather than a primary small bowel process. No well-defined, drainable fluid collection is identified.  Right lower quadrant and aortocaval lymph nodes measure up to 1.3 cm in short axis, likely reactive. The bladder is unremarkable. Uterus and left ovary are grossly unremarkable. Periappendiceal inflammation extends into the right adnexa to involve the right ovary. There is likely trace free fluid in the pelvis. No intraperitoneal free air is identified. No acute osseous abnormality is identified.  IMPRESSION: Extensive inflammation in the right lower quadrant, most consistent with acute perforated appendicitis with phlegmon formation. No well-defined abscess.  These results were called by telephone at the time of interpretation on 05/25/2015 at 12:22 pm to Dr. Donato Heinz, who verbally acknowledged these results.   Electronically Signed   By: Sebastian Ache   On: 05/25/2015 12:24     EKG Interpretation None      8:16 AM Patient seen and examined. Work-up initiated. Medications ordered.   Vital signs reviewed and are as follows: BP 127/69 mmHg  Pulse 111  Temp(Src) 99.6 F (37.6 C)  Resp 16  Ht 5\' 6"  (1.676 m)  Wt 231 lb 7 oz (104.979 kg)  BMI 37.37 kg/m2  SpO2 97%  LMP 05/04/2015  12:41 PM Patient re-examined several times while in ED without change in exam.   CT shows perforated appendicitis. Discussed with Dr. Silverio Lay who has seen. Discussed with Dr. Harlon Flor who will see. Antibiotics ordered. Pending admit.   MDM   Final diagnoses:  Abdominal pain  Acute perforated appendicitis   Admit.    Renne Crigler, PA-C 05/25/15 1242  Richardean Canal, MD 05/26/15 [phone]

## 2015-05-25 NOTE — Anesthesia Procedure Notes (Signed)
Procedure Name: Intubation Date/Time: 05/25/2015 3:14 PM Performed by: Adonis Housekeeper Pre-anesthesia Checklist: Patient identified, Emergency Drugs available, Suction available and Patient being monitored Patient Re-evaluated:Patient Re-evaluated prior to inductionOxygen Delivery Method: Circle system utilized Preoxygenation: Pre-oxygenation with 100% oxygen Intubation Type: IV induction, Rapid sequence and Cricoid Pressure applied Laryngoscope Size: Mac and 3 Grade View: Grade I Tube type: Oral Tube size: 7.5 mm Number of attempts: 1 Airway Equipment and Method: Stylet Placement Confirmation: ETT inserted through vocal cords under direct vision,  positive ETCO2 and breath sounds checked- equal and bilateral Secured at: 22 cm Tube secured with: Tape Dental Injury: Teeth and Oropharynx as per pre-operative assessment

## 2015-05-25 NOTE — ED Notes (Signed)
Dr Emmit Alexanders, PA no blood cultures prior to ABX.

## 2015-05-25 NOTE — Op Note (Signed)
Preop diagnosis: Acute perforated appendicitis Postop diagnosis: Same Procedure performed: Attempted left scopic appendectomy converted to exploratory laparotomy with ileocecectomy; placement of abdominal wound vac Surgeon:Roena Sassaman K. Asst.: Dr. Gaynelle Adu Anesthesia:  GETT Indications: This is a 29 year old female who presents with proximally 2 weeks of vague abdominal pain. She was seen in the [facility] department. 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 Her pain worsened yesterday and she presented for evaluation today. She was noted have a high white count. A CT scan showed a large amount of inflammation the right lower quadrant with a 7 cm phlegmon. The appendix is not visualized but this is in the same area where her appendix was previously identified on CT scan. There is no abscess to be drainage. Due to the patient's clinical status and appears of the CT scan we recommended laparoscopic possible open appendectomy.  Description of procedure: The patient's brought to the operating room and placed in supine position on the operating table. After an adequate level of general anesthesia was obtained a Foley catheter was placed under sterile technique. The patient's abdomen was prepped with ChloraPrep and draped sterile fashion. A timeout was taken to ensure the proper patient and proper procedure. We made a transverse incision below her umbilicus. Dissection was carried down the fascia which was divided vertically. The peritoneal cavity bluntly entered. A stay suture of 0 Vicryl was placed on the fascial opening. The Hassan cannula was inserted and secured to stay suture. Pneumoperitoneum was obtained by splitting CO2 maintaining a maximum pressure of 15 mmHg. The laparoscope was inserted. No purulent fluid was noted. A 5 mm ports placed in the right upper quadrant and another 5 mm port in the left lower quadrant.  We attempted to mobilize the cecum medially. There is a mass of inflamed adherent terminal ileum to the cecum. This is very firm. We spent a few minutes trended mobilize this and to dissect the small bowel free from the underlying appendix. However it became fairly obvious that this was not to be successful. We converted to an open procedure by making a lower midline incision. We released pneumoperitoneum and removed all of our trochars. We open the fascia widely and placed the Balfour retractor. The right colon was mobilized with blunt dissection and cautery. We were able to bluntly dissected this large inflammatory mass up into the field. We divided the terminal ileum with a GIA-75 stapler. The mesentery of the terminal ileum and cecum were divided with the LigaSure device. We mobilized the cecum up into the field and divided the right colon just above the cecum. The specimen was sent for pathologic examination. It appears that the appendix had perforated in the center and had created a large inflammatory mass. There was minimal gross purulence. There is no spillage of stool. We irrigated the wound thoroughly. We created a side-to-side stapled anastomosis between the terminal ileum and the right colon with a GIA-75 and a TA 60 stapler. The mesenteric defect was closed with 20 silks. Prilosec was used to reinforce crotch of the anastomosis. The TA 60 staple line was oversewn with 3-0 silk sutures. We irrigated again. A 19 [place] Blake drain was brought in through her left lower quadrant port site and placed in the old abscess cavity. The fascia was reapproximated with 2 double-stranded #1 PDS sutures. The subcutaneous tissues were irrigated. A medium VAC sponge was placed in the wound. This was  sealed with an occlusive drape placed to 1 25 mmHg suction. The right upper quadrant port site was stable showed. The drain in left lower quadrant was placed in a JP bulb.  The patient was then expanded and brought to  recovery room  in stable condition. All sponge, initially, and needle counts are correct. Wilmon Arms. Corliss Skains, MD, [facility] Surgery  General/ Trauma Surgery  05/25/2015 6:00 PM

## 2015-05-25 NOTE — Anesthesia Postprocedure Evaluation (Signed)
Anesthesia Post Note  Patient: [name]  Procedure(s) Performed: Procedure(s) (LRB): APPENDECTOMY (N/A)  Anesthesia type: general  Patient location: PACU  Post pain: Pain level controlled  Post assessment: Patient's Cardiovascular Status Stable  Last Vitals:  Filed Vitals:   05/25/15 1940  BP: 130/85  Pulse: 108  Temp: 37 C  Resp: 13    Post vital signs: Reviewed and stable  Level of consciousness: sedated  Complications: No apparent anesthesia complications

## 2015-05-25 NOTE — H&P (Signed)
[name] is an 29 y.o. female.   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  She has had nausea but no vomiting.  No previous abdominal surgery.  She presented to the ED today and CT scan showed what appears to be an area of significant inflammation in the RLQ in the area of the appendix with possible perforation.  No abscess but 7 cm phlegmon.  We are asked to see the patient for further management.   Past Medical History  Diagnosis Date  . GERD (gastroesophageal reflux disease)   . Foot pain   . Gastritis [id number]  . Seasonal allergies     Past Surgical History  Procedure Laterality Date  . Foot surgery    . Wrist surgery    . Breast biopsy Right 05/03/13    Family History  Problem Relation Age of Onset  . Hypertension Father   . Diabetes Father   . Liver cancer Paternal Grandfather   . Diabetes Maternal Grandmother   . Kidney failure Maternal Grandmother    Social History:  reports that she has never smoked. She has never used smokeless tobacco. She reports that she drinks alcohol. She reports that she does not use illicit drugs.  Allergies:  Allergies  Allergen Reactions  . Morphine And Related Other (See Comments)    "whole body feels aggravated"    Prior to Admission medications   Medication Sig Start Date End Date Taking? Authorizing Provider  dicyclomine (BENTYL) 20 MG tablet Take 1 tablet (20 mg total) by mouth 2 (two) times daily. 05/09/15  Yes Jennifer Piepenbrink, PA-C  fexofenadine (ALLEGRA) 180 MG tablet Take 180 mg by mouth daily.   Yes Historical Provider, MD  omeprazole (PRILOSEC) 20 MG capsule Take 20 mg by mouth daily.   Yes Historical Provider, MD  ondansetron (ZOFRAN ODT) 4 MG disintegrating tablet Take 1 tablet (4 mg total) by mouth every 8 (eight) hours as needed for nausea or  vomiting. 05/09/15  Yes Jennifer Piepenbrink, PA-C  oxyCODONE (ROXICODONE) 15 MG immediate release tablet Take 15 mg by mouth daily as needed for pain.  05/16/15  Yes Historical Provider, MD  promethazine (PHENERGAN) 12.5 MG tablet Take 12.5 mg by mouth daily as needed. 02/28/15  Yes Historical Provider, MD  sertraline (ZOLOFT) 100 MG tablet Take 100 mg by mouth daily. 05/16/15  Yes Historical Provider, MD  ibuprofen (ADVIL,MOTRIN) 600 MG tablet Take 1 tablet (600 mg total) by mouth every 6 (six) hours as needed. 05/09/15   Jennifer Piepenbrink, PA-C  ibuprofen (ADVIL,MOTRIN) 800 MG tablet Take 1 tablet (800 mg total) by mouth every 8 (eight) hours as needed for moderate pain. 04/27/15   Melony Overly, MD  predniSONE (DELTASONE) 10 MG tablet Take 1 tablet (10 mg total) by mouth daily. Take 60 mg PO daily for 2 days. Take 50 mg PO daily for two days. Take 40 mg by mouth daily for 2 days. Take 30 mg PO daily for two days. Take 20 mg PO daily for two days. Take 10 mg PO daily for two days. 05/09/15   Baron Sane, PA-C     Results for orders placed or performed during the hospital encounter of 05/25/15 (from the past 48 hour(s))  CBC with Differential/Platelet     Status: Abnormal   Collection Time: 05/25/15  8:12 AM  Result Value Ref Range   WBC 15.3 (H) 4.0 - 10.5 K/uL   RBC 4.30 3.87 - 5.11 MIL/uL   Hemoglobin 11.7 (L) 12.0 - 15.0 g/dL   HCT 36.6 36.0 - 46.0 %   MCV 85.1 78.0 - 100.0 fL   MCH 27.2 26.0 - 34.0 pg   MCHC 32.0 30.0 - 36.0 g/dL   RDW 14.1 11.5 - 15.5 %   Platelets 206 150 - 400 K/uL   Neutrophils Relative % 87 (H) 43 - 77 %   Neutro Abs 13.3 (H) 1.7 - 7.7 K/uL   Lymphocytes Relative 7 (L) 12 - 46 %   Lymphs Abs 1.1 0.7 - 4.0 K/uL   Monocytes Relative 5 3 - 12 %   Monocytes Absolute 0.8 0.1 - 1.0 K/uL   Eosinophils Relative 1 0 - 5 %   Eosinophils Absolute 0.1 0.0 - 0.7 K/uL   Basophils Relative 0 0 - 1 %   Basophils Absolute 0.0 0.0 - 0.1 K/uL  Comprehensive metabolic panel      Status: Abnormal   Collection Time: 05/25/15  8:12 AM  Result Value Ref Range   Sodium 136 135 - 145 mmol/L   Potassium 3.7 3.5 - 5.1 mmol/L   Chloride 103 101 - 111 mmol/L   CO2 27 22 - 32 mmol/L   Glucose, Bld 106 (H) 65 - 99 mg/dL   BUN 10 6 - 20 mg/dL   Creatinine, Ser 0.87 0.44 - 1.00 mg/dL   Calcium 8.9 8.9 - 10.3 mg/dL   Total Protein 6.7 6.5 - 8.1 g/dL   Albumin 3.0 (L) 3.5 - 5.0 g/dL   AST 13 (L) 15 - 41 U/L   ALT 13 (L) 14 - 54 U/L   Alkaline Phosphatase 58 38 - 126 U/L   Total Bilirubin 1.3 (H) 0.3 - 1.2 mg/dL   GFR calc non Af Amer >60 >60 mL/min   GFR calc Af Amer >60 >60 mL/min    Comment: (NOTE) The eGFR has been calculated using the CKD EPI equation. This calculation has not been validated in all clinical situations. eGFR's persistently <60 mL/min signify possible Chronic Kidney Disease.    Anion gap 6 5 - 15  Urinalysis, Routine w reflex microscopic (not at [facility])     Status: Abnormal   Collection Time: 05/25/15  9:59 AM  Result Value Ref Range   Color, Urine ORANGE (A) YELLOW    Comment: BIOCHEMICALS MAY BE AFFECTED BY COLOR   APPearance TURBID (A) CLEAR   Specific Gravity, Urine 1.024 1.005 - 1.030   pH 5.5 5.0 - 8.0   Glucose, UA NEGATIVE NEGATIVE mg/dL   Hgb urine dipstick MODERATE (A) NEGATIVE   Bilirubin Urine NEGATIVE NEGATIVE   Ketones, ur 15 (A) NEGATIVE mg/dL   Protein, ur 30 (A) NEGATIVE mg/dL   Urobilinogen, UA 1.0 0.0 - 1.0 mg/dL   Nitrite NEGATIVE NEGATIVE   Leukocytes, UA LARGE (A) NEGATIVE  Urine microscopic-add on     Status: Abnormal   Collection Time: 05/25/15  9:59 AM  Result Value Ref Range   Squamous Epithelial / LPF RARE RARE   WBC, UA TOO NUMEROUS TO COUNT <3 WBC/hpf   Bacteria, UA MANY (A) RARE  POC urine preg, ED (not at [facility])     Status: None   Collection Time: 05/25/15 10:47 AM  Result Value Ref Range   Preg Test, Ur NEGATIVE NEGATIVE    Comment:        THE SENSITIVITY OF  THIS METHODOLOGY IS >24 mIU/mL    Ct  Abdomen Pelvis W Contrast  05/25/2015   CLINICAL DATA:  Epigastric pain for several months, worsening 2 days ago. Nausea. History of GERD and gastritis.  EXAM: CT ABDOMEN AND PELVIS WITH CONTRAST  TECHNIQUE: Multidetector CT imaging of the abdomen and pelvis was performed using the standard protocol following bolus administration of intravenous contrast.  CONTRAST:  17m OMNIPAQUE IOHEXOL 300 MG/ML  SOLN  COMPARISON:  02/27/2015  FINDINGS: Mild dependent atelectasis present in the lung bases.  The liver, gallbladder, spleen, adrenal glands, left kidney, and pancreas have an unremarkable enhanced appearance. A 3 mm low-density lesion in the anterior interpolar right kidney is too small to characterize but most likely represents a cyst.  Oral contrast is present in loops of nondilated small and large bowel to the level of the ascending colon without evidence of obstruction. There is new, extensive inflammation in the right pelvis just superior to the right adnexa. This inflammation extends to the cecum. A normal appendix is not identified, however the inflammation appears greatest where the appendix was seen to be located on the prior CT, and on coronal images there is a tubular low-density structure coursing from the cecum into the inflammation which likely reflects a dilated, inflamed appendix (series 203, image 56). The region of phlegmonous change measures approximately 7.3 x 5.1 cm, and there may be a tiny locule of extraluminal gas. There is wall thickening involving the distal ileum, likely secondary inflammation rather than a primary small bowel process. No well-defined, drainable fluid collection is identified.  Right lower quadrant and aortocaval lymph nodes measure up to 1.3 cm in short axis, likely reactive. The bladder is unremarkable. Uterus and left ovary are grossly unremarkable. Periappendiceal inflammation extends into the right adnexa to involve the right ovary. There is likely trace free fluid in  the pelvis. No intraperitoneal free air is identified. No acute osseous abnormality is identified.  IMPRESSION: Extensive inflammation in the right lower quadrant, most consistent with acute perforated appendicitis with phlegmon formation. No well-defined abscess.  These results were called by telephone at the time of interpretation on 05/25/2015 at 12:22 pm to Dr. YDominic Pea who verbally acknowledged these results.   Electronically Signed   By: ALogan Bores  On: 05/25/2015 12:24    Review of Systems  Constitutional: Negative for weight loss.  HENT: Negative for ear discharge, ear pain, hearing loss and tinnitus.   Eyes: Negative for blurred vision, double vision, photophobia and pain.  Respiratory: Negative for cough, sputum production and shortness of breath.   Cardiovascular: Negative for chest pain.  Gastrointestinal: Positive for nausea and abdominal pain. Negative for vomiting.  Genitourinary: Negative for dysuria, urgency, frequency and flank pain.  Musculoskeletal: Negative for myalgias, back pain, joint pain, falls and neck pain.  Neurological: Negative for dizziness, tingling, sensory change, focal weakness, loss of consciousness and headaches.  Endo/Heme/Allergies: Does not bruise/bleed easily.  Psychiatric/Behavioral: Negative for depression, memory loss and substance abuse. The patient is not nervous/anxious.     Blood pressure 117/80, pulse 101, temperature 100.1 F (37.8 C), temperature source Oral, resp. rate 16, height '5\' 6"'  (1.676 m), weight 104.979 kg (231 lb 7 oz), last menstrual period 05/23/2015, SpO2 98 %. Physical Exam  WDWN in NAD HEENT:  EOMI, sclera anicteric Neck:  No masses, no thyromegaly Lungs:  CTA bilaterally; normal respiratory effort CV:  Tachycardic and rhythm; no murmurs Abd:  Hypoactive bowel sounds; tender in RLQ, periumbilical Ext:  Well-perfused;  no edema Skin:  Warm, dry; no sign of jaundice  Assessment/Plan Acute appendicitis with possible  perforation, surrounding phlegmon, but no abscess  Plan:  Proceed to OR later today for laparoscopic appendectomy/ possible open appendectomy.  The surgical procedure has been discussed with the patient.  Potential risks, benefits, alternative treatments, and expected outcomes have been explained.  All of the patient's questions at this time have been answered.  The likelihood of reaching the patient's treatment goal is good.  The patient understand the proposed surgical procedure and wishes to proceed.   Briasia Flinders K. 05/25/2015, 1:04 PM

## 2015-05-25 NOTE — ED Notes (Signed)
The pt is c.o abd pain for 2 weeks  Nausea lmp now

## 2015-05-25 NOTE — Transfer of Care (Signed)
Immediate Anesthesia Transfer of Care Note  Patient: [name]  Procedure(s) Performed: Procedure(s): APPENDECTOMY (N/A)  Patient Location: PACU  Anesthesia Type:General  Level of Consciousness: awake, alert  and oriented  Airway & Oxygen Therapy: Patient Spontanous Breathing and Patient connected to nasal cannula oxygen  Post-op Assessment: Report given to RN and Post -op Vital signs reviewed and stable  Post vital signs: Reviewed and stable  Last Vitals:  Filed Vitals:   05/25/15 1330  BP: 129/78  Pulse: 107  Temp:   Resp: 16    Complications: No apparent anesthesia complications

## 2015-05-26 ENCOUNTER — Ambulatory Visit: Payer: Medicaid Other

## 2015-05-26 ENCOUNTER — Encounter (HOSPITAL_COMMUNITY): Payer: Self-pay

## 2015-05-26 LAB — CBC
HCT: 31.6 % — ABNORMAL LOW (ref 36.0–46.0)
Hemoglobin: 10.2 g/dL — ABNORMAL LOW (ref 12.0–15.0)
MCH: 27.7 pg (ref 26.0–34.0)
MCHC: 32.3 g/dL (ref 30.0–36.0)
MCV: 85.9 fL (ref 78.0–100.0)
Platelets: 172 10*3/uL (ref 150–400)
RBC: 3.68 MIL/uL — ABNORMAL LOW (ref 3.87–5.11)
RDW: 13.6 % (ref 11.5–15.5)
WBC: 12.9 10*3/uL — ABNORMAL HIGH (ref 4.0–10.5)

## 2015-05-26 LAB — BASIC METABOLIC PANEL
Anion gap: 6 (ref 5–15)
BUN: 7 mg/dL (ref 6–20)
CO2: 24 mmol/L (ref 22–32)
Calcium: 8.2 mg/dL — ABNORMAL LOW (ref 8.9–10.3)
Chloride: 106 mmol/L (ref 101–111)
Creatinine, Ser: 0.65 mg/dL (ref 0.44–1.00)
GFR calc Af Amer: >60 mL/min (ref >60)
GFR calc non Af Amer: >60 mL/min (ref >60)
Glucose, Bld: 127 mg/dL — ABNORMAL HIGH (ref 65–99)
Potassium: 4.3 mmol/L (ref 3.5–5.1)
Sodium: 136 mmol/L (ref 135–145)

## 2015-05-26 MED ORDER — HYDROMORPHONE HCL 1 MG/ML IJ SOLN
1.0000 mg | INTRAMUSCULAR | Status: DC | PRN
  Administered 2015-05-26 – 2015-05-29 (×23): 1 mg via INTRAVENOUS
  Filled 2015-05-26 (×23): qty 1

## 2015-05-26 MED ORDER — PROMETHAZINE HCL 25 MG/ML IJ SOLN
12.5000 mg | INTRAMUSCULAR | Status: DC | PRN
  Administered 2015-05-26 – 2015-06-01 (×12): 12.5 mg via INTRAVENOUS
  Filled 2015-05-26 (×12): qty 1

## 2015-05-26 MED ORDER — DEXTROSE-NACL 5-0.9 % IV SOLN
INTRAVENOUS | Status: DC
  Administered 2015-05-26 – 2015-05-31 (×9): via INTRAVENOUS

## 2015-05-26 NOTE — Progress Notes (Signed)
Patient ID: [name], female   DOB: [birth year], 29 y.o.   MRN: [id number]     [facility]      [address] [place], [place] [address]    Phone: [phone] FAX: [phone]     Subjective: Pain.  No flatus. No n/v.  VSS.  Afebrile. WBC trending down.   Objective:  Vital signs:  Filed Vitals:   05/26/15 0100 05/26/15 0425 05/26/15 0632 05/26/15 0800  BP: 114/65  104/58   Pulse: 88  77   Temp: 98.1 F (36.7 C)  98.1 F (36.7 C)   TempSrc:      Resp: '16 11 15 17  ' Height:      Weight:      SpO2: 98% 97% 96% 100%       Intake/Output   Yesterday:  07/24 0701 - 07/25 0700 In: 2700 [I.V.:1700; IV Piggyback:1000] Out: 1350 [Urine:950; Drains:100; Blood:300] This shift:  Total I/O In: 1100 [I.V.:1100] Out: -   Physical Exam: General: Pt awake/alert/oriented x4 in no acute distress Chest: cta.  No chest wall pain w good excursion CV:  Pulses intact.  Regular rhythm Abdomen: Soft.  Nondistended.  No bowel sounds.  Tender.  Midline incision-VAC in place, little serous output.  LLQ drain with serosanguinous output.   No evidence of peritonitis.  No incarcerated hernias. Ext:  SCDs BLE.  No mjr edema.  No cyanosis Skin: No petechiae / purpura   Problem List:   Active Problems:   Acute perforated appendicitis    Results:   Labs: Results for orders placed or performed during the hospital encounter of 05/25/15 (from the past 48 hour(s))  CBC with Differential/Platelet     Status: Abnormal   Collection Time: 05/25/15  8:12 AM  Result Value Ref Range   WBC 15.3 (H) 4.0 - 10.5 K/uL   RBC 4.30 3.87 - 5.11 MIL/uL   Hemoglobin 11.7 (L) 12.0 - 15.0 g/dL   HCT 36.6 36.0 - 46.0 %   MCV 85.1 78.0 - 100.0 fL   MCH 27.2 26.0 - 34.0 pg   MCHC 32.0 30.0 - 36.0 g/dL   RDW 14.1 11.5 - 15.5 %   Platelets 206 150 - 400 K/uL   Neutrophils Relative % 87 (H) 43 - 77 %   Neutro Abs 13.3 (H) 1.7 - 7.7 K/uL   Lymphocytes  Relative 7 (L) 12 - 46 %   Lymphs Abs 1.1 0.7 - 4.0 K/uL   Monocytes Relative 5 3 - 12 %   Monocytes Absolute 0.8 0.1 - 1.0 K/uL   Eosinophils Relative 1 0 - 5 %   Eosinophils Absolute 0.1 0.0 - 0.7 K/uL   Basophils Relative 0 0 - 1 %   Basophils Absolute 0.0 0.0 - 0.1 K/uL  Comprehensive metabolic panel     Status: Abnormal   Collection Time: 05/25/15  8:12 AM  Result Value Ref Range   Sodium 136 135 - 145 mmol/L   Potassium 3.7 3.5 - 5.1 mmol/L   Chloride 103 101 - 111 mmol/L   CO2 27 22 - 32 mmol/L   Glucose, Bld 106 (H) 65 - 99 mg/dL   BUN 10 6 - 20 mg/dL   Creatinine, Ser 0.87 0.44 - 1.00 mg/dL   Calcium 8.9 8.9 - 10.3 mg/dL   Total Protein 6.7 6.5 - 8.1 g/dL   Albumin 3.0 (L) 3.5 - 5.0 g/dL   AST 13 (L) 15 - 41 U/L  ALT 13 (L) 14 - 54 U/L   Alkaline Phosphatase 58 38 - 126 U/L   Total Bilirubin 1.3 (H) 0.3 - 1.2 mg/dL   GFR calc non Af Amer >60 >60 mL/min   GFR calc Af Amer >60 >60 mL/min    Comment: (NOTE) The eGFR has been calculated using the CKD EPI equation. This calculation has not been validated in all clinical situations. eGFR's persistently <60 mL/min signify possible Chronic Kidney Disease.    Anion gap 6 5 - 15  Urinalysis, Routine w reflex microscopic (not at [facility])     Status: Abnormal   Collection Time: 05/25/15  9:59 AM  Result Value Ref Range   Color, Urine ORANGE (A) YELLOW    Comment: BIOCHEMICALS MAY BE AFFECTED BY COLOR   APPearance TURBID (A) CLEAR   Specific Gravity, Urine 1.024 1.005 - 1.030   pH 5.5 5.0 - 8.0   Glucose, UA NEGATIVE NEGATIVE mg/dL   Hgb urine dipstick MODERATE (A) NEGATIVE   Bilirubin Urine NEGATIVE NEGATIVE   Ketones, ur 15 (A) NEGATIVE mg/dL   Protein, ur 30 (A) NEGATIVE mg/dL   Urobilinogen, UA 1.0 0.0 - 1.0 mg/dL   Nitrite NEGATIVE NEGATIVE   Leukocytes, UA LARGE (A) NEGATIVE  Urine microscopic-add on     Status: Abnormal   Collection Time: 05/25/15  9:59 AM  Result Value Ref Range   Squamous Epithelial / LPF RARE  RARE   WBC, UA TOO NUMEROUS TO COUNT <3 WBC/hpf   Bacteria, UA MANY (A) RARE  POC urine preg, ED (not at [facility])     Status: None   Collection Time: 05/25/15 10:47 AM  Result Value Ref Range   Preg Test, Ur NEGATIVE NEGATIVE    Comment:        THE SENSITIVITY OF THIS METHODOLOGY IS >24 mIU/mL   I-Stat CG4 Lactic Acid, ED     Status: Abnormal   Collection Time: 05/25/15  1:22 PM  Result Value Ref Range   Lactic Acid, Venous 0.46 (L) 0.5 - 2.0 mmol/L  CBC     Status: Abnormal   Collection Time: 05/25/15  9:55 PM  Result Value Ref Range   WBC 17.7 (H) 4.0 - 10.5 K/uL   RBC 4.24 3.87 - 5.11 MIL/uL   Hemoglobin 11.7 (L) 12.0 - 15.0 g/dL   HCT 36.3 36.0 - 46.0 %   MCV 85.6 78.0 - 100.0 fL   MCH 27.6 26.0 - 34.0 pg   MCHC 32.2 30.0 - 36.0 g/dL   RDW 13.8 11.5 - 15.5 %   Platelets 199 150 - 400 K/uL  CBC     Status: Abnormal   Collection Time: 05/26/15  5:31 AM  Result Value Ref Range   WBC 12.9 (H) 4.0 - 10.5 K/uL   RBC 3.68 (L) 3.87 - 5.11 MIL/uL   Hemoglobin 10.2 (L) 12.0 - 15.0 g/dL   HCT 31.6 (L) 36.0 - 46.0 %   MCV 85.9 78.0 - 100.0 fL   MCH 27.7 26.0 - 34.0 pg   MCHC 32.3 30.0 - 36.0 g/dL   RDW 13.6 11.5 - 15.5 %   Platelets 172 150 - 400 K/uL  Basic metabolic panel     Status: Abnormal   Collection Time: 05/26/15  5:31 AM  Result Value Ref Range   Sodium 136 135 - 145 mmol/L   Potassium 4.3 3.5 - 5.1 mmol/L   Chloride 106 101 - 111 mmol/L   CO2 24 22 - 32 mmol/L  Glucose, Bld 127 (H) 65 - 99 mg/dL   BUN 7 6 - 20 mg/dL   Creatinine, Ser 0.65 0.44 - 1.00 mg/dL   Calcium 8.2 (L) 8.9 - 10.3 mg/dL   GFR calc non Af Amer >60 >60 mL/min   GFR calc Af Amer >60 >60 mL/min    Comment: (NOTE) The eGFR has been calculated using the CKD EPI equation. This calculation has not been validated in all clinical situations. eGFR's persistently <60 mL/min signify possible Chronic Kidney Disease.    Anion gap 6 5 - 15    Imaging / Studies: Ct Abdomen Pelvis W  Contrast  05/25/2015   CLINICAL DATA:  Epigastric pain for several months, worsening 2 days ago. Nausea. History of GERD and gastritis.  EXAM: CT ABDOMEN AND PELVIS WITH CONTRAST  TECHNIQUE: Multidetector CT imaging of the abdomen and pelvis was performed using the standard protocol following bolus administration of intravenous contrast.  CONTRAST:  167m OMNIPAQUE IOHEXOL 300 MG/ML  SOLN  COMPARISON:  02/27/2015  FINDINGS: Mild dependent atelectasis present in the lung bases.  The liver, gallbladder, spleen, adrenal glands, left kidney, and pancreas have an unremarkable enhanced appearance. A 3 mm low-density lesion in the anterior interpolar right kidney is too small to characterize but most likely represents a cyst.  Oral contrast is present in loops of nondilated small and large bowel to the level of the ascending colon without evidence of obstruction. There is new, extensive inflammation in the right pelvis just superior to the right adnexa. This inflammation extends to the cecum. A normal appendix is not identified, however the inflammation appears greatest where the appendix was seen to be located on the prior CT, and on coronal images there is a tubular low-density structure coursing from the cecum into the inflammation which likely reflects a dilated, inflamed appendix (series 203, image 56). The region of phlegmonous change measures approximately 7.3 x 5.1 cm, and there may be a tiny locule of extraluminal gas. There is wall thickening involving the distal ileum, likely secondary inflammation rather than a primary small bowel process. No well-defined, drainable fluid collection is identified.  Right lower quadrant and aortocaval lymph nodes measure up to 1.3 cm in short axis, likely reactive. The bladder is unremarkable. Uterus and left ovary are grossly unremarkable. Periappendiceal inflammation extends into the right adnexa to involve the right ovary. There is likely trace free fluid in the pelvis. No  intraperitoneal free air is identified. No acute osseous abnormality is identified.  IMPRESSION: Extensive inflammation in the right lower quadrant, most consistent with acute perforated appendicitis with phlegmon formation. No well-defined abscess.  These results were called by telephone at the time of interpretation on 05/25/2015 at 12:22 pm to Dr. YDominic Pea who verbally acknowledged these results.   Electronically Signed   By: ALogan Bores  On: 05/25/2015 12:24    Medications / Allergies:  Scheduled Meds: . cefTRIAXone (ROCEPHIN)  IV  2 g Intravenous Q24H  . enoxaparin (LOVENOX) injection  40 mg Subcutaneous Q24H  . HYDROmorphone PCA 0.3 mg/mL   Intravenous 6 times per day  . metronidazole  500 mg Intravenous Q8H  . pantoprazole (PROTONIX) IV  40 mg Intravenous Q24H   Continuous Infusions: . dextrose 5 % and 0.9% NaCl     PRN Meds:.diphenhydrAMINE **OR** diphenhydrAMINE, HYDROmorphone (DILAUDID) injection, naloxone **AND** sodium chloride, ondansetron **OR** ondansetron (ZOFRAN) IV  Antibiotics: Anti-infectives    Start     Dose/Rate Route Frequency Ordered Stop   05/26/15 0600  cefTRIAXone (ROCEPHIN) 2 g in dextrose 5 % 50 mL IVPB - Premix     2 g 100 mL/hr over 30 Minutes Intravenous Every 24 hours 05/25/15 1956     05/25/15 2100  metroNIDAZOLE (FLAGYL) IVPB 500 mg     500 mg 100 mL/hr over 60 Minutes Intravenous Every 8 hours 05/25/15 1917     05/25/15 1930  cefTRIAXone (ROCEPHIN) 1 g in dextrose 5 % 50 mL IVPB - Premix  Status:  Discontinued     1 g 100 mL/hr over 30 Minutes Intravenous Every 24 hours 05/25/15 1917 05/25/15 1955   05/25/15 1230  cefTRIAXone (ROCEPHIN) 1 g in dextrose 5 % 50 mL IVPB     1 g 100 mL/hr over 30 Minutes Intravenous  Once 05/25/15 1227 05/25/15 1307   05/25/15 1230  metroNIDAZOLE (FLAGYL) IVPB 500 mg     500 mg 100 mL/hr over 60 Minutes Intravenous  Once 05/25/15 1227 05/25/15 1415        Assessment/Plan Acute perforated appendicitis POD#1  laparoscopic converted to open appendectomy---Dr. Georgette Dover  -keep NPO, okay to leave NGT out for now -mobilize, IS -will remove foley tomorrow -VAC change in AM, start approval for VAC at DC -continue drain(192m out) ID-rocephin/flagyl D#1 VTE prophylaxis-SCD/lovenox FEN-c/w IVF, PCA, add IV dilaudid for breakthrough pain Dispo-continue IP    EErby Pian ANP-BC [facility] Pager [phone](7A-4:30P) For consults and floor pages call [phone](7A-4:30P)  05/26/2015 10:12 AM

## 2015-05-27 ENCOUNTER — Ambulatory Visit: Payer: Medicaid Other

## 2015-05-27 LAB — CBC
HCT: 31.7 % — ABNORMAL LOW (ref 36.0–46.0)
Hemoglobin: 10 g/dL — ABNORMAL LOW (ref 12.0–15.0)
MCH: 27.6 pg (ref 26.0–34.0)
MCHC: 31.5 g/dL (ref 30.0–36.0)
MCV: 87.6 fL (ref 78.0–100.0)
Platelets: 190 10*3/uL (ref 150–400)
RBC: 3.62 MIL/uL — ABNORMAL LOW (ref 3.87–5.11)
RDW: 13.8 % (ref 11.5–15.5)
WBC: 9.8 10*3/uL (ref 4.0–10.5)

## 2015-05-27 LAB — BASIC METABOLIC PANEL
Anion gap: 3 — ABNORMAL LOW (ref 5–15)
BUN: 9 mg/dL (ref 6–20)
CO2: 29 mmol/L (ref 22–32)
Calcium: 8.2 mg/dL — ABNORMAL LOW (ref 8.9–10.3)
Chloride: 106 mmol/L (ref 101–111)
Creatinine, Ser: 0.7 mg/dL (ref 0.44–1.00)
GFR calc Af Amer: >60 mL/min (ref >60)
GFR calc non Af Amer: >60 mL/min (ref >60)
Glucose, Bld: 115 mg/dL — ABNORMAL HIGH (ref 65–99)
Potassium: 4 mmol/L (ref 3.5–5.1)
Sodium: 138 mmol/L (ref 135–145)

## 2015-05-27 MED ORDER — KETOROLAC TROMETHAMINE 15 MG/ML IJ SOLN
15.0000 mg | Freq: Once | INTRAMUSCULAR | Status: AC
  Administered 2015-05-27: 15 mg via INTRAVENOUS
  Filled 2015-05-27: qty 1

## 2015-05-27 MED ORDER — KETOROLAC TROMETHAMINE 15 MG/ML IJ SOLN
15.0000 mg | Freq: Four times a day (QID) | INTRAMUSCULAR | Status: DC | PRN
  Administered 2015-05-29 – 2015-05-30 (×4): 15 mg via INTRAVENOUS
  Filled 2015-05-27 (×4): qty 1

## 2015-05-27 NOTE — Progress Notes (Signed)
Patient ID: [name], female   DOB: [birth year], 29 y.o.   MRN: [id number]     [facility]      [address] [place], [place] [address]    Phone: [phone] FAX: [phone]     Subjective: N/v.  Just got nausea medicine.  VSS  Afebrile.  WBC normal today.  Objective:  Vital signs:  Filed Vitals:   05/26/15 2145 05/27/15 0131 05/27/15 0318 05/27/15 0557  BP: 117/73 126/77  143/92  Pulse: 86 82  96  Temp: 98.2 F (36.8 C) 98.1 F (36.7 C)  97.5 F (36.4 C)  TempSrc: Oral     Resp: '16 16 16 16  ' Height:      Weight:      SpO2: 98% 99% 99% 95%    Last BM Date: 05/25/15  Intake/Output   Yesterday:  07/25 0701 - 07/26 0700 In: 2600 [I.V.:2600] Out: 1530 [Urine:1300; Drains:230] This shift:    I/O last 3 completed shifts: In: 2600 [I.V.:2600] Out: 2310 [Urine:2025; Drains:285]    Physical Exam: General: Pt awake/alert/oriented x4 in no acute distress Chest: cta. No chest wall pain w good excursion CV: Pulses intact. Regular rhythm Abdomen: Soft. Nondistended. No bowel sounds. Tender. Midline incision-VAC in place, little serous output. LLQ drain with serosanguinous output. No evidence of peritonitis. No incarcerated hernias. Ext: SCDs BLE. No mjr edema. No cyanosis Skin: No petechiae / purpura  Problem List:   Active Problems:   Acute perforated appendicitis    Results:   Labs: Results for orders placed or performed during the hospital encounter of 05/25/15 (from the past 48 hour(s))  Urinalysis, Routine w reflex microscopic (not at [facility])     Status: Abnormal   Collection Time: 05/25/15  9:59 AM  Result Value Ref Range   Color, Urine ORANGE (A) YELLOW    Comment: BIOCHEMICALS MAY BE AFFECTED BY COLOR   APPearance TURBID (A) CLEAR   Specific Gravity, Urine 1.024 1.005 - 1.030   pH 5.5 5.0 - 8.0   Glucose, UA NEGATIVE NEGATIVE mg/dL   Hgb urine dipstick MODERATE (A) NEGATIVE   Bilirubin Urine NEGATIVE NEGATIVE   Ketones, ur 15 (A) NEGATIVE mg/dL   Protein, ur 30 (A) NEGATIVE mg/dL   Urobilinogen, UA 1.0 0.0 - 1.0 mg/dL   Nitrite NEGATIVE NEGATIVE   Leukocytes, UA LARGE (A) NEGATIVE  Urine microscopic-add on     Status: Abnormal   Collection Time: 05/25/15  9:59 AM  Result Value Ref Range   Squamous Epithelial / LPF RARE RARE   WBC, UA TOO NUMEROUS TO COUNT <3 WBC/hpf   Bacteria, UA MANY (A) RARE  POC urine preg, ED (not at [facility])     Status: None   Collection Time: 05/25/15 10:47 AM  Result Value Ref Range   Preg Test, Ur NEGATIVE NEGATIVE    Comment:        THE SENSITIVITY OF THIS METHODOLOGY IS >24 mIU/mL   I-Stat CG4 Lactic Acid, ED     Status: Abnormal   Collection Time: 05/25/15  1:22 PM  Result Value Ref Range   Lactic Acid, Venous 0.46 (L) 0.5 - 2.0 mmol/L  CBC     Status: Abnormal   Collection Time: 05/25/15  9:55 PM  Result Value Ref Range   WBC 17.7 (H) 4.0 - 10.5 K/uL   RBC 4.24 3.87 - 5.11 MIL/uL   Hemoglobin 11.7 (L) 12.0 - 15.0 g/dL   HCT 36.3 36.0 -  46.0 %   MCV 85.6 78.0 - 100.0 fL   MCH 27.6 26.0 - 34.0 pg   MCHC 32.2 30.0 - 36.0 g/dL   RDW 13.8 11.5 - 15.5 %   Platelets 199 150 - 400 K/uL  CBC     Status: Abnormal   Collection Time: 05/26/15  5:31 AM  Result Value Ref Range   WBC 12.9 (H) 4.0 - 10.5 K/uL   RBC 3.68 (L) 3.87 - 5.11 MIL/uL   Hemoglobin 10.2 (L) 12.0 - 15.0 g/dL   HCT 31.6 (L) 36.0 - 46.0 %   MCV 85.9 78.0 - 100.0 fL   MCH 27.7 26.0 - 34.0 pg   MCHC 32.3 30.0 - 36.0 g/dL   RDW 13.6 11.5 - 15.5 %   Platelets 172 150 - 400 K/uL  Basic metabolic panel     Status: Abnormal   Collection Time: 05/26/15  5:31 AM  Result Value Ref Range   Sodium 136 135 - 145 mmol/L   Potassium 4.3 3.5 - 5.1 mmol/L   Chloride 106 101 - 111 mmol/L   CO2 24 22 - 32 mmol/L   Glucose, Bld 127 (H) 65 - 99 mg/dL   BUN 7 6 - 20 mg/dL   Creatinine, Ser 0.65 0.44 - 1.00 mg/dL   Calcium 8.2 (L) 8.9 - 10.3 mg/dL   GFR calc non Af Amer  >60 >60 mL/min   GFR calc Af Amer >60 >60 mL/min    Comment: (NOTE) The eGFR has been calculated using the CKD EPI equation. This calculation has not been validated in all clinical situations. eGFR's persistently <60 mL/min signify possible Chronic Kidney Disease.    Anion gap 6 5 - 15  CBC     Status: Abnormal   Collection Time: 05/27/15  4:34 AM  Result Value Ref Range   WBC 9.8 4.0 - 10.5 K/uL   RBC 3.62 (L) 3.87 - 5.11 MIL/uL   Hemoglobin 10.0 (L) 12.0 - 15.0 g/dL   HCT 31.7 (L) 36.0 - 46.0 %   MCV 87.6 78.0 - 100.0 fL   MCH 27.6 26.0 - 34.0 pg   MCHC 31.5 30.0 - 36.0 g/dL   RDW 13.8 11.5 - 15.5 %   Platelets 190 150 - 400 K/uL  Basic metabolic panel     Status: Abnormal   Collection Time: 05/27/15  4:34 AM  Result Value Ref Range   Sodium 138 135 - 145 mmol/L   Potassium 4.0 3.5 - 5.1 mmol/L   Chloride 106 101 - 111 mmol/L   CO2 29 22 - 32 mmol/L   Glucose, Bld 115 (H) 65 - 99 mg/dL   BUN 9 6 - 20 mg/dL   Creatinine, Ser 0.70 0.44 - 1.00 mg/dL   Calcium 8.2 (L) 8.9 - 10.3 mg/dL   GFR calc non Af Amer >60 >60 mL/min   GFR calc Af Amer >60 >60 mL/min    Comment: (NOTE) The eGFR has been calculated using the CKD EPI equation. This calculation has not been validated in all clinical situations. eGFR's persistently <60 mL/min signify possible Chronic Kidney Disease.    Anion gap 3 (L) 5 - 15    Imaging / Studies: Ct Abdomen Pelvis W Contrast  05/25/2015   CLINICAL DATA:  Epigastric pain for several months, worsening 2 days ago. Nausea. History of GERD and gastritis.  EXAM: CT ABDOMEN AND PELVIS WITH CONTRAST  TECHNIQUE: Multidetector CT imaging of the abdomen and pelvis was performed using the standard protocol  following bolus administration of intravenous contrast.  CONTRAST:  161m OMNIPAQUE IOHEXOL 300 MG/ML  SOLN  COMPARISON:  02/27/2015  FINDINGS: Mild dependent atelectasis present in the lung bases.  The liver, gallbladder, spleen, adrenal glands, left kidney, and  pancreas have an unremarkable enhanced appearance. A 3 mm low-density lesion in the anterior interpolar right kidney is too small to characterize but most likely represents a cyst.  Oral contrast is present in loops of nondilated small and large bowel to the level of the ascending colon without evidence of obstruction. There is new, extensive inflammation in the right pelvis just superior to the right adnexa. This inflammation extends to the cecum. A normal appendix is not identified, however the inflammation appears greatest where the appendix was seen to be located on the prior CT, and on coronal images there is a tubular low-density structure coursing from the cecum into the inflammation which likely reflects a dilated, inflamed appendix (series 203, image 56). The region of phlegmonous change measures approximately 7.3 x 5.1 cm, and there may be a tiny locule of extraluminal gas. There is wall thickening involving the distal ileum, likely secondary inflammation rather than a primary small bowel process. No well-defined, drainable fluid collection is identified.  Right lower quadrant and aortocaval lymph nodes measure up to 1.3 cm in short axis, likely reactive. The bladder is unremarkable. Uterus and left ovary are grossly unremarkable. Periappendiceal inflammation extends into the right adnexa to involve the right ovary. There is likely trace free fluid in the pelvis. No intraperitoneal free air is identified. No acute osseous abnormality is identified.  IMPRESSION: Extensive inflammation in the right lower quadrant, most consistent with acute perforated appendicitis with phlegmon formation. No well-defined abscess.  These results were called by telephone at the time of interpretation on 05/25/2015 at 12:22 pm to Dr. YDominic Pea who verbally acknowledged these results.   Electronically Signed   By: ALogan Bores  On: 05/25/2015 12:24    Medications / Allergies:  Scheduled Meds: . cefTRIAXone (ROCEPHIN)  IV  2 g  Intravenous Q24H  . enoxaparin (LOVENOX) injection  40 mg Subcutaneous Q24H  . HYDROmorphone PCA 0.3 mg/mL   Intravenous 6 times per day  . metronidazole  500 mg Intravenous Q8H  . pantoprazole (PROTONIX) IV  40 mg Intravenous Q24H   Continuous Infusions: . dextrose 5 % and 0.9% NaCl 125 mL/hr at 05/27/15 0634   PRN Meds:.diphenhydrAMINE **OR** diphenhydrAMINE, HYDROmorphone (DILAUDID) injection, naloxone **AND** sodium chloride, ondansetron **OR** ondansetron (ZOFRAN) IV, promethazine  Antibiotics: Anti-infectives    Start     Dose/Rate Route Frequency Ordered Stop   05/26/15 0600  cefTRIAXone (ROCEPHIN) 2 g in dextrose 5 % 50 mL IVPB - Premix     2 g 100 mL/hr over 30 Minutes Intravenous Every 24 hours 05/25/15 1956     05/25/15 2100  metroNIDAZOLE (FLAGYL) IVPB 500 mg     500 mg 100 mL/hr over 60 Minutes Intravenous Every 8 hours 05/25/15 1917     05/25/15 1930  cefTRIAXone (ROCEPHIN) 1 g in dextrose 5 % 50 mL IVPB - Premix  Status:  Discontinued     1 g 100 mL/hr over 30 Minutes Intravenous Every 24 hours 05/25/15 1917 05/25/15 1955   05/25/15 1230  cefTRIAXone (ROCEPHIN) 1 g in dextrose 5 % 50 mL IVPB     1 g 100 mL/hr over 30 Minutes Intravenous  Once 05/25/15 1227 05/25/15 1307   05/25/15 1230  metroNIDAZOLE (FLAGYL) IVPB 500 mg  500 mg 100 mL/hr over 60 Minutes Intravenous  Once 05/25/15 1227 05/25/15 1415       Assessment/Plan Acute perforated appendicitis POD#2 laparoscopic converted to open appendectomy---Dr. Georgette Dover  -expected post op ileus, NPO, likely needs NGT replaced.  Ill check on her in an hour or so -mobilize, IS -will remove foley today -VAC change today, start approval for VAC at DC -continue drain(162m out serosanguinous) ID-rocephin/flagyl D#2 VTE prophylaxis-SCD/lovenox FEN-c/w IVF, PCA, IV dilaudid for breakthrough pain.  Give toradol.  Dispo-continue IP   EErby Pian [facility] Pager [phone] For  consults and floor pages call [phone](7A-4:30P)  05/27/2015 8:56 AM

## 2015-05-27 NOTE — Progress Notes (Signed)
Order placed for patient to have a NG tube placed if patient continues to have nausea. Per patient, she has not had any nausea since early this morning. Patient informed that a NG would be placed if she continues waves of nausea.

## 2015-05-27 NOTE — Progress Notes (Signed)
1st wound vac change:   Wound is 11.5cm x 4cm x 2cm deep, pink with some sanguinous drainage noted from wound, no induration and no purulent drainage   Jorje Guild, PA-C General Surgery [facility] [phone]

## 2015-05-27 NOTE — Care Management Note (Signed)
Case Management Note  Patient Details  Name: [name] MRN: [id number] Date of Birth: [birth year]  Subjective/Objective:                    Action/Plan: Confirmed face sheet information with patient .   Will send in application for negative pressure wound system as soon as wound measurements received .   Will need order for home health RN to change negative pressure  Monday Wednesday Friday and face to face    Expected Discharge Date:                  Expected Discharge Plan:  Home w Home Health Services  In-House Referral:     Discharge planning Services  CM Consult  Post Acute Care Choice:  Home Health, Durable Medical Equipment Choice offered to:  Patient  DME Arranged:  Negative pressure wound device DME Agency:  [facility]  HH Arranged:  RN [facility] Agency:  [facility]  Status of Service:  In process, will continue to follow  Medicare Important Message Given:    Date Medicare IM Given:    Medicare IM give by:    Date Additional Medicare IM Given:    Additional Medicare Important Message give by:     If discussed at Long Length of Stay Meetings, dates discussed:    Additional Comments:  Kingsley Plan, RN 05/27/2015, 8:34 AM

## 2015-05-28 NOTE — Progress Notes (Signed)
Patient ID: [name], female   DOB: 10/28/1986, 29 y.o.   MRN: [id number] 3 Days Post-Op  Subjective: Pt c/o pain today, but otherwise no nausea.  Some flatus.  Has not ambulated yet  Objective: Vital signs in last 24 hours: Temp:  [97.4 F (36.3 C)-98.8 F (37.1 C)] 98.8 F (37.1 C) (07/27 0531) Pulse Rate:  [78-99] 99 (07/27 0531) Resp:  [10-20] 18 (07/27 0531) BP: (110-133)/(72-87) 124/78 mmHg (07/27 0531) SpO2:  [95 %-98 %] 97 % (07/27 0531) Last BM Date: 05/25/15  Intake/Output from previous day: 07/26 0701 - 07/27 0700 In: 2597.5 [P.O.:180; I.V.:2417.5] Out: 2105 [Urine:1400; Drains:705] Intake/Output this shift:    PE: Abd: soft, obese, appropriately tender, few BS, wound VAC in place, JP drain with serous output Heart: regular Lungs: CTAB  Lab Results:   Recent Labs  05/26/15 0531 05/27/15 0434  WBC 12.9* 9.8  HGB 10.2* 10.0*  HCT 31.6* 31.7*  PLT 172 190   BMET  Recent Labs  05/26/15 0531 05/27/15 0434  NA 136 138  K 4.3 4.0  CL 106 106  CO2 24 29  GLUCOSE 127* 115*  BUN 7 9  CREATININE 0.65 0.70  CALCIUM 8.2* 8.2*   PT/INR No results for input(s): LABPROT, INR in the last 72 hours. CMP     Component Value Date/Time   NA 138 05/27/2015 0434   NA 139 02/27/2015 0035   K 4.0 05/27/2015 0434   K 4.1 02/27/2015 0035   CL 106 05/27/2015 0434   CL 107 02/27/2015 0035   CO2 29 05/27/2015 0434   CO2 26 02/27/2015 0035   GLUCOSE 115* 05/27/2015 0434   GLUCOSE 88 02/27/2015 0035   BUN 9 05/27/2015 0434   BUN 10 02/27/2015 0035   CREATININE 0.70 05/27/2015 0434   CREATININE 0.73 02/27/2015 0035   CALCIUM 8.2* 05/27/2015 0434   CALCIUM 9.0 02/27/2015 0035   PROT 6.7 05/25/2015 0812   PROT 7.3 02/27/2015 0035   ALBUMIN 3.0* 05/25/2015 0812   ALBUMIN 4.0 02/27/2015 0035   AST 13* 05/25/2015 0812   AST 21 02/27/2015 0035   ALT 13* 05/25/2015 0812   ALT 25 02/27/2015 0035   ALKPHOS 58 05/25/2015 0812   ALKPHOS 80 02/27/2015 0035   BILITOT 1.3* 05/25/2015 0812   BILITOT 0.4 02/27/2015 0035   GFRNONAA >60 05/27/2015 0434   GFRNONAA >60 02/27/2015 0035   GFRAA >60 05/27/2015 0434   GFRAA >60 02/27/2015 0035   Lipase     Component Value Date/Time   LIPASE 36 11/26/2013 1508       Studies/Results: No results found.  Anti-infectives: Anti-infectives    Start     Dose/Rate Route Frequency Ordered Stop   05/26/15 0600  cefTRIAXone (ROCEPHIN) 2 g in dextrose 5 % 50 mL IVPB - Premix     2 g 100 mL/hr over 30 Minutes Intravenous Every 24 hours 05/25/15 1956     05/25/15 2100  metroNIDAZOLE (FLAGYL) IVPB 500 mg     500 mg 100 mL/hr over 60 Minutes Intravenous Every 8 hours 05/25/15 1917     05/25/15 1930  cefTRIAXone (ROCEPHIN) 1 g in dextrose 5 % 50 mL IVPB - Premix  Status:  Discontinued     1 g 100 mL/hr over 30 Minutes Intravenous Every 24 hours 05/25/15 1917 05/25/15 1955   05/25/15 1230  cefTRIAXone (ROCEPHIN) 1 g in dextrose 5 % 50 mL IVPB     1 g 100 mL/hr over 30 Minutes Intravenous  Once 05/25/15 1227 05/25/15 1307   05/25/15 1230  metroNIDAZOLE (FLAGYL) IVPB 500 mg     500 mg 100 mL/hr over 60 Minutes Intravenous  Once 05/25/15 1227 05/25/15 1415       Assessment/Plan   Acute perforated appendicitis POD#3 laparoscopic converted to open appendectomy---Dr. Corliss Skains  -she has passed some flatus, try clear liquids -must mobilize today! -VAC change TRS (output increase to 705cc?) -continue drain(27ml out serous) ID-rocephin/flagyl D#3 VTE prophylaxis-SCD/lovenox FEN-c/w IVF, PCA, IV dilaudid for breakthrough pain. Give toradol.  Dispo-continue IP  LOS: 3 days    Karsten Vaughn E 05/28/2015, 8:01 AM Pager: [phone]

## 2015-05-29 MED ORDER — METHOCARBAMOL 500 MG PO TABS
1000.0000 mg | ORAL_TABLET | Freq: Three times a day (TID) | ORAL | Status: DC | PRN
  Administered 2015-05-29 – 2015-06-01 (×7): 1000 mg via ORAL
  Filled 2015-05-29 (×7): qty 2

## 2015-05-29 MED ORDER — OXYCODONE HCL 5 MG PO TABS
5.0000 mg | ORAL_TABLET | ORAL | Status: DC | PRN
  Administered 2015-05-29 – 2015-05-30 (×3): 15 mg via ORAL
  Filled 2015-05-29 (×3): qty 3

## 2015-05-29 MED ORDER — ACETAMINOPHEN 325 MG PO TABS: 325.0000 mg | ORAL_TABLET | Freq: Four times a day (QID) | ORAL | Status: DC | PRN

## 2015-05-29 NOTE — Progress Notes (Signed)
Pt. reports pain remains @ 10 after multiple pain meds given SBP 127

## 2015-05-29 NOTE — Progress Notes (Signed)
[facility] Progress Note  4 Days Post-Op  Subjective: Pt notes continued pain out of proportion to her exam.  No N/V, tolerating some clears.  No flatus yet, no BM yet.  Ambulating up to the commode.     Objective: Vital signs in last 24 hours: Temp:  [98.1 F (36.7 C)-99 F (37.2 C)] 98.1 F (36.7 C) (07/28 0539) Pulse Rate:  [88-100] 88 (07/28 0539) Resp:  [11-18] 13 (07/28 0539) BP: (119-134)/(77-86) 119/80 mmHg (07/28 0539) SpO2:  [98 %-100 %] 99 % (07/28 0539) Last BM Date: 05/25/15  Intake/Output from previous day: 07/27 0701 - 07/28 0700 In: 3892.1 [P.O.:720; I.V.:1872.1; IV Piggyback:1300] Out: 2010 [Urine:1700; Drains:310] Intake/Output this shift:    PE: Gen:  Alert, NAD, pleasant Abd: Soft, noted to be minimally tender, ND, +BS, no HSM, midline wound with wound vac in place, sanguinous drainage from wound vac, bulb drain with serosanguinous drainage   Lab Results:   Recent Labs  05/27/15 0434  WBC 9.8  HGB 10.0*  HCT 31.7*  PLT 190   BMET  Recent Labs  05/27/15 0434  NA 138  K 4.0  CL 106  CO2 29  GLUCOSE 115*  BUN 9  CREATININE 0.70  CALCIUM 8.2*   PT/INR No results for input(s): LABPROT, INR in the last 72 hours. CMP     Component Value Date/Time   NA 138 05/27/2015 0434   NA 139 02/27/2015 0035   K 4.0 05/27/2015 0434   K 4.1 02/27/2015 0035   CL 106 05/27/2015 0434   CL 107 02/27/2015 0035   CO2 29 05/27/2015 0434   CO2 26 02/27/2015 0035   GLUCOSE 115* 05/27/2015 0434   GLUCOSE 88 02/27/2015 0035   BUN 9 05/27/2015 0434   BUN 10 02/27/2015 0035   CREATININE 0.70 05/27/2015 0434   CREATININE 0.73 02/27/2015 0035   CALCIUM 8.2* 05/27/2015 0434   CALCIUM 9.0 02/27/2015 0035   PROT 6.7 05/25/2015 0812   PROT 7.3 02/27/2015 0035   ALBUMIN 3.0* 05/25/2015 0812   ALBUMIN 4.0 02/27/2015 0035   AST 13* 05/25/2015 0812   AST 21 02/27/2015 0035   ALT 13* 05/25/2015 0812   ALT 25 02/27/2015 0035   ALKPHOS 58  05/25/2015 0812   ALKPHOS 80 02/27/2015 0035   BILITOT 1.3* 05/25/2015 0812   BILITOT 0.4 02/27/2015 0035   GFRNONAA >60 05/27/2015 0434   GFRNONAA >60 02/27/2015 0035   GFRAA >60 05/27/2015 0434   GFRAA >60 02/27/2015 0035   Lipase     Component Value Date/Time   LIPASE 36 11/26/2013 1508       Studies/Results: No results found.  Anti-infectives: Anti-infectives    Start     Dose/Rate Route Frequency Ordered Stop   05/26/15 0600  cefTRIAXone (ROCEPHIN) 2 g in dextrose 5 % 50 mL IVPB - Premix     2 g 100 mL/hr over 30 Minutes Intravenous Every 24 hours 05/25/15 1956     05/25/15 2100  metroNIDAZOLE (FLAGYL) IVPB 500 mg     500 mg 100 mL/hr over 60 Minutes Intravenous Every 8 hours 05/25/15 1917     05/25/15 1930  cefTRIAXone (ROCEPHIN) 1 g in dextrose 5 % 50 mL IVPB - Premix  Status:  Discontinued     1 g 100 mL/hr over 30 Minutes Intravenous Every 24 hours 05/25/15 1917 05/25/15 1955   05/25/15 1230  cefTRIAXone (ROCEPHIN) 1 g in dextrose 5 % 50 mL IVPB     1 g  100 mL/hr over 30 Minutes Intravenous  Once 05/25/15 1227 05/25/15 1307   05/25/15 1230  metroNIDAZOLE (FLAGYL) IVPB 500 mg     500 mg 100 mL/hr over 60 Minutes Intravenous  Once 05/25/15 1227 05/25/15 1415       Assessment/Plan Acute perforated appendicitis POD#4 laparoscopic converted to open appendectomy---Dr. Corliss Skains  -Continue clear liquids until she passes better flatus -Must mobilize today! -VAC change TRS (output 50mL wound vac, sanguinous) -Continue drain ( out serosang.) -Rocking chair to help pass flatus -Will ask PT to aid her in getting up ID-rocephin/flagyl Day #4 VTE prophylaxis-SCD/lovenox FEN-c/w IVF, d/c PCA, start robaxin, Oxy IR, tylenol, toradol, ice, IV dilaudid for breakthrough pain only. Dispo-continue IP, await diet advancement    LOS: 4 days    Nonie Hoyer 05/29/2015, 7:51 AM Pager: [phone]

## 2015-05-30 LAB — BASIC METABOLIC PANEL
Anion gap: 5 (ref 5–15)
BUN: <5 mg/dL — ABNORMAL LOW (ref 6–20)
CO2: 29 mmol/L (ref 22–32)
Calcium: 8.8 mg/dL — ABNORMAL LOW (ref 8.9–10.3)
Chloride: 101 mmol/L (ref 101–111)
Creatinine, Ser: 0.64 mg/dL (ref 0.44–1.00)
GFR calc Af Amer: >60 mL/min (ref >60)
GFR calc non Af Amer: >60 mL/min (ref >60)
Glucose, Bld: 85 mg/dL (ref 65–99)
Potassium: 4.2 mmol/L (ref 3.5–5.1)
Sodium: 135 mmol/L (ref 135–145)

## 2015-05-30 LAB — CBC
HCT: 34.4 % — ABNORMAL LOW (ref 36.0–46.0)
Hemoglobin: 11.1 g/dL — ABNORMAL LOW (ref 12.0–15.0)
MCH: 27.4 pg (ref 26.0–34.0)
MCHC: 32.3 g/dL (ref 30.0–36.0)
MCV: 84.9 fL (ref 78.0–100.0)
Platelets: 249 10*3/uL (ref 150–400)
RBC: 4.05 MIL/uL (ref 3.87–5.11)
RDW: 13.6 % (ref 11.5–15.5)
WBC: 7.5 10*3/uL (ref 4.0–10.5)

## 2015-05-30 MED ORDER — OXYCODONE HCL 5 MG PO TABS
10.0000 mg | ORAL_TABLET | ORAL | Status: DC | PRN
  Administered 2015-05-30 – 2015-06-01 (×11): 20 mg via ORAL
  Filled 2015-05-30 (×11): qty 4

## 2015-05-30 MED ORDER — SENNOSIDES-DOCUSATE SODIUM 8.6-50 MG PO TABS
1.0000 | ORAL_TABLET | Freq: Two times a day (BID) | ORAL | Status: DC
  Administered 2015-05-30 – 2015-06-01 (×5): 1 via ORAL
  Filled 2015-05-30 (×5): qty 1

## 2015-05-30 MED ORDER — IBUPROFEN 600 MG PO TABS
600.0000 mg | ORAL_TABLET | Freq: Three times a day (TID) | ORAL | Status: DC
  Administered 2015-05-30 – 2015-06-01 (×8): 600 mg via ORAL
  Filled 2015-05-30 (×4): qty 1
  Filled 2015-05-30: qty 3
  Filled 2015-05-30 (×3): qty 1

## 2015-05-30 MED ORDER — HYDROMORPHONE HCL 1 MG/ML IJ SOLN
1.0000 mg | INTRAMUSCULAR | Status: DC | PRN
  Administered 2015-05-30 – 2015-05-31 (×4): 1 mg via INTRAVENOUS
  Filled 2015-05-30 (×4): qty 1

## 2015-05-30 NOTE — Discharge Instructions (Signed)

## 2015-05-30 NOTE — Progress Notes (Signed)
Patient ID: [name], female   DOB: Jan 25, 1986, 29 y.o.   MRN: [id number]     [facility]      [address] [place], [place] [address]    Phone: [phone] FAX: [phone]     Subjective: No flatus.  No nausea.  Tolerating fulls. Asking for diladid.  i explained to her that we are working towards discharge.  She appears comfortable without any objective signs of discomfort.  She admits to taking oxy 20m q4h for foot pain [place] the [place] controlled substance database.  The patient is afebrile. No tachycardia or hypotension.  White count is normal.    Drain mostly serous output.    Objective:  Vital signs:  Filed Vitals:   05/29/15 0539 05/29/15 1400 05/29/15 2120 05/30/15 0533  BP: 119/80 117/75 127/82 120/83  Pulse: 88 89 94 91  Temp: 98.1 F (36.7 C) 97.7 F (36.5 C) 98.4 F (36.9 C) 98.2 F (36.8 C)  TempSrc: Oral Oral Oral Oral  Resp: '13 18 16 18  ' Height:      Weight:      SpO2: 99% 100% 100% 97%    Last BM Date: 05/25/15  Intake/Output   Yesterday:  07/28 0701 - 07/29 0700 In: 2161 [P.O.:840; I.V.:1321] Out: 1580 [Urine:1500; Drains:80] This shift:    I/O last 3 completed shifts: In: 37035[P.O.:1080; I.V.:2391; IV Piggyback:250] Out: 3090 [Urine:2900; Drains:190]   Physical Exam: General: Pt awake/alert/oriented x4 in no acute distress Abdomen: Soft.  Nondistended.   Mildly tender at incisions only. Midline wound--VAC serosang output.  Drain serosang.   No evidence of peritonitis.  No incarcerated hernias.   Problem List:   Active Problems:   Acute perforated appendicitis    Results:   Labs: Results for orders placed or performed during the hospital encounter of 05/25/15 (from the past 48 hour(s))  CBC     Status: Abnormal   Collection Time: 05/30/15  3:40 AM  Result Value Ref Range   WBC 7.5 4.0 -  10.5 K/uL   RBC 4.05 3.87 - 5.11 MIL/uL   Hemoglobin 11.1 (L) 12.0 - 15.0 g/dL   HCT 34.4 (L) 36.0 - 46.0 %   MCV 84.9 78.0 - 100.0 fL   MCH 27.4 26.0 - 34.0 pg   MCHC 32.3 30.0 - 36.0 g/dL   RDW 13.6 11.5 - 15.5 %   Platelets 249 150 - 400 K/uL  Basic metabolic panel     Status: Abnormal   Collection Time: 05/30/15  3:40 AM  Result Value Ref Range   Sodium 135 135 - 145 mmol/L   Potassium 4.2 3.5 - 5.1 mmol/L   Chloride 101 101 - 111 mmol/L   CO2 29 22 - 32 mmol/L   Glucose, Bld 85 65 - 99 mg/dL   BUN <5 (L) 6 - 20 mg/dL   Creatinine, Ser 0.64 0.44 - 1.00 mg/dL   Calcium 8.8 (L) 8.9 - 10.3 mg/dL   GFR calc non Af Amer >60 >60 mL/min   GFR calc Af Amer >60 >60 mL/min    Comment: (NOTE) The eGFR has been calculated using the CKD EPI equation. This calculation has not been validated in all clinical situations. eGFR's persistently <60 mL/min signify possible Chronic Kidney Disease.    Anion gap 5 5 - 15    Imaging / Studies: No results found.  Medications / Allergies:  Scheduled Meds: . cefTRIAXone (ROCEPHIN)  IV  2 g Intravenous Q24H  . enoxaparin (LOVENOX) injection  40 mg Subcutaneous Q24H  . ibuprofen  600 mg Oral TID  . metronidazole  500 mg Intravenous Q8H  . pantoprazole (PROTONIX) IV  40 mg Intravenous Q24H   Continuous Infusions: . dextrose 5 % and 0.9% NaCl 50 mL/hr at 05/29/15 0853   PRN Meds:.acetaminophen, HYDROmorphone (DILAUDID) injection, methocarbamol, ondansetron **OR** ondansetron (ZOFRAN) IV, oxyCODONE, promethazine  Antibiotics: Anti-infectives    Start     Dose/Rate Route Frequency Ordered Stop   05/26/15 0600  cefTRIAXone (ROCEPHIN) 2 g in dextrose 5 % 50 mL IVPB - Premix     2 g 100 mL/hr over 30 Minutes Intravenous Every 24 hours 05/25/15 1956     05/25/15 2100  metroNIDAZOLE (FLAGYL) IVPB 500 mg     500 mg 100 mL/hr over 60 Minutes Intravenous Every 8 hours 05/25/15 1917     05/25/15 1930  cefTRIAXone (ROCEPHIN) 1 g in dextrose 5 % 50 mL  IVPB - Premix  Status:  Discontinued     1 g 100 mL/hr over 30 Minutes Intravenous Every 24 hours 05/25/15 1917 05/25/15 1955   05/25/15 1230  cefTRIAXone (ROCEPHIN) 1 g in dextrose 5 % 50 mL IVPB     1 g 100 mL/hr over 30 Minutes Intravenous  Once 05/25/15 1227 05/25/15 1307   05/25/15 1230  metroNIDAZOLE (FLAGYL) IVPB 500 mg     500 mg 100 mL/hr over 60 Minutes Intravenous  Once 05/25/15 1227 05/25/15 1415        Assessment/Plan Acute perforated appendicitis POD#5 laparoscopic converted to open appendectomy---Dr. Georgette Dover  -mobilize  -VAC change TRS, check with CM for pending approval.  If not approved, DC with wet to dry and HH -Continue drain (74m out serosang.) -PT eval ID-rocephin/flagyl Day #5/7 VTE prophylaxis-SCD/lovenox FEN-c/w IVF, fulls until bowel function returns.  Add Senna.  Start scheduled ibuprofen, increase oxy IR.  Unfortunately, is going to be difficult to control her pain due to being in oxy 172mq4h at home.  Dispo-continue IP, await diet advancement   EmErby PianANP-BC [facility] Pager [phone](7A-4:30P) For consults and floor pages call [phone](7A-4:30P)  05/30/2015 8:50 AM

## 2015-05-30 NOTE — Care Management Note (Signed)
Case Management Note  Patient Details  Name: [name] MRN: [id number] Date of Birth: 1986/06/23  Subjective/Objective:                    Action/Plan: On day of discharge nurse can remove [facility] send patient home with wet to dry and [facility] can apply their negative pressure wound system at patient's home , per Barnetta Chapel PA .  Ronny Flurry RN BSN   Expected Discharge Date:                  Expected Discharge Plan:  Home w Home Health Services  In-House Referral:     Discharge planning Services  CM Consult  Post Acute Care Choice:  Home Health, Durable Medical Equipment Choice offered to:  Patient  DME Arranged:  Negative pressure wound device DME Agency:  [facility]  HH Arranged:  RN [facility] Agency:  [facility]  Status of Service:  In process, will continue to follow  Medicare Important Message Given:    Date Medicare IM Given:    Medicare IM give by:    Date Additional Medicare IM Given:    Additional Medicare Important Message give by:     If discussed at Long Length of Stay Meetings, dates discussed:    Additional Comments:  Kingsley Plan, RN 05/30/2015, 10:55 AM

## 2015-05-30 NOTE — Progress Notes (Signed)
Pt. tearful when given Oxycodone rather than Dilaudid, "They don't work". Encouraged to try q4h as ord; reported to next shift nurse.

## 2015-05-30 NOTE — Evaluation (Signed)
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 She has had nausea but no vomiting. No previous abdominal surgery..  she had laproscopic - progressed to open  appendectomy on 05-25-15.  pt with elevated pain and decreasd mobilty folowing her surgery.  she has history of left foot surgery from accident and still has pain from this  Clinical Impression  Pt agreed to walk only after receiving pain meds. Pt reports her pain at 8/10.  Pt did well - walked 180 feet with min guard only.  She did loose her balance once but recovered.  Encouraged her to continue her walking with help only.  I dont anticipate any equipment or therapy needs once she is discharged.  Will follow her in the hospital    Follow Up Recommendations No PT follow up;Supervision - Intermittent    Equipment Recommendations  None recommended by PT    Recommendations for Other Services       Precautions / Restrictions Precautions Precautions: Fall Restrictions Weight Bearing Restrictions: No      Mobility  Bed Mobility Overal bed mobility: Modified Independent             General bed mobility comments: pt got OOB with HOB raised  Transfers Overall transfer level: Needs assistance   Transfers: Sit to/from Stand;Stand Pivot Transfers Sit to Stand: Min guard Stand pivot transfers: Min guard          Ambulation/Gait Ambulation/Gait assistance: Min guard Ambulation Distance (Feet): 180 Feet Assistive device: 1 person hand held assist Gait Pattern/deviations: WFL(Within Functional Limits)     General Gait Details: pt did loose her balance once in the room when turning after washing her hands. she says this happens to her sometimes.   Encouraged her to get up with help only but to increase her time OOB and increas her walking with help  Stairs            Wheelchair Mobility    Modified Rankin (Stroke Patients Only)       Balance                                             Pertinent Vitals/Pain Pain Assessment: 0-10 Pain Score: 8  Pain Intervention(s): Premedicated before session;Repositioned    Home Living Family/patient expects to be discharged to:: Private residence Living Arrangements: Parent Available Help at Discharge: Family                  Prior Function Level of Independence: Independent               Hand Dominance        Extremity/Trunk Assessment   Upper Extremity Assessment: Overall WFL for tasks assessed           Lower Extremity Assessment: Overall WFL for tasks assessed      Cervical / Trunk Assessment: Normal  Communication   Communication: No difficulties  Cognition Arousal/Alertness: Awake/alert Behavior During Therapy: Flat affect Overall Cognitive Status: Within Functional Limits for tasks assessed (pt verbalized her frustration over entire event and she wished this never happened to her)  General Comments General comments (skin integrity, edema, etc.): pt has  VAC intact and JP drain intact    Exercises        Assessment/Plan    PT Assessment Patient needs continued PT services  PT Diagnosis Generalized weakness;Acute pain   PT Problem List Decreased activity tolerance;Decreased safety awareness;Pain  PT Treatment Interventions Gait training;Functional mobility training;Patient/family education   PT Goals (Current goals can be found in the Care Plan section) Acute Rehab PT Goals Patient Stated Goal: to heal up and stop hurting PT Goal Formulation: With patient Time For Goal Achievement: 06/06/15 Potential to Achieve Goals: Good    Frequency Min 3X/week   Barriers to discharge         Co-evaluation               End of Session   Activity Tolerance: Patient tolerated treatment well (but needed encouragement to participate) Patient left: in chair;with call bell/phone within reach Nurse Communication: Mobility status         Time: 1425-1500 PT Time Calculation (min) (ACUTE ONLY): 35 min   Charges:   PT Evaluation $Initial PT Evaluation Tier I: 1 Procedure PT Treatments $Gait Training: 8-22 mins   PT G Codes:        Judson Roch 05/30/2015, 3:07 PM 05/30/2015   Ranae Palms, PT

## 2015-05-31 MED ORDER — PANTOPRAZOLE SODIUM 40 MG PO TBEC
40.0000 mg | DELAYED_RELEASE_TABLET | Freq: Every day | ORAL | Status: DC
  Administered 2015-05-31 – 2015-06-01 (×2): 40 mg via ORAL
  Filled 2015-05-31 (×2): qty 1

## 2015-05-31 MED ORDER — POLYETHYLENE GLYCOL 3350 17 G PO PACK
17.0000 g | PACK | Freq: Two times a day (BID) | ORAL | Status: AC
  Administered 2015-05-31 – 2015-06-01 (×3): 17 g via ORAL
  Filled 2015-05-31 (×3): qty 1

## 2015-05-31 NOTE — Progress Notes (Signed)
6 Days Post-Op  Subjective: Stable and alert Constantly asking for pain medication.  We had a long talk about outpatient pain management and how that her pain management physician in [place] would be the single doctor to manage narcotic pain medication according to her contract.  She understands and agrees with this. We will discontinued dilaudid and continue OxyIR.  She is tolerating clear liquids.  Has not had a bowel movement in 7 days.  She does not feel nauseated or bloated. Advanced to heart healthy diet this morning.  Case management states that she will need to go home on wet-to-dry dressings.  I discussed this with the patient.  We will discontinue the VAC today and start wet-to-dry dressings.  Objective: Vital signs in last 24 hours: Temp:  [98.1 F (36.7 C)-98.3 F (36.8 C)] 98.3 F (36.8 C) (07/30 0616) Pulse Rate:  [82-87] 87 (07/30 0616) Resp:  [18-19] 19 (07/30 0616) BP: (110-121)/(62-80) 110/62 mmHg (07/30 0616) SpO2:  [96 %-99 %] 98 % (07/30 0616) Last BM Date: 05/24/15  Intake/Output from previous day: 07/29 0701 - 07/30 0700 In: 2224.5 [P.O.:480; I.V.:1094.5; IV Piggyback:650] Out: 100 [Drains:100] Intake/Output this shift:    General appearance: Alert.  Appropriate but markedly flattened and depressed affect.  Tearful intermittently when discussing discharge. Resp: clear to auscultation bilaterally GI: Obese.  Soft.  Minimally tender.  Back wound with clear drainage.  Left lower quadrant JP drain with serosanguineous drainage.  Lab Results:  No results found for this or any previous visit (from the past 24 hour(s)).   Studies/Results: No results found.  . cefTRIAXone (ROCEPHIN)  IV  2 g Intravenous Q24H  . enoxaparin (LOVENOX) injection  40 mg Subcutaneous Q24H  . ibuprofen  600 mg Oral TID  . metronidazole  500 mg Intravenous Q8H  . pantoprazole (PROTONIX) IV  40 mg Intravenous Q24H  . polyethylene glycol  17 g Oral BID  . senna-docusate  1  tablet Oral BID     Assessment/Plan: s/p Procedure(s): APPENDECTOMY  Acute perforated appendicitis POD#6 laparoscopic converted to open appendectomy---Dr. Corliss [name]  -mobilize  , DC with wet to dry and HH -Continue drain (80mL out serosang.) -PT eval completed.  No home health or ongoing PT needs.  ID-rocephin/flagyl Day #6/7 VTE prophylaxis-SCD/lovenox FEN-advanced to heart healthy diet.  Mira lax twice a day. Add Senna. Start scheduled ibuprofen, increase oxy IR. Unfortunately, may be difficult to control her pain due to being in oxy  q4h at home.  I have discontinued her Dilaudid and I have informed her of this. Outpatient pain management per her pain management physician, Dr. Phineas [name].  Dispo-continue IP, advance diet.  I told her she could go home this afternoon or tomorrow.  Await bowel function.  @  LOS: 6 days    [name] 05/31/2015  . .prob

## 2015-05-31 NOTE — Progress Notes (Signed)
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 She has had nausea but no vomiting. No previous abdominal surgery..  she had laproscopic - progressed to open  appendectomy on 05-25-15.  pt with elevated pain and decreasd mobilty folowing her surgery.  she has history of left foot surgery from accident and still has pain from this    PT Comments    Patient teary-eyed today regarding how some staff has treated her related to pain medication requests.  She reports prior pain management for Lt foot secondary to fx with surgical repair. She reports good family support although she is primarily responsible for 3 children at home (2yo nephew, 40 yo niece, and her 6yo son).  Patient able to walk on unit independently. Unable to assess stairs secondary to wound vac and IV.  Will assess prior to d/c home as able.  Follow Up Recommendations  No PT follow up;Supervision - Intermittent     Equipment Recommendations  None recommended by PT    Recommendations for Other Services       Precautions / Restrictions Precautions Precautions: None Precaution Comments: wound vac, JP drain Restrictions Weight Bearing Restrictions: No    Mobility  Bed Mobility Overal bed mobility: Modified Independent             General bed mobility comments: in/out of bed with raised HOB independent  Transfers Overall transfer level: Modified independent Equipment used: None Transfers: Sit to/from Stand Sit to Stand: Supervision (secondary to equipment/tripping hazzards in room)            Ambulation/Gait Ambulation/Gait assistance: Modified independent (Device/Increase time) Ambulation Distance (Feet): 350 Feet Assistive  device: None Gait Pattern/deviations: WFL(Within Functional Limits) (decr trunk rot, pts hold abdomen while walking)         Stairs Stairs:  (unable to assess secondary to IV and wound vac)          Wheelchair Mobility    Modified Rankin (Stroke Patients Only)       Balance Overall balance assessment: Independent                                  Cognition Arousal/Alertness: Awake/alert Behavior During Therapy: Flat affect (teary-eyed) Overall Cognitive Status: Within Functional Limits for tasks assessed                      Exercises      General Comments        Pertinent Vitals/Pain Pain Assessment: 0-10 Pain Score: 6  Pain Location: abdomen Pain Descriptors / Indicators: Constant;Cramping;Aching;Sore Pain Intervention(s): Limited activity within patient's tolerance;Monitored during session    Home Living                      Prior Function            PT Goals (current goals can now be found in the care plan section) Acute Rehab PT Goals Patient Stated Goal: get to feeling better PT Goal Formulation: With patient Time For Goal Achievement: 06/06/15 Potential to Achieve Goals: Good Progress towards PT goals: Progressing toward goals    Frequency  Min 3X/week  PT Plan Current plan remains appropriate    Co-evaluation             End of Session   Activity Tolerance: Patient tolerated treatment well Patient left: in bed;with call bell/phone within reach;with nursing/sitter in room     Time: 1445-1510 PT Time Calculation (min) (ACUTE ONLY): 25 min  Charges:  $Gait Training: 23-37 mins                    G CodesNestor [name], [place] [phone]  [name] 05/31/2015, 3:18 PM

## 2015-06-01 LAB — CREATININE, SERUM
Creatinine, Ser: 0.72 mg/dL (ref 0.44–1.00)
GFR calc Af Amer: >60 mL/min (ref >60)
GFR calc non Af Amer: >60 mL/min (ref >60)

## 2015-06-01 MED ORDER — POLYETHYLENE GLYCOL 3350 17 G PO PACK: 17.0000 g | PACK | Freq: Two times a day (BID) | ORAL | Status: DC

## 2015-06-01 MED ORDER — METHOCARBAMOL 500 MG PO TABS: 1000.0000 mg | ORAL_TABLET | Freq: Three times a day (TID) | ORAL | Status: DC | PRN

## 2015-06-01 MED ORDER — SENNOSIDES-DOCUSATE SODIUM 8.6-50 MG PO TABS: 1.0000 | ORAL_TABLET | Freq: Two times a day (BID) | ORAL | Status: DC

## 2015-06-01 MED ORDER — ACETAMINOPHEN 325 MG PO TABS: 325.0000 mg | ORAL_TABLET | Freq: Four times a day (QID) | ORAL | Status: DC | PRN

## 2015-06-01 NOTE — Discharge Summary (Signed)
Physician Discharge Summary  [name] FAO:[id number] DOB: [birth year] DOA: 05/25/2015  PCP: Imelda Pillow, NP  Consultation: none  Admit date: 05/25/2015 Discharge date: 06/01/2015  Recommendations for Outpatient Follow-up:   Follow-up Information    Follow up with TSUEI,MATTHEW K., MD. Schedule an appointment as soon as possible for a visit in 2 weeks.   Specialty:  General Surgery   Contact information:   [address] [place] [address] [phone]      Discharge Diagnoses:  1. Perforated appendicitis   Surgical Procedure: laparoscopic converted to open appendectomy---Dr. Corliss Skains   Discharge Condition: stable Disposition: home  Diet recommendation: regular  Filed Weights   05/25/15 0629 05/25/15 2010  Weight: 104.979 kg (231 lb 7 oz) 112.356 kg (247 lb 11.2 oz)     Filed Vitals:   06/01/15 0515  BP: 114/70  Pulse: 88  Temp: 98.1 F (36.7 C)  Resp: 16    Hospital Course:  [name] is a 29 year old female with history of chronic foot pain and on chronic opioids who presented with lower abdominal pain.   CT of abdomen showed significant inflammation in the RLQ and a 7cm phlegmon.  She underwent the procedure listed above and tolerated well.  She was transferred to the floor.  She was kept on IV antibiotics for a total of 7 days.  Diet was advanced as ileus resolved.  We had a rather difficult time controlling her pain with her history.  She was mobilized with therapies and did not recommend any further follow up.  She remained stable.  WBC normalized and fevers resolved.  On POD#7 the patient was having BMs, VSS, afebrile.  She was therefore felt stable for discharge.  JP drain was removed-serous output.  VAC was removed and changed to wet to drys.  Home health will come to her home and place a VAC.  Medication risks, benefits and therapeutic alternatives were reviewed with the patient.  She verbalizes understanding. She will get in contact  with her pain doctor for pain management.  She was not provided with an Rx.  She has a pain contract.   Physical Exam: General: Pt awake/alert/oriented x4 in no acute distress Abdomen: Soft. Nondistended. Mildly tender at incisions only. Midline wound--VAC serosang output. Drain serous output---removed. No evidence of peritonitis. No incarcerated hernias.   Discharge Instructions     Medication List    STOP taking these medications        dicyclomine 20 MG tablet  Commonly known as:  BENTYL     predniSONE 10 MG tablet  Commonly known as:  DELTASONE      TAKE these medications        acetaminophen 325 MG tablet  Commonly known as:  TYLENOL  Take 1-2 tablets (325-650 mg total) by mouth every 6 (six) hours as needed for fever, headache, mild pain or moderate pain.     fexofenadine 180 MG tablet  Commonly known as:  ALLEGRA  Take 180 mg by mouth daily.     ibuprofen 600 MG tablet  Commonly known as:  ADVIL,MOTRIN  Take 1 tablet (600 mg total) by mouth every 6 (six) hours as needed.     methocarbamol 500 MG tablet  Commonly known as:  ROBAXIN  Take 2 tablets (1,000 mg total) by mouth every 8 (eight) hours as needed for muscle spasms (or pain).     omeprazole 20 MG capsule  Commonly known as:  PRILOSEC  Take 20 mg  by mouth daily.     ondansetron 4 MG disintegrating tablet  Commonly known as:  ZOFRAN ODT  Take 1 tablet (4 mg total) by mouth every 8 (eight) hours as needed for nausea or vomiting.     oxyCODONE 15 MG immediate release tablet  Commonly known as:  ROXICODONE  Take 15 mg by mouth daily as needed for pain.     polyethylene glycol packet  Commonly known as:  MIRALAX / GLYCOLAX  Take 17 g by mouth 2 (two) times daily.     promethazine 12.5 MG tablet  Commonly known as:  PHENERGAN  Take 12.5 mg by mouth daily as needed.     senna-docusate 8.6-50 MG per tablet  Commonly known as:  Senokot-S  Take 1 tablet by mouth 2 (two) times daily.      sertraline 100 MG tablet  Commonly known as:  ZOLOFT  Take 100 mg by mouth daily.           Follow-up Information    Follow up with Wynona Luna., MD. Schedule an appointment as soon as possible for a visit in 2 weeks.   Specialty:  General Surgery   Contact information:   [address] [place] [address] [phone]        The results of significant diagnostics from this hospitalization (including imaging, microbiology, ancillary and laboratory) are listed below for reference.    Significant Diagnostic Studies: Dg Chest 2 View  05/09/2015   CLINICAL DATA:  Left-sided chest pain for 3 weeks.  EXAM: CHEST  2 VIEW  COMPARISON:  April 13, 2014.  FINDINGS: The heart size and mediastinal contours are within normal limits. Both lungs are clear. No pneumothorax or pleural effusion is noted. The visualized skeletal structures are unremarkable.  IMPRESSION: No active cardiopulmonary disease.   Electronically Signed   By: Lupita Raider, M.D.   On: 05/09/2015 20:31   Dg Abd 1 View  05/09/2015   CLINICAL DATA:  Abdominal pain, constipation and cramping for 4 days.  EXAM: ABDOMEN - 1 VIEW  COMPARISON:  12/09/2010 radiographs  FINDINGS: The bowel gas pattern is normal. No radio-opaque calculi or other significant radiographic abnormality are seen.  IMPRESSION: Negative.   Electronically Signed   By: Harmon Pier M.D.   On: 05/09/2015 21:16   Ct Abdomen Pelvis W Contrast  05/25/2015   CLINICAL DATA:  Epigastric pain for several months, worsening 2 days ago. Nausea. History of GERD and gastritis.  EXAM: CT ABDOMEN AND PELVIS WITH CONTRAST  TECHNIQUE: Multidetector CT imaging of the abdomen and pelvis was performed using the standard protocol following bolus administration of intravenous contrast.  CONTRAST:  OMNIPAQUE IOHEXOL 300 MG/ML  SOLN  COMPARISON:  02/27/2015  FINDINGS: Mild dependent atelectasis present in the lung bases.  The liver, gallbladder, spleen, adrenal glands,  left kidney, and pancreas have an unremarkable enhanced appearance. A 3 mm low-density lesion in the anterior interpolar right kidney is too small to characterize but most likely represents a cyst.  Oral contrast is present in loops of nondilated small and large bowel to the level of the ascending colon without evidence of obstruction. There is new, extensive inflammation in the right pelvis just superior to the right adnexa. This inflammation extends to the cecum. A normal appendix is not identified, however the inflammation appears greatest where the appendix was seen to be located on the prior CT, and on coronal images there is a tubular low-density structure coursing from  the cecum into the inflammation which likely reflects a dilated, inflamed appendix (series 203, image 56). The region of phlegmonous change measures approximately 7.3 x 5.1 cm, and there may be a tiny locule of extraluminal gas. There is wall thickening involving the distal ileum, likely secondary inflammation rather than a primary small bowel process. No well-defined, drainable fluid collection is identified.  Right lower quadrant and aortocaval lymph nodes measure up to 1.3 cm in short axis, likely reactive. The bladder is unremarkable. Uterus and left ovary are grossly unremarkable. Periappendiceal inflammation extends into the right adnexa to involve the right ovary. There is likely trace free fluid in the pelvis. No intraperitoneal free air is identified. No acute osseous abnormality is identified.  IMPRESSION: Extensive inflammation in the right lower quadrant, most consistent with acute perforated appendicitis with phlegmon formation. No well-defined abscess.  These results were called by telephone at the time of interpretation on 05/25/2015 at 12:22 pm to Dr. Donato Heinz, who verbally acknowledged these results.   Electronically Signed   By: Sebastian Ache   On: 05/25/2015 12:24    Microbiology: No results found for this or any previous visit  (from the past 240 hour(s)).   Labs: Basic Metabolic Panel:  Recent Labs Lab 05/26/15 0531 05/27/15 0434 05/30/15 0340 06/01/15 0532  NA 136 138 135  --   K 4.3 4.0 4.2  --   CL 106 106 101  --   CO2 24 29 29   --   GLUCOSE 127* 115* 85  --   BUN 7 9 <5*  --   CREATININE 0.65 0.70 0.64 0.72  CALCIUM 8.2* 8.2* 8.8*  --    Liver Function Tests: No results for input(s): AST, ALT, ALKPHOS, BILITOT, PROT, ALBUMIN in the last 168 hours. No results for input(s): LIPASE, AMYLASE in the last 168 hours. No results for input(s): AMMONIA in the last 168 hours. CBC:  Recent Labs Lab 05/25/15 2155 05/26/15 0531 05/27/15 0434 05/30/15 0340  WBC 17.7* 12.9* 9.8 7.5  HGB 11.7* 10.2* 10.0* 11.1*  HCT 36.3 31.6* 31.7* 34.4*  MCV 85.6 85.9 87.6 84.9  PLT 199 172 190 249   Cardiac Enzymes: No results for input(s): CKTOTAL, CKMB, CKMBINDEX, TROPONINI in the last 168 hours. BNP: BNP (last 3 results) No results for input(s): BNP in the last 8760 hours.  ProBNP (last 3 results) No results for input(s): PROBNP in the last 8760 hours.  CBG: No results for input(s): GLUCAP in the last 168 hours.  Active Problems:   Acute perforated appendicitis   Time coordinating discharge: <30 mins  Signed:  Khrystyna Schwalm, ANP-BC

## 2015-06-13 ENCOUNTER — Encounter (HOSPITAL_COMMUNITY): Payer: Self-pay | Admitting: *Deleted

## 2015-06-13 ENCOUNTER — Emergency Department (HOSPITAL_COMMUNITY): Payer: Medicaid Other

## 2015-06-13 ENCOUNTER — Emergency Department (HOSPITAL_COMMUNITY)
Admission: EM | Admit: 2015-06-13 | Discharge: 2015-06-14 | Disposition: A | Discharge status: Home / Self Care | Payer: Medicaid Other | Attending: Emergency Medicine | Admitting: Emergency Medicine

## 2015-06-13 DIAGNOSIS — Z3202 Encounter for pregnancy test, result negative: Secondary | ICD-10-CM | POA: Diagnosis not present

## 2015-06-13 DIAGNOSIS — Z79899 Other long term (current) drug therapy: Secondary | ICD-10-CM | POA: Insufficient documentation

## 2015-06-13 DIAGNOSIS — R109 Unspecified abdominal pain: Secondary | ICD-10-CM | POA: Diagnosis present

## 2015-06-13 DIAGNOSIS — K219 Gastro-esophageal reflux disease without esophagitis: Secondary | ICD-10-CM | POA: Diagnosis not present

## 2015-06-13 DIAGNOSIS — R112 Nausea with vomiting, unspecified: Secondary | ICD-10-CM | POA: Insufficient documentation

## 2015-06-13 LAB — COMPREHENSIVE METABOLIC PANEL
ALT: 27 U/L (ref 14–54)
AST: 25 U/L (ref 15–41)
Albumin: 4 g/dL (ref 3.5–5.0)
Alkaline Phosphatase: 67 U/L (ref 38–126)
Anion gap: 10 (ref 5–15)
BUN: 6 mg/dL (ref 6–20)
CO2: 25 mmol/L (ref 22–32)
Calcium: 9.8 mg/dL (ref 8.9–10.3)
Chloride: 104 mmol/L (ref 101–111)
Creatinine, Ser: 0.77 mg/dL (ref 0.44–1.00)
GFR calc Af Amer: >60 mL/min (ref >60)
GFR calc non Af Amer: >60 mL/min (ref >60)
Glucose, Bld: 138 mg/dL — ABNORMAL HIGH (ref 65–99)
Potassium: 3.9 mmol/L (ref 3.5–5.1)
Sodium: 139 mmol/L (ref 135–145)
Total Bilirubin: 0.7 mg/dL (ref 0.3–1.2)
Total Protein: 7.5 g/dL (ref 6.5–8.1)

## 2015-06-13 LAB — URINALYSIS, ROUTINE W REFLEX MICROSCOPIC
Bilirubin Urine: NEGATIVE
Glucose, UA: NEGATIVE mg/dL
Hgb urine dipstick: NEGATIVE
Ketones, ur: 15 mg/dL — AB
Leukocytes, UA: NEGATIVE
Nitrite: NEGATIVE
Protein, ur: 30 mg/dL — AB
Specific Gravity, Urine: 1.019 (ref 1.005–1.030)
Urobilinogen, UA: 0.2 mg/dL (ref 0.0–1.0)
pH: 7.5 (ref 5.0–8.0)

## 2015-06-13 LAB — URINE MICROSCOPIC-ADD ON

## 2015-06-13 LAB — CBC
HCT: 38.8 % (ref 36.0–46.0)
Hemoglobin: 12.7 g/dL (ref 12.0–15.0)
MCH: 27.3 pg (ref 26.0–34.0)
MCHC: 32.7 g/dL (ref 30.0–36.0)
MCV: 83.3 fL (ref 78.0–100.0)
Platelets: 314 10*3/uL (ref 150–400)
RBC: 4.66 MIL/uL (ref 3.87–5.11)
RDW: 14.2 % (ref 11.5–15.5)
WBC: 9.4 10*3/uL (ref 4.0–10.5)

## 2015-06-13 LAB — LIPASE, BLOOD: Lipase: 26 U/L (ref 22–51)

## 2015-06-13 LAB — I-STAT CG4 LACTIC ACID, ED: Lactic Acid, Venous: 1.45 mmol/L (ref 0.5–2.0)

## 2015-06-13 LAB — POC URINE PREG, ED: Preg Test, Ur: NEGATIVE

## 2015-06-13 MED ORDER — SODIUM CHLORIDE 0.9 % IV BOLUS (SEPSIS)
500.0000 mL | Freq: Once | INTRAVENOUS | Status: AC
  Administered 2015-06-13: 500 mL via INTRAVENOUS

## 2015-06-13 MED ORDER — PROMETHAZINE HCL 25 MG/ML IJ SOLN
25.0000 mg | Freq: Once | INTRAMUSCULAR | Status: AC
  Administered 2015-06-13: 25 mg via INTRAVENOUS
  Filled 2015-06-13: qty 1

## 2015-06-13 MED ORDER — ONDANSETRON 4 MG PO TBDP: ORAL_TABLET | ORAL | Status: DC

## 2015-06-13 MED ORDER — FENTANYL CITRATE (PF) 100 MCG/2ML IJ SOLN
100.0000 ug | Freq: Once | INTRAMUSCULAR | Status: AC
  Administered 2015-06-13: 100 ug via INTRAVENOUS
  Filled 2015-06-13: qty 2

## 2015-06-13 MED ORDER — IOHEXOL 300 MG/ML  SOLN
100.0000 mL | Freq: Once | INTRAMUSCULAR | Status: AC | PRN
  Administered 2015-06-13: 100 mL via INTRAVENOUS

## 2015-06-13 MED ORDER — METOCLOPRAMIDE HCL 5 MG/ML IJ SOLN
10.0000 mg | Freq: Once | INTRAMUSCULAR | Status: AC
  Administered 2015-06-13: 10 mg via INTRAVENOUS
  Filled 2015-06-13: qty 2

## 2015-06-13 MED ORDER — ONDANSETRON 4 MG PO TBDP
ORAL_TABLET | ORAL | Status: AC
  Filled 2015-06-13: qty 1

## 2015-06-13 MED ORDER — ONDANSETRON 4 MG PO TBDP
4.0000 mg | ORAL_TABLET | Freq: Once | ORAL | Status: AC | PRN
  Administered 2015-06-13: 4 mg via ORAL

## 2015-06-13 MED ORDER — HYDROMORPHONE HCL 1 MG/ML IJ SOLN
1.0000 mg | Freq: Once | INTRAMUSCULAR | Status: AC
  Administered 2015-06-13: 1 mg via INTRAVENOUS
  Filled 2015-06-13: qty 1

## 2015-06-13 MED ORDER — SODIUM CHLORIDE 0.9 % IV BOLUS (SEPSIS): 500.0000 mL | Freq: Once | INTRAVENOUS | Status: AC

## 2015-06-13 MED ORDER — IOHEXOL 300 MG/ML  SOLN
25.0000 mL | Freq: Once | INTRAMUSCULAR | Status: AC | PRN
  Administered 2015-06-13: 25 mL via ORAL

## 2015-06-13 NOTE — ED Notes (Signed)
PA at bedside at this time.  

## 2015-06-13 NOTE — ED Notes (Signed)
Patient transported to CT 

## 2015-06-13 NOTE — ED Provider Notes (Signed)
CSN: [id number]     Arrival date & time 06/13/15  1710 History   First MD Initiated Contact with Patient 06/13/15 1857     Chief Complaint  Patient presents with  . Abdominal Pain  . Nausea  . Emesis     (Consider location/radiation/quality/duration/timing/severity/associated sxs/prior Treatment) HPI [name] is a 29 y.o. female with recent appendectomy on 05/25/15 comes in for evaluation of abd discomfort with associated N/V. Pt thinks she may have gotten food poisoning because she ate a [place] burger that she left out on the stove last night. She reports associated nausea, vomiting and abdominal discomfort started or this morning. She denies fevers, chills, urinary symptoms, pelvic pain, vaginal bleeding or discharge. No Diarrhea or constipation. Rates abdominal discomfort as severe. Nothing makes this problem better or worse. No interventions tried. No other aggravating or modifying factors  Past Medical History  Diagnosis Date  . GERD (gastroesophageal reflux disease)   . Foot pain   . Gastritis 4098,1191  . Seasonal allergies    Past Surgical History  Procedure Laterality Date  . Foot surgery    . Wrist surgery    . Breast biopsy Right 05/03/13  . Appendectomy N/A 05/25/2015    Procedure: APPENDECTOMY;  Surgeon: Manus Rudd, MD;  Location: [facility] OR;  Service: General;  Laterality: N/A;   Family History  Problem Relation Age of Onset  . Hypertension Father   . Diabetes Father   . Liver cancer Paternal Grandfather   . Diabetes Maternal Grandmother   . Kidney failure Maternal Grandmother    Social History  Substance Use Topics  . Smoking status: Never Smoker   . Smokeless tobacco: Never Used  . Alcohol Use: Yes     Comment: occ   OB History    Gravida Para Term Preterm AB TAB SAB Ectopic Multiple Living   2 1        1       Obstetric Comments   1st Menstrual Cycle:  12 1st Pregnancy: 20     Review of Systems A 10 point review of systems was completed and was  negative except for pertinent positives and negatives as mentioned in the history of present illness     Allergies  Morphine and related  Home Medications   Prior to Admission medications   Medication Sig Start Date End Date Taking? Authorizing Provider  cyclobenzaprine (FLEXERIL) 5 MG tablet Take 5 mg by mouth 3 (three) times daily as needed for muscle spasms.   Yes Historical Provider, MD  ibuprofen (ADVIL,MOTRIN) 600 MG tablet Take 1 tablet (600 mg total) by mouth every 6 (six) hours as needed. 05/09/15  Yes Jennifer Piepenbrink, PA-C  omeprazole (PRILOSEC) 20 MG capsule Take 20 mg by mouth daily.   Yes Historical Provider, MD  promethazine (PHENERGAN) 12.5 MG tablet Take 12.5 mg by mouth daily as needed. 02/28/15  Yes Historical Provider, MD  sertraline (ZOLOFT) 100 MG tablet Take 100 mg by mouth daily. 05/16/15  Yes Historical Provider, MD  acetaminophen (TYLENOL) 325 MG tablet Take 1-2 tablets (325-650 mg total) by mouth every 6 (six) hours as needed for fever, headache, mild pain or moderate pain. 06/01/15   Emina Riebock, NP  fexofenadine (ALLEGRA) 180 MG tablet Take 180 mg by mouth daily.    Historical Provider, MD  methocarbamol (ROBAXIN) 500 MG tablet Take 2 tablets (1,000 mg total) by mouth every 8 (eight) hours as needed for muscle spasms (or pain). 06/01/15   Ashok Norris, NP  ondansetron (ZOFRAN ODT) 4 MG disintegrating tablet 4mg  ODT q4 hours prn nausea/vomit 06/13/15   Joycie Peek, PA-C  polyethylene glycol (MIRALAX / GLYCOLAX) packet Take 17 g by mouth 2 (two) times daily. 06/01/15   Emina Riebock, NP  senna-docusate (SENOKOT-S) 8.6-50 MG per tablet Take 1 tablet by mouth 2 (two) times daily. 06/01/15   Emina Riebock, NP   BP 154/101 mmHg  Pulse 76  Temp(Src) 98.9 F (37.2 C) (Oral)  Resp 16  Ht 5\' 6"  (1.676 m)  Wt 220 lb (99.791 kg)  BMI 35.53 kg/m2  SpO2 97%  LMP 05/23/2015 Physical Exam  Constitutional: She is oriented to person, place, and time. She appears  well-developed and well-nourished.  HENT:  Head: Normocephalic and atraumatic.  Mouth/Throat: Oropharynx is clear and moist.  Eyes: Conjunctivae are normal. Pupils are equal, round, and reactive to light. Right eye exhibits no discharge. Left eye exhibits no discharge. No scleral icterus.  Neck: Neck supple.  Cardiovascular: Normal rate, regular rhythm and normal heart sounds.   Pulmonary/Chest: Effort normal and breath sounds normal. No respiratory distress. She has no wheezes. She has no rales.  Abdominal: Soft. There is no tenderness.  Musculoskeletal: She exhibits no tenderness.  Neurological: She is alert and oriented to person, place, and time.  Cranial Nerves II-XII grossly intact  Skin: Skin is warm and dry. No rash noted.  Psychiatric: She has a normal mood and affect.  Nursing note and vitals reviewed.   ED Course  Procedures (including critical care time) Labs Review Labs Reviewed  COMPREHENSIVE METABOLIC PANEL - Abnormal; Notable for the following:    Glucose, Bld 138 (*)    All other components within normal limits  URINALYSIS, ROUTINE W REFLEX MICROSCOPIC (NOT AT [facility]) - Abnormal; Notable for the following:    APPearance CLOUDY (*)    Ketones, ur 15 (*)    Protein, ur 30 (*)    All other components within normal limits  URINE MICROSCOPIC-ADD ON - Abnormal; Notable for the following:    Casts GRANULAR CAST (*)    All other components within normal limits  LIPASE, BLOOD  CBC  POC URINE PREG, ED  I-STAT CG4 LACTIC ACID, ED  I-STAT CG4 LACTIC ACID, ED    Imaging Review Ct Abdomen Pelvis W Contrast  06/13/2015   CLINICAL DATA:  Acute onset of generalized abdominal pain, nausea and vomiting. Diarrhea. Recent surgery for appendicitis. Initial encounter.  EXAM: CT ABDOMEN AND PELVIS WITH CONTRAST  TECHNIQUE: Multidetector CT imaging of the abdomen and pelvis was performed using the standard protocol following bolus administration of intravenous contrast.  CONTRAST:   OMNIPAQUE IOHEXOL 300 MG/ML  SOLN  COMPARISON:  CT of the abdomen and pelvis from 05/25/2015  FINDINGS: The visualized lung bases are clear.  The liver and spleen are unremarkable in appearance. The gallbladder is within normal limits. The pancreas and adrenal glands are unremarkable.  The kidneys are unremarkable in appearance. There is no evidence of hydronephrosis. No renal or ureteral stones are seen. No perinephric stranding is appreciated.  No free fluid is identified. The small bowel is unremarkable in appearance. The stomach is within normal limits. No acute vascular abnormalities are seen.  The patient is status post appendectomy, with a suture line at the cecum and about the ileocecal junction. There is mild residual ileal wall thickening at the site of surgery. Minimal stranding is seen tracking superiorly, likely postoperative in nature. The colon is entirely decompressed and unremarkable in appearance.  Postoperative  stranding is noted along the anterior midline abdominal wall, with trace associated fluid but no evidence of abscess.  The bladder is mildly distended and grossly unremarkable. The uterus is within normal limits. The ovaries are grossly symmetric, with minimal residual stranding noted at the upper pelvis and tracking about the right ovary. No suspicious adnexal masses are seen. No inguinal lymphadenopathy is seen.  No acute osseous abnormalities are identified.  IMPRESSION: 1. No acute abnormality seen to explain the patient's symptoms. 2. Status post appendectomy, with minimal residual stranding tracking about the site of surgery and upper pelvis. Mild residual ileal wall thickening at the site of surgery. This is likely within normal limits given recent surgery. 3. Postoperative stranding along the anterior abdominal wall, with trace associated postoperative fluid but no evidence of abscess.   Electronically Signed   By: Roanna Raider M.D.   On: 06/13/2015 21:35   I, Anis Degidio,  Earle Gell, personally reviewed and evaluated these images and lab results as part of my medical decision-making.   EKG Interpretation None     Meds given in ED:  Medications  ondansetron (ZOFRAN-ODT) disintegrating tablet 4 mg (4 mg Oral Given 06/13/15 1725)  iohexol (OMNIPAQUE) 300 MG/ML solution 25 mL (25 mLs Oral Contrast Given 06/13/15 1914)  promethazine (PHENERGAN) injection 25 mg (25 mg Intravenous Given 06/13/15 2002)  fentaNYL (SUBLIMAZE) injection 100 mcg (100 mcg Intravenous Given 06/13/15 1937)  promethazine (PHENERGAN) injection 25 mg (25 mg Intravenous Given 06/13/15 2138)  metoCLOPramide (REGLAN) injection 10 mg (10 mg Intravenous Given 06/13/15 2045)  iohexol (OMNIPAQUE) 300 MG/ML solution 100 mL (100 mLs Intravenous Contrast Given 06/13/15 2112)  HYDROmorphone (DILAUDID) injection 1 mg (1 mg Intravenous Given 06/13/15 2249)  sodium chloride 0.9 % bolus 500 mL (0 mLs Intravenous Duplicate 06/13/15 2315)  sodium chloride 0.9 % bolus 500 mL (0 mLs Intravenous Stopped 06/14/15 0141)    Discharge Medication List as of 06/13/2015 10:32 PM     Filed Vitals:   06/14/15 0000 06/14/15 0015 06/14/15 0030 06/14/15 0045  BP: 154/94 157/94 136/87 154/101  Pulse: 73 71 85 76  Temp:      TempSrc:      Resp:      Height:      Weight:      SpO2: 96% 96% 96% 97%    MDM  Vitals stable - -afebrile Pt resting comfortably in ED. is sleeping comfortably upon recheck at 10:30 PM, but upon awakening requests more pain medicine and IV fluids. Tolerating PO. PE-No focal abd tenderness. Physical exam otherwise not concerning Labwork--baseline, not concerning Imaging--CT shows no acute abnormalities to explain patient's symptoms. Normal postoperative changes  DC with anti-emetics. No evidence of acute intra-abdominal pathology. No evidence of post-op emergency. I discussed all relevant lab findings and imaging results with pt and they verbalized understanding. I personally reviewed the imaging  and agree with the results as interpreted by the radiologist.  Discussed f/u with PCP within 48 hrs and return precautions, pt very amenable to plan.  Final diagnoses:  Abdominal discomfort  Non-intractable vomiting with nausea, vomiting of unspecified type       Joycie Peek, PA-C 06/14/15 1509  Vanetta Mulders, MD 06/18/15 1806

## 2015-06-13 NOTE — Discharge Instructions (Signed)
You were evaluated in the ED today for your, discomfort. Your labs and CT scan were very reassuring. It does not appear to be an emergent cause your symptoms at this time. In follow-up with your doctor and surgeon for further evaluation and management of your symptoms. He states sore medicines as needed for nausea. Return to ED for worsening symptoms.  Abdominal Pain Many things can cause abdominal pain. Usually, abdominal pain is not caused by a disease and will improve without treatment. It can often be observed and treated at home. Your health care provider will do a physical exam and possibly order blood tests and X-rays to help determine the seriousness of your pain. However, in many cases, more time must pass before a clear cause of the pain can be found. Before that point, your health care provider may not know if you need more testing or further treatment. HOME CARE INSTRUCTIONS  Monitor your abdominal pain for any changes. The following actions may help to alleviate any discomfort you are experiencing:  Only take over-the-counter or prescription medicines as directed by your health care provider.  Do not take laxatives unless directed to do so by your health care provider.  Try a clear liquid diet (broth, tea, or water) as directed by your health care provider. Slowly move to a bland diet as tolerated. SEEK MEDICAL CARE IF:  You have unexplained abdominal pain.  You have abdominal pain associated with nausea or diarrhea.  You have pain when you urinate or have a bowel movement.  You experience abdominal pain that wakes you in the night.  You have abdominal pain that is worsened or improved by eating food.  You have abdominal pain that is worsened with eating fatty foods.  You have a fever. SEEK IMMEDIATE MEDICAL CARE IF:   Your pain does not go away within 2 hours.  You keep throwing up (vomiting).  Your pain is felt only in portions of the abdomen, such as the right side or  the left lower portion of the abdomen.  You pass bloody or black tarry stools. MAKE SURE YOU:  Understand these instructions.   Will watch your condition.   Will get help right away if you are not doing well or get worse.  Document Released: 07/28/2005 Document Revised: 10/23/2013 Document Reviewed: 06/27/2013 [facility] Patient Information 2015 [place], [place]. This information is not intended to replace advice given to you by your health care provider. Make sure you discuss any questions you have with your health care provider.

## 2015-06-13 NOTE — ED Notes (Signed)
Patient has continued to vomit despite the zofran odt.   She is being moved to a room.

## 2015-06-13 NOTE — ED Notes (Signed)
Patient reports she had appendicitis surgery 2 weeks ago.  Patient with onset of abd pain and n/v today.  She is also having diarrhea.  Patient states no one else is sick at home.  Patient took zofran at home at 1330  Patient states she has emesis after taking the medication.  Her surgeon was Tseiu

## 2015-06-13 NOTE — ED Notes (Signed)
Patient asleep when RN entered room & continued to fall back to sleep while RN present. When awakened from sleep and asked regarding abdominal pain, patient reports pain as 7 on 0-10 pain scale and promptly fell back to sleep. NS bolus still infusing at this time.

## 2015-06-14 ENCOUNTER — Emergency Department (HOSPITAL_COMMUNITY)
Admission: EM | Admit: 2015-06-14 | Discharge: 2015-06-14 | Disposition: A | Discharge status: Home / Self Care | Payer: Medicaid Other | Source: Home / Self Care | Attending: Emergency Medicine | Admitting: Emergency Medicine

## 2015-06-14 ENCOUNTER — Encounter (HOSPITAL_COMMUNITY): Payer: Self-pay

## 2015-06-14 ENCOUNTER — Emergency Department (HOSPITAL_COMMUNITY): Payer: Medicaid Other

## 2015-06-14 DIAGNOSIS — Z79899 Other long term (current) drug therapy: Secondary | ICD-10-CM

## 2015-06-14 DIAGNOSIS — R112 Nausea with vomiting, unspecified: Secondary | ICD-10-CM | POA: Insufficient documentation

## 2015-06-14 DIAGNOSIS — K2970 Gastritis, unspecified, without bleeding: Secondary | ICD-10-CM

## 2015-06-14 DIAGNOSIS — R1084 Generalized abdominal pain: Secondary | ICD-10-CM | POA: Insufficient documentation

## 2015-06-14 DIAGNOSIS — K219 Gastro-esophageal reflux disease without esophagitis: Secondary | ICD-10-CM | POA: Insufficient documentation

## 2015-06-14 LAB — COMPREHENSIVE METABOLIC PANEL
ALT: 25 U/L (ref 14–54)
AST: 29 U/L (ref 15–41)
Albumin: 4 g/dL (ref 3.5–5.0)
Alkaline Phosphatase: 60 U/L (ref 38–126)
Anion gap: 12 (ref 5–15)
BUN: <5 mg/dL — ABNORMAL LOW (ref 6–20)
CO2: 26 mmol/L (ref 22–32)
Calcium: 9.4 mg/dL (ref 8.9–10.3)
Chloride: 102 mmol/L (ref 101–111)
Creatinine, Ser: 0.73 mg/dL (ref 0.44–1.00)
GFR calc Af Amer: >60 mL/min (ref >60)
GFR calc non Af Amer: >60 mL/min (ref >60)
Glucose, Bld: 91 mg/dL (ref 65–99)
Potassium: 3 mmol/L — ABNORMAL LOW (ref 3.5–5.1)
Sodium: 140 mmol/L (ref 135–145)
Total Bilirubin: 0.6 mg/dL (ref 0.3–1.2)
Total Protein: 7.7 g/dL (ref 6.5–8.1)

## 2015-06-14 LAB — URINE MICROSCOPIC-ADD ON

## 2015-06-14 LAB — CBC WITH DIFFERENTIAL/PLATELET
Basophils Absolute: 0 10*3/uL (ref 0.0–0.1)
Basophils Relative: 0 % (ref 0–1)
Eosinophils Absolute: 0 10*3/uL (ref 0.0–0.7)
Eosinophils Relative: 0 % (ref 0–5)
HCT: 41 % (ref 36.0–46.0)
Hemoglobin: 13.2 g/dL (ref 12.0–15.0)
Lymphocytes Relative: 17 % (ref 12–46)
Lymphs Abs: 1.6 10*3/uL (ref 0.7–4.0)
MCH: 27.4 pg (ref 26.0–34.0)
MCHC: 32.2 g/dL (ref 30.0–36.0)
MCV: 85.1 fL (ref 78.0–100.0)
Monocytes Absolute: 0.6 10*3/uL (ref 0.1–1.0)
Monocytes Relative: 6 % (ref 3–12)
Neutro Abs: 7.3 10*3/uL (ref 1.7–7.7)
Neutrophils Relative %: 77 % (ref 43–77)
Platelets: 301 10*3/uL (ref 150–400)
RBC: 4.82 MIL/uL (ref 3.87–5.11)
RDW: 14.4 % (ref 11.5–15.5)
WBC: 9.5 10*3/uL (ref 4.0–10.5)

## 2015-06-14 LAB — URINALYSIS, ROUTINE W REFLEX MICROSCOPIC
Bilirubin Urine: NEGATIVE
Glucose, UA: NEGATIVE mg/dL
Hgb urine dipstick: NEGATIVE
Ketones, ur: 15 mg/dL — AB
Leukocytes, UA: NEGATIVE
Nitrite: NEGATIVE
Protein, ur: 30 mg/dL — AB
Specific Gravity, Urine: 1.018 (ref 1.005–1.030)
Urobilinogen, UA: 0.2 mg/dL (ref 0.0–1.0)
pH: 6.5 (ref 5.0–8.0)

## 2015-06-14 LAB — LIPASE, BLOOD: Lipase: 24 U/L (ref 22–51)

## 2015-06-14 MED ORDER — SODIUM CHLORIDE 0.9 % IV BOLUS (SEPSIS)
1000.0000 mL | Freq: Once | INTRAVENOUS | Status: AC
  Administered 2015-06-14: 1000 mL via INTRAVENOUS

## 2015-06-14 MED ORDER — FENTANYL CITRATE (PF) 100 MCG/2ML IJ SOLN
100.0000 ug | Freq: Once | INTRAMUSCULAR | Status: AC
  Administered 2015-06-14: 100 ug via INTRAVENOUS
  Filled 2015-06-14: qty 2

## 2015-06-14 MED ORDER — PROMETHAZINE HCL 12.5 MG PO TABS: 12.5000 mg | ORAL_TABLET | Freq: Every day | ORAL | Status: DC | PRN

## 2015-06-14 MED ORDER — ONDANSETRON HCL 4 MG/2ML IJ SOLN
4.0000 mg | Freq: Once | INTRAMUSCULAR | Status: AC
  Administered 2015-06-14: 4 mg via INTRAVENOUS
  Filled 2015-06-14: qty 2

## 2015-06-14 MED ORDER — HYDROMORPHONE HCL 1 MG/ML IJ SOLN
1.0000 mg | Freq: Once | INTRAMUSCULAR | Status: AC
  Administered 2015-06-14: 1 mg via INTRAVENOUS
  Filled 2015-06-14: qty 1

## 2015-06-14 MED ORDER — SODIUM CHLORIDE 0.9 % IV SOLN
1000.0000 mL | Freq: Once | INTRAVENOUS | Status: AC
  Administered 2015-06-14: 1000 mL via INTRAVENOUS

## 2015-06-14 NOTE — ED Provider Notes (Signed)
CSN: [id number]     Arrival date & time 06/14/15  1600 History   First MD Initiated Contact with Patient 06/14/15 1846     Chief Complaint  Patient presents with  . Emesis     (Consider location/radiation/quality/duration/timing/severity/associated sxs/prior Treatment) HPI Patient presents with concern of ongoing abdominal pain, nausea, vomiting. Patient states that in particular she has developed this episode of severe diffuse abdominal pain over the past 24 hours. She acknowledges that she was seen here yesterday, states the pain is the same as yesterday. She continues to be anorexic, with nausea, and has been no bowel movement since yesterday. She denies any fevers, chills, confusion, disorientation, chest pain, dyspnea. Pain is not relieved with previously prescribed narcotic medication.  Past Medical History  Diagnosis Date  . GERD (gastroesophageal reflux disease)   . Foot pain   . Gastritis 4098,1191  . Seasonal allergies    Past Surgical History  Procedure Laterality Date  . Foot surgery    . Wrist surgery    . Breast biopsy Right 05/03/13  . Appendectomy N/A 05/25/2015    Procedure: APPENDECTOMY;  Surgeon: Manus Rudd, MD;  Location: [facility] OR;  Service: General;  Laterality: N/A;   Family History  Problem Relation Age of Onset  . Hypertension Father   . Diabetes Father   . Liver cancer Paternal Grandfather   . Diabetes Maternal Grandmother   . Kidney failure Maternal Grandmother    Social History  Substance Use Topics  . Smoking status: Never Smoker   . Smokeless tobacco: Never Used  . Alcohol Use: Yes     Comment: occ   OB History    Gravida Para Term Preterm AB TAB SAB Ectopic Multiple Living   Obstetric Comments   1st Menstrual Cycle:  12 1st Pregnancy: 20     Review of Systems  Constitutional:       Per HPI, otherwise negative  HENT:       Per HPI, otherwise negative  Respiratory:       Per HPI, otherwise negative    Cardiovascular:       Per HPI, otherwise negative  Gastrointestinal: Positive for nausea and vomiting.  Endocrine:       Negative aside from HPI  Genitourinary:       Neg aside from HPI   Musculoskeletal:       Per HPI, otherwise negative  Skin: Negative.   Neurological: Negative for syncope.      Allergies  Morphine and related  Home Medications   Prior to Admission medications   Medication Sig Start Date End Date Taking? Authorizing Provider  acetaminophen (TYLENOL) 325 MG tablet Take 1-2 tablets (325-650 mg total) by mouth every 6 (six) hours as needed for fever, headache, mild pain or moderate pain. 06/01/15   Emina Riebock, NP  cyclobenzaprine (FLEXERIL) 5 MG tablet Take 5 mg by mouth 3 (three) times daily as needed for muscle spasms.    Historical Provider, MD  fexofenadine (ALLEGRA) 180 MG tablet Take 180 mg by mouth daily.    Historical Provider, MD  ibuprofen (ADVIL,MOTRIN) 600 MG tablet Take 1 tablet (600 mg total) by mouth every 6 (six) hours as needed. 05/09/15   Jennifer Piepenbrink, PA-C  methocarbamol (ROBAXIN) 500 MG tablet Take 2 tablets (1,000 mg total) by mouth every 8 (eight) hours as needed for muscle spasms (or pain). 06/01/15   Ashok Norris, NP  omeprazole (PRILOSEC) 20 MG capsule Take 20 mg by mouth daily.    Historical Provider, MD  ondansetron (ZOFRAN ODT) 4 MG disintegrating tablet 4mg  ODT q4 hours prn nausea/vomit 06/13/15   Joycie Peek, PA-C  polyethylene glycol (MIRALAX / GLYCOLAX) packet Take 17 g by mouth 2 (two) times daily. 06/01/15   Emina Riebock, NP  promethazine (PHENERGAN) 12.5 MG tablet Take 12.5 mg by mouth daily as needed. 02/28/15   Historical Provider, MD  senna-docusate (SENOKOT-S) 8.6-50 MG per tablet Take 1 tablet by mouth 2 (two) times daily. 06/01/15   Emina Riebock, NP  sertraline (ZOLOFT) 100 MG tablet Take 100 mg by mouth daily. 05/16/15   Historical Provider, MD   BP 134/76 mmHg  Pulse 79  Temp(Src) 99 F (37.2 C) (Oral)  Resp  12  Wt 213 lb (96.616 kg)  SpO2 96%  LMP 05/23/2015 Physical Exam  Constitutional: She is oriented to person, place, and time. She appears well-developed and well-nourished. No distress.  HENT:  Head: Normocephalic and atraumatic.  Eyes: Conjunctivae and EOM are normal.  Cardiovascular: Normal rate and regular rhythm.   Pulmonary/Chest: Effort normal and breath sounds normal. No stridor. No respiratory distress.  Abdominal: She exhibits no distension.    Musculoskeletal: She exhibits no edema.  Neurological: She is alert and oriented to person, place, and time. No cranial nerve deficit.  Skin: Skin is warm and dry.  Psychiatric: She has a normal mood and affect.  Nursing note and vitals reviewed.   ED Course  Procedures (including critical care time) Labs Review Labs Reviewed  COMPREHENSIVE METABOLIC PANEL - Abnormal; Notable for the following:    Potassium 3.0 (*)    BUN <5 (*)    All other components within normal limits  URINALYSIS, ROUTINE W REFLEX MICROSCOPIC (NOT AT [facility]) - Abnormal; Notable for the following:    Color, Urine AMBER (*)    APPearance CLOUDY (*)    Ketones, ur 15 (*)    Protein, ur 30 (*)    All other components within normal limits  URINE MICROSCOPIC-ADD ON - Abnormal; Notable for the following:    Squamous Epithelial / LPF FEW (*)    Bacteria, UA MANY (*)    Casts HYALINE CASTS (*)    All other components within normal limits  CBC WITH DIFFERENTIAL/PLATELET  LIPASE, BLOOD    Imaging Review Ct Abdomen Pelvis W Contrast  06/13/2015   CLINICAL DATA:  Acute onset of generalized abdominal pain, nausea and vomiting. Diarrhea. Recent surgery for appendicitis. Initial encounter.  EXAM: CT ABDOMEN AND PELVIS WITH CONTRAST  TECHNIQUE: Multidetector CT imaging of the abdomen and pelvis was performed using the standard protocol following bolus administration of intravenous contrast.  CONTRAST:  OMNIPAQUE IOHEXOL 300 MG/ML  SOLN  COMPARISON:  CT of the  abdomen and pelvis from 05/25/2015  FINDINGS: The visualized lung bases are clear.  The liver and spleen are unremarkable in appearance. The gallbladder is within normal limits. The pancreas and adrenal glands are unremarkable.  The kidneys are unremarkable in appearance. There is no evidence of hydronephrosis. No renal or ureteral stones are seen. No perinephric stranding is appreciated.  No free fluid is identified. The small bowel is unremarkable in appearance. The stomach is within normal limits. No acute vascular abnormalities are seen.  The patient is status post appendectomy, with a suture line at the cecum and about the ileocecal junction. There is mild residual ileal wall thickening at the site of surgery. Minimal stranding is  seen tracking superiorly, likely postoperative in nature. The colon is entirely decompressed and unremarkable in appearance.  Postoperative stranding is noted along the anterior midline abdominal wall, with trace associated fluid but no evidence of abscess.  The bladder is mildly distended and grossly unremarkable. The uterus is within normal limits. The ovaries are grossly symmetric, with minimal residual stranding noted at the upper pelvis and tracking about the right ovary. No suspicious adnexal masses are seen. No inguinal lymphadenopathy is seen.  No acute osseous abnormalities are identified.  IMPRESSION: 1. No acute abnormality seen to explain the patient's symptoms. 2. Status post appendectomy, with minimal residual stranding tracking about the site of surgery and upper pelvis. Mild residual ileal wall thickening at the site of surgery. This is likely within normal limits given recent surgery. 3. Postoperative stranding along the anterior abdominal wall, with trace associated postoperative fluid but no evidence of abscess.   Electronically Signed   By: Roanna Raider M.D.   On: 06/13/2015 21:35   Dg Abd Acute W/chest  06/14/2015   CLINICAL DATA:  Nausea and vomiting  EXAM:  DG ABDOMEN ACUTE W/ 1V CHEST  COMPARISON:  None.  FINDINGS: Cardiac shadow is within normal limits. The lungs are clear bilaterally. Scattered large and small bowel gas is noted. No free air is noted. Postsurgical changes are seen in the right lower quadrant consistent with the patient's given clinical history. No obstructive changes are seen.  IMPRESSION: No acute abnormality noted.   Electronically Signed   By: Alcide Clever M.D.   On: 06/14/2015 19:55   I, Yanelly Cantrelle, personally reviewed and evaluated these images and lab results as part of my medical decision-making.   Chart review demonstrates that the patient was seen here yesterday for similar concerns, and had surgical procedure 2 weeks ago for perforated appendicitis.    EKG Interpretation None     11:09 PM Patient in no distress, awake and alert, interacting appropriately. We had a lengthy conversation about all findings, both today, and yesterday. Patient acknowledges that she is enrolled in a pain management clinic. We discussed the likelihood of ileus, with no evidence for acute infectious infection, abscess, obstruction. Patient will follow up with her pain management clinic. She does request additional IV fluid resuscitation here prior to discharge, and this was accommodated.  MDM  Patient presents with concern of ongoing abdominal pain, nausea, vomiting. Patient has a notable history of recent surgery for ruptured appendicitis. Patient also has a history of chronic pain, is enrolled any pain management clinic. Here the patient is awake, alert, with soft, non-peritoneal abdomen, though with some concern for postoperative infection versus ileus versus obstruction. CT yesterday, x-ray today, both reassuring for no evidence of obstruction or abscess. Patient improved here, though she had persistent nausea. After lengthy conversations with the patient about the need for close monitoring, primary care follow-up, fluids, stool  softeners, she is discharged in stable condition.  Gerhard Munch, MD 06/14/15 [phone]

## 2015-06-14 NOTE — ED Notes (Signed)
Patient is requesting something for Nausea and Pain.

## 2015-06-14 NOTE — ED Notes (Signed)
1000 mL Fluid infusion complete.

## 2015-06-14 NOTE — Discharge Instructions (Signed)
As discussed, your evaluation today has been largely reassuring.  But, it is important that you monitor your condition carefully, and do not hesitate to return to the ED if you develop new, or concerning changes in your condition. ? ?Otherwise, please follow-up with your physician for appropriate ongoing care. ? ?

## 2015-06-14 NOTE — ED Notes (Signed)
Pt. Left with all belongings and refused wheelchair. Discharge instructions were reviewed and all questions were answered.  

## 2015-06-14 NOTE — ED Notes (Signed)
Gave pt ice water, per Erin - RN 

## 2015-06-14 NOTE — ED Notes (Signed)
Pt reports was seen in ED last night for vomiting, nausea.  Pt reports has filled 3 emesis bags since leaving ED.  Unable to tolerate PO Zofran.

## 2015-06-14 NOTE — ED Notes (Signed)
Pt stable, ambulatory, states understanding of discharge instructions 

## 2015-06-15 ENCOUNTER — Emergency Department (HOSPITAL_COMMUNITY)
Admission: EM | Admit: 2015-06-15 | Discharge: 2015-06-15 | Disposition: A | Discharge status: Home / Self Care | Payer: Medicaid Other | Attending: Emergency Medicine | Admitting: Emergency Medicine

## 2015-06-15 ENCOUNTER — Encounter (HOSPITAL_COMMUNITY): Payer: Self-pay | Admitting: *Deleted

## 2015-06-15 DIAGNOSIS — Z79899 Other long term (current) drug therapy: Secondary | ICD-10-CM | POA: Insufficient documentation

## 2015-06-15 DIAGNOSIS — K219 Gastro-esophageal reflux disease without esophagitis: Secondary | ICD-10-CM | POA: Insufficient documentation

## 2015-06-15 DIAGNOSIS — R1110 Vomiting, unspecified: Secondary | ICD-10-CM | POA: Insufficient documentation

## 2015-06-15 DIAGNOSIS — R197 Diarrhea, unspecified: Secondary | ICD-10-CM | POA: Diagnosis present

## 2015-06-15 DIAGNOSIS — R1084 Generalized abdominal pain: Secondary | ICD-10-CM | POA: Insufficient documentation

## 2015-06-15 LAB — COMPREHENSIVE METABOLIC PANEL
ALT: 35 U/L (ref 14–54)
AST: 46 U/L — ABNORMAL HIGH (ref 15–41)
Albumin: 3.9 g/dL (ref 3.5–5.0)
Alkaline Phosphatase: 57 U/L (ref 38–126)
Anion gap: 11 (ref 5–15)
BUN: 6 mg/dL (ref 6–20)
CO2: 25 mmol/L (ref 22–32)
Calcium: 9.3 mg/dL (ref 8.9–10.3)
Chloride: 101 mmol/L (ref 101–111)
Creatinine, Ser: 0.73 mg/dL (ref 0.44–1.00)
GFR calc Af Amer: >60 mL/min (ref >60)
GFR calc non Af Amer: >60 mL/min (ref >60)
Glucose, Bld: 109 mg/dL — ABNORMAL HIGH (ref 65–99)
Potassium: 3.7 mmol/L (ref 3.5–5.1)
Sodium: 137 mmol/L (ref 135–145)
Total Bilirubin: 1.2 mg/dL (ref 0.3–1.2)
Total Protein: 7.4 g/dL (ref 6.5–8.1)

## 2015-06-15 LAB — URINALYSIS, ROUTINE W REFLEX MICROSCOPIC
Bilirubin Urine: NEGATIVE
Glucose, UA: NEGATIVE mg/dL
Hgb urine dipstick: NEGATIVE
Ketones, ur: 15 mg/dL — AB
Leukocytes, UA: NEGATIVE
Nitrite: NEGATIVE
Protein, ur: NEGATIVE mg/dL
Specific Gravity, Urine: 1.015 (ref 1.005–1.030)
Urobilinogen, UA: 0.2 mg/dL (ref 0.0–1.0)
pH: 8 (ref 5.0–8.0)

## 2015-06-15 LAB — CBC WITH DIFFERENTIAL/PLATELET
Basophils Absolute: 0 10*3/uL (ref 0.0–0.1)
Basophils Relative: 0 % (ref 0–1)
Eosinophils Absolute: 0 10*3/uL (ref 0.0–0.7)
Eosinophils Relative: 0 % (ref 0–5)
HCT: 37 % (ref 36.0–46.0)
Hemoglobin: 12.2 g/dL (ref 12.0–15.0)
Lymphocytes Relative: 11 % — ABNORMAL LOW (ref 12–46)
Lymphs Abs: 1.1 10*3/uL (ref 0.7–4.0)
MCH: 27.7 pg (ref 26.0–34.0)
MCHC: 33 g/dL (ref 30.0–36.0)
MCV: 84.1 fL (ref 78.0–100.0)
Monocytes Absolute: 0.3 10*3/uL (ref 0.1–1.0)
Monocytes Relative: 3 % (ref 3–12)
Neutro Abs: 8 10*3/uL — ABNORMAL HIGH (ref 1.7–7.7)
Neutrophils Relative %: 86 % — ABNORMAL HIGH (ref 43–77)
Platelets: 291 10*3/uL (ref 150–400)
RBC: 4.4 MIL/uL (ref 3.87–5.11)
RDW: 14.4 % (ref 11.5–15.5)
WBC: 9.3 10*3/uL (ref 4.0–10.5)

## 2015-06-15 LAB — RAPID URINE DRUG SCREEN, HOSP PERFORMED
Amphetamines: NOT DETECTED
Barbiturates: NOT DETECTED
Benzodiazepines: NOT DETECTED
Cocaine: NOT DETECTED
Opiates: NOT DETECTED
Tetrahydrocannabinol: NOT DETECTED

## 2015-06-15 LAB — LIPASE, BLOOD: Lipase: 22 U/L (ref 22–51)

## 2015-06-15 MED ORDER — PROMETHAZINE HCL 25 MG RE SUPP: 25.0000 mg | Freq: Four times a day (QID) | RECTAL | Status: DC | PRN

## 2015-06-15 MED ORDER — SODIUM CHLORIDE 0.9 % IV SOLN
1000.0000 mL | Freq: Once | INTRAVENOUS | Status: AC
  Administered 2015-06-15: 1000 mL via INTRAVENOUS

## 2015-06-15 MED ORDER — ONDANSETRON HCL 4 MG/2ML IJ SOLN
4.0000 mg | Freq: Once | INTRAMUSCULAR | Status: AC
  Administered 2015-06-15: 4 mg via INTRAVENOUS
  Filled 2015-06-15: qty 2

## 2015-06-15 MED ORDER — SODIUM CHLORIDE 0.9 % IV BOLUS (SEPSIS)
1000.0000 mL | Freq: Once | INTRAVENOUS | Status: AC
  Administered 2015-06-15: 1000 mL via INTRAVENOUS

## 2015-06-15 MED ORDER — ONDANSETRON 4 MG PO TBDP
4.0000 mg | ORAL_TABLET | Freq: Once | ORAL | Status: AC | PRN
  Administered 2015-06-15: 4 mg via ORAL

## 2015-06-15 MED ORDER — GI COCKTAIL ~~LOC~~
30.0000 mL | Freq: Once | ORAL | Status: AC
  Administered 2015-06-15: 30 mL via ORAL
  Filled 2015-06-15: qty 30

## 2015-06-15 MED ORDER — PROMETHAZINE HCL 25 MG/ML IJ SOLN
25.0000 mg | Freq: Once | INTRAMUSCULAR | Status: AC
  Administered 2015-06-15: 25 mg via INTRAVENOUS
  Filled 2015-06-15: qty 1

## 2015-06-15 MED ORDER — DIAZEPAM 5 MG/ML IJ SOLN
2.5000 mg | Freq: Once | INTRAMUSCULAR | Status: AC
  Administered 2015-06-15: 2.5 mg via INTRAVENOUS
  Filled 2015-06-15: qty 2

## 2015-06-15 MED ORDER — HYDROMORPHONE HCL 1 MG/ML IJ SOLN
1.0000 mg | Freq: Once | INTRAMUSCULAR | Status: AC
  Administered 2015-06-15: 1 mg via INTRAVENOUS
  Filled 2015-06-15: qty 1

## 2015-06-15 MED ORDER — METOCLOPRAMIDE HCL 5 MG/ML IJ SOLN
10.0000 mg | Freq: Once | INTRAMUSCULAR | Status: AC
  Administered 2015-06-15: 10 mg via INTRAVENOUS
  Filled 2015-06-15: qty 2

## 2015-06-15 MED ORDER — ONDANSETRON 4 MG PO TBDP
ORAL_TABLET | ORAL | Status: AC
  Filled 2015-06-15: qty 1

## 2015-06-15 MED ORDER — DIPHENHYDRAMINE HCL 50 MG/ML IJ SOLN
25.0000 mg | Freq: Once | INTRAMUSCULAR | Status: AC
  Administered 2015-06-15: 25 mg via INTRAVENOUS
  Filled 2015-06-15: qty 1

## 2015-06-15 MED ORDER — PANTOPRAZOLE SODIUM 40 MG IV SOLR
40.0000 mg | Freq: Once | INTRAVENOUS | Status: AC
  Administered 2015-06-15: 40 mg via INTRAVENOUS
  Filled 2015-06-15: qty 40

## 2015-06-15 NOTE — ED Notes (Addendum)
Pt reports n/v/d and abd pain x 3 days. Was here yesterday for same, reports no relief with zofran prescription.

## 2015-06-15 NOTE — ED Notes (Signed)
PA at Bedside. 

## 2015-06-15 NOTE — ED Provider Notes (Signed)
CSN: [id number]     Arrival date & time 06/15/15  1041 History   First MD Initiated Contact with Patient 06/15/15 1141     Chief Complaint  Patient presents with  . Diarrhea  . Emesis     (Consider location/radiation/quality/duration/timing/severity/associated sxs/prior Treatment) HPI   [name] is a 29 y.o. female with recent appendectomy on 05/25/2015, and past medical history of GERD, gastritis, chronic foot pain requiring pain management for narcotic use, who presents to the ER this morning with continued nausea, vomiting and diffuse abdominal pain. She has been evaluated 2 times in the past 2 days here in the same ER, with CT scan of the abdomen was suggestive appropriate postoperative changes and localized inflammation without abscess or new acute pathology. Last night she was evaluated with acute abdomen films, and got relief with IV pain medicine and Phenergan.  Her potassium was noted low at 3.0 and abdominal pain was determined likely to be from ileus, and she was to follow up with her PCP and was given Phenergan prescription. She states she felt improved last night when she was discharged and was able to sleep through the night however woke up at 7:30 and had multiple episodes of vomiting and abdominal pain. She attempted to take the oral Phenergan and her oral pain medications but vomited them back up. The pain is located all over her abdomen, without radiation, rated 10 out of 10, and is unchanged over the last 2 days and today is associated with chills and diffuse shakes.  She denies fever, diarrhea, hematemesis, melena, hematochezia, headache, chest pain, shortness of breath, syncope.  She has not yet been to her follow-up with surgery.  Past Medical History  Diagnosis Date  . GERD (gastroesophageal reflux disease)   . Foot pain   . Gastritis [id number]  . Seasonal allergies    Past Surgical History  Procedure Laterality Date  . Foot surgery    . Wrist surgery    .  Breast biopsy Right 05/03/13  . Appendectomy N/A 05/25/2015    Procedure: APPENDECTOMY;  Surgeon: Manus Rudd, MD;  Location: [facility] OR;  Service: General;  Laterality: N/A;   Family History  Problem Relation Age of Onset  . Hypertension Father   . Diabetes Father   . Liver cancer Paternal Grandfather   . Diabetes Maternal Grandmother   . Kidney failure Maternal Grandmother    Social History  Substance Use Topics  . Smoking status: Never Smoker   . Smokeless tobacco: Never Used  . Alcohol Use: Yes     Comment: occ   OB History    Gravida Para Term Preterm AB TAB SAB Ectopic Multiple Living   2 1        1       Obstetric Comments   1st Menstrual Cycle:  12 1st Pregnancy: 20     Review of Systems  10 Systems reviewed and are negative for acute change except as noted in the HPI.     Allergies  Morphine and related  Home Medications   Prior to Admission medications   Medication Sig Start Date End Date Taking? Authorizing Provider  cyclobenzaprine (FLEXERIL) 5 MG tablet Take 5 mg by mouth 3 (three) times daily as needed for muscle spasms.   Yes Historical Provider, MD  ibuprofen (ADVIL,MOTRIN) 200 MG tablet Take 600 mg by mouth every 6 (six) hours as needed for mild pain or moderate pain.   Yes Historical Provider, MD  omeprazole (PRILOSEC) 20 MG  capsule Take 20 mg by mouth daily.   Yes Historical Provider, MD  ondansetron (ZOFRAN ODT) 4 MG disintegrating tablet 4mg  ODT q4 hours prn nausea/vomit 06/13/15  Yes Benjamin Cartner, PA-C  promethazine (PHENERGAN) 12.5 MG tablet Take 1 tablet (12.5 mg total) by mouth daily as needed. Patient taking differently: Take 12.5 mg by mouth daily as needed for nausea or vomiting.  06/14/15  Yes Gerhard Munch, MD  sertraline (ZOLOFT) 100 MG tablet Take 100 mg by mouth daily. 05/16/15  Yes Historical Provider, MD   BP 169/94 mmHg  Pulse 68  Temp(Src) 98.3 F (36.8 C) (Oral)  Resp 16  SpO2 99%  LMP 05/23/2015 Physical Exam  Constitutional:  She is oriented to person, place, and time. She appears well-developed and well-nourished. No distress.  Young female, non-toxic appearing, appears uncomfortable, curled into fetal position in ER gurney, rocking and shaking  HENT:  Head: Normocephalic and atraumatic.  Nose: Nose normal.  Mouth/Throat: Oropharynx is clear and moist. No oropharyngeal exudate.  Eyes: Conjunctivae and EOM are normal. Pupils are equal, round, and reactive to light. Right eye exhibits no discharge. Left eye exhibits no discharge. No scleral icterus.  Neck: Normal range of motion. No JVD present. No tracheal deviation present. No thyromegaly present.  Cardiovascular: Normal rate, regular rhythm, normal heart sounds and intact distal pulses.  Exam reveals no gallop and no friction rub.   No murmur heard. Pulmonary/Chest: Effort normal and breath sounds normal. No respiratory distress. She has no wheezes. She has no rales. She exhibits no tenderness.  Abdominal: Soft. Bowel sounds are normal. She exhibits no distension and no mass. There is tenderness. There is guarding. There is no rebound.  Generalized abdominal tenderness to palpation, no rebound, mild guarding.  Midline abdominal incision with healthy granulation tissue, mild discharge on dressing, no surrounding erythema or induration.  Musculoskeletal: Normal range of motion. She exhibits no edema or tenderness.  Lymphadenopathy:    She has no cervical adenopathy.  Neurological: She is alert and oriented to person, place, and time. She has normal reflexes. She displays tremor. No cranial nerve deficit. She exhibits normal muscle tone. She displays no seizure activity. Coordination normal.  Skin: Skin is warm and dry. No rash noted. She is not diaphoretic. No erythema. No pallor.  Psychiatric: She has a normal mood and affect. Her behavior is normal. Judgment and thought content normal.  Nursing note and vitals reviewed.      ED Course  Procedures (including  critical care time) Labs Review Labs Reviewed  CBC WITH DIFFERENTIAL/PLATELET - Abnormal; Notable for the following:    Neutrophils Relative % 86 (*)    Neutro Abs 8.0 (*)    Lymphocytes Relative 11 (*)    All other components within normal limits  COMPREHENSIVE METABOLIC PANEL - Abnormal; Notable for the following:    Glucose, Bld 109 (*)    AST 46 (*)    All other components within normal limits  URINALYSIS, ROUTINE W REFLEX MICROSCOPIC (NOT AT [facility]) - Abnormal; Notable for the following:    APPearance CLOUDY (*)    Ketones, ur 15 (*)    All other components within normal limits  LIPASE, BLOOD  URINE RAPID DRUG SCREEN, HOSP PERFORMED    Imaging Review Ct Abdomen Pelvis W Contrast  06/13/2015   CLINICAL DATA:  Acute onset of generalized abdominal pain, nausea and vomiting. Diarrhea. Recent surgery for appendicitis. Initial encounter.  EXAM: CT ABDOMEN AND PELVIS WITH CONTRAST  TECHNIQUE: Multidetector CT imaging of  the abdomen and pelvis was performed using the standard protocol following bolus administration of intravenous contrast.  CONTRAST:  OMNIPAQUE IOHEXOL 300 MG/ML  SOLN  COMPARISON:  CT of the abdomen and pelvis from 05/25/2015  FINDINGS: The visualized lung bases are clear.  The liver and spleen are unremarkable in appearance. The gallbladder is within normal limits. The pancreas and adrenal glands are unremarkable.  The kidneys are unremarkable in appearance. There is no evidence of hydronephrosis. No renal or ureteral stones are seen. No perinephric stranding is appreciated.  No free fluid is identified. The small bowel is unremarkable in appearance. The stomach is within normal limits. No acute vascular abnormalities are seen.  The patient is status post appendectomy, with a suture line at the cecum and about the ileocecal junction. There is mild residual ileal wall thickening at the site of surgery. Minimal stranding is seen tracking superiorly, likely postoperative in  nature. The colon is entirely decompressed and unremarkable in appearance.  Postoperative stranding is noted along the anterior midline abdominal wall, with trace associated fluid but no evidence of abscess.  The bladder is mildly distended and grossly unremarkable. The uterus is within normal limits. The ovaries are grossly symmetric, with minimal residual stranding noted at the upper pelvis and tracking about the right ovary. No suspicious adnexal masses are seen. No inguinal lymphadenopathy is seen.  No acute osseous abnormalities are identified.  IMPRESSION: 1. No acute abnormality seen to explain the patient's symptoms. 2. Status post appendectomy, with minimal residual stranding tracking about the site of surgery and upper pelvis. Mild residual ileal wall thickening at the site of surgery. This is likely within normal limits given recent surgery. 3. Postoperative stranding along the anterior abdominal wall, with trace associated postoperative fluid but no evidence of abscess.   Electronically Signed   By: Roanna Raider M.D.   On: 06/13/2015 21:35   Dg Abd Acute W/chest  06/14/2015   CLINICAL DATA:  Nausea and vomiting  EXAM: DG ABDOMEN ACUTE W/ 1V CHEST  COMPARISON:  None.  FINDINGS: Cardiac shadow is within normal limits. The lungs are clear bilaterally. Scattered large and small bowel gas is noted. No free air is noted. Postsurgical changes are seen in the right lower quadrant consistent with the patient's given clinical history. No obstructive changes are seen.  IMPRESSION: No acute abnormality noted.   Electronically Signed   By: Alcide Clever M.D.   On: 06/14/2015 19:55   I, [name], personally reviewed and evaluated these images and lab results as part of my medical decision-making.   EKG Interpretation None      MDM   Final diagnoses:  None  Patient presents with generalized abdominal pain and vomiting, unable to control at home with Phenergan and pain meds. In the past 2 days she  has received a CT abdomen and acute abdomen films  Patient vomiting which was controlled with Zofran and Phenergan, she continues to ask for pain meds, labs are unremarkable, her incision is healing nicely with granulation tissue and no surrounding edema or erythema.  She does not appear toxic, has no peritoneal signs, is afebrile and has not been tachycardic. Given recent visits and results, do not feel any further imaging is warranted at this time.  Patient states she has oxycodone 15 mg available to her at home, denies that she may be withdrawing from chronic narcotic use, though when checking the [place] controlled substance database, pt  Her vitals have been stable throughout her time  in the ER.   She has been able to tolerate PO's while in the ED. Return precautions were given.  Pt was instructed to seek follow up with surgery, take her available pain meds and she was given suppository Phenergan.  Patient was seen and evaluated in a shared visit with Dr. Donnald Garre. She was discharged in stable condition. Medications  ondansetron (ZOFRAN-ODT) disintegrating tablet 4 mg (4 mg Oral Given 06/15/15 1113)  sodium chloride 0.9 % bolus 1,000 mL (0 mLs Intravenous Stopped 06/15/15 1422)  HYDROmorphone (DILAUDID) injection 1 mg (1 mg Intravenous Given 06/15/15 1230)  promethazine (PHENERGAN) injection 25 mg (25 mg Intravenous Given 06/15/15 1258)  metoCLOPramide (REGLAN) injection 10 mg (10 mg Intravenous Given 06/15/15 1424)  diphenhydrAMINE (BENADRYL) injection 25 mg (25 mg Intravenous Given 06/15/15 1423)  0.9 %  sodium chloride infusion (0 mLs Intravenous Stopped 06/15/15 1534)  gi cocktail (Maalox,Lidocaine,Donnatal) (30 mLs Oral Given 06/15/15 1625)  diazepam (VALIUM) injection 2.5 mg (2.5 mg Intravenous Given 06/15/15 1625)  pantoprazole (PROTONIX) injection 40 mg (40 mg Intravenous Given 06/15/15 1626)  ondansetron (ZOFRAN) injection 4 mg (4 mg Intravenous Given 06/15/15 1627)   Filed Vitals:   06/15/15  1500 06/15/15 1630 06/15/15 1639 06/15/15 1700  BP: 169/94 148/73 148/73 132/78  Pulse: 68 75 71 90  Temp:   98.5 F (36.9 C)   TempSrc:   Oral   Resp: 16  16   SpO2: 99% 96% 97% 97%       Yvonne Berry, PA-C 06/19/15 1610  Arby Barrette, MD 06/19/15 [phone]

## 2015-06-15 NOTE — Discharge Instructions (Signed)

## 2015-06-18 ENCOUNTER — Encounter (HOSPITAL_COMMUNITY): Payer: Self-pay | Admitting: Emergency Medicine

## 2015-06-18 ENCOUNTER — Emergency Department (HOSPITAL_COMMUNITY): Payer: Medicaid Other

## 2015-06-18 ENCOUNTER — Emergency Department (HOSPITAL_COMMUNITY)
Admission: EM | Admit: 2015-06-18 | Discharge: 2015-06-18 | Disposition: A | Discharge status: Home / Self Care | Payer: Medicaid Other | Attending: Emergency Medicine | Admitting: Emergency Medicine

## 2015-06-18 DIAGNOSIS — Z79899 Other long term (current) drug therapy: Secondary | ICD-10-CM | POA: Diagnosis not present

## 2015-06-18 DIAGNOSIS — R1011 Right upper quadrant pain: Secondary | ICD-10-CM | POA: Diagnosis not present

## 2015-06-18 DIAGNOSIS — E669 Obesity, unspecified: Secondary | ICD-10-CM | POA: Diagnosis not present

## 2015-06-18 DIAGNOSIS — M545 Low back pain: Secondary | ICD-10-CM | POA: Insufficient documentation

## 2015-06-18 DIAGNOSIS — K219 Gastro-esophageal reflux disease without esophagitis: Secondary | ICD-10-CM | POA: Diagnosis not present

## 2015-06-18 DIAGNOSIS — R197 Diarrhea, unspecified: Secondary | ICD-10-CM | POA: Insufficient documentation

## 2015-06-18 DIAGNOSIS — R109 Unspecified abdominal pain: Secondary | ICD-10-CM

## 2015-06-18 DIAGNOSIS — Z3202 Encounter for pregnancy test, result negative: Secondary | ICD-10-CM | POA: Diagnosis not present

## 2015-06-18 DIAGNOSIS — R112 Nausea with vomiting, unspecified: Secondary | ICD-10-CM | POA: Diagnosis present

## 2015-06-18 LAB — CBC
HCT: 44.6 % (ref 36.0–46.0)
Hemoglobin: 15.3 g/dL — ABNORMAL HIGH (ref 12.0–15.0)
MCH: 27.8 pg (ref 26.0–34.0)
MCHC: 34.3 g/dL (ref 30.0–36.0)
MCV: 81.1 fL (ref 78.0–100.0)
Platelets: 331 10*3/uL (ref 150–400)
RBC: 5.5 MIL/uL — ABNORMAL HIGH (ref 3.87–5.11)
RDW: 14.2 % (ref 11.5–15.5)
WBC: 11 10*3/uL — ABNORMAL HIGH (ref 4.0–10.5)

## 2015-06-18 LAB — URINALYSIS, ROUTINE W REFLEX MICROSCOPIC
Bilirubin Urine: NEGATIVE
Glucose, UA: NEGATIVE mg/dL
Hgb urine dipstick: NEGATIVE
Ketones, ur: 15 mg/dL — AB
Nitrite: NEGATIVE
Protein, ur: 30 mg/dL — AB
Specific Gravity, Urine: 1.027 (ref 1.005–1.030)
Urobilinogen, UA: 1 mg/dL (ref 0.0–1.0)
pH: 6 (ref 5.0–8.0)

## 2015-06-18 LAB — COMPREHENSIVE METABOLIC PANEL
ALT: 40 U/L (ref 14–54)
AST: 29 U/L (ref 15–41)
Albumin: 4.4 g/dL (ref 3.5–5.0)
Alkaline Phosphatase: 74 U/L (ref 38–126)
Anion gap: 14 (ref 5–15)
BUN: 13 mg/dL (ref 6–20)
CO2: 29 mmol/L (ref 22–32)
Calcium: 10.3 mg/dL (ref 8.9–10.3)
Chloride: 91 mmol/L — ABNORMAL LOW (ref 101–111)
Creatinine, Ser: 0.86 mg/dL (ref 0.44–1.00)
GFR calc Af Amer: >60 mL/min (ref >60)
GFR calc non Af Amer: >60 mL/min (ref >60)
Glucose, Bld: 114 mg/dL — ABNORMAL HIGH (ref 65–99)
Potassium: 3.2 mmol/L — ABNORMAL LOW (ref 3.5–5.1)
Sodium: 134 mmol/L — ABNORMAL LOW (ref 135–145)
Total Bilirubin: 1 mg/dL (ref 0.3–1.2)
Total Protein: 8.7 g/dL — ABNORMAL HIGH (ref 6.5–8.1)

## 2015-06-18 LAB — LIPASE, BLOOD: Lipase: 30 U/L (ref 22–51)

## 2015-06-18 LAB — URINE MICROSCOPIC-ADD ON

## 2015-06-18 LAB — POC URINE PREG, ED: Preg Test, Ur: NEGATIVE

## 2015-06-18 MED ORDER — PROMETHAZINE HCL 25 MG/ML IJ SOLN
25.0000 mg | Freq: Once | INTRAMUSCULAR | Status: AC
  Administered 2015-06-18: 25 mg via INTRAVENOUS
  Filled 2015-06-18: qty 1

## 2015-06-18 MED ORDER — METOCLOPRAMIDE HCL 10 MG PO TABS: 10.0000 mg | ORAL_TABLET | Freq: Four times a day (QID) | ORAL | Status: DC

## 2015-06-18 MED ORDER — SODIUM CHLORIDE 0.9 % IV BOLUS (SEPSIS)
1000.0000 mL | Freq: Once | INTRAVENOUS | Status: AC
  Administered 2015-06-18: 1000 mL via INTRAVENOUS

## 2015-06-18 MED ORDER — ONDANSETRON HCL 4 MG/2ML IJ SOLN
4.0000 mg | Freq: Once | INTRAMUSCULAR | Status: AC
  Administered 2015-06-18: 4 mg via INTRAVENOUS
  Filled 2015-06-18: qty 2

## 2015-06-18 MED ORDER — HYDROMORPHONE HCL 1 MG/ML IJ SOLN
1.0000 mg | Freq: Once | INTRAMUSCULAR | Status: AC
  Administered 2015-06-18: 1 mg via INTRAVENOUS
  Filled 2015-06-18: qty 1

## 2015-06-18 MED ORDER — ONDANSETRON 4 MG PO TBDP
ORAL_TABLET | ORAL | Status: AC
  Filled 2015-06-18: qty 2

## 2015-06-18 MED ORDER — MORPHINE SULFATE (PF) 4 MG/ML IV SOLN
4.0000 mg | Freq: Once | INTRAVENOUS | Status: AC
  Administered 2015-06-18: 4 mg via INTRAVENOUS
  Filled 2015-06-18: qty 1

## 2015-06-18 MED ORDER — SODIUM CHLORIDE 0.9 % IV BOLUS (SEPSIS)
1000.0000 mL | Freq: Once | INTRAVENOUS | Status: AC
Start: 2015-06-18 — End: 2015-06-18
  Administered 2015-06-18: 1000 mL via INTRAVENOUS

## 2015-06-18 MED ORDER — METOCLOPRAMIDE HCL 5 MG/ML IJ SOLN
5.0000 mg | Freq: Once | INTRAMUSCULAR | Status: AC
  Administered 2015-06-18: 5 mg via INTRAVENOUS
  Filled 2015-06-18: qty 2

## 2015-06-18 MED ORDER — ONDANSETRON 4 MG PO TBDP
8.0000 mg | ORAL_TABLET | Freq: Once | ORAL | Status: AC
  Administered 2015-06-18: 8 mg via ORAL

## 2015-06-18 NOTE — ED Notes (Signed)
Patient transported to Ultrasound 

## 2015-06-18 NOTE — ED Provider Notes (Signed)
CSN: [id number]     Arrival date & time 06/18/15  0035 History   This chart was scribed for Shon Baton, MD by Arlan Organ, ED Scribe. This patient was seen in room D32C/D32C and the patient's care was started 4:02 AM.   Chief Complaint  Patient presents with  . Emesis  . Diarrhea   The history is provided by the patient. No language interpreter was used.    HPI Comments: [name] is a 29 y.o. female without any pertinent past medical history who presents to the Emergency Department complaining of constant, ongoing diffuseabdominal pain that radiates to the back x 3 days. Pain is described as sharp and currently rated 10/10. No alleviating or aggravating factors at this time. She also reports a fever of 102, nausea, vomiting, and diarrhea. No OTC medications or home remedies attempted prior to arrival. No recent chills, dysuria, or hematuria. Denies any known sick contacts at home. LNMP 7/26. PSHx includes appendectomy 05/25/2015. Pt with known allergy to Morphine.  Of note, this is the patient's fourth visit in the last week for the same. She's had a CT scan and multiple workups. At one point, patient's symptoms were thought to be due to ileus versus narcotic withdrawal. Patient is adamant that she is not withdrawing.  Past Medical History  Diagnosis Date  . GERD (gastroesophageal reflux disease)   . Foot pain   . Gastritis [id number]  . Seasonal allergies    Past Surgical History  Procedure Laterality Date  . Foot surgery    . Wrist surgery    . Breast biopsy Right 05/03/13  . Appendectomy N/A 05/25/2015    Procedure: APPENDECTOMY;  Surgeon: Manus Rudd, MD;  Location: [facility] OR;  Service: General;  Laterality: N/A;   Family History  Problem Relation Age of Onset  . Hypertension Father   . Diabetes Father   . Liver cancer Paternal Grandfather   . Diabetes Maternal Grandmother   . Kidney failure Maternal Grandmother    Social History  Substance Use Topics  . Smoking  status: Never Smoker   . Smokeless tobacco: Never Used  . Alcohol Use: Yes     Comment: occ   OB History    Gravida Para Term Preterm AB TAB SAB Ectopic Multiple Living   2 1        1       Obstetric Comments   1st Menstrual Cycle:  12 1st Pregnancy: 20     Review of Systems  Constitutional: Positive for fever. Negative for chills.  Respiratory: Negative for cough and shortness of breath.   Gastrointestinal: Positive for nausea, vomiting, abdominal pain and diarrhea.  Genitourinary: Negative for dysuria and hematuria.  Musculoskeletal: Positive for back pain.  Neurological: Negative for headaches.  Psychiatric/Behavioral: Negative for confusion.  All other systems reviewed and are negative.     Allergies  Morphine and related  Home Medications   Prior to Admission medications   Medication Sig Start Date End Date Taking? Authorizing Provider  cyclobenzaprine (FLEXERIL) 5 MG tablet Take 5 mg by mouth 3 (three) times daily as needed for muscle spasms.   Yes Historical Provider, MD  fexofenadine (ALLEGRA) 180 MG tablet Take 180 mg by mouth daily as needed for allergies or rhinitis.   Yes Historical Provider, MD  omeprazole (PRILOSEC) 20 MG capsule Take 20 mg by mouth daily.   Yes Historical Provider, MD  ondansetron (ZOFRAN ODT) 4 MG disintegrating tablet 4mg  ODT q4 hours prn nausea/vomit Patient taking  differently: Take 4 mg by mouth every 4 (four) hours as needed for nausea or vomiting.  ODT q4 hours prn nausea/vomit 06/13/15  Yes Benjamin Cartner, PA-C  oxyCODONE (ROXICODONE) 15 MG immediate release tablet Take 15 mg by mouth every 4 (four) hours as needed for pain.   Yes Historical Provider, MD  promethazine (PHENERGAN) 25 MG suppository Place 1 suppository (25 mg total) rectally every 6 (six) hours as needed for nausea or vomiting. 06/15/15  Yes Danelle Berry, PA-C  metoCLOPramide (REGLAN) 10 MG tablet Take 1 tablet (10 mg total) by mouth every 6 (six) hours. 06/18/15   Elpidio Anis, PA-C   Triage Vitals: BP 106/61 mmHg  Pulse 62  Temp(Src) 97.9 F (36.6 C) (Oral)  Resp 14  SpO2 100%  LMP 05/23/2015   Physical Exam  Constitutional: She is oriented to person, place, and time. No distress.  Obese  HENT:  Head: Normocephalic and atraumatic.  Cardiovascular: Normal rate, regular rhythm and normal heart sounds.   No murmur heard. Pulmonary/Chest: Effort normal and breath sounds normal. No respiratory distress. She has no wheezes.  Abdominal: Soft. Bowel sounds are normal. There is tenderness. There is no rebound and no guarding.  Well-healing midline abdominal incision with granulation tissue, no drainage Tenderness to palpation right upper quadrant without rebound or guarding  Neurological: She is alert and oriented to person, place, and time.  Skin: Skin is warm and dry.  Psychiatric: She has a normal mood and affect.  Nursing note and vitals reviewed.   ED Course  Procedures (including critical care time)  DIAGNOSTIC STUDIES: Oxygen Saturation is 100% on RA, Normal by my interpretation.    COORDINATION OF CARE: 3:59 AM- Will order lipase, CMP, CBC, urinalysis, urine pregnancy. Will give Zofran. Discussed treatment plan with pt at bedside and pt agreed to plan.     Labs Review Labs Reviewed  COMPREHENSIVE METABOLIC PANEL - Abnormal; Notable for the following:    Sodium 134 (*)    Potassium 3.2 (*)    Chloride 91 (*)    Glucose, Bld 114 (*)    Total Protein 8.7 (*)    All other components within normal limits  CBC - Abnormal; Notable for the following:    WBC 11.0 (*)    RBC 5.50 (*)    Hemoglobin 15.3 (*)    All other components within normal limits  URINALYSIS, ROUTINE W REFLEX MICROSCOPIC (NOT AT [facility]) - Abnormal; Notable for the following:    Color, Urine AMBER (*)    APPearance CLOUDY (*)    Ketones, ur 15 (*)    Protein, ur 30 (*)    Leukocytes, UA SMALL (*)    All other components within normal limits  URINE MICROSCOPIC-ADD ON  - Abnormal; Notable for the following:    Casts HYALINE CASTS (*)    Crystals CA OXALATE CRYSTALS (*)    All other components within normal limits  LIPASE, BLOOD  POC URINE PREG, ED    Imaging Review US Abdomen Limited Ruq  06/18/2015   CLINICAL DATA:  Abdominal pain with nausea and vomiting for 1 week  EXAM: US ABDOMEN LIMITED - RIGHT UPPER QUADRANT  COMPARISON:  11/26/2013  FINDINGS: Gallbladder:  No gallstones or wall thickening visualized. No sonographic Murphy sign noted.  Common bile duct:  Diameter: 2 mm  Liver:  No focal lesion identified. Within normal limits in parenchymal echogenicity. Antegrade flow in the imaged portal venous system  IMPRESSION: Normal right upper quadrant ultrasound  Electronically Signed   By: Marnee Spring M.D.   On: 06/18/2015 05:58   I have personally reviewed and evaluated these images and lab results as part of my medical decision-making.   EKG Interpretation None      MDM   Final diagnoses:  Abdominal pain    Patient presents with abdominal pain. This is her fourth visit in one week. Recent appendectomy. She has had imaging and multiple workups. Patient was given pain and nausea medication as well as fluids. Initial workup notable for 15 ketones in the urine and CBC and BMP to reflect most likely dehydration with some hemoconcentration and mild hypochloremia/hypokalemia.  Right upper quadrant ultrasound obtained and negative. Patient initially given morphine which she states does not help. She is on pain management. Patient's family is at the bedside and they're very angry and requesting to "speak to the person in charge."  Again I spoke with the patient and her family regarding her workups. There has previously been some concern for narcotic withdrawal and/or ileus.  Patient denies both.  After 2 L of fluid and pain and nausea medication patient is able to tolerate fluids. I do not feel she needs inpatient admission as she only appears mildly  dehydrated. Patient will be given referral back to her primary physician, GI, and patient was also instructed to follow-up with surgery given her recent appendectomy. She will not be given any narcotic pain medications at discharge. She was given Reglan for possible gastroparesis or slow motility.  After history, exam, and medical workup I feel the patient has been appropriately medically screened and is safe for discharge home. Pertinent diagnoses were discussed with the patient. Patient was given return precautions.  I personally performed the services described in this documentation, which was scribed in my presence. The recorded information has been reviewed and is accurate.   Shon Baton, MD 06/19/15 [phone]

## 2015-06-18 NOTE — ED Notes (Signed)
Pt. reports persistent emesis and diarrhea onset last week unrelieved by prescription antiemetic , denies fever or chills. Pt. stated appendectomy last month by Dr. Corliss Skains .

## 2015-06-18 NOTE — ED Notes (Signed)
PO challenge started, pt stated she is not able to drink the fluids, she still on pain and is requesting for pain medication.

## 2015-06-18 NOTE — Discharge Instructions (Signed)

## 2015-06-18 NOTE — ED Notes (Signed)
Transport came to pick up patient, delayed as patient requests nausea medication prior to transport.

## 2015-06-18 NOTE — ED Notes (Signed)
[place] machine at the bedside.

## 2015-06-29 ENCOUNTER — Emergency Department: Payer: Medicaid Other

## 2015-06-29 ENCOUNTER — Encounter: Payer: Self-pay | Admitting: *Deleted

## 2015-06-29 ENCOUNTER — Emergency Department
Admission: EM | Admit: 2015-06-29 | Discharge: 2015-06-29 | Disposition: A | Discharge status: Home / Self Care | Payer: Medicaid Other | Attending: Emergency Medicine | Admitting: Emergency Medicine

## 2015-06-29 DIAGNOSIS — R1084 Generalized abdominal pain: Secondary | ICD-10-CM | POA: Insufficient documentation

## 2015-06-29 DIAGNOSIS — G8918 Other acute postprocedural pain: Secondary | ICD-10-CM | POA: Insufficient documentation

## 2015-06-29 DIAGNOSIS — R1110 Vomiting, unspecified: Secondary | ICD-10-CM | POA: Diagnosis present

## 2015-06-29 DIAGNOSIS — Z9049 Acquired absence of other specified parts of digestive tract: Secondary | ICD-10-CM | POA: Insufficient documentation

## 2015-06-29 DIAGNOSIS — R109 Unspecified abdominal pain: Secondary | ICD-10-CM

## 2015-06-29 DIAGNOSIS — Z3202 Encounter for pregnancy test, result negative: Secondary | ICD-10-CM | POA: Insufficient documentation

## 2015-06-29 DIAGNOSIS — Z79899 Other long term (current) drug therapy: Secondary | ICD-10-CM | POA: Insufficient documentation

## 2015-06-29 LAB — COMPREHENSIVE METABOLIC PANEL
ALT: 28 U/L (ref 14–54)
AST: 30 U/L (ref 15–41)
Albumin: 4.9 g/dL (ref 3.5–5.0)
Alkaline Phosphatase: 76 U/L (ref 38–126)
Anion gap: 13 (ref 5–15)
BUN: 18 mg/dL (ref 6–20)
CO2: 26 mmol/L (ref 22–32)
Calcium: 9.9 mg/dL (ref 8.9–10.3)
Chloride: 95 mmol/L — ABNORMAL LOW (ref 101–111)
Creatinine, Ser: 1.19 mg/dL — ABNORMAL HIGH (ref 0.44–1.00)
GFR calc Af Amer: >60 mL/min (ref >60)
GFR calc non Af Amer: >60 mL/min (ref >60)
Glucose, Bld: 142 mg/dL — ABNORMAL HIGH (ref 65–99)
Potassium: 2.9 mmol/L — CL (ref 3.5–5.1)
Sodium: 134 mmol/L — ABNORMAL LOW (ref 135–145)
Total Bilirubin: 1.1 mg/dL (ref 0.3–1.2)
Total Protein: 8.6 g/dL — ABNORMAL HIGH (ref 6.5–8.1)

## 2015-06-29 LAB — CBC WITH DIFFERENTIAL/PLATELET
Basophils Absolute: 0 10*3/uL (ref 0–0.1)
Basophils Relative: 0 %
Eosinophils Absolute: 0.1 10*3/uL (ref 0–0.7)
Eosinophils Relative: 1 %
HCT: 45.1 % (ref 35.0–47.0)
Hemoglobin: 15.3 g/dL (ref 12.0–16.0)
Lymphocytes Relative: 12 %
Lymphs Abs: 1.5 10*3/uL (ref 1.0–3.6)
MCH: 27.3 pg (ref 26.0–34.0)
MCHC: 33.9 g/dL (ref 32.0–36.0)
MCV: 80.7 fL (ref 80.0–100.0)
Monocytes Absolute: 0.8 10*3/uL (ref 0.2–0.9)
Monocytes Relative: 6 %
Neutro Abs: 10.3 10*3/uL — ABNORMAL HIGH (ref 1.4–6.5)
Neutrophils Relative %: 81 %
Platelets: 284 10*3/uL (ref 150–440)
RBC: 5.59 MIL/uL — ABNORMAL HIGH (ref 3.80–5.20)
RDW: 16 % — ABNORMAL HIGH (ref 11.5–14.5)
WBC: 12.8 10*3/uL — ABNORMAL HIGH (ref 3.6–11.0)

## 2015-06-29 LAB — URINALYSIS COMPLETE WITH MICROSCOPIC (ARMC ONLY)
Bacteria, UA: NONE SEEN
Bilirubin Urine: NEGATIVE
Glucose, UA: NEGATIVE mg/dL
Hgb urine dipstick: NEGATIVE
Ketones, ur: NEGATIVE mg/dL
Leukocytes, UA: NEGATIVE
Nitrite: NEGATIVE
Protein, ur: 30 mg/dL — AB
Specific Gravity, Urine: 1.024 (ref 1.005–1.030)
WBC, UA: NONE SEEN WBC/hpf (ref 0–5)
pH: 6 (ref 5.0–8.0)

## 2015-06-29 LAB — LIPASE, BLOOD: Lipase: 29 U/L (ref 22–51)

## 2015-06-29 MED ORDER — HYDROMORPHONE HCL 1 MG/ML IJ SOLN
1.0000 mg | Freq: Once | INTRAMUSCULAR | Status: AC
  Administered 2015-06-29: 1 mg via INTRAVENOUS
  Filled 2015-06-29: qty 1

## 2015-06-29 MED ORDER — PROMETHAZINE HCL 12.5 MG PO TABS: 12.5000 mg | ORAL_TABLET | Freq: Four times a day (QID) | ORAL | Status: DC | PRN

## 2015-06-29 MED ORDER — POTASSIUM CHLORIDE CRYS ER 20 MEQ PO TBCR
20.0000 meq | EXTENDED_RELEASE_TABLET | Freq: Once | ORAL | Status: AC
  Administered 2015-06-29: 20 meq via ORAL
  Filled 2015-06-29: qty 1

## 2015-06-29 MED ORDER — SODIUM CHLORIDE 0.9 % IV BOLUS (SEPSIS)
500.0000 mL | Freq: Once | INTRAVENOUS | Status: AC
  Administered 2015-06-29: 500 mL via INTRAVENOUS

## 2015-06-29 MED ORDER — HYDROMORPHONE HCL 2 MG PO TABS: 2.0000 mg | ORAL_TABLET | Freq: Four times a day (QID) | ORAL | Status: AC | PRN

## 2015-06-29 MED ORDER — PROMETHAZINE HCL 25 MG/ML IJ SOLN
6.2500 mg | Freq: Once | INTRAMUSCULAR | Status: AC
  Administered 2015-06-29: 6.25 mg via INTRAVENOUS
  Filled 2015-06-29: qty 1

## 2015-06-29 MED ORDER — PROMETHAZINE HCL 25 MG RE SUPP: 25.0000 mg | Freq: Four times a day (QID) | RECTAL | Status: DC | PRN

## 2015-06-29 NOTE — ED Notes (Signed)
Pt reports nausea and vomiting since Thursday, pt vomiting in triage, pt has appendectomy in July and reports having vomiting since

## 2015-06-29 NOTE — Discharge Instructions (Signed)
Abdominal Pain Many things can cause abdominal pain. Usually, abdominal pain is not caused by a disease and will improve without treatment. It can often be observed and treated at home. Your health care provider will do a physical exam and possibly order blood tests and X-rays to help determine the seriousness of your pain. However, in many cases, more time must pass before a clear cause of the pain can be found. Before that point, your health care provider may not know if you need more testing or further treatment. HOME CARE INSTRUCTIONS  Monitor your abdominal pain for any changes. The following actions may help to alleviate any discomfort you are experiencing:  Only take over-the-counter or prescription medicines as directed by your health care provider.  Do not take laxatives unless directed to do so by your health care provider.  Try a clear liquid diet (broth, tea, or water) as directed by your health care provider. Slowly move to a bland diet as tolerated. SEEK MEDICAL CARE IF:  You have unexplained abdominal pain.  You have abdominal pain associated with nausea or diarrhea.  You have pain when you urinate or have a bowel movement.  You experience abdominal pain that wakes you in the night.  You have abdominal pain that is worsened or improved by eating food.  You have abdominal pain that is worsened with eating fatty foods.  You have a fever. SEEK IMMEDIATE MEDICAL CARE IF:   Your pain does not go away within 2 hours.  You keep throwing up (vomiting).  Your pain is felt only in portions of the abdomen, such as the right side or the left lower portion of the abdomen.  You pass bloody or black tarry stools. MAKE SURE YOU:  Understand these instructions.   Will watch your condition.   Will get help right away if you are not doing well or get worse.  Document Released: 07/28/2005 Document Revised: 10/23/2013 Document Reviewed: 06/27/2013 [facility] Patient Information  2015 [place], [place]. This information is not intended to replace advice given to you by your health care provider. Make sure you discuss any questions you have with your health care provider.  Please return immediately if condition worsens. Please contact her primary physician or the physician you were given for referral. If you have any specialist physicians involved in her treatment and plan please also contact them. Thank you for using [place] [facility]. Drink plenty of fluids. Please keep your appointment tomorrow and her leg emergency department visits to your surgeon.

## 2015-06-29 NOTE — ED Provider Notes (Addendum)
Time Seen: Approximately 1315  I have reviewed the triage notes  Chief Complaint: Emesis   History of Present Illness: [name] is a 29 y.o. female who presents with some diffuse abdominal pain primarily around the periumbilical region. Patient's been seen and evaluated here in the past and is also followed on an outpatient basis by her surgeons at [facility]. Patient's postoperative from an appendectomy with infection. Patient had a surgical wound that was healing by secondary intent. She states that she had a low-grade fever at home but took some oxycodone prior to arrival. She states she's had some nausea vomiting multiple times with no blood or bile. She's also had some periodic loose watery stool without any melena or hematochezia. She denies any persistent foul-smelling diarrhea such as C. difficile. She states that she's had no dysuria hematuria or urinary frequency.   Past Medical History  Diagnosis Date  . GERD (gastroesophageal reflux disease)   . Foot pain   . Gastritis 4098,1191  . Seasonal allergies     Patient Active Problem List   Diagnosis Date Noted  . Acute perforated appendicitis 05/25/2015  . Abdominal pain, left upper quadrant 02/21/2014  . Splenomegaly 02/21/2014  . Generalized anxiety disorder 02/21/2014  . Hemorrhage of rectum and anus 02/21/2014  . Lump or mass in breast 05/01/2013    Past Surgical History  Procedure Laterality Date  . Foot surgery    . Wrist surgery    . Breast biopsy Right 05/03/13  . Appendectomy N/A 05/25/2015    Procedure: APPENDECTOMY;  Surgeon: Manus Rudd, MD;  Location: [facility] OR;  Service: General;  Laterality: N/A;    Past Surgical History  Procedure Laterality Date  . Foot surgery    . Wrist surgery    . Breast biopsy Right 05/03/13  . Appendectomy N/A 05/25/2015    Procedure: APPENDECTOMY;  Surgeon: Manus Rudd, MD;  Location: [facility] OR;  Service: General;  Laterality: N/A;    Current Outpatient Rx  Name  Route   Sig  Dispense  Refill  . cyclobenzaprine (FLEXERIL) 5 MG tablet   Oral   Take 5 mg by mouth 3 (three) times daily as needed for muscle spasms.         . fexofenadine (ALLEGRA) 180 MG tablet   Oral   Take 180 mg by mouth daily as needed for allergies or rhinitis.         Marland Kitchen metoCLOPramide (REGLAN) 10 MG tablet   Oral   Take 1 tablet (10 mg total) by mouth every 6 (six) hours.   30 tablet   0   . omeprazole (PRILOSEC) 20 MG capsule   Oral   Take 20 mg by mouth daily.         . ondansetron (ZOFRAN ODT) 4 MG disintegrating tablet      4mg  ODT q4 hours prn nausea/vomit Patient taking differently: Take 4 mg by mouth every 4 (four) hours as needed for nausea or vomiting. 4mg  ODT q4 hours prn nausea/vomit   12 tablet   0   . oxyCODONE (ROXICODONE) 15 MG immediate release tablet   Oral   Take 15 mg by mouth every 4 (four) hours as needed for pain.         . promethazine (PHENERGAN) 25 MG suppository   Rectal   Place 1 suppository (25 mg total) rectally every 6 (six) hours as needed for nausea or vomiting.   12 each   0     Allergies:  Morphine and related  Family History: Family History  Problem Relation Age of Onset  . Hypertension Father   . Diabetes Father   . Liver cancer Paternal Grandfather   . Diabetes Maternal Grandmother   . Kidney failure Maternal Grandmother     Social History: Social History  Substance Use Topics  . Smoking status: Never Smoker   . Smokeless tobacco: Never Used  . Alcohol Use: Yes     Comment: occ     Review of Systems:   10 point review of systems was performed and was otherwise negative:  Constitutional: Stable low-grade fever at home Eyes: No visual disturbances ENT: No sore throat, ear pain Cardiac: No chest pain Respiratory: No shortness of breath, wheezing, or stridor Abdomen:  Abdominal pain is. Umbilical without any obvious lateralization. She denies any radiation to the back or flank area. Endocrine: No weight  loss, No night sweats Extremities: No peripheral edema, cyanosis Skin: No rashes, easy bruising Neurologic: No focal weakness, trouble with speech or swollowing Urologic: No dysuria, Hematuria, or urinary frequency   Physical Exam:  ED Triage Vitals  Enc Vitals Group     BP 06/29/15 1037 87/59 mmHg     Pulse Rate 06/29/15 1037 122     Resp 06/29/15 1037 18     Temp 06/29/15 1037 98.1 F (36.7 C)     Temp Source 06/29/15 1037 Oral     SpO2 06/29/15 1037 100 %     Weight 06/29/15 1037 210 lb (95.255 kg)     Height 06/29/15 1037 5\' 6"  (1.676 m)     Head Cir --      Peak Flow --      Pain Score 06/29/15 1038 10     Pain Loc --      Pain Edu? --      Excl. in GC? --     General: Awake , Alert , and Oriented times 3; GCS 15 Head: Normal cephalic , atraumatic Eyes: Pupils equal , round, reactive to light Nose/Throat: No nasal drainage, patent upper airway without erythema or exudate.  Neck: Supple, Full range of motion, No anterior adenopathy or palpable thyroid masses Lungs: Clear to ascultation without wheezes , rhonchi, or rales Heart: Regular rate, regular rhythm without murmurs , gallops , or rubs Abdomen: Patient has a postoperative 1 and appears to be healing by secondary intent and examination of the area around the wound does not show any induration or erythema. Sounds are positive and symmetric in all 4 quadrants without any peritoneal signs..        Extremities: 2 plus symmetric pulses. No edema, clubbing or cyanosis Neurologic: normal ambulation, Motor symmetric without deficits, sensory intact Skin: warm, dry, no rashes   Labs:   All laboratory work was reviewed including any pertinent negatives or positives listed below:  Labs Reviewed  CBC WITH DIFFERENTIAL/PLATELET - Abnormal; Notable for the following:    WBC 12.8 (*)    RBC 5.59 (*)    RDW 16.0 (*)    Neutro Abs 10.3 (*)    All other components within normal limits  COMPREHENSIVE METABOLIC PANEL -  Abnormal; Notable for the following:    Sodium 134 (*)    Potassium 2.9 (*)    Chloride 95 (*)    Glucose, Bld 142 (*)    Creatinine, Ser 1.19 (*)    Total Protein 8.6 (*)    All other components within normal limits  URINALYSIS COMPLETEWITH MICROSCOPIC ([facility] ONLY) - Abnormal;  Notable for the following:    Color, Urine YELLOW (*)    APPearance CLOUDY (*)    Protein, ur 30 (*)    Squamous Epithelial / LPF 0-5 (*)    All other components within normal limits  LIPASE, BLOOD  POC URINE PREG, ED   Patient appears to have a elevated white blood cell count with a slight shift along with hypokalemia.  EKG: ED ECG REPORT I, Jennye Moccasin, the attending physician, personally viewed and interpreted this ECG.  Date: 06/29/2015 EKG Time: 1056 Rate: 107 Rhythm: normal sinus tachycardia QRS Axis: normal Intervals: normal ST/T Wave abnormalities: normal Conduction Disutrbances: none Narrative Interpretation: unremarkable  Radiology: DG ABDOMEN ACUTE W/ 1V CHEST  COMPARISON: 06/14/2015  FINDINGS: Suture line noted in the right lower quadrant. No evidence of bowel obstruction. No free air. No organomegaly or suspicious calcification.  Heart and mediastinal contours are within normal limits. No focal opacities or effusions. No acute bony abnormality.  IMPRESSION: No evidence of bowel obstruction or free air.  No active cardiopulmonary disease.     I personally reviewed the radiologic studies      ED Course: Patient was given IV fluids and pain control here in emergency department she is hemodynamically stable. Her white blood cell count slightly elevated but again she is afebrile here in emergency department. Denies follow-up appointment with her general surgeon tomorrow and I felt she could maintain that appointment and is a variety of tests that could be ordered at this point. I was hesitant to order another CAT scan since patient had been scanned on the 12th and that  would be a significant amount of radiation. Patient will be prescribed Dilaudid and Phenergan on an outpatient basis. Flow which is likely due to her history of persistent vomiting. She was able tolerate the oral potassium here in emergency department.  Assessment: Postoperative abdominal pain Final Clinical Impression: Postoperative abdominal pain Final diagnoses:  None     Plan: Patient was advised to return immediately if condition worsens. Patient was advised to follow up with her primary care physician or other specialized physicians involved and in their current assessment.      Patient was reexamined and had no peritoneal signs.       Jennye Moccasin, MD 06/29/15 1722  Jennye Moccasin, MD 06/29/15 1728

## 2015-06-29 NOTE — ED Notes (Signed)
CRITICAL VALUE ALERT  Critical value received:  K 2.9  Date of notification:  06/29/2015  Time of notification:  1227  Critical value read back:Yes.    Nurse who received alert:  Art therapist  MD notified (1st page):  MD Huel Cote  Time of first page:  1227  MD notified (2nd page):  Time of second page:  Responding MD:  MD Huel Cote  Time MD responded:  [id number]

## 2015-07-29 ENCOUNTER — Emergency Department
Admission: EM | Admit: 2015-07-29 | Discharge: 2015-07-29 | Disposition: A | Discharge status: Home / Self Care | Payer: Medicaid Other | Attending: Emergency Medicine | Admitting: Emergency Medicine

## 2015-07-29 ENCOUNTER — Emergency Department: Payer: Medicaid Other

## 2015-07-29 ENCOUNTER — Encounter: Payer: Self-pay | Admitting: Emergency Medicine

## 2015-07-29 DIAGNOSIS — Z3202 Encounter for pregnancy test, result negative: Secondary | ICD-10-CM | POA: Diagnosis not present

## 2015-07-29 DIAGNOSIS — Z79899 Other long term (current) drug therapy: Secondary | ICD-10-CM | POA: Insufficient documentation

## 2015-07-29 DIAGNOSIS — R112 Nausea with vomiting, unspecified: Secondary | ICD-10-CM | POA: Diagnosis present

## 2015-07-29 DIAGNOSIS — R1012 Left upper quadrant pain: Secondary | ICD-10-CM | POA: Diagnosis not present

## 2015-07-29 LAB — COMPREHENSIVE METABOLIC PANEL
ALT: 39 U/L (ref 14–54)
AST: 35 U/L (ref 15–41)
Albumin: 4.5 g/dL (ref 3.5–5.0)
Alkaline Phosphatase: 61 U/L (ref 38–126)
Anion gap: 10 (ref 5–15)
BUN: 17 mg/dL (ref 6–20)
CO2: 26 mmol/L (ref 22–32)
Calcium: 9.7 mg/dL (ref 8.9–10.3)
Chloride: 98 mmol/L — ABNORMAL LOW (ref 101–111)
Creatinine, Ser: 0.78 mg/dL (ref 0.44–1.00)
GFR calc Af Amer: >60 mL/min (ref >60)
GFR calc non Af Amer: >60 mL/min (ref >60)
Glucose, Bld: 109 mg/dL — ABNORMAL HIGH (ref 65–99)
Potassium: 3.3 mmol/L — ABNORMAL LOW (ref 3.5–5.1)
Sodium: 134 mmol/L — ABNORMAL LOW (ref 135–145)
Total Bilirubin: 0.9 mg/dL (ref 0.3–1.2)
Total Protein: 8.2 g/dL — ABNORMAL HIGH (ref 6.5–8.1)

## 2015-07-29 LAB — URINALYSIS COMPLETE WITH MICROSCOPIC (ARMC ONLY)
Bacteria, UA: NONE SEEN
Bilirubin Urine: NEGATIVE
Glucose, UA: NEGATIVE mg/dL
Leukocytes, UA: NEGATIVE
Nitrite: NEGATIVE
Protein, ur: NEGATIVE mg/dL
Specific Gravity, Urine: 1.016 (ref 1.005–1.030)
pH: 7 (ref 5.0–8.0)

## 2015-07-29 LAB — CBC
HCT: 40.7 % (ref 35.0–47.0)
Hemoglobin: 13.8 g/dL (ref 12.0–16.0)
MCH: 27.2 pg (ref 26.0–34.0)
MCHC: 33.8 g/dL (ref 32.0–36.0)
MCV: 80.5 fL (ref 80.0–100.0)
Platelets: 313 10*3/uL (ref 150–440)
RBC: 5.05 MIL/uL (ref 3.80–5.20)
RDW: 15.3 % — ABNORMAL HIGH (ref 11.5–14.5)
WBC: 9.8 10*3/uL (ref 3.6–11.0)

## 2015-07-29 LAB — POCT PREGNANCY, URINE: Preg Test, Ur: NEGATIVE

## 2015-07-29 LAB — PREGNANCY, URINE: Preg Test, Ur: NEGATIVE

## 2015-07-29 LAB — LIPASE, BLOOD: Lipase: 27 U/L (ref 22–51)

## 2015-07-29 MED ORDER — SODIUM CHLORIDE 0.9 % IV BOLUS (SEPSIS)
1000.0000 mL | Freq: Once | INTRAVENOUS | Status: AC
  Administered 2015-07-29: 1000 mL via INTRAVENOUS

## 2015-07-29 MED ORDER — KETOROLAC TROMETHAMINE 30 MG/ML IJ SOLN: 30.0000 mg | Freq: Once | INTRAMUSCULAR | Status: DC

## 2015-07-29 MED ORDER — GI COCKTAIL ~~LOC~~
30.0000 mL | Freq: Once | ORAL | Status: AC
  Administered 2015-07-29: 30 mL via ORAL

## 2015-07-29 MED ORDER — ONDANSETRON HCL 4 MG/2ML IJ SOLN
4.0000 mg | Freq: Once | INTRAMUSCULAR | Status: AC
  Administered 2015-07-29: 4 mg via INTRAVENOUS

## 2015-07-29 MED ORDER — GI COCKTAIL ~~LOC~~
ORAL | Status: AC
  Filled 2015-07-29: qty 30

## 2015-07-29 MED ORDER — KETOROLAC TROMETHAMINE 30 MG/ML IJ SOLN
INTRAMUSCULAR | Status: AC
  Filled 2015-07-29: qty 1

## 2015-07-29 MED ORDER — ONDANSETRON HCL 4 MG/2ML IJ SOLN
INTRAMUSCULAR | Status: AC
  Filled 2015-07-29: qty 2

## 2015-07-29 MED ORDER — PROMETHAZINE HCL 25 MG/ML IJ SOLN
25.0000 mg | Freq: Once | INTRAMUSCULAR | Status: AC
  Administered 2015-07-29: 25 mg via INTRAVENOUS

## 2015-07-29 MED ORDER — PROMETHAZINE HCL 25 MG/ML IJ SOLN
25.0000 mg | Freq: Once | INTRAMUSCULAR | Status: DC
  Filled 2015-07-29: qty 1

## 2015-07-29 MED ORDER — SODIUM CHLORIDE 0.9 % IV BOLUS (SEPSIS): 1000.0000 mL | Freq: Once | INTRAVENOUS | Status: DC

## 2015-07-29 MED ORDER — KETOROLAC TROMETHAMINE 30 MG/ML IJ SOLN
30.0000 mg | Freq: Once | INTRAMUSCULAR | Status: AC
Start: 2015-07-29 — End: 2015-07-29
  Administered 2015-07-29: 30 mg via INTRAVENOUS

## 2015-07-29 MED ORDER — PROMETHAZINE HCL 25 MG/ML IJ SOLN
25.0000 mg | Freq: Once | INTRAMUSCULAR | Status: AC
  Administered 2015-07-29: 25 mg via INTRAVENOUS
  Filled 2015-07-29: qty 1

## 2015-07-29 NOTE — ED Notes (Signed)
Developed abd pain with nausea and vomiting since Sunday. Unsure of fever ..last time vomitied was prior to arrival

## 2015-07-29 NOTE — ED Provider Notes (Signed)
[facility] Emergency Department Provider Note REMINDER - THIS NOTE IS NOT A FINAL MEDICAL RECORD UNTIL IT IS SIGNED. UNTIL THEN, THE CONTENT BELOW MAY REFLECT INFORMATION FROM A DOCUMENTATION TEMPLATE, NOT THE ACTUAL PATIENT VISIT. ____________________________________________  Time seen: Approximately 1:10 PM  I have reviewed the triage vital signs and the nursing notes.   HISTORY  Chief Complaint Abdominal Pain    HPI [name] is a 29 y.o. female to history of a previous appendectomy, gastritis. She reports that she is having an episode of abdominal pain and that this is been occurring 5 or 6 times for her in the past 6 months since she had her appendix removed. She denies any fevers or chills, but she does report feeling nauseated and that she will begin start vomiting and once this occurs if she has a hard time getting her symptoms under control without IV medication. She denies any chest pain or trouble breathing. She denies pregnancy. She's not had a vaginal discharge or vaginal symptoms. She denies lower pelvic pain, but states she gets this severe crampy episodes of pain in the mid abdomen that come and go.     Past Medical History  Diagnosis Date  . GERD (gastroesophageal reflux disease)   . Foot pain   . Gastritis 4010,2725  . Seasonal allergies     Patient Active Problem List   Diagnosis Date Noted  . Acute perforated appendicitis 05/25/2015  . Abdominal pain, left upper quadrant 02/21/2014  . Splenomegaly 02/21/2014  . Generalized anxiety disorder 02/21/2014  . Hemorrhage of rectum and anus 02/21/2014  . Lump or mass in breast 05/01/2013    Past Surgical History  Procedure Laterality Date  . Foot surgery    . Wrist surgery    . Breast biopsy Right 05/03/13  . Appendectomy N/A 05/25/2015    Procedure: APPENDECTOMY;  Surgeon: Manus Rudd, MD;  Location: [facility] OR;  Service: General;  Laterality: N/A;    Current Outpatient Rx   Name  Route  Sig  Dispense  Refill  . cyclobenzaprine (FLEXERIL) 5 MG tablet   Oral   Take 5 mg by mouth 3 (three) times daily as needed for muscle spasms.         . fexofenadine (ALLEGRA) 180 MG tablet   Oral   Take 180 mg by mouth daily as needed for allergies or rhinitis.         Marland Kitchen metoCLOPramide (REGLAN) 10 MG tablet   Oral   Take 1 tablet (10 mg total) by mouth every 6 (six) hours.   30 tablet   0   . omeprazole (PRILOSEC) 20 MG capsule   Oral   Take 20 mg by mouth daily.         . ondansetron (ZOFRAN ODT) 4 MG disintegrating tablet       ODT q4 hours prn nausea/vomit Patient taking differently: Take 4 mg by mouth every 4 (four) hours as needed for nausea or vomiting.  ODT q4 hours prn nausea/vomit   12 tablet   0   . oxyCODONE (ROXICODONE) 15 MG immediate release tablet   Oral   Take 15 mg by mouth every 4 (four) hours as needed for pain.         . promethazine (PHENERGAN) 12.5 MG tablet   Oral   Take 1 tablet (12.5 mg total) by mouth every 6 (six) hours as needed for nausea or vomiting.   30 tablet   0   . promethazine (  PHENERGAN) 25 MG suppository   Rectal   Place 1 suppository (25 mg total) rectally every 6 (six) hours as needed for nausea or vomiting.   12 each   0     Allergies Morphine and related  Family History  Problem Relation Age of Onset  . Hypertension Father   . Diabetes Father   . Liver cancer Paternal Grandfather   . Diabetes Maternal Grandmother   . Kidney failure Maternal Grandmother     Social History Social History  Substance Use Topics  . Smoking status: Never Smoker   . Smokeless tobacco: Never Used  . Alcohol Use: Yes     Comment: occ    Review of Systems Constitutional: No fever/chills Eyes: No visual changes. ENT: No sore throat. Cardiovascular: Denies chest pain. Respiratory: Denies shortness of breath. Gastrointestinal: No diarrhea.  No constipation. Genitourinary: Negative for  dysuria. Musculoskeletal: Negative for back pain. Skin: Negative for rash. Neurological: Negative for headaches, focal weakness or numbness.  She does report she episode of Clostridium difficile, but this does not seem similar and she does not have any loose or watery diarrhea.  10-point ROS otherwise negative.  ____________________________________________   PHYSICAL EXAM:  VITAL SIGNS: ED Triage Vitals  Enc Vitals Group     BP 07/29/15 1033 146/114 mmHg     Pulse Rate 07/29/15 1033 102     Resp 07/29/15 1033 20     Temp 07/29/15 1033 99.1 F (37.3 C)     Temp Source 07/29/15 1033 Oral     SpO2 07/29/15 1033 96 %     Weight 07/29/15 1033 220 lb (99.791 kg)     Height 07/29/15 1033  (1.676 m)     Head Cir --      Peak Flow --      Pain Score 07/29/15 1033 10     Pain Loc --      Pain Edu? --      Excl. in GC? --    Constitutional: Alert and oriented. Well appearing and in no acute distress. Eyes: Conjunctivae are normal. PERRL. EOMI. Head: Atraumatic. Nose: No congestion/rhinnorhea. Mouth/Throat: Mucous membranes are moist.  Oropharynx non-erythematous. Neck: No stridor.   Cardiovascular: Normal rate, regular rhythm. Grossly normal heart sounds.  Good peripheral circulation. Respiratory: Normal respiratory effort.  No retractions. Lungs CTAB. Gastrointestinal: Soft with moderate nonfocal tenderness though rebound or guarding. Negative Murphy. No significant pain in the lower abdomen, she tends to note more discomfort in the left upper quadrant. No distention. No abdominal bruits. No CVA tenderness. No evidence of acute surgical abdomen. Musculoskeletal: No lower extremity tenderness nor edema.  No joint effusions. Neurologic:  Normal speech and language. No gross focal neurologic deficits are appreciated. No gait instability. Skin:  Skin is warm, dry and intact. No rash noted. Psychiatric: Mood and affect are normal. Speech and behavior are  normal.  ____________________________________________   LABS (all labs ordered are listed, but only abnormal results are displayed)  Labs Reviewed  COMPREHENSIVE METABOLIC PANEL - Abnormal; Notable for the following:    Sodium 134 (*)    Potassium 3.3 (*)    Chloride 98 (*)    Glucose, Bld 109 (*)    Total Protein 8.2 (*)    All other components within normal limits  CBC - Abnormal; Notable for the following:    RDW 15.3 (*)    All other components within normal limits  URINALYSIS COMPLETEWITH MICROSCOPIC ([facility] ONLY) - Abnormal; Notable for the following:  Color, Urine YELLOW (*)    APPearance CLEAR (*)    Ketones, ur TRACE (*)    Hgb urine dipstick 2+ (*)    Squamous Epithelial / LPF 0-5 (*)    All other components within normal limits  LIPASE, BLOOD  PREGNANCY, URINE  POC URINE PREG, ED  POCT PREGNANCY, URINE   ____________________________________________  EKG   ____________________________________________  RADIOLOGY  IMPRESSION: Normal bowel gas pattern. Bowel staples in the RIGHT abdomen. No acute abnormality. ____________________________________________   PROCEDURES  Procedure(s) performed: None  Critical Care performed: No  ____________________________________________   INITIAL IMPRESSION / ASSESSMENT AND PLAN / ED COURSE  Pertinent labs & imaging results that were available during my care of the patient were reviewed by me and considered in my medical decision making (see chart for details).  Presents with nausea and vomiting, simply recurrent in nature. She did have a CT scan roughly 1 month ago which did not demonstrate any acute emergent etiology. She reports her symptoms come and go off and on, these are very recurrent in nature since her surgery. He'll obtain x-ray to evaluate for evidence of obstruction given her postop status which puts her at slightly increased risk of adhesions. Otherwise, I feel that the best course is to hydrate her well  and reevaluate She has no evidence of acute surgical abdomen. Her labs and afebrile status are very reassuring.  ----------------------------------------- 3:32 PM on 07/29/2015 -----------------------------------------  Patient resting comfortably on reevaluation. She does report ongoing nausea, she does not appear in any distress. Repeat abdominal exam demonstrates soft nontender without acute abnormality and no peritonitis. No right lower quadrant pain and negative Murphy. Overall, the patient is well-appearing, because of ongoing nausea or give her additional dose of Phenergan and liter of fluid. I do not find evidence for surgical abnormality or need for emergent CT imaging. The patient has had a CAT scan approximately one month ago, and additive radiation doses are considered .  Ongoing care is transferred to Dr. Huel Cote to follow up on reexamination and symptoms at approximately 4 PM after additional fluids and antiemetics are given. ____________________________________________   FINAL CLINICAL IMPRESSION(S) / ED DIAGNOSES  Final diagnoses:  Non-intractable vomiting with nausea, vomiting of unspecified type      Sharyn Creamer, MD 07/29/15 1533

## 2015-07-29 NOTE — ED Provider Notes (Signed)
-----------------------------------------   4:33 PM on 07/29/2015 -----------------------------------------   Blood pressure 156/99, pulse 84, temperature 99.1 F (37.3 C), temperature source Oral, resp. rate 20, height  (1.676 m), weight 220 lb (99.791 kg), last menstrual period 06/27/2015, SpO2 99 %.  Assuming care from Dr. Fanny Bien. In short, [name] is a 29 y.o. female with a chief complaint of Abdominal Pain .  Refer to the original H&P for additional details.  The current plan of care is to discharge patient home. She was advised that she would no longer receive narcotic products from the emergency department. She needs to show evidence of follow-up. She states she went to her surgeon was referred to a gastroenterologist but they "" haven't called her back yet. "".   Jennye Moccasin, MD 07/29/15 [phone]

## 2015-07-29 NOTE — Discharge Instructions (Signed)
Nausea and Vomiting °Nausea is a sick feeling that often comes before throwing up (vomiting). Vomiting is a reflex where stomach contents come out of your mouth. Vomiting can cause severe loss of body fluids (dehydration). Children and elderly adults can become dehydrated quickly, especially if they also have diarrhea. Nausea and vomiting are symptoms of a condition or disease. It is important to find the cause of your symptoms. °CAUSES  °· Direct irritation of the stomach lining. This irritation can result from increased acid production (gastroesophageal reflux disease), infection, food poisoning, taking certain medicines (such as nonsteroidal anti-inflammatory drugs), alcohol use, or tobacco use. °· Signals from the brain. These signals could be caused by a headache, heat exposure, an inner ear disturbance, increased pressure in the brain from injury, infection, a tumor, or a concussion, pain, emotional stimulus, or metabolic problems. °· An obstruction in the gastrointestinal tract (bowel obstruction). °· Illnesses such as diabetes, hepatitis, gallbladder problems, appendicitis, kidney problems, cancer, sepsis, atypical symptoms of a heart attack, or eating disorders. °· Medical treatments such as chemotherapy and radiation. °· Receiving medicine that makes you sleep (general anesthetic) during surgery. °DIAGNOSIS °Your caregiver may ask for tests to be done if the problems do not improve after a few days. Tests may also be done if symptoms are severe or if the reason for the nausea and vomiting is not clear. Tests may include: °· Urine tests. °· Blood tests. °· Stool tests. °· Cultures (to look for evidence of infection). °· X-rays or other imaging studies. °Test results can help your caregiver make decisions about treatment or the need for additional tests. °TREATMENT °You need to stay well hydrated. Drink frequently but in small amounts. You may wish to drink water, sports drinks, clear broth, or eat frozen  ice pops or gelatin dessert to help stay hydrated. When you eat, eating slowly may help prevent nausea. There are also some antinausea medicines that may help prevent nausea. °HOME CARE INSTRUCTIONS  °· Take all medicine as directed by your caregiver. °· If you do not have an appetite, do not force yourself to eat. However, you must continue to drink fluids. °· If you have an appetite, eat a normal diet unless your caregiver tells you differently. °¨ Eat a variety of complex carbohydrates (rice, wheat, potatoes, bread), lean meats, yogurt, fruits, and vegetables. °¨ Avoid high-fat foods because they are more difficult to digest. °· Drink enough water and fluids to keep your urine clear or pale yellow. °· If you are dehydrated, ask your caregiver for specific rehydration instructions. Signs of dehydration may include: °¨ Severe thirst. °¨ Dry lips and mouth. °¨ Dizziness. °¨ Dark urine. °¨ Decreasing urine frequency and amount. °¨ Confusion. °¨ Rapid breathing or pulse. °SEEK IMMEDIATE MEDICAL CARE IF:  °· You have blood or brown flecks (like coffee grounds) in your vomit. °· You have black or bloody stools. °· You have a severe headache or stiff neck. °· You are confused. °· You have severe abdominal pain. °· You have chest pain or trouble breathing. °· You do not urinate at least once every 8 hours. °· You develop cold or clammy skin. °· You continue to vomit for longer than 24 to 48 hours. °· You have a fever. °MAKE SURE YOU:  °· Understand these instructions. °· Will watch your condition. °· Will get help right away if you are not doing well or get worse. °Document Released: 10/18/2005 Document Revised: 01/10/2012 Document Reviewed: 03/17/2011 °[employer]® Patient Information ©2015 [employer]. This information is not intended   to replace advice given to you by your health care provider. Make sure you discuss any questions you have with your health care provider.  Please return immediately if condition worsens.  Please contact her primary physician or the physician you were given for referral. If you have any specialist physicians involved in her treatment and plan please also contact them. Thank you for using [facility].

## 2015-08-23 ENCOUNTER — Encounter (HOSPITAL_COMMUNITY): Payer: Self-pay | Admitting: Emergency Medicine

## 2015-08-23 ENCOUNTER — Emergency Department (INDEPENDENT_AMBULATORY_CARE_PROVIDER_SITE_OTHER)
Admission: EM | Admit: 2015-08-23 | Discharge: 2015-08-23 | Disposition: A | Discharge status: Home / Self Care | Payer: Medicaid Other | Source: Home / Self Care | Attending: Family Medicine | Admitting: Family Medicine

## 2015-08-23 DIAGNOSIS — B079 Viral wart, unspecified: Secondary | ICD-10-CM

## 2015-08-23 DIAGNOSIS — L259 Unspecified contact dermatitis, unspecified cause: Secondary | ICD-10-CM | POA: Diagnosis not present

## 2015-08-23 MED ORDER — PREDNISONE 50 MG PO TABS: ORAL_TABLET | ORAL | Status: DC

## 2015-08-23 NOTE — Discharge Instructions (Signed)
It is a pleasure to see you today.  I believe your rash is caused by a localized reaction to something in your environment, possibly the cat.   I am prescribing you prednisone 50mg  tablets, take 1 tablet by mouth daily for 7 days.   Please follow up with your primary doctor at [facility] for this, and for the wart on your left ring finger.

## 2015-08-23 NOTE — ED Notes (Signed)
[facility] with the complaint of a rash on both arms that started 2 days ago. She also stated that she had a wart on her left ring finger that wanted evaluated.

## 2015-08-23 NOTE — ED Provider Notes (Signed)
CSN: [id number]     Arrival date & time 08/23/15  1523 History   First MD Initiated Contact with Patient 08/23/15 1655     Chief Complaint  Patient presents with  . Rash  . Verrucous Vulgaris   (Consider location/radiation/quality/duration/timing/severity/associated sxs/prior Treatment) Patient is a 29 y.o. female presenting with rash. The history is provided by the patient. No language interpreter was used.  Rash Patient p[resents with complaint of itchy rash across both arms, started 3 days ago.  She denies changes in detergents, soaps or other hygiene products.  Has used diaper rash cream which did not help.  No new medications.    Also with painful area on L ring finger, diagnosed with wart, saw Dermatology 5 months ago and treated with liquid nitrogen which did not resolve.  Continues to be painful. Using compound W on it.   ROS: Denies fevers/chills/sweats, no nausea or vomiting.  She obtained a new cat about a month ago, cat recently scratched both hands.   Past Medical History  Diagnosis Date  . GERD (gastroesophageal reflux disease)   . Foot pain   . Gastritis [id number]  . Seasonal allergies    Past Surgical History  Procedure Laterality Date  . Foot surgery    . Wrist surgery    . Breast biopsy Right 05/03/13  . Appendectomy N/A 05/25/2015    Procedure: APPENDECTOMY;  Surgeon: Manus RuddMatthew Tsuei, MD;  [facility];  Service: General;  Laterality: N/A;   Family History  Problem Relation Age of Onset  . Hypertension Father   . Diabetes Father   . Liver cancer Paternal Grandfather   . Diabetes Maternal Grandmother   . Kidney failure Maternal Grandmother    Social History  Substance Use Topics  . Smoking status: Never Smoker   . Smokeless tobacco: Never Used  . Alcohol Use: Yes     Comment: occ   OB History    Gravida Para Term Preterm AB TAB SAB Ectopic Multiple Living   2 1        1       Obstetric Comments   1st Menstrual Cycle:  12 1st Pregnancy: 20      Review of Systems  Skin: Positive for rash.    Allergies  Morphine and related  Home Medications   Prior to Admission medications   Medication Sig Start Date End Date Taking? Authorizing Provider  cyclobenzaprine (FLEXERIL) 5 MG tablet Take 5 mg by mouth 3 (three) times daily as needed for muscle spasms.    Historical Provider, MD  fexofenadine (ALLEGRA) 180 MG tablet Take 180 mg by mouth daily as needed for allergies or rhinitis.    Historical Provider, MD  metoCLOPramide (REGLAN) 10 MG tablet Take 1 tablet (10 mg total) by mouth every 6 (six) hours. 06/18/15   Elpidio AnisShari Upstill, PA-C  omeprazole (PRILOSEC) 20 MG capsule Take 20 mg by mouth daily.    Historical Provider, MD  ondansetron (ZOFRAN ODT) 4 MG disintegrating tablet 4mg  ODT q4 hours prn nausea/vomit Patient taking differently: Take 4 mg by mouth every 4 (four) hours as needed for nausea or vomiting. 4mg  ODT q4 hours prn nausea/vomit 06/13/15   Joycie PeekBenjamin Cartner, PA-C  oxyCODONE (ROXICODONE) 15 MG immediate release tablet Take 15 mg by mouth every 4 (four) hours as needed for pain.    Historical Provider, MD  predniSONE (DELTASONE) 50 MG tablet Take 1 tablet by mouth one time daily for 7 days 08/23/15   Barbaraann BarthelJames O Veneta Sliter, MD  promethazine (  PHENERGAN) 12.5 MG tablet Take 1 tablet (12.5 mg total) by mouth every 6 (six) hours as needed for nausea or vomiting. 06/29/15   Jennye Moccasin, MD  promethazine (PHENERGAN) 25 MG suppository Place 1 suppository (25 mg total) rectally every 6 (six) hours as needed for nausea or vomiting. 06/29/15   Jennye Moccasin, MD   Meds Ordered and Administered this Visit  Medications - No data to display  BP 135/82 mmHg  Pulse 84  Temp(Src) 98.3 F (36.8 C) (Oral)  SpO2 100%  LMP 08/23/2015 No data found.   Physical Exam  Constitutional: She appears well-developed and well-nourished. No distress.  Skin: She is not diaphoretic.  Raised erythematous urticarial rash across volar aspect of both forearms,  lateral aspects of upper arms. Blanching. Spares palms.   Left ring finger nailbed with crusting lesion c/w common wart.     ED Course  Procedures (including critical care time)  Labs Review Labs Reviewed - No data to display  Imaging Review No results found.   Visual Acuity Review  Right Eye Distance:   Left Eye Distance:   Bilateral Distance:    Right Eye Near:   Left Eye Near:    Bilateral Near:         MDM   1. Contact dermatitis   2. Viral wart on finger    Contact dermatitis, likely related to cat exposure.  Prednisone burst dose for 7 days. Follow with primary doctor.  L ring finger wart, to see Dermatology for more definitive treatment.      Barbaraann Barthel, MD 08/23/15 [phone]

## 2016-01-28 ENCOUNTER — Emergency Department
Admission: EM | Admit: 2016-01-28 | Discharge: 2016-01-28 | Disposition: A | Discharge status: Home / Self Care | Payer: Medicaid Other | Attending: Emergency Medicine | Admitting: Emergency Medicine

## 2016-01-28 ENCOUNTER — Emergency Department: Payer: Medicaid Other

## 2016-01-28 ENCOUNTER — Encounter: Payer: Self-pay | Admitting: Emergency Medicine

## 2016-01-28 DIAGNOSIS — Z79899 Other long term (current) drug therapy: Secondary | ICD-10-CM | POA: Insufficient documentation

## 2016-01-28 DIAGNOSIS — G44019 Episodic cluster headache, not intractable: Secondary | ICD-10-CM | POA: Diagnosis not present

## 2016-01-28 DIAGNOSIS — R51 Headache: Secondary | ICD-10-CM | POA: Diagnosis present

## 2016-01-28 LAB — POCT RAPID STREP A: Streptococcus, Group A Screen (Direct): NEGATIVE

## 2016-01-28 MED ORDER — METOCLOPRAMIDE HCL 10 MG PO TABS
10.0000 mg | ORAL_TABLET | Freq: Once | ORAL | Status: AC
Start: 2016-01-28 — End: 2016-01-28
  Administered 2016-01-28: 10 mg via ORAL
  Filled 2016-01-28: qty 1

## 2016-01-28 MED ORDER — KETOROLAC TROMETHAMINE 60 MG/2ML IM SOLN
INTRAMUSCULAR | Status: AC
  Filled 2016-01-28: qty 2

## 2016-01-28 MED ORDER — KETOROLAC TROMETHAMINE 60 MG/2ML IM SOLN
60.0000 mg | Freq: Once | INTRAMUSCULAR | Status: AC
  Administered 2016-01-28: 60 mg via INTRAMUSCULAR

## 2016-01-28 MED ORDER — SUMATRIPTAN SUCCINATE 6 MG/0.5ML ~~LOC~~ SOLN
6.0000 mg | Freq: Once | SUBCUTANEOUS | Status: AC
  Administered 2016-01-28: 6 mg via SUBCUTANEOUS
  Filled 2016-01-28: qty 0.5

## 2016-01-28 MED ORDER — METOCLOPRAMIDE HCL 5 MG PO TABS: 5.0000 mg | ORAL_TABLET | Freq: Three times a day (TID) | ORAL | Status: DC | PRN

## 2016-01-28 MED ORDER — DIAZEPAM 5 MG PO TABS
5.0000 mg | ORAL_TABLET | Freq: Once | ORAL | Status: AC
  Administered 2016-01-28: 5 mg via ORAL
  Filled 2016-01-28: qty 1

## 2016-01-28 MED ORDER — DIPHENHYDRAMINE HCL 25 MG PO CAPS
50.0000 mg | ORAL_CAPSULE | Freq: Once | ORAL | Status: AC
Start: 2016-01-28 — End: 2016-01-28
  Administered 2016-01-28: 50 mg via ORAL
  Filled 2016-01-28: qty 2

## 2016-01-28 MED ORDER — KETOROLAC TROMETHAMINE 10 MG PO TABS: 10.0000 mg | ORAL_TABLET | Freq: Three times a day (TID) | ORAL | Status: DC

## 2016-01-28 NOTE — ED Notes (Signed)
States she developed temporal headaches on Sunday  denies any n/v but positive diarrhea.   Fever and sore throat

## 2016-01-28 NOTE — ED Notes (Signed)
Up to bathroom  States headache is not any better. PA aware

## 2016-01-28 NOTE — ED Provider Notes (Signed)
[facility] Emergency Department Provider Note ____________________________________________  Time seen: 1103  I have reviewed the triage vital signs and the nursing notes.  HISTORY  Chief Complaint  Headache and Sore Throat  HPI [name] is a 30 y.o. female presents to the ED for evaluation of frontal headache, sore throat, and some soreness to the left side of the neck. She denies any recent injury, trauma, accident. She does report some nausea without vomiting and some diarrhea with recent episode being last night. She denies any visual change, weakness or distal paresthesias. She has been dosing ibuprofen 800 mg on schedule without significant benefit to her headache pain. She is unclear of the onset of the neck pain, but describes it as pain like "she had slept wrong."She did not receive the flu vaccine this season, denies any significant sinus or URI symptoms. She reports her headache pain as a 9/10 in triage. She denies a history of migraines but reports infrequent headaches, generally speaking.  Past Medical History  Diagnosis Date  . GERD (gastroesophageal reflux disease)   . Foot pain   . Gastritis [id number]  . Seasonal allergies     Patient Active Problem List   Diagnosis Date Noted  . Acute perforated appendicitis 05/25/2015  . Abdominal pain, left upper quadrant 02/21/2014  . Splenomegaly 02/21/2014  . Generalized anxiety disorder 02/21/2014  . Hemorrhage of rectum and anus 02/21/2014  . Lump or mass in breast 05/01/2013    Past Surgical History  Procedure Laterality Date  . Foot surgery    . Wrist surgery    . Breast biopsy Right 05/03/13  . Appendectomy N/A 05/25/2015    Procedure: APPENDECTOMY;  Surgeon: Manus RuddMatthew Tsuei, MD;  [facility];  Service: General;  Laterality: N/A;    Current Outpatient Rx  Name  Route  Sig  Dispense  Refill  . cyclobenzaprine (FLEXERIL) 5 MG tablet   Oral   Take 5 mg by mouth 3 (three) times daily  as needed for muscle spasms.         . fexofenadine (ALLEGRA) 180 MG tablet   Oral   Take 180 mg by mouth daily as needed for allergies or rhinitis.         Marland Kitchen. ketorolac (TORADOL) 10 MG tablet   Oral   Take 1 tablet (10 mg total) by mouth every 8 (eight) hours.   15 tablet   0   . metoCLOPramide (REGLAN) 5 MG tablet   Oral   Take 1 tablet (5 mg total) by mouth every 8 (eight) hours as needed for nausea or vomiting.   15 tablet   0   . omeprazole (PRILOSEC) 20 MG capsule   Oral   Take 20 mg by mouth daily.         . ondansetron (ZOFRAN ODT) 4 MG disintegrating tablet      4mg  ODT q4 hours prn nausea/vomit Patient taking differently: Take 4 mg by mouth every 4 (four) hours as needed for nausea or vomiting. 4mg  ODT q4 hours prn nausea/vomit   12 tablet   0   . oxyCODONE (ROXICODONE) 15 MG immediate release tablet   Oral   Take 15 mg by mouth every 4 (four) hours as needed for pain.         . predniSONE (DELTASONE) 50 MG tablet      Take 1 tablet by mouth one time daily for 7 days   7 tablet   0   . promethazine (  PHENERGAN) 12.5 MG tablet   Oral   Take 1 tablet (12.5 mg total) by mouth every 6 (six) hours as needed for nausea or vomiting.   30 tablet   0   . promethazine (PHENERGAN) 25 MG suppository   Rectal   Place 1 suppository (25 mg total) rectally every 6 (six) hours as needed for nausea or vomiting.   12 each   0    Allergies Morphine and related  Family History  Problem Relation Age of Onset  . Hypertension Father   . Diabetes Father   . Liver cancer Paternal Grandfather   . Diabetes Maternal Grandmother   . Kidney failure Maternal Grandmother     Social History Social History  Substance Use Topics  . Smoking status: Never Smoker   . Smokeless tobacco: Never Used  . Alcohol Use: Yes     Comment: occ   Review of Systems  Constitutional: Negative for fever. Eyes: Negative for visual changes. ENT: Positive for sore  throat. Cardiovascular: Negative for chest pain. Respiratory: Negative for shortness of breath. Gastrointestinal: Negative for abdominal pain, vomiting. Reports nausea and diarrhea. Genitourinary: Negative for dysuria. Musculoskeletal: Negative for back pain. Skin: Negative for rash. Neurological: Negative for headaches, focal weakness or numbness. ____________________________________________  PHYSICAL EXAM:  VITAL SIGNS: ED Triage Vitals  Enc Vitals Group     BP 01/28/16 1034 150/103 mmHg     Pulse Rate 01/28/16 1034 120     Resp 01/28/16 1034 18     Temp 01/28/16 1034 100 F (37.8 C)     Temp Source 01/28/16 1034 Oral     SpO2 01/28/16 1034 100 %     Weight 01/28/16 1034 238 lb (107.956 kg)     Height 01/28/16 1034  (1.676 m)     Head Cir --      Peak Flow --      Pain Score 01/28/16 1034 9     Pain Loc --      Pain Edu? --      Excl. in GC? --    Constitutional: Alert and oriented. Well appearing and in no distress. Head: Normocephalic and atraumatic.      Eyes: Conjunctivae are normal. PERRL. Normal extraocular movements      Ears: Canals clear. TMs intact bilaterally.   Nose: No congestion/rhinorrhea.   Mouth/Throat: Mucous membranes are moist.   Neck: Supple. No thyromegaly. Mild tenderness to the left SCM muscle.  Hematological/Lymphatic/Immunological: No cervical lymphadenopathy. Cardiovascular: Normal rate, regular rhythm. Normal distal pulses Respiratory: Normal respiratory effort. No wheezes/rales/rhonchi. Gastrointestinal: Soft and nontender. No distention. No CVA tenderness Musculoskeletal: Nontender with normal range of motion in all extremities.  Neurologic: Cranial nerves II through XII grossly intact. Normal UE DTRs bilaterally. Normal intrinsic and opposition testing. Normal gait without ataxia. Normal speech and language. No gross focal neurologic deficits are appreciated. Skin:  Skin is warm, dry and intact. No rash noted. Psychiatric:  Mood and affect are normal. Patient exhibits appropriate insight and judgment. ____________________________________________   LABS (pertinent positives/negatives) Labs Reviewed  CULTURE, GROUP A STREP [facility])  POCT RAPID STREP A  ____________________________________________  RADIOLOGY  Head CT w/o Contrast IMPRESSION: Study within normal limits.  PROCEDURES  Benadryl 50 mg PO Reglan 10 mg PO Sumatriptan 6 mg SQ Toradol 60 mg IM Valium 5 mg PO ____________________________________________  INITIAL IMPRESSION / ASSESSMENT AND PLAN / ED COURSE  Patient updated her medical history after the initial medicines were provided. She describes an episode about  a month prior she was pushing her children on a merry-go-round, and felt an immediate "pop" to the back of her head. She noted at that time she had nausea, dizziness, and weakness. She also noted some pain on the posterior aspect of the head even over the top of the head. She reports intermittent headaches since that time which are above her baseline. Patient will be treated for a potential cluster type headache versus tension headache. She will be discharged with prescriptions for Toradol and Reglan. She is advised initially to dose her prescription prescribed Flexeril as well as Benadryl as needed for headache control. She will follow-up with primary care provider for ongoing symptom management. Patient is reassured by her negative CT imaging today. She reports her headache pain is improved to 5/10 at discharge. Throat culture is pending at the time of discharge. ____________________________________________  FINAL CLINICAL IMPRESSION(S) / ED DIAGNOSES  Final diagnoses:  Episodic cluster headache, not intractable      Lissa Hoard, PA-C 01/28/16 1551  Jeanmarie Plant, MD 01/29/16 1350

## 2016-01-28 NOTE — Discharge Instructions (Signed)
Cluster Headache Cluster headaches are recognized by their pattern of deep, intense head pain. They normally occur on one side of your head, but they may "switch sides" in subsequent episodes. Typically, cluster headaches:   Are severe in nature.   Occur repeatedly over weeks to months and are followed by periods of no headaches.   Can last from 15 minutes to 3 hours.   Occur at the same time each day, often at night.   Occur several times a day. CAUSES The exact cause of cluster headaches is not known. Alcohol use may be associated with cluster headaches. SIGNS AND SYMPTOMS   Severe pain that begins in or around your eye or temple.   One-sided head pain.   Feeling sick to your stomach (nauseous).   Sensitivity to light.   Runny nose.   Eye redness, tearing, and nasal stuffiness on the side of your head where you are experiencing pain.   Sweaty, pale skin of the face.   Droopy or swollen eyelid.   Restlessness. DIAGNOSIS  Cluster headaches are diagnosed based on symptoms and a physical exam. Your health care provider may order a CT scan or an MRI of your head or lab tests to see if your headaches are caused by other medical conditions.  TREATMENT   Medicines for pain relief and to prevent recurrent attacks. Some people may need a combination of medicines.  Oxygen for pain relief.   Biofeedback programs to help reduce headache pain.  It may be helpful to keep a headache diary. This may help you find a trend for what is triggering your headaches. Your health care provider can develop a treatment plan.  HOME CARE INSTRUCTIONS  During cluster periods:   Follow a regular sleep schedule. Do not vary the amount and time that you sleep from day to day. It is important to stay on the same schedule during a cluster period to help prevent headaches.   Avoid alcohol.   Stop smoking if you smoke.  SEEK MEDICAL CARE IF:  You have any changes from your previous  cluster headaches either in intensity or frequency.   You are not getting relief from medicines you are taking.  SEEK IMMEDIATE MEDICAL CARE IF:   You faint.   You have weakness or numbness, especially on one side of your body or face.   You have double vision.   You have nausea or vomiting that is not relieved within several hours.   You cannot keep your balance or have difficulty talking or walking.   You have neck pain or stiffness.   You have a fever. MAKE SURE YOU:  Understand these instructions.   Will watch your condition.   Will get help right away if you are not doing well or get worse.   This information is not intended to replace advice given to you by your health care provider. Make sure you discuss any questions you have with your health care provider.   Document Released: 10/18/2005 Document Revised: 08/08/2013 Document Reviewed: 05/10/2013 Elsevier Interactive Patient Education 2016 [employer]   You may be experiencing a cluster-type headache. You should take the prescription meds as directed. Dose 25-50 mg of Benadryl (diphenhydramine) for additional headache pain relief. Follow-up with Dr. Marcelle OverlieHolland for continued symptoms.

## 2016-01-28 NOTE — ED Notes (Signed)
Pt presents with headache unrelieved by 800 mg motrin. Pt states has neck pain and sore throat. Pt reports diarrhea.

## 2016-01-30 LAB — CULTURE, GROUP A STREP (THRC)

## 2016-02-02 ENCOUNTER — Emergency Department (HOSPITAL_COMMUNITY)
Admission: EM | Admit: 2016-02-02 | Discharge: 2016-02-03 | Disposition: A | Discharge status: Home / Self Care | Payer: Medicaid Other | Attending: Emergency Medicine | Admitting: Emergency Medicine

## 2016-02-02 ENCOUNTER — Encounter (HOSPITAL_COMMUNITY): Payer: Self-pay | Admitting: Emergency Medicine

## 2016-02-02 DIAGNOSIS — B349 Viral infection, unspecified: Secondary | ICD-10-CM | POA: Diagnosis not present

## 2016-02-02 DIAGNOSIS — K219 Gastro-esophageal reflux disease without esophagitis: Secondary | ICD-10-CM | POA: Diagnosis not present

## 2016-02-02 DIAGNOSIS — Z79899 Other long term (current) drug therapy: Secondary | ICD-10-CM | POA: Diagnosis not present

## 2016-02-02 DIAGNOSIS — R05 Cough: Secondary | ICD-10-CM | POA: Diagnosis present

## 2016-02-02 NOTE — ED Notes (Addendum)
Pt. reports productive cough /sore throat onset last week , denies fever or chills , currently taking oral antibiotic with no improvement . Respirations unlabored. Se[facility] Thursday with negative strep screen.

## 2016-02-03 ENCOUNTER — Emergency Department (HOSPITAL_COMMUNITY): Payer: Medicaid Other

## 2016-02-03 LAB — CBC WITH DIFFERENTIAL/PLATELET
Basophils Absolute: 0 10*3/uL (ref 0.0–0.1)
Basophils Relative: 0 %
Eosinophils Absolute: 0.1 10*3/uL (ref 0.0–0.7)
Eosinophils Relative: 3 %
HCT: 37 % (ref 36.0–46.0)
Hemoglobin: 12 g/dL (ref 12.0–15.0)
Lymphocytes Relative: 46 %
Lymphs Abs: 2.1 10*3/uL (ref 0.7–4.0)
MCH: 26.9 pg (ref 26.0–34.0)
MCHC: 32.4 g/dL (ref 30.0–36.0)
MCV: 83 fL (ref 78.0–100.0)
Monocytes Absolute: 0.3 10*3/uL (ref 0.1–1.0)
Monocytes Relative: 6 %
Neutro Abs: 2 10*3/uL (ref 1.7–7.7)
Neutrophils Relative %: 45 %
Platelets: 171 10*3/uL (ref 150–400)
RBC: 4.46 MIL/uL (ref 3.87–5.11)
RDW: 14.1 % (ref 11.5–15.5)
WBC: 4.5 10*3/uL (ref 4.0–10.5)

## 2016-02-03 MED ORDER — GUAIFENESIN-CODEINE 100-10 MG/5ML PO SYRP: 5.0000 mL | ORAL_SOLUTION | Freq: Three times a day (TID) | ORAL | Status: DC | PRN

## 2016-02-03 NOTE — Discharge Instructions (Signed)

## 2016-02-03 NOTE — ED Provider Notes (Signed)
CSN: [id number]     Arrival date & time 02/02/16  2300 History   First MD Initiated Contact with Patient 02/03/16 0006     Chief Complaint  Patient presents with  . Sore Throat  . Cough     (Consider location/radiation/quality/duration/timing/severity/associated sxs/prior Treatment) HPI Comments: Patient with persistent URI symptoms. [facility].  Patient is a 30 y.o. female presenting with pharyngitis and cough. The history is provided by the patient. No language interpreter was used.  Sore Throat This is a new problem. The current episode started in the past 7 days. The problem has been waxing and waning. Associated symptoms include coughing, nausea and a sore throat. Pertinent negatives include no vomiting. The symptoms are aggravated by swallowing.  Cough Associated symptoms: sore throat     Past Medical History  Diagnosis Date  . GERD (gastroesophageal reflux disease)   . Foot pain   . Gastritis [id number]  . Seasonal allergies    Past Surgical History  Procedure Laterality Date  . Foot surgery    . Wrist surgery    . Breast biopsy Right 05/03/13  . Appendectomy N/A 05/25/2015    Procedure: APPENDECTOMY;  Surgeon: Manus RuddMatthew Tsuei, MD;  [facility];  Service: General;  Laterality: N/A;   Family History  Problem Relation Age of Onset  . Hypertension Father   . Diabetes Father   . Liver cancer Paternal Grandfather   . Diabetes Maternal Grandmother   . Kidney failure Maternal Grandmother    Social History  Substance Use Topics  . Smoking status: Never Smoker   . Smokeless tobacco: Never Used  . Alcohol Use: Yes     Comment: occ   OB History    Gravida Para Term Preterm AB TAB SAB Ectopic Multiple Living   2 1        1       Obstetric Comments   1st Menstrual Cycle:  12 1st Pregnancy: 20     Review of Systems  HENT: Positive for sore throat.   Respiratory: Positive for cough.   Gastrointestinal: Positive for nausea and diarrhea. Negative  for vomiting.  All other systems reviewed and are negative.     Allergies  Morphine and related  Home Medications   Prior to Admission medications   Medication Sig Start Date End Date Taking? Authorizing Provider  cyclobenzaprine (FLEXERIL) 5 MG tablet Take 5 mg by mouth 3 (three) times daily as needed for muscle spasms.    Historical Provider, MD  fexofenadine (ALLEGRA) 180 MG tablet Take 180 mg by mouth daily as needed for allergies or rhinitis.    Historical Provider, MD  ketorolac (TORADOL) 10 MG tablet Take 1 tablet (10 mg total) by mouth every 8 (eight) hours. 01/28/16   Jenise V Bacon Menshew, PA-C  metoCLOPramide (REGLAN) 5 MG tablet Take 1 tablet (5 mg total) by mouth every 8 (eight) hours as needed for nausea or vomiting. 01/28/16    RobertJenise V Bacon Menshew, PA-C  omeprazole (PRILOSEC) 20 MG capsule Take 20 mg by mouth daily.    Historical Provider, MD  ondansetron (ZOFRAN ODT) 4 MG disintegrating tablet 4mg  ODT q4 hours prn nausea/vomit Patient taking differently: Take 4 mg by mouth every 4 (four) hours as needed for nausea or vomiting. 4mg  ODT q4 hours prn nausea/vomit 06/13/15   Joycie PeekBenjamin Cartner, PA-C  oxyCODONE (ROXICODONE) 15 MG immediate release tablet Take 15 mg by mouth every 4 (four) hours as needed for pain.  Historical Provider, MD  predniSONE (DELTASONE) 50 MG tablet Take 1 tablet by mouth one time daily for 7 days 08/23/15   Barbaraann Barthel, MD  promethazine (PHENERGAN) 12.5 MG tablet Take 1 tablet (12.5 mg total) by mouth every 6 (six) hours as needed for nausea or vomiting. 06/29/15   Jennye Moccasin, MD  promethazine (PHENERGAN) 25 MG suppository Place 1 suppository (25 mg total) rectally every 6 (six) hours as needed for nausea or vomiting. 06/29/15   Jennye Moccasin, MD   BP 144/97 mmHg  Pulse 82  Temp(Src) 99 F (37.2 C) (Oral)  Resp 16  Ht  (1.676 m)  Wt 114.306 kg  BMI 40.69 kg/m2  LMP 01/25/2016 Physical Exam  Constitutional: She is oriented to person,  place, and time. She appears well-developed and well-nourished.  HENT:  Head: Normocephalic.  Mouth/Throat: Mucous membranes are normal. Posterior oropharyngeal erythema present. No oropharyngeal exudate.  Eyes: Pupils are equal, round, and reactive to light.  Neck: Neck supple.  Pulmonary/Chest: Effort normal.  Abdominal: Soft. Bowel sounds are normal.  Musculoskeletal: She exhibits no edema.  Lymphadenopathy:    She has no cervical adenopathy.  Neurological: She is alert and oriented to person, place, and time.  Skin: Skin is warm and dry. No rash noted.  Psychiatric: She has a normal mood and affect.  Nursing note and vitals reviewed.   ED Course  Procedures (including critical care time) Labs Review Labs Reviewed  CBC WITH DIFFERENTIAL/PLATELET    Imaging Review Dg Chest 2 View  02/03/2016  CLINICAL DATA:  Cough and fever for 1 week EXAM: CHEST  2 VIEW COMPARISON:  06/29/2015 FINDINGS: The heart size and mediastinal contours are within normal limits. Both lungs are clear. The visualized skeletal structures are unremarkable. IMPRESSION: No active cardiopulmonary disease. Electronically Signed   By: Ellery Plunk M.D.   On: 02/03/2016 01:36   I have personally reviewed and evaluated these images and lab results as part of my medical decision-making.   EKG Interpretation None     Symptoms consistent with viral illness. MDM   Final diagnoses:  None  Viral illness. Sore throat. Cough.  Pt symptoms consistent with URI. CXR negative for acute infiltrate. Pt will be discharged with symptomatic treatment.  Discussed return precautions.  Pt is hemodynamically stable & in NAD prior to discharge.  Felicie Morn, NP 02/03/16 4098  Mancel Bale, MD 02/03/16 0700

## 2016-02-17 ENCOUNTER — Emergency Department (HOSPITAL_COMMUNITY)
Admission: EM | Admit: 2016-02-17 | Discharge: 2016-02-17 | Disposition: A | Discharge status: Home / Self Care | Payer: Medicaid Other | Attending: Emergency Medicine | Admitting: Emergency Medicine

## 2016-02-17 ENCOUNTER — Encounter (HOSPITAL_COMMUNITY): Payer: Self-pay | Admitting: Emergency Medicine

## 2016-02-17 DIAGNOSIS — K219 Gastro-esophageal reflux disease without esophagitis: Secondary | ICD-10-CM | POA: Insufficient documentation

## 2016-02-17 DIAGNOSIS — M545 Low back pain, unspecified: Secondary | ICD-10-CM

## 2016-02-17 DIAGNOSIS — Z79899 Other long term (current) drug therapy: Secondary | ICD-10-CM | POA: Diagnosis not present

## 2016-02-17 DIAGNOSIS — Z3202 Encounter for pregnancy test, result negative: Secondary | ICD-10-CM | POA: Diagnosis not present

## 2016-02-17 LAB — URINALYSIS, ROUTINE W REFLEX MICROSCOPIC
Bilirubin Urine: NEGATIVE
Glucose, UA: NEGATIVE mg/dL
Hgb urine dipstick: NEGATIVE
Ketones, ur: NEGATIVE mg/dL
Leukocytes, UA: NEGATIVE
Nitrite: NEGATIVE
Protein, ur: NEGATIVE mg/dL
Specific Gravity, Urine: 1.022 (ref 1.005–1.030)
pH: 6 (ref 5.0–8.0)

## 2016-02-17 LAB — POC URINE PREG, ED: Preg Test, Ur: NEGATIVE

## 2016-02-17 MED ORDER — OXYCODONE-ACETAMINOPHEN 5-325 MG PO TABS
1.0000 | ORAL_TABLET | Freq: Once | ORAL | Status: AC
  Administered 2016-02-17: 1 via ORAL
  Filled 2016-02-17: qty 1

## 2016-02-17 MED ORDER — NAPROXEN 250 MG PO TABS: 250.0000 mg | ORAL_TABLET | Freq: Two times a day (BID) | ORAL | Status: DC

## 2016-02-17 MED ORDER — METHOCARBAMOL 500 MG PO TABS: 500.0000 mg | ORAL_TABLET | Freq: Two times a day (BID) | ORAL | Status: DC | PRN

## 2016-02-17 NOTE — Discharge Instructions (Signed)
Back Exercises °The following exercises strengthen the muscles that help to support the back. They also help to keep the lower back flexible. Doing these exercises can help to prevent back pain or lessen existing pain. °If you have back pain or discomfort, try doing these exercises 2-3 times each day or as told by your health care provider. When the pain goes away, do them once each day, but increase the number of times that you repeat the steps for each exercise (do more repetitions). If you do not have back pain or discomfort, do these exercises once each day or as told by your health care provider. °EXERCISES °Single Knee to Chest °Repeat these steps 3-5 times for each leg: °· Lie on your back on a firm bed or the floor with your legs extended. °· Bring one knee to your chest. Your other leg should stay extended and in contact with the floor. °· Hold your knee in place by grabbing your knee or thigh. °· Pull on your knee until you feel a gentle stretch in your lower back. °· Hold the stretch for 10-30 seconds. °· Slowly release and straighten your leg. °Pelvic Tilt °Repeat these steps 5-10 times: °· Lie on your back on a firm bed or the floor with your legs extended. °· Bend your knees so they are pointing toward the ceiling and your feet are flat on the floor. °· Tighten your lower abdominal muscles to press your lower back against the floor. This motion will tilt your pelvis so your tailbone points up toward the ceiling instead of pointing to your feet or the floor. °· With gentle tension and even breathing, hold this position for 5-10 seconds. °Cat-Cow °Repeat these steps until your lower back becomes more flexible: °· Get into a hands-and-knees position on a firm surface. Keep your hands under your shoulders, and keep your knees under your hips. You may place padding under your knees for comfort. °· Let your head hang down, and point your tailbone toward the floor so your lower back becomes rounded like the  back of a cat. °· Hold this position for 5 seconds. °· Slowly lift your head and point your tailbone up toward the ceiling so your back forms a sagging arch like the back of a cow. °· Hold this position for 5 seconds. °Press-Ups °Repeat these steps 5-10 times: °· Lie on your abdomen (face-down) on the floor. °· Place your palms near your head, about shoulder-width apart. °· While you keep your back as relaxed as possible and keep your hips on the floor, slowly straighten your arms to raise the top half of your body and lift your shoulders. Do not use your back muscles to raise your upper torso. You may adjust the placement of your hands to make yourself more comfortable. °· Hold this position for 5 seconds while you keep your back relaxed. °· Slowly return to lying flat on the floor. °Bridges °Repeat these steps 10 times: °1. Lie on your back on a firm surface. °2. Bend your knees so they are pointing toward the ceiling and your feet are flat on the floor. °3. Tighten your buttocks muscles and lift your buttocks off of the floor until your waist is at almost the same height as your knees. You should feel the muscles working in your buttocks and the back of your thighs. If you do not feel these muscles, slide your feet 1-2 inches farther away from your buttocks. °4. Hold this position for 3-5   seconds. °5. Slowly lower your hips to the starting position, and allow your buttocks muscles to relax completely. °If this exercise is too easy, try doing it with your arms crossed over your chest. °Abdominal Crunches °Repeat these steps 5-10 times: °1. Lie on your back on a firm bed or the floor with your legs extended. °2. Bend your knees so they are pointing toward the ceiling and your feet are flat on the floor. °3. Cross your arms over your chest. °4. Tip your chin slightly toward your chest without bending your neck. °5. Tighten your abdominal muscles and slowly raise your trunk (torso) high enough to lift your shoulder  blades a tiny bit off of the floor. Avoid raising your torso higher than that, because it can put too much stress on your low back and it does not help to strengthen your abdominal muscles. °6. Slowly return to your starting position. °Back Lifts °Repeat these steps 5-10 times: °1. Lie on your abdomen (face-down) with your arms at your sides, and rest your forehead on the floor. °2. Tighten the muscles in your legs and your buttocks. °3. Slowly lift your chest off of the floor while you keep your hips pressed to the floor. Keep the back of your head in line with the curve in your back. Your eyes should be looking at the floor. °4. Hold this position for 3-5 seconds. °5. Slowly return to your starting position. °SEEK MEDICAL CARE IF: °· Your back pain or discomfort gets much worse when you do an exercise. °· Your back pain or discomfort does not lessen within 2 hours after you exercise. °If you have any of these problems, stop doing these exercises right away. Do not do them again unless your health care provider says that you can. °SEEK IMMEDIATE MEDICAL CARE IF: °· You develop sudden, severe back pain. If this happens, stop doing the exercises right away. Do not do them again unless your health care provider says that you can. °  °This information is not intended to replace advice given to you by your health care provider. Make sure you discuss any questions you have with your health care provider. °  °Document Released: 11/25/2004 Document Revised: 07/09/2015 Document Reviewed: 12/12/2014 °Elsevier Interactive Patient Education ©2016 [employer] ° °Back Pain, Adult °Back pain is very common in adults. The cause of back pain is rarely dangerous and the pain often gets better over time. The cause of your back pain may not be known. Some common causes of back pain include: °· Strain of the muscles or ligaments supporting the spine. °· Wear and tear (degeneration) of the spinal disks. °· Arthritis. °· Direct injury  to the back. °For many people, back pain may return. Since back pain is rarely dangerous, most people can learn to manage this condition on their own. °HOME CARE INSTRUCTIONS °Watch your back pain for any changes. The following actions may help to lessen any discomfort you are feeling: °· Remain active. It is stressful on your back to sit or stand in one place for long periods of time. Do not sit, drive, or stand in one place for more than 30 minutes at a time. Take short walks on even surfaces as soon as you are able. Try to increase the length of time you walk each day. °· Exercise regularly as directed by your health care provider. Exercise helps your back heal faster. It also helps avoid future injury by keeping your muscles strong and flexible. °· Do not stay in   bed. Resting more than 1-2 days can delay your recovery. °· Pay attention to your body when you bend and lift. The most comfortable positions are those that put less stress on your recovering back. Always use proper lifting techniques, including: °¨ Bending your knees. °¨ Keeping the load close to your body. °¨ Avoiding twisting. °· Find a comfortable position to sleep. Use a firm mattress and lie on your side with your knees slightly bent. If you lie on your back, put a pillow under your knees. °· Avoid feeling anxious or stressed. Stress increases muscle tension and can worsen back pain. It is important to recognize when you are anxious or stressed and learn ways to manage it, such as with exercise. °· Take medicines only as directed by your health care provider. Over-the-counter medicines to reduce pain and inflammation are often the most helpful. Your health care provider may prescribe muscle relaxant drugs. These medicines help dull your pain so you can more quickly return to your normal activities and healthy exercise. °· Apply ice to the injured area: °¨ Put ice in a plastic bag. °¨ Place a towel between your skin and the bag. °¨ Leave the ice on  for 20 minutes, 2-3 times a day for the first 2-3 days. After that, ice and heat may be alternated to reduce pain and spasms. °· Maintain a healthy weight. Excess weight puts extra stress on your back and makes it difficult to maintain good posture. °SEEK MEDICAL CARE IF: °· You have pain that is not relieved with rest or medicine. °· You have increasing pain going down into the legs or buttocks. °· You have pain that does not improve in one week. °· You have night pain. °· You lose weight. °· You have a fever or chills. °SEEK IMMEDIATE MEDICAL CARE IF:  °· You develop new bowel or bladder control problems. °· You have unusual weakness or numbness in your arms or legs. °· You develop nausea or vomiting. °· You develop abdominal pain. °· You feel faint. °  °This information is not intended to replace advice given to you by your health care provider. Make sure you discuss any questions you have with your health care provider. °  °Document Released: 10/18/2005 Document Revised: 11/08/2014 Document Reviewed: 02/19/2014 °Elsevier Interactive Patient Education ©2016 [employer] ° °

## 2016-02-17 NOTE — ED Notes (Signed)
PA at bedside.

## 2016-02-17 NOTE — ED Provider Notes (Signed)
CSN: [id number]     Arrival date & time 02/17/16  0549 History   First MD Initiated Contact with Patient 02/17/16 0703     Chief Complaint  Patient presents with  . Back Pain   [name] is a 30 y.o. female who presents to the ED complaining of right mid to lower back pain since waking up this morning. She complains of 9/10 pain that is constant and worse with movement. She has taken nothing for treatment today. She denies any known injury to her back. She denies any radiating pain. She denies any urinary symptoms. She denies history of cancer or IV drug use. She denies fevers, Cough, shortness of breath, chest pain, abdominal pain, nausea, vomiting, rashes, numbness, tingling, weakness, loss of bladder control, loss of bowel control, dysuria, hematuria, urinary frequency or urinary urgency.  The history is provided by the patient. No language interpreter was used.    Past Medical History  Diagnosis Date  . GERD (gastroesophageal reflux disease)   . Foot pain   . Gastritis 7829,5621  . Seasonal allergies    Past Surgical History  Procedure Laterality Date  . Foot surgery    . Wrist surgery    . Breast biopsy Right 05/03/13  . Appendectomy N/A 05/25/2015    Procedure: APPENDECTOMY;  Surgeon: Manus Rudd, MD;  Location: [facility] OR;  Service: General;  Laterality: N/A;   Family History  Problem Relation Age of Onset  . Hypertension Father   . Diabetes Father   . Liver cancer Paternal Grandfather   . Diabetes Maternal Grandmother   . Kidney failure Maternal Grandmother    Social History  Substance Use Topics  . Smoking status: Never Smoker   . Smokeless tobacco: Never Used  . Alcohol Use: Yes     Comment: occ   OB History    Gravida Para Term Preterm AB TAB SAB Ectopic Multiple Living   Obstetric Comments   1st Menstrual Cycle:  12 1st Pregnancy: 20     Review of Systems  Constitutional: Negative for fever and chills.  HENT: Negative for congestion  and sore throat.   Eyes: Negative for visual disturbance.  Respiratory: Negative for cough, shortness of breath and wheezing.   Cardiovascular: Negative for chest pain, palpitations and leg swelling.  Gastrointestinal: Negative for nausea, vomiting and abdominal pain.  Genitourinary: Negative for dysuria, urgency, frequency, hematuria, vaginal bleeding, vaginal discharge, difficulty urinating and pelvic pain.  Musculoskeletal: Positive for back pain. Negative for joint swelling and neck pain.  Skin: Negative for rash.  Neurological: Negative for weakness, light-headedness, numbness and headaches.      Allergies  Morphine and related  Home Medications   Prior to Admission medications   Medication Sig Start Date End Date Taking? Authorizing Provider  cyclobenzaprine (FLEXERIL) 5 MG tablet Take 5 mg by mouth 3 (three) times daily as needed for muscle spasms.    Historical Provider, MD  fexofenadine (ALLEGRA) 180 MG tablet Take 180 mg by mouth daily as needed for allergies or rhinitis.    Historical Provider, MD  guaiFENesin-codeine (ROBITUSSIN AC) 100-10 MG/5ML syrup Take 5 mLs by mouth 3 (three) times daily as needed for cough. 02/03/16   Felicie Morn, NP  ketorolac (TORADOL) 10 MG tablet Take 1 tablet (10 mg total) by mouth every 8 (eight) hours. 01/28/16   Jenise V Bacon Menshew, PA-C  methocarbamol (ROBAXIN) 500 MG tablet Take  1 tablet (500 mg total) by mouth 2 (two) times daily as needed for muscle spasms. 02/17/16   Everlene Farrier, PA-C  metoCLOPramide (REGLAN) 5 MG tablet Take 1 tablet (5 mg total) by mouth every 8 (eight) hours as needed for nausea or vomiting. 01/28/16   Charlesetta Ivory Menshew, PA-C  naproxen (NAPROSYN) 250 MG tablet Take 1 tablet (250 mg total) by mouth 2 (two) times daily with a meal. 02/17/16   Everlene Farrier, PA-C  omeprazole (PRILOSEC) 20 MG capsule Take 20 mg by mouth daily.    Historical Provider, MD  ondansetron (ZOFRAN ODT) 4 MG disintegrating tablet  ODT q4  hours prn nausea/vomit Patient taking differently: Take 4 mg by mouth every 4 (four) hours as needed for nausea or vomiting.  ODT q4 hours prn nausea/vomit 06/13/15   Joycie Peek, PA-C  oxyCODONE (ROXICODONE) 15 MG immediate release tablet Take 15 mg by mouth every 4 (four) hours as needed for pain.    Historical Provider, MD  predniSONE (DELTASONE) 50 MG tablet Take 1 tablet by mouth one time daily for 7 days 08/23/15   Barbaraann Barthel, MD  promethazine (PHENERGAN) 12.5 MG tablet Take 1 tablet (12.5 mg total) by mouth every 6 (six) hours as needed for nausea or vomiting. 06/29/15   Jennye Moccasin, MD  promethazine (PHENERGAN) 25 MG suppository Place 1 suppository (25 mg total) rectally every 6 (six) hours as needed for nausea or vomiting. 06/29/15   Jennye Moccasin, MD   BP 141/90 mmHg  Pulse 85  Temp(Src) 97.6 F (36.4 C) (Oral)  Resp 18  Ht  (1.676 m)  Wt 112.747 kg  BMI 40.14 kg/m2  SpO2 99%  LMP 01/27/2016 Physical Exam  Constitutional: She is oriented to person, place, and time. She appears well-developed and well-nourished. No distress.  Nontoxic appearing.  HENT:  Head: Normocephalic and atraumatic.  Mouth/Throat: Oropharynx is clear and moist.  Eyes: Conjunctivae are normal. Pupils are equal, round, and reactive to light. Right eye exhibits no discharge. Left eye exhibits no discharge.  Neck: Normal range of motion. Neck supple. No JVD present.  Cardiovascular: Normal rate, regular rhythm, normal heart sounds and intact distal pulses.  Exam reveals no gallop and no friction rub.   No murmur heard. Heart rate is 84. Bilateral radial and posterior tibialis pulses are intact.   Pulmonary/Chest: Effort normal and breath sounds normal. No respiratory distress. She has no wheezes. She has no rales.  Lungs are clear to auscultation bilaterally. Symmetric chest expansion bilaterally.  Abdominal: Soft. Bowel sounds are normal. She exhibits no distension. There is no tenderness.  There is no guarding.  Abdomen is soft and nontender to palpation.  Musculoskeletal: Normal range of motion. She exhibits no edema or tenderness.  No lower extremity edema or tenderness. Patient is spontaneously moving all extremities in a coordinated fashion exhibiting good strength.  No midline neck or back tenderness. Back is nontender to palpation. Back erythema, edema, deformity, ecchymosis or rashes.  Lymphadenopathy:    She has no cervical adenopathy.  Neurological: She is alert and oriented to person, place, and time. She displays normal reflexes. Coordination normal.  Sensation is intact to her bilateral upper and lower extremities. Normal gait. Good strength in her bilateral LE.   Skin: Skin is warm and dry. No rash noted. She is not diaphoretic. No erythema. No pallor.  Psychiatric: She has a normal mood and affect. Her behavior is normal.  Nursing note and vitals reviewed.  ED Course  Procedures (including critical care time) Labs Review Labs Reviewed  URINALYSIS, ROUTINE W REFLEX MICROSCOPIC [facility])  POC URINE PREG, ED    Imaging Review No results found. I have personally reviewed and evaluated these lab results as part of my medical decision-making.   EKG Interpretation None      Filed Vitals:   02/17/16 0554 02/17/16 0730 02/17/16 0739  BP: 147/105 141/90   Pulse: 101 85   Temp: 98.8 F (37.1 C)  97.6 F (36.4 C)  TempSrc: Oral  Oral  Resp: 26  18  Height: 5\' 6"  (1.676 m)    Weight: 112.747 kg    SpO2: 99% 99%      MDM   Meds given in ED:  Medications  oxyCODONE-acetaminophen (PERCOCET/ROXICET) 5-325 MG per tablet 1 tablet (1 tablet Oral Given 02/17/16 0739)    New Prescriptions   METHOCARBAMOL (ROBAXIN) 500 MG TABLET    Take 1 tablet (500 mg total) by mouth 2 (two) times daily as needed for muscle spasms.   NAPROXEN (NAPROSYN) 250 MG TABLET    Take 1 tablet (250 mg total) by mouth 2 (two) times daily with a meal.    Final diagnoses:   Right-sided low back pain without sciatica   This is a 30 y.o. female who presents to the ED complaining of right mid to lower back pain since waking up this morning. She complains of 9/10 pain that is constant and worse with movement. She has taken nothing for treatment today. She denies any known injury to her back. She denies any radiating pain. She denies any urinary symptoms. Patient with one day of back pain that is worse with movement. On exam the patient is afebrile and nontoxic-appearing. No focal neurological deficits.  No loss of bowel or bladder control.  No concern for cauda equina.  No fever, night sweats, weight loss, h/o cancer, IVDU. Urinalysis is within normal limits. No hematuria. Urine pregnancy test is negative. Will provide with one Percocet and discharge prescriptions for Robaxin and naproxen. I advised that if her pain continues she should follow-up with primary care. I advised that if her pain worsens or she has new concerns or changes she should return to the emergency department. I discussed return precautions. I advised the patient to follow-up with their primary care provider this week. I advised the patient to return to the emergency department with new or worsening symptoms or new concerns. The patient verbalized understanding and agreement with plan.       Everlene FarrierWilliam Imajean Mcdermid, PA-C 02/17/16 [id number]  Pricilla LovelessScott Goldston, MD 02/17/16 [phone]

## 2016-02-17 NOTE — ED Notes (Signed)
Pt. reports right back pain onset this morning , denies injury or hematuria , no dysuria or strenuous activity . Pain increases with movement . Respirations unlabored /ambulatory .

## 2016-03-05 ENCOUNTER — Other Ambulatory Visit: Payer: Self-pay | Admitting: Nurse Practitioner

## 2016-03-05 DIAGNOSIS — E282 Polycystic ovarian syndrome: Secondary | ICD-10-CM

## 2016-03-09 ENCOUNTER — Ambulatory Visit: Admission: RE | Admit: 2016-03-09 | Payer: Medicaid Other | Source: Ambulatory Visit

## 2016-03-11 ENCOUNTER — Ambulatory Visit
Admission: RE | Admit: 2016-03-11 | Discharge: 2016-03-11 | Disposition: A | Discharge status: Home / Self Care | Payer: Medicaid Other | Source: Ambulatory Visit | Attending: Nurse Practitioner | Admitting: Nurse Practitioner

## 2016-03-11 DIAGNOSIS — E282 Polycystic ovarian syndrome: Secondary | ICD-10-CM | POA: Diagnosis present

## 2016-03-11 DIAGNOSIS — N839 Noninflammatory disorder of ovary, fallopian tube and broad ligament, unspecified: Secondary | ICD-10-CM | POA: Diagnosis not present

## 2016-03-11 DIAGNOSIS — N858 Other specified noninflammatory disorders of uterus: Secondary | ICD-10-CM | POA: Diagnosis not present

## 2016-07-21 ENCOUNTER — Encounter: Payer: Self-pay | Admitting: Medical Oncology

## 2016-07-21 ENCOUNTER — Emergency Department
Admission: EM | Admit: 2016-07-21 | Discharge: 2016-07-21 | Disposition: A | Discharge status: Home / Self Care | Payer: Medicaid Other | Attending: Emergency Medicine | Admitting: Emergency Medicine

## 2016-07-21 DIAGNOSIS — H9202 Otalgia, left ear: Secondary | ICD-10-CM | POA: Insufficient documentation

## 2016-07-21 MED ORDER — NAPROXEN 500 MG PO TABS: 500.0000 mg | ORAL_TABLET | Freq: Two times a day (BID) | ORAL | 0 refills | Status: DC

## 2016-07-21 MED ORDER — CIPROFLOXACIN-DEXAMETHASONE 0.3-0.1 % OT SUSP: 4.0000 [drp] | Freq: Two times a day (BID) | OTIC | 0 refills | Status: DC

## 2016-07-21 NOTE — ED Provider Notes (Signed)
[facility] Emergency Department Provider Note ____________________________________________  Time seen: Approximately 9:00 AM  I have reviewed the triage vital signs and the nursing notes.   HISTORY  Chief Complaint Otalgia    HPI [name] is a 30 y.o. female who presents to the emergency department for evaluation of left ear pain for the past 2 days. She was evaluated by her PCP yesterday who prescribed some antibiotic ear drops, but she has no relief. Ibuprofen isn't helping either. She has an appointment with ENT next Wednesday, but she is unable to tolerate the pain.She is currently taking doxycycline for a skin infection.  Past Medical History:  Diagnosis Date  . Foot pain   . Gastritis [id number]  . GERD (gastroesophageal reflux disease)   . Seasonal allergies     Patient Active Problem List   Diagnosis Date Noted  . Acute perforated appendicitis 05/25/2015  . Abdominal pain, left upper quadrant 02/21/2014  . Splenomegaly 02/21/2014  . Generalized anxiety disorder 02/21/2014  . Hemorrhage of rectum and anus 02/21/2014  . Lump or mass in breast 05/01/2013    Past Surgical History:  Procedure Laterality Date  . APPENDECTOMY N/A 05/25/2015   Procedure: APPENDECTOMY;  Surgeon: Manus Rudd, MD;  Location: [facility] OR;  Service: General;  Laterality: N/A;  . BREAST BIOPSY Right 05/03/13  . FOOT SURGERY    . WRIST SURGERY      Prior to Admission medications   Medication Sig Start Date End Date Taking? Authorizing Provider  ciprofloxacin-dexamethasone (CIPRODEX) otic suspension Place 4 drops into the left ear 2 (two) times daily. 07/21/16   Chinita Pester, FNP  cyclobenzaprine (FLEXERIL) 5 MG tablet Take 5 mg by mouth 3 (three) times daily as needed for muscle spasms.    Historical Provider, MD  fexofenadine (ALLEGRA) 180 MG tablet Take 180 mg by mouth daily as needed for allergies or rhinitis.    Historical Provider, MD  guaiFENesin-codeine  (ROBITUSSIN AC) 100-10 MG/5ML syrup Take 5 mLs by mouth 3 (three) times daily as needed for cough. 02/03/16   Felicie Morn, NP  ketorolac (TORADOL) 10 MG tablet Take 1 tablet (10 mg total) by mouth every 8 (eight) hours. 01/28/16   Jenise V Bacon Menshew, PA-C  methocarbamol (ROBAXIN) 500 MG tablet Take 1 tablet (500 mg total) by mouth 2 (two) times daily as needed for muscle spasms. 02/17/16   Everlene Farrier, PA-C  metoCLOPramide (REGLAN) 5 MG tablet Take 1 tablet (5 mg total) by mouth every 8 (eight) hours as needed for nausea or vomiting. 01/28/16   Charlesetta Ivory Menshew, PA-C  naproxen (NAPROSYN) 500 MG tablet Take 1 tablet (500 mg total) by mouth 2 (two) times daily with a meal. 07/21/16   Daenerys Buttram B Delphin Funes, FNP  omeprazole (PRILOSEC) 20 MG capsule Take 20 mg by mouth daily.    Historical Provider, MD  ondansetron (ZOFRAN ODT) 4 MG disintegrating tablet 4mg  ODT q4 hours prn nausea/vomit Patient taking differently: Take 4 mg by mouth every 4 (four) hours as needed for nausea or vomiting. 4mg  ODT q4 hours prn nausea/vomit 06/13/15   Joycie Peek, PA-C  oxyCODONE (ROXICODONE) 15 MG immediate release tablet Take 15 mg by mouth every 4 (four) hours as needed for pain.    Historical Provider, MD  predniSONE (DELTASONE) 50 MG tablet Take 1 tablet by mouth one time daily for 7 days 08/23/15   Barbaraann Barthel, MD  promethazine (PHENERGAN) 12.5 MG tablet Take 1 tablet (12.5 mg total) by  mouth every 6 (six) hours as needed for nausea or vomiting. 06/29/15   Jennye Moccasin, MD  promethazine (PHENERGAN) 25 MG suppository Place 1 suppository (25 mg total) rectally every 6 (six) hours as needed for nausea or vomiting. 06/29/15   Jennye Moccasin, MD    Allergies Morphine and related  Family History  Problem Relation Age of Onset  . Hypertension Father   . Diabetes Father   . Liver cancer Paternal Grandfather   . Diabetes Maternal Grandmother   . Kidney failure Maternal Grandmother     Social History Social  History  Substance Use Topics  . Smoking status: Never Smoker  . Smokeless tobacco: Never Used  . Alcohol use Yes     Comment: occ    Review of Systems Constitutional: Negative for fever/chills Eyes: No visual changes. ENT: Positive for left earache. Gastrointestinal: No abdominal pain.  No nausea, no vomiting.  No diarrhea.  No constipation. Musculoskeletal: Negative for pain. Skin: Negative for rash. Neurological: Negative for headaches, focal weakness or numbness. ____________________________________________   PHYSICAL EXAM:  VITAL SIGNS: ED Triage Vitals  Enc Vitals Group     BP 07/21/16 0801 134/88     Pulse Rate 07/21/16 0801 89     Resp 07/21/16 0801 16     Temp 07/21/16 0801 97.9 F (36.6 C)     Temp Source 07/21/16 0801 Oral     SpO2 07/21/16 0801 99 %     Weight 07/21/16 0802 248 lb (112.5 kg)     Height 07/21/16 0802 5\' 6"  (1.676 m)     Head Circumference --      Peak Flow --      Pain Score 07/21/16 0802 7     Pain Loc --      Pain Edu? --      Excl. in GC? --     Constitutional: Alert and oriented. Well appearing and in no acute distress. Eyes: Conjunctivae are normal. PERRL. EOMI. Ears: No pain with movement of either auricle:; External canal patent;  Right TM normal; Left TM purulent drainage noted on the surface of the TM.   Head: Atraumatic. Nose: No congestion/rhinnorhea. Mouth/Throat: Mucous membranes are moist.  Oropharynx non-erythematous. Hematological/Lymphatic/Immunilogical: No cervical lymphadenopathy. Cardiovascular: Normal capillary refill. Respiratory: Normal respiratory effort.  No retractions.  Neurologic:  Normal speech and language. No gross focal neurologic deficits are appreciated. Speech is normal. No gait instability. Skin:  Skin is warm, dry and intact. No rash noted. ____________________________________________   LABS (all labs ordered are listed, but only abnormal results are displayed)  Labs Reviewed - No data to  display ____________________________________________   RADIOLOGY  Not indicated. ____________________________________________   PROCEDURES  Procedure(s) performed: None  ____________________________________________   INITIAL IMPRESSION / ASSESSMENT AND PLAN / ED COURSE  Pertinent labs & imaging results that were available during my care of the patient were reviewed by me and considered in my medical decision making (see chart for details).  Clinical Course    Prescriptions for ciprodex will be given today.  The patient was advised to follow up with her PCP or ENT for symptoms that are not improving over the next 48 hours. She was advised to return to the emergency department for symptoms that change or worsen if unable to schedule an appointment.  ____________________________________________   FINAL CLINICAL IMPRESSION(S) / ED DIAGNOSES  Final diagnoses:  Otalgia of left ear    Note:  This document was prepared using Dragon voice recognition software and  may include unintentional dictation errors.     Chinita PesterCari B Ante Arredondo, FNP 07/21/16 [phone]    Sharyn CreamerMark Quale, MD 07/21/16 [phone]

## 2016-07-21 NOTE — ED Triage Notes (Signed)
Pt reports left ear pain x 2 days

## 2016-08-11 ENCOUNTER — Emergency Department (HOSPITAL_COMMUNITY)
Admission: EM | Admit: 2016-08-11 | Discharge: 2016-08-11 | Disposition: A | Discharge status: Home / Self Care | Payer: Medicaid Other | Attending: Emergency Medicine | Admitting: Emergency Medicine

## 2016-08-11 ENCOUNTER — Encounter (HOSPITAL_COMMUNITY): Payer: Self-pay | Admitting: *Deleted

## 2016-08-11 ENCOUNTER — Emergency Department (HOSPITAL_COMMUNITY): Payer: Medicaid Other

## 2016-08-11 DIAGNOSIS — Z79899 Other long term (current) drug therapy: Secondary | ICD-10-CM | POA: Insufficient documentation

## 2016-08-11 DIAGNOSIS — R109 Unspecified abdominal pain: Secondary | ICD-10-CM

## 2016-08-11 DIAGNOSIS — N83201 Unspecified ovarian cyst, right side: Secondary | ICD-10-CM

## 2016-08-11 DIAGNOSIS — N83291 Other ovarian cyst, right side: Secondary | ICD-10-CM | POA: Diagnosis not present

## 2016-08-11 LAB — URINE MICROSCOPIC-ADD ON

## 2016-08-11 LAB — COMPREHENSIVE METABOLIC PANEL
ALT: 40 U/L (ref 14–54)
AST: 27 U/L (ref 15–41)
Albumin: 3.8 g/dL (ref 3.5–5.0)
Alkaline Phosphatase: 66 U/L (ref 38–126)
Anion gap: 7 (ref 5–15)
BUN: 9 mg/dL (ref 6–20)
CO2: 26 mmol/L (ref 22–32)
Calcium: 9.6 mg/dL (ref 8.9–10.3)
Chloride: 106 mmol/L (ref 101–111)
Creatinine, Ser: 0.85 mg/dL (ref 0.44–1.00)
GFR calc Af Amer: >60 mL/min (ref >60)
GFR calc non Af Amer: >60 mL/min (ref >60)
Glucose, Bld: 108 mg/dL — ABNORMAL HIGH (ref 65–99)
Potassium: 3.9 mmol/L (ref 3.5–5.1)
Sodium: 139 mmol/L (ref 135–145)
Total Bilirubin: 0.7 mg/dL (ref 0.3–1.2)
Total Protein: 6.5 g/dL (ref 6.5–8.1)

## 2016-08-11 LAB — POC URINE PREG, ED: Preg Test, Ur: NEGATIVE

## 2016-08-11 LAB — CBC
HCT: 38.8 % (ref 36.0–46.0)
Hemoglobin: 13 g/dL (ref 12.0–15.0)
MCH: 28.4 pg (ref 26.0–34.0)
MCHC: 33.5 g/dL (ref 30.0–36.0)
MCV: 84.7 fL (ref 78.0–100.0)
Platelets: 230 10*3/uL (ref 150–400)
RBC: 4.58 MIL/uL (ref 3.87–5.11)
RDW: 13.5 % (ref 11.5–15.5)
WBC: 6.9 10*3/uL (ref 4.0–10.5)

## 2016-08-11 LAB — URINALYSIS, ROUTINE W REFLEX MICROSCOPIC
Bilirubin Urine: NEGATIVE
Glucose, UA: NEGATIVE mg/dL
Ketones, ur: 15 mg/dL — AB
Leukocytes, UA: NEGATIVE
Nitrite: NEGATIVE
Protein, ur: NEGATIVE mg/dL
Specific Gravity, Urine: 1.035 — ABNORMAL HIGH (ref 1.005–1.030)
pH: 5.5 (ref 5.0–8.0)

## 2016-08-11 LAB — LIPASE, BLOOD: Lipase: 30 U/L (ref 11–51)

## 2016-08-11 MED ORDER — HYDROMORPHONE HCL 1 MG/ML IJ SOLN
2.0000 mg | Freq: Once | INTRAMUSCULAR | Status: AC
  Administered 2016-08-11: 2 mg via INTRAMUSCULAR
  Filled 2016-08-11: qty 2

## 2016-08-11 MED ORDER — ONDANSETRON 4 MG PO TBDP
4.0000 mg | ORAL_TABLET | Freq: Once | ORAL | Status: AC
  Administered 2016-08-11: 4 mg via ORAL
  Filled 2016-08-11: qty 1

## 2016-08-11 MED ORDER — IBUPROFEN 600 MG PO TABS: 600.0000 mg | ORAL_TABLET | Freq: Four times a day (QID) | ORAL | 0 refills | Status: DC | PRN

## 2016-08-11 NOTE — ED Provider Notes (Signed)
[facility]-EMERGENCY DEPT Provider Note   CSN: [id number] Arrival date & time: 08/11/16  0141  By signing my name below, I, [name], attest that this documentation has been prepared under the direction and in the presence of Derwood KaplanAnkit Aleyza Salmi, MD. Electronically Signed: Doreatha MartinEva [name], ED Scribe. 08/11/16. 3:37 AM.     History   Chief Complaint Chief Complaint  Patient presents with  . Diarrhea  . Flank Pain    HPI [name] is a 30 y.o. female with h/o appendectomy who presents to the Emergency Department complaining of moderate, intermittent right-sided abdominal pain onset a week ago and worsened yesterday to be constant. She also complains of occasional nausea, diarrhea (x2-5/day) for 5 days, one episode of "bright red" blood in her stool. Pt states her pain is unaffected by positional changes, food and ambulation. She states she has not noticed any exacerbation of pain with eating certain foods. No recent travel or suspicious food intake. She denies vomiting, melena, dysuria, hematuria, vaginal bleeding or discharge. No concern for pregnancy. Pt states she takes phentermine and reports she has not had any issues with this medication.   The history is provided by the patient. No language interpreter was used.    Past Medical History:  Diagnosis Date  . Foot pain   . Gastritis [id number]  . GERD (gastroesophageal reflux disease)   . Seasonal allergies     Patient Active Problem List   Diagnosis Date Noted  . Acute perforated appendicitis 05/25/2015  . Abdominal pain, left upper quadrant 02/21/2014  . Splenomegaly 02/21/2014  . Generalized anxiety disorder 02/21/2014  . Hemorrhage of rectum and anus 02/21/2014  . Lump or mass in breast 05/01/2013    Past Surgical History:  Procedure Laterality Date  . APPENDECTOMY N/A 05/25/2015   Procedure: APPENDECTOMY;  Surgeon: Manus RuddMatthew Tsuei, MD;  [facility];  Service: General;  Laterality: N/A;  . BREAST BIOPSY Right 05/03/13    . FOOT SURGERY    . WRIST SURGERY      OB History    Gravida Para Term Preterm AB Living   2 1       1    SAB TAB Ectopic Multiple Live Births                  Obstetric Comments   1st Menstrual Cycle:  12 1st Pregnancy: 20       Home Medications    Prior to Admission medications   Medication Sig Start Date End Date Taking? Authorizing Provider  fexofenadine (ALLEGRA) 180 MG tablet Take 180 mg by mouth daily as needed for allergies or rhinitis.   Yes Historical Provider, MD  ketorolac (TORADOL) 10 MG tablet Take 1 tablet (10 mg total) by mouth every 8 (eight) hours. 01/28/16  Yes Jenise V Bacon Menshew, PA-C  naproxen (NAPROSYN) 500 MG tablet Take 1 tablet (500 mg total) by mouth 2 (two) times daily with a meal. 07/21/16  Yes Cari B Triplett, FNP  omeprazole (PRILOSEC) 20 MG capsule Take 20 mg by mouth daily.   Yes Historical Provider, MD  phentermine 37.5 MG capsule Take 37.5 mg by mouth daily.   Yes Historical Provider, MD  ciprofloxacin-dexamethasone (CIPRODEX) otic suspension Place 4 drops into the left ear 2 (two) times daily. Patient not taking: Reported on 08/11/2016 07/21/16   Chinita Pesterari B Triplett, FNP  guaiFENesin-codeine (ROBITUSSIN AC) 100-10 MG/5ML syrup Take 5 mLs by mouth 3 (three) times daily as needed for cough. Patient not taking: Reported on 08/11/2016  02/03/16   Felicie Morn, NP  ibuprofen (ADVIL,MOTRIN) 600 MG tablet Take 1 tablet (600 mg total) by mouth every 6 (six) hours as needed. 08/11/16   Derwood Kaplan, MD  methocarbamol (ROBAXIN) 500 MG tablet Take 1 tablet (500 mg total) by mouth 2 (two) times daily as needed for muscle spasms. Patient not taking: Reported on 08/11/2016 02/17/16   Everlene Farrier, PA-C  metoCLOPramide (REGLAN) 5 MG tablet Take 1 tablet (5 mg total) by mouth every 8 (eight) hours as needed for nausea or vomiting. Patient not taking: Reported on 08/11/2016 01/28/16   Charlesetta Ivory Menshew, PA-C  ondansetron (ZOFRAN ODT) 4 MG disintegrating tablet  4mg  ODT q4 hours prn nausea/vomit Patient not taking: Reported on 08/11/2016 06/13/15   Joycie Peek, PA-C  predniSONE (DELTASONE) 50 MG tablet Take 1 tablet by mouth one time daily for 7 days Patient not taking: Reported on 08/11/2016 08/23/15   Barbaraann Barthel, MD  promethazine (PHENERGAN) 12.5 MG tablet Take 1 tablet (12.5 mg total) by mouth every 6 (six) hours as needed for nausea or vomiting. Patient not taking: Reported on 08/11/2016 06/29/15   Jennye Moccasin, MD  promethazine (PHENERGAN) 25 MG suppository Place 1 suppository (25 mg total) rectally every 6 (six) hours as needed for nausea or vomiting. Patient not taking: Reported on 08/11/2016 06/29/15   Jennye Moccasin, MD    Family History Family History  Problem Relation Age of Onset  . Hypertension Father   . Diabetes Father   . Liver cancer Paternal Grandfather   . Diabetes Maternal Grandmother   . Kidney failure Maternal Grandmother     Social History Social History  Substance Use Topics  . Smoking status: Never Smoker  . Smokeless tobacco: Never Used  . Alcohol use Yes     Comment: occ     Allergies   Morphine and related   Review of Systems Review of Systems A complete 10 system review of systems was obtained and all systems are negative except as noted in the HPI and PMH.    Physical Exam Updated Vital Signs BP 142/86   Pulse 81   Temp 98.8 F (37.1 C) (Oral)   Resp 16   Ht 5\' 6"  (1.676 m)   Wt 248 lb (112.5 kg)   LMP 08/06/2016 (Exact Date)   SpO2 97%   BMI 40.03 kg/m   Physical Exam  Constitutional: She appears well-developed and well-nourished.  HENT:  Head: Normocephalic.  Eyes: Conjunctivae are normal.  Cardiovascular: Normal rate, regular rhythm and normal heart sounds.   RRR  Pulmonary/Chest: Effort normal and breath sounds normal. No respiratory distress.  Lungs CTA bilaterally.   Abdominal: She exhibits no distension.  Positive bowel sounds.   Musculoskeletal: Normal range of  motion.  Neurological: She is alert.  Skin: Skin is warm and dry.  Psychiatric: She has a normal mood and affect. Her behavior is normal.  Nursing note and vitals reviewed.    ED Treatments / Results   DIAGNOSTIC STUDIES: Oxygen Saturation is 100% on RA, normal by my interpretation.    COORDINATION OF CARE: 3:36 AM Discussed treatment plan with pt at bedside which includes lab work and pt agreed to plan.    Labs (all labs ordered are listed, but only abnormal results are displayed) Labs Reviewed  COMPREHENSIVE METABOLIC PANEL - Abnormal; Notable for the following:       Result Value   Glucose, Bld 108 (*)    All other components within normal  limits  URINALYSIS, ROUTINE W REFLEX MICROSCOPIC (NOT AT [facility]) - Abnormal; Notable for the following:    APPearance CLOUDY (*)    Specific Gravity, Urine 1.035 (*)    Hgb urine dipstick LARGE (*)    Ketones, ur 15 (*)    All other components within normal limits  URINE MICROSCOPIC-ADD ON - Abnormal; Notable for the following:    Squamous Epithelial / LPF 0-5 (*)    Bacteria, UA FEW (*)    Crystals CA OXALATE CRYSTALS (*)    All other components within normal limits  LIPASE, BLOOD  CBC  POC URINE PREG, ED    EKG  EKG Interpretation None       Radiology No results found.  Procedures Procedures (including critical care time)  Medications Ordered in ED Medications  HYDROmorphone (DILAUDID) injection 2 mg (2 mg Intramuscular Given 08/11/16 0405)  ondansetron (ZOFRAN-ODT) disintegrating tablet 4 mg (4 mg Oral Given 08/11/16 0405)     Initial Impression / Assessment and Plan / ED Course  I have reviewed the triage vital signs and the nursing notes.  Pertinent labs & imaging results that were available during my care of the patient were reviewed by me and considered in my medical decision making (see chart for details).  Clinical Course    I personally performed the services described in this documentation, which was  scribed in my presence. The recorded information has been reviewed and is accurate.  DDx includes: Pancreatitis Hepatobiliary pathology including cholecystitis Gastritis/PUD SBO Ovarian cyst Ureteral colic Ectopic pregnancy  Pt with R sided abd pain. Tenderness over the upper quadrant. [place] ordered - and is neg for acute process. Pt reassessed - lab results discussed. Labs are reassuring and normal. VSS and wnl. Repeat exam - pt reports pain is also present in the lower part of her abd. Neg Mcburney's. Pt reports that she has had ovarian cyst in the past. She has no risk for STDs, denies vaginal discharge. We will get US pelvis - if neg, pt will be d/c with outpatient f.u.  Final Clinical Impressions(s) / ED Diagnoses   Final diagnoses:  Abdominal pain  Right sided abdominal pain  Cyst of right ovary    New Prescriptions Discharge Medication List as of 08/11/2016  6:46 AM    START taking these medications   Details  ibuprofen (ADVIL,MOTRIN) 600 MG tablet Take 1 tablet (600 mg total) by mouth every 6 (six) hours as needed., Starting Wed 08/11/2016, Print          Derwood Kaplan, MD 08/13/16 [phone]

## 2016-08-11 NOTE — ED Notes (Signed)
Patient transported to Ultrasound via stretcher 

## 2016-08-11 NOTE — Discharge Instructions (Signed)
You have an ovarian cyst on the right side, please see a gynecologist for further evaluation.

## 2016-08-11 NOTE — ED Triage Notes (Signed)
Pt reports diarrhea and right flank pain for about 1 week. Pt states 1 episode of bright red blood in her stool yesterday. No vomiting.

## 2016-08-13 ENCOUNTER — Ambulatory Visit
Admission: RE | Admit: 2016-08-13 | Discharge: 2016-08-13 | Disposition: A | Discharge status: Home / Self Care | Payer: Medicaid Other | Source: Ambulatory Visit | Attending: Physician Assistant | Admitting: Physician Assistant

## 2016-08-13 ENCOUNTER — Encounter: Payer: Self-pay | Admitting: *Deleted

## 2016-08-13 ENCOUNTER — Emergency Department
Admission: EM | Admit: 2016-08-13 | Discharge: 2016-08-14 | Disposition: A | Discharge status: Home / Self Care | Payer: Medicaid Other | Attending: Emergency Medicine | Admitting: Emergency Medicine

## 2016-08-13 ENCOUNTER — Other Ambulatory Visit: Payer: Self-pay | Admitting: Physician Assistant

## 2016-08-13 DIAGNOSIS — R109 Unspecified abdominal pain: Secondary | ICD-10-CM

## 2016-08-13 DIAGNOSIS — R319 Hematuria, unspecified: Secondary | ICD-10-CM

## 2016-08-13 DIAGNOSIS — Z79899 Other long term (current) drug therapy: Secondary | ICD-10-CM | POA: Insufficient documentation

## 2016-08-13 LAB — COMPREHENSIVE METABOLIC PANEL
ALT: 49 U/L (ref 14–54)
AST: 33 U/L (ref 15–41)
Albumin: 4.3 g/dL (ref 3.5–5.0)
Alkaline Phosphatase: 66 U/L (ref 38–126)
Anion gap: 8 (ref 5–15)
BUN: 10 mg/dL (ref 6–20)
CO2: 25 mmol/L (ref 22–32)
Calcium: 9.3 mg/dL (ref 8.9–10.3)
Chloride: 107 mmol/L (ref 101–111)
Creatinine, Ser: 0.89 mg/dL (ref 0.44–1.00)
GFR calc Af Amer: >60 mL/min (ref >60)
GFR calc non Af Amer: >60 mL/min (ref >60)
Glucose, Bld: 89 mg/dL (ref 65–99)
Potassium: 3.6 mmol/L (ref 3.5–5.1)
Sodium: 140 mmol/L (ref 135–145)
Total Bilirubin: 0.8 mg/dL (ref 0.3–1.2)
Total Protein: 7.3 g/dL (ref 6.5–8.1)

## 2016-08-13 LAB — CBC
HCT: 40 % (ref 35.0–47.0)
Hemoglobin: 13.7 g/dL (ref 12.0–16.0)
MCH: 28.6 pg (ref 26.0–34.0)
MCHC: 34.2 g/dL (ref 32.0–36.0)
MCV: 83.6 fL (ref 80.0–100.0)
Platelets: 226 10*3/uL (ref 150–440)
RBC: 4.79 MIL/uL (ref 3.80–5.20)
RDW: 14.1 % (ref 11.5–14.5)
WBC: 8.7 10*3/uL (ref 3.6–11.0)

## 2016-08-13 LAB — LIPASE, BLOOD: Lipase: 24 U/L (ref 11–51)

## 2016-08-13 NOTE — ED Triage Notes (Signed)
Pt to triage via wheelchair.  Pt reports right side abd pain for 2 weeks.  Pt states pain is worse today  Pt had u/s and xray today at [place].  No vag bleeding.  No vag discharge.  No dysuria.  Pt alert. Tearful in triage.

## 2016-08-14 ENCOUNTER — Emergency Department: Payer: Medicaid Other

## 2016-08-14 LAB — URINALYSIS COMPLETE WITH MICROSCOPIC (ARMC ONLY)
Bacteria, UA: NONE SEEN
Bilirubin Urine: NEGATIVE
Glucose, UA: NEGATIVE mg/dL
Ketones, ur: NEGATIVE mg/dL
Leukocytes, UA: NEGATIVE
Nitrite: NEGATIVE
Protein, ur: 30 mg/dL — AB
Specific Gravity, Urine: 1.015 (ref 1.005–1.030)
pH: 5 (ref 5.0–8.0)

## 2016-08-14 LAB — POCT PREGNANCY, URINE: Preg Test, Ur: NEGATIVE

## 2016-08-14 MED ORDER — OXYCODONE-ACETAMINOPHEN 5-325 MG PO TABS
2.0000 | ORAL_TABLET | Freq: Once | ORAL | Status: AC
  Administered 2016-08-14: 2 via ORAL

## 2016-08-14 MED ORDER — ONDANSETRON HCL 4 MG PO TABS
ORAL_TABLET | ORAL | Status: AC
  Filled 2016-08-14: qty 1

## 2016-08-14 MED ORDER — LIDOCAINE 5 % EX PTCH
1.0000 | MEDICATED_PATCH | CUTANEOUS | Status: DC
  Administered 2016-08-14: 1 via TRANSDERMAL
  Filled 2016-08-14: qty 1

## 2016-08-14 MED ORDER — OXYCODONE-ACETAMINOPHEN 5-325 MG PO TABS
2.0000 | ORAL_TABLET | ORAL | Status: DC
Start: 2016-08-14 — End: 2016-08-14

## 2016-08-14 MED ORDER — ONDANSETRON 4 MG PO TBDP
4.0000 mg | ORAL_TABLET | Freq: Once | ORAL | Status: AC
  Administered 2016-08-14: 4 mg via ORAL
  Filled 2016-08-14: qty 1

## 2016-08-14 MED ORDER — HYDROMORPHONE HCL 1 MG/ML IJ SOLN
1.0000 mg | INTRAMUSCULAR | Status: AC
  Administered 2016-08-14: 1 mg via INTRAMUSCULAR
  Filled 2016-08-14: qty 1

## 2016-08-14 MED ORDER — ONDANSETRON 4 MG PO TBDP: ORAL_TABLET | ORAL | 0 refills | Status: DC

## 2016-08-14 MED ORDER — OXYCODONE-ACETAMINOPHEN 5-325 MG PO TABS: 1.0000 | ORAL_TABLET | ORAL | 0 refills | Status: DC | PRN

## 2016-08-14 MED ORDER — OXYCODONE-ACETAMINOPHEN 5-325 MG PO TABS
ORAL_TABLET | ORAL | Status: AC
  Administered 2016-08-14: 2 via ORAL
  Filled 2016-08-14: qty 2

## 2016-08-14 MED ORDER — DOCUSATE SODIUM 100 MG PO CAPS: ORAL_CAPSULE | ORAL | 0 refills | Status: DC

## 2016-08-14 MED ORDER — LIDOCAINE 5 % EX PTCH: 1.0000 | MEDICATED_PATCH | Freq: Two times a day (BID) | CUTANEOUS | 0 refills | Status: DC

## 2016-08-14 NOTE — ED Provider Notes (Signed)
[facility] Emergency Department Provider Note  ____________________________________________   First MD Initiated Contact with Patient 08/14/16 0102     (approximate)  I have reviewed the triage vital signs and the nursing notes.   HISTORY  Chief Complaint Abdominal Pain    HPI [name] is a 30 y.o. female who presents for evaluation of right flank pain radiating down into her right lower abdomen.  It has been going on for 2 weeks and waxes and wanes in intensity from mild to severe.  Sometimes it completely goes away but then it comes back.  She has been seen at [facility] is recently over the last few days and had a right upper quadrant ultrasound as well as pelvic ultrasounds and had abdominal x-rays performed today as well.  All of her workup has been negative up to this point.  She says that nothing is making the pain better and nothing is making it worse, just comes and goes on its own.  She has had some diarrhea at times.  She has no history of kidney stones but deaf identifies her right flank as being the area from which the pain seems to be coming.  She denies fever/chills, chest pain, shortness of breath, vaginal bleeding, vaginal discharge.   Past Medical History:  Diagnosis Date  . Foot pain   . Gastritis [id number]  . GERD (gastroesophageal reflux disease)   . Seasonal allergies     Patient Active Problem List   Diagnosis Date Noted  . Acute perforated appendicitis 05/25/2015  . Abdominal pain, left upper quadrant 02/21/2014  . Splenomegaly 02/21/2014  . Generalized anxiety disorder 02/21/2014  . Hemorrhage of rectum and anus 02/21/2014  . Lump or mass in breast 05/01/2013    Past Surgical History:  Procedure Laterality Date  . APPENDECTOMY N/A 05/25/2015   Procedure: APPENDECTOMY;  Surgeon: Manus Rudd, MD;  Location: [facility] OR;  Service: General;  Laterality: N/A;  . BREAST BIOPSY Right 05/03/13  . FOOT SURGERY    . WRIST  SURGERY      Prior to Admission medications   Medication Sig Start Date End Date Taking? Authorizing Provider  ciprofloxacin-dexamethasone (CIPRODEX) otic suspension Place 4 drops into the left ear 2 (two) times daily. Patient not taking: Reported on 08/11/2016 07/21/16   Chinita Pester, FNP  docusate sodium (COLACE) 100 MG capsule Take 1 tablet once or twice daily as needed for constipation while taking narcotic pain medicine 08/14/16   Loleta Rose, MD  fexofenadine (ALLEGRA) 180 MG tablet Take 180 mg by mouth daily as needed for allergies or rhinitis.    Historical Provider, MD  guaiFENesin-codeine (ROBITUSSIN AC) 100-10 MG/5ML syrup Take 5 mLs by mouth 3 (three) times daily as needed for cough. Patient not taking: Reported on 08/11/2016 02/03/16   Felicie Morn, NP  ibuprofen (ADVIL,MOTRIN) 600 MG tablet Take 1 tablet (600 mg total) by mouth every 6 (six) hours as needed. 08/11/16   Derwood Kaplan, MD  ketorolac (TORADOL) 10 MG tablet Take 1 tablet (10 mg total) by mouth every 8 (eight) hours. 01/28/16   Jenise V Bacon Menshew, PA-C  lidocaine (LIDODERM) 5 % Place 1 patch onto the skin every 12 (twelve) hours. Remove & Discard patch within 12 hours or as directed by MD 08/14/16 08/14/17  Loleta Rose, MD  methocarbamol (ROBAXIN) 500 MG tablet Take 1 tablet (500 mg total) by mouth 2 (two) times daily as needed for muscle spasms. Patient not taking: Reported on 08/11/2016  02/17/16   Everlene Farrier, PA-C  metoCLOPramide (REGLAN) 5 MG tablet Take 1 tablet (5 mg total) by mouth every 8 (eight) hours as needed for nausea or vomiting. Patient not taking: Reported on 08/11/2016 01/28/16   Charlesetta Ivory Menshew, PA-C  naproxen (NAPROSYN) 500 MG tablet Take 1 tablet (500 mg total) by mouth 2 (two) times daily with a meal. 07/21/16   Cari B Triplett, FNP  omeprazole (PRILOSEC) 20 MG capsule Take 20 mg by mouth daily.    Historical Provider, MD  ondansetron (ZOFRAN ODT) 4 MG disintegrating tablet Allow 1-2  tablets to dissolve in your mouth every 8 hours as needed for nausea/vomiting 08/14/16   Loleta Rose, MD  oxyCODONE-acetaminophen (ROXICET) 5-325 MG tablet Take 1-2 tablets by mouth every 4 (four) hours as needed for severe pain. 08/14/16   Loleta Rose, MD  phentermine 37.5 MG capsule Take 37.5 mg by mouth daily.    Historical Provider, MD  predniSONE (DELTASONE) 50 MG tablet Take 1 tablet by mouth one time daily for 7 days Patient not taking: Reported on 08/11/2016 08/23/15   Barbaraann Barthel, MD  promethazine (PHENERGAN) 12.5 MG tablet Take 1 tablet (12.5 mg total) by mouth every 6 (six) hours as needed for nausea or vomiting. Patient not taking: Reported on 08/11/2016 06/29/15   Jennye Moccasin, MD  promethazine (PHENERGAN) 25 MG suppository Place 1 suppository (25 mg total) rectally every 6 (six) hours as needed for nausea or vomiting. Patient not taking: Reported on 08/11/2016 06/29/15   Jennye Moccasin, MD    Allergies Morphine and related  Family History  Problem Relation Age of Onset  . Hypertension Father   . Diabetes Father   . Liver cancer Paternal Grandfather   . Diabetes Maternal Grandmother   . Kidney failure Maternal Grandmother     Social History Social History  Substance Use Topics  . Smoking status: Never Smoker  . Smokeless tobacco: Never Used  . Alcohol use Yes     Comment: occ    Review of Systems Constitutional: No fever/chills Eyes: No visual changes. ENT: No sore throat. Cardiovascular: Denies chest pain. Respiratory: Denies shortness of breath. Gastrointestinal: No abdominal pain.  No nausea, no vomiting.  No diarrhea.  No constipation. Genitourinary: Negative for dysuria. Musculoskeletal: Right flank pain radiating to front of lower abdomen Skin: Negative for rash. Neurological: Negative for headaches, focal weakness or numbness.  10-point ROS otherwise negative.  ____________________________________________   PHYSICAL EXAM:  VITAL SIGNS: ED  Triage Vitals [08/13/16 2124]  Enc Vitals Group     BP (!) 143/98     Pulse Rate 95     Resp 18     Temp 98.9 F (37.2 C)     Temp Source Oral     SpO2 100 %     Weight 148 lb (67.1 kg)     Height 5\' 6"  (1.676 m)     Head Circumference      Peak Flow      Pain Score 10     Pain Loc      Pain Edu?      Excl. in GC?     Constitutional: Alert and oriented. Well appearing and in no acute distress. Eyes: Conjunctivae are normal. PERRL. EOMI. Head: Atraumatic. Nose: No congestion/rhinnorhea. Mouth/Throat: Mucous membranes are moist.  Oropharynx non-erythematous. Neck: No stridor.  No meningeal signs.   Cardiovascular: Normal rate, regular rhythm. Good peripheral circulation. Grossly normal heart sounds. Respiratory: Normal respiratory effort.  No retractions. Lungs CTAB. Gastrointestinal: Soft and nontender. No distention. +R CVA tenderness Musculoskeletal: No lower extremity tenderness nor edema. No gross deformities of extremities. Neurologic:  Normal speech and language. No gross focal neurologic deficits are appreciated.  Skin:  Skin is warm, dry and intact. No rash noted. Psychiatric: Mood and affect are normal. Speech and behavior are normal.  ____________________________________________   LABS (all labs ordered are listed, but only abnormal results are displayed)  Labs Reviewed  URINALYSIS COMPLETEWITH MICROSCOPIC ([facility] ONLY) - Abnormal; Notable for the following:       Result Value   Color, Urine YELLOW (*)    APPearance CLEAR (*)    Hgb urine dipstick 3+ (*)    Protein, ur 30 (*)    Squamous Epithelial / LPF 0-5 (*)    All other components within normal limits  LIPASE, BLOOD  COMPREHENSIVE METABOLIC PANEL  CBC  POC URINE PREG, ED  POCT PREGNANCY, URINE   ____________________________________________  EKG  None - EKG not ordered by ED physician ____________________________________________  RADIOLOGY   Dg Abd 2 Views  Result Date: 08/13/2016 CLINICAL  DATA:  Right flank pain and diarrhea, 2 weeks duration. EXAM: ABDOMEN - 2 VIEW COMPARISON:  Ultrasound 08/11/2016 FINDINGS: Bowel gas pattern is normal without evidence of ileus, obstruction or free air. Normal volume of fecal matter. Evidence of previous appendectomy. No abnormal soft tissue calcifications. Curvature the spine convex to the left with lower lumbar facet degeneration. IMPRESSION: No acute finding by radiography. No evidence of ileus, obstruction or free air. Electronically Signed   By: Paulina FusiMark  Shogry M.D.   On: 08/13/2016 13:11   Ct Renal Stone Study  Result Date: 08/14/2016 CLINICAL DATA:  30 y/o  F; 2 weeks of abdominal pain and nausea. EXAM: CT ABDOMEN AND PELVIS WITHOUT CONTRAST TECHNIQUE: Multidetector CT imaging of the abdomen and pelvis was performed following the standard protocol without IV contrast. COMPARISON:  06/13/2015 CT of abdomen and pelvis. FINDINGS: Lower chest: No acute abnormality. Hepatobiliary: No focal liver abnormality is seen. No gallstones, gallbladder wall thickening, or biliary dilatation. Pancreas: Unremarkable. No pancreatic ductal dilatation or surrounding inflammatory changes. Spleen: Borderline splenomegaly. Adrenals/Urinary Tract: Adrenal glands are unremarkable. Kidneys are normal, without renal calculi, focal lesion, or hydronephrosis. Bladder is unremarkable. Pelvic phleboliths. Stomach/Bowel: Stomach is within normal limits. Appendix resected. No evidence of bowel wall thickening, distention, or inflammatory changes. Vascular/Lymphatic: No significant vascular findings are present. No enlarged abdominal or pelvic lymph nodes. Reproductive: Uterus and bilateral adnexa are unremarkable. Other: Midline lower abdominal postsurgical changes in the anterior abdominal wall. Musculoskeletal: Mild levocurvature of the lumbar spine with apex at L3. No acute osseous abnormality is evident IMPRESSION: 1. No acute process identified as explanation for pain. 2. No urinary  stone disease or obstructive uropathy. 3. Nonspecific borderline splenomegaly. Electronically Signed   By: Mitzi HansenLance  Furusawa-Stratton M.D.   On: 08/14/2016 03:23    ____________________________________________   PROCEDURES  Procedure(s) performed:   Procedures   Critical Care performed: No ____________________________________________   INITIAL IMPRESSION / ASSESSMENT AND PLAN / ED COURSE  Pertinent labs & imaging results that were available during my care of the patient were reviewed by me and considered in my medical decision making (see chart for details).  The patient has artery had an extensive workup as an outpatient, but given the fact that she has some hematuria today and her symptoms are most consistent with kidney stones, I will further evaluate with a renal stone protocol CT scan of the  abdomen and pelvis.  Her vital signs are normal and her labs are all reassuring.  I doubt the presence of an emergent or acute medical condition but kidney stones makes the most sense.  She is too young with no risk factors to think that a condition such as renal ischemia would be present, and regardless her kidney should show some abnormalities even on noncontrast study.  I am giving her Percocet and will reassess after her workup.   Clinical Course  Comment By Time  The patient's workup has been completely unremarkable except for the hematuria.  She reports that she is still in pain and is tearful in the exam room but I explained that I have absolutely no abnormalities to go on.  We discussed various possibilities such as musculoskeletal pain or the possibility that she passed a stone and is still feeling the after effects which would explain the pain and the hematuria but why we did not see a stone on scan.  For comfort I am giving her a Lidoderm patch for the right flank and a shot of Dilaudid 1 mg intramuscular before discharge, but I have nothing to treat in terms of bringing her into the  hospital and she has persistently normal vital signs.  I reviewed the results of her ultrasound that she had a very small hemorrhagic cyst which could also be the culprit but there is no reason to suspect that she is having intermittent ovarian ischemia.  She has no tenderness to palpation of her lower abdomen.  He has no evidence of occult infection which would suggest, for example, a hematogenous spread of infection to her spine (vertebral osteomyelitis).  I gave my usual and customary return precautions. Loleta Rose, MD 10/14 0403    ____________________________________________  FINAL CLINICAL IMPRESSION(S) / ED DIAGNOSES  Final diagnoses:  Right flank pain  Hematuria, unspecified type  Abdominal pain, unspecified abdominal location     MEDICATIONS GIVEN DURING THIS VISIT:  Medications  lidocaine (LIDODERM) 5 % 1 patch (1 patch Transdermal Patch Applied 08/14/16 0406)  oxyCODONE-acetaminophen (PERCOCET/ROXICET) 5-325 MG per tablet 2 tablet (2 tablets Oral Given 08/14/16 0144)  ondansetron (ZOFRAN-ODT) disintegrating tablet 4 mg (4 mg Oral Given 08/14/16 0144)  HYDROmorphone (DILAUDID) injection 1 mg (1 mg Intramuscular Given 08/14/16 0406)     NEW OUTPATIENT MEDICATIONS STARTED DURING THIS VISIT:  New Prescriptions   DOCUSATE SODIUM (COLACE) 100 MG CAPSULE    Take 1 tablet once or twice daily as needed for constipation while taking narcotic pain medicine   LIDOCAINE (LIDODERM) 5 %    Place 1 patch onto the skin every 12 (twelve) hours. Remove & Discard patch within 12 hours or as directed by MD   ONDANSETRON (ZOFRAN ODT) 4 MG DISINTEGRATING TABLET    Allow 1-2 tablets to dissolve in your mouth every 8 hours as needed for nausea/vomiting   OXYCODONE-ACETAMINOPHEN (ROXICET) 5-325 MG TABLET    Take 1-2 tablets by mouth every 4 (four) hours as needed for severe pain.    Modified Medications   No medications on file    Discontinued Medications   ONDANSETRON (ZOFRAN ODT) 4 MG  DISINTEGRATING TABLET    4mg  ODT q4 hours prn nausea/vomit     Note:  This document was prepared using Dragon voice recognition software and may include unintentional dictation errors.    Loleta Rose, MD 08/14/16 [phone]

## 2016-08-14 NOTE — Discharge Instructions (Signed)
You have been seen in the Emergency Department (ED) for flank and abdominal pain.  Your evaluation did not identify a clear cause of your symptoms but was generally reassuring.  As we discussed, you have had an extensive evaluation which demonstrated no acute cause of your pain.  It is possible that you had a kidney stone which you passed but you are still feeling the effects.  Please follow up as instructed above regarding today?s emergent visit and the symptoms that are bothering you.  Return to the ED if your abdominal pain worsens or fails to improve, you develop bloody vomiting, bloody diarrhea, you are unable to tolerate fluids due to vomiting, fever greater than 101, or other symptoms that concern you.

## 2016-08-14 NOTE — ED Notes (Signed)
Pt reports abd pain with nausea for the last two weeks. [facility] for same complaint without being given a diagnosis. Was given a prescription but not sure for what and did not have it filled. Has also been seen by her GI doctor who sent her for an Xray to look for kidney stones but has not heard results. Called her GI doctor today because the pain was worse and was told to come to the ED because they feel like she needs a CT scan of her abd and they would not be able to get her scheduled for one for at least two weeks per her GI doctor. Pt reports that she has been prescribed medication for diarrhea recently but did not get it filled because she could not afford it. Pt denies any diarrhea currently, states, "My stool is more formed and less white." Denies any vomiting but endorses nausea. At time of assessment, put is holding RUQ, but does not appear to be in any acute distress. Skin is pink, warm and dry.

## 2016-08-14 NOTE — ED Notes (Signed)
Discharge instructions reviewed with patient. Patient verbalized understanding. Patient ambulated to lobby without difficulty.   

## 2016-08-16 DIAGNOSIS — K589 Irritable bowel syndrome without diarrhea: Secondary | ICD-10-CM | POA: Insufficient documentation

## 2016-08-31 ENCOUNTER — Ambulatory Visit: Payer: Self-pay | Admitting: Urology

## 2016-09-19 ENCOUNTER — Ambulatory Visit (HOSPITAL_COMMUNITY)
Admission: EM | Admit: 2016-09-19 | Discharge: 2016-09-19 | Disposition: A | Discharge status: Home / Self Care | Payer: Medicaid Other | Attending: Emergency Medicine | Admitting: Emergency Medicine

## 2016-09-19 ENCOUNTER — Encounter (HOSPITAL_COMMUNITY): Payer: Self-pay | Admitting: Emergency Medicine

## 2016-09-19 DIAGNOSIS — B349 Viral infection, unspecified: Secondary | ICD-10-CM | POA: Diagnosis not present

## 2016-09-19 DIAGNOSIS — J00 Acute nasopharyngitis [common cold]: Secondary | ICD-10-CM | POA: Diagnosis not present

## 2016-09-19 DIAGNOSIS — Z79899 Other long term (current) drug therapy: Secondary | ICD-10-CM | POA: Diagnosis not present

## 2016-09-19 DIAGNOSIS — Z9889 Other specified postprocedural states: Secondary | ICD-10-CM | POA: Insufficient documentation

## 2016-09-19 DIAGNOSIS — J029 Acute pharyngitis, unspecified: Secondary | ICD-10-CM | POA: Diagnosis not present

## 2016-09-19 LAB — POCT RAPID STREP A: Streptococcus, Group A Screen (Direct): NEGATIVE

## 2016-09-19 MED ORDER — GI COCKTAIL ~~LOC~~
30.0000 mL | Freq: Once | ORAL | Status: AC
  Administered 2016-09-19: 30 mL via ORAL

## 2016-09-19 MED ORDER — NAPROXEN 500 MG PO TBEC: 500.0000 mg | DELAYED_RELEASE_TABLET | Freq: Two times a day (BID) | ORAL | 0 refills | Status: DC

## 2016-09-19 MED ORDER — GI COCKTAIL ~~LOC~~
ORAL | Status: AC
  Filled 2016-09-19: qty 30

## 2016-09-19 MED ORDER — TRAMADOL HCL 50 MG PO TABS
50.0000 mg | ORAL_TABLET | Freq: Four times a day (QID) | ORAL | 0 refills | Status: DC | PRN
Start: 2016-09-19 — End: 2016-09-26

## 2016-09-19 NOTE — Discharge Instructions (Signed)
Your strep test was negative. It will be cultured to obtain a more accurate diagnosis. If the test grows out strep A he will be contacted and treated with an antibiotic over the phone. Rest, drink plenty of fluids and stay well-hydrated viral syndromes generally take 7-10 days to run the course. Take the following medications to help with your symptoms. Sudafed PE 10 mg every 4 to 6 hours as needed for congestion Allegra or Zyrtec daily as needed for drainage and runny nose. For stronger antihistamine may take Chlor-Trimeton 2 to 4 mg every 4 to 6 hours, may cause drowsiness. Saline nasal spray used frequently.  Drink plenty of fluids and stay well-hydrated.

## 2016-09-19 NOTE — ED Triage Notes (Signed)
Patient presents to [facility] today with complaint of Sore Throat, Headache and Ear Pain, x 3 days. Denies Fever

## 2016-09-19 NOTE — ED Provider Notes (Signed)
CSN: [id number]     Arrival date & time 09/19/16  1453 History   First MD Initiated Contact with Patient 09/19/16 1709     Chief Complaint  Patient presents with  . Sore Throat   (Consider location/radiation/quality/duration/timing/severity/associated sxs/prior Treatment) 30 year old obese female states that she has been sick for 3 days complaining of stomachache, sore throat, headache, right earache. She said no fever. Her only medication is ibuprofen which has not helped any of her URI symptoms. She said 2 episodes of loose stools.      Past Medical History:  Diagnosis Date  . Foot pain   . Gastritis [id number]  . GERD (gastroesophageal reflux disease)   . Seasonal allergies    Past Surgical History:  Procedure Laterality Date  . APPENDECTOMY N/A 05/25/2015   Procedure: APPENDECTOMY;  Surgeon: Manus RuddMatthew Tsuei, MD;  [facility];  Service: General;  Laterality: N/A;  . BREAST BIOPSY Right 05/03/13  . FOOT SURGERY    . WRIST SURGERY     Family History  Problem Relation Age of Onset  . Hypertension Father   . Diabetes Father   . Liver cancer Paternal Grandfather   . Diabetes Maternal Grandmother   . Kidney failure Maternal Grandmother    Social History  Substance Use Topics  . Smoking status: Never Smoker  . Smokeless tobacco: Never Used  . Alcohol use Yes     Comment: occ   OB History    Gravida Para Term Preterm AB Living   2 1       1    SAB TAB Ectopic Multiple Live Births                  Obstetric Comments   1st Menstrual Cycle:  12 1st Pregnancy: 20     Review of Systems  Constitutional: Positive for activity change and appetite change. Negative for chills, fatigue and fever.  HENT: Positive for congestion, ear pain, postnasal drip, rhinorrhea and sore throat. Negative for facial swelling.   Eyes: Negative.   Respiratory: Negative.  Negative for shortness of breath.   Cardiovascular: Negative.  Negative for chest pain.  Gastrointestinal: Positive for  abdominal pain. Negative for vomiting.       "Stomachache"  Genitourinary: Negative for dysuria, frequency, pelvic pain and vaginal bleeding.  Musculoskeletal: Negative for neck pain and neck stiffness.  Skin: Negative for pallor and rash.  Neurological: Positive for headaches.  All other systems reviewed and are negative.   Allergies  Morphine and related  Home Medications   Prior to Admission medications   Medication Sig Start Date End Date Taking? Authorizing Provider  fexofenadine (ALLEGRA) 180 MG tablet Take 180 mg by mouth daily as needed for allergies or rhinitis.   Yes Historical Provider, MD  omeprazole (PRILOSEC) 20 MG capsule Take 20 mg by mouth daily.   Yes Historical Provider, MD  phentermine 37.5 MG capsule Take 37.5 mg by mouth daily.   Yes Historical Provider, MD  docusate sodium (COLACE) 100 MG capsule Take 1 tablet once or twice daily as needed for constipation while taking narcotic pain medicine 08/14/16   Loleta Roseory Forbach, MD  ibuprofen (ADVIL,MOTRIN) 600 MG tablet Take 1 tablet (600 mg total) by mouth every 6 (six) hours as needed. 08/11/16   Derwood KaplanAnkit Nanavati, MD  ketorolac (TORADOL) 10 MG tablet Take 1 tablet (10 mg total) by mouth every 8 (eight) hours. 01/28/16   Jenise V Bacon Menshew, PA-C  lidocaine (LIDODERM) 5 % Place 1 patch onto the  skin every 12 (twelve) hours. Remove & Discard patch within 12 hours or as directed by MD 08/14/16 08/14/17  Loleta Roseory Forbach, MD  naproxen (EC-NAPROSYN) 500 MG EC tablet Take 1 tablet (500 mg total) by mouth 2 (two) times daily with a meal. 09/19/16   Hayden Rasmussenavid Bertie Mcconathy, NP  ondansetron (ZOFRAN ODT) 4 MG disintegrating tablet Allow 1-2 tablets to dissolve in your mouth every 8 hours as needed for nausea/vomiting 08/14/16   Loleta Roseory Forbach, MD  oxyCODONE-acetaminophen (ROXICET) 5-325 MG tablet Take 1-2 tablets by mouth every 4 (four) hours as needed for severe pain. 08/14/16   Loleta Roseory Forbach, MD  traMADol (ULTRAM) 50 MG tablet Take 1 tablet (50 mg total)  by mouth every 6 (six) hours as needed. 09/19/16   Hayden Rasmussenavid Sahian Kerney, NP   Meds Ordered and Administered this Visit   Medications  gi cocktail (Maalox,Lidocaine,Donnatal) (30 mLs Oral Given 09/19/16 1734)    BP (!) 152/105 (BP Location: Left Arm)   Pulse 108   Temp 98.7 F (37.1 C) (Oral)   LMP 08/20/2016   SpO2 100%  No data found.   Physical Exam  Constitutional: She is oriented to person, place, and time. She appears well-developed and well-nourished. No distress.  HENT:  Head: Normocephalic and atraumatic.  Right Ear: External ear normal.  Left Ear: External ear normal.  Mouth/Throat: No oropharyngeal exudate.  Left TM is normal. Right TM mildly retracted. No erythema or apparent effusion. Oropharynx pink, clear PND no exudates.  Eyes: EOM are normal.  Neck: Normal range of motion. Neck supple.  Cardiovascular: Normal rate, regular rhythm and normal heart sounds.   Pulmonary/Chest: Effort normal. No respiratory distress. She has no wheezes.  Abdominal: Soft. She exhibits no mass. There is no tenderness. There is no rebound and no guarding.  Musculoskeletal: She exhibits no edema.  Lymphadenopathy:    She has no cervical adenopathy.  Neurological: She is alert and oriented to person, place, and time.  Skin: Skin is warm and dry.  Psychiatric:  Tearful, exaggerated expression of despair.  Nursing note and vitals reviewed.   Urgent Care Course   Clinical Course     Procedures (including critical care time)  Labs Review Labs Reviewed  POCT RAPID STREP A    Imaging Review No results found.   Visual Acuity Review  Right Eye Distance:   Left Eye Distance:   Bilateral Distance:    Right Eye Near:   Left Eye Near:    Bilateral Near:         MDM   1. Viral illness   2. Acute pharyngitis, unspecified etiology   3. Acute nasopharyngitis    Your strep test was negative. It will be cultured to obtain a more accurate diagnosis. If the test grows out strep A  he will be contacted and treated with an antibiotic over the phone. Rest, drink plenty of fluids and stay well-hydrated viral syndromes generally take 7-10 days to run the course. Take the following medications to help with your symptoms. Sudafed PE 10 mg every 4 to 6 hours as needed for congestion Allegra or Zyrtec daily as needed for drainage and runny nose. For stronger antihistamine may take Chlor-Trimeton 2 to 4 mg every 4 to 6 hours, may cause drowsiness. Saline nasal spray used frequently.  Drink plenty of fluids and stay well-hydrated.  Meds ordered this encounter  Medications  . gi cocktail (Maalox,Lidocaine,Donnatal)  . traMADol (ULTRAM) 50 MG tablet    Sig: Take 1 tablet (50 mg total)  by mouth every 6 (six) hours as needed.    Dispense:  15 tablet    Refill:  0    Order Specific Question:   Supervising Provider    Answer:   Charm Rings [id number]  . naproxen (EC-NAPROSYN) 500 MG EC tablet    Sig: Take 1 tablet (500 mg total) by mouth 2 (two) times daily with a meal.    Dispense:  14 tablet    Refill:  0    Order Specific Question:   Supervising Provider    Answer:   Micheline Chapman       Hayden Rasmussen, NP 09/19/16 1751

## 2016-09-20 LAB — CULTURE, GROUP A STREP (THRC)

## 2016-09-26 ENCOUNTER — Ambulatory Visit (HOSPITAL_COMMUNITY)
Admission: EM | Admit: 2016-09-26 | Discharge: 2016-09-26 | Disposition: A | Discharge status: Home / Self Care | Payer: Medicaid Other | Attending: Family Medicine | Admitting: Family Medicine

## 2016-09-26 ENCOUNTER — Encounter (HOSPITAL_COMMUNITY): Payer: Self-pay | Admitting: Emergency Medicine

## 2016-09-26 DIAGNOSIS — R109 Unspecified abdominal pain: Secondary | ICD-10-CM

## 2016-09-26 DIAGNOSIS — R51 Headache: Secondary | ICD-10-CM | POA: Diagnosis not present

## 2016-09-26 DIAGNOSIS — R519 Headache, unspecified: Secondary | ICD-10-CM

## 2016-09-26 LAB — POCT I-STAT, CHEM 8
BUN: 10 mg/dL (ref 6–20)
Calcium, Ion: 1.28 mmol/L (ref 1.15–1.40)
Chloride: 104 mmol/L (ref 101–111)
Creatinine, Ser: 0.8 mg/dL (ref 0.44–1.00)
Glucose, Bld: 84 mg/dL (ref 65–99)
HCT: 39 % (ref 36.0–46.0)
Hemoglobin: 13.3 g/dL (ref 12.0–15.0)
Potassium: 4 mmol/L (ref 3.5–5.1)
Sodium: 142 mmol/L (ref 135–145)
TCO2: 27 mmol/L (ref 0–100)

## 2016-09-26 LAB — POCT URINALYSIS DIP (DEVICE)
Glucose, UA: NEGATIVE mg/dL
Hgb urine dipstick: NEGATIVE
Ketones, ur: NEGATIVE mg/dL
Leukocytes, UA: NEGATIVE
Nitrite: NEGATIVE
Protein, ur: 30 mg/dL — AB
Specific Gravity, Urine: >=1.03 (ref 1.005–1.030)
Urobilinogen, UA: 0.2 mg/dL (ref 0.0–1.0)
pH: 5.5 (ref 5.0–8.0)

## 2016-09-26 LAB — POCT PREGNANCY, URINE: Preg Test, Ur: NEGATIVE

## 2016-09-26 MED ORDER — CYCLOBENZAPRINE HCL 10 MG PO TABS: 10.0000 mg | ORAL_TABLET | Freq: Two times a day (BID) | ORAL | 0 refills | Status: DC | PRN

## 2016-09-26 NOTE — ED Triage Notes (Signed)
Pt reports right side pain that started yesterday and a bad headache. PT denies dysuria or N/V. PT reports problems with pain on right side for 3-4 weeks

## 2016-09-26 NOTE — Discharge Instructions (Signed)
The urine is reassuring in that there is no sign of blood or infection. The fact that your pain is worse when you bend forward suggested some of this pain is coming from muscle strain.  The blood test shows good kidney function. The urine test shows no blood and no sign of infection.

## 2016-09-26 NOTE — ED Notes (Signed)
PT made aware she cannot drive or operate machinery while taking cyclobenzaprine for pain. PT acknowledges instructions

## 2016-09-26 NOTE — ED Provider Notes (Signed)
[facility]    CSN: [id number] Arrival date & time: 09/26/16  1948     History   Chief Complaint Chief Complaint  Patient presents with  . Flank Pain    HPI [name] is a 30 y.o. female.   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  She comes in tonight with 24 hours of right flank pain and headache. She has had flank pain intermittently over the last year. She denies dysuria, fever, nausea or vomiting.  When she was seen in the emergency room in the past. She had blood in her urine on microscopic exam. She was evaluated with ultrasound and CT of the kidneys which were negative. She is referred to urology but the appointment is still pending. She says the pain is worse when she bends forward.  Last medical. And it just a few days ago. Patient is gravida 1 para 1 and has a 10619-year-old son. She lives with her parents, her sister and her children, and her son.      Past Medical History:  Diagnosis Date  . Foot pain   . Gastritis [id number]  . GERD (gastroesophageal reflux disease)   . Seasonal allergies     Patient Active Problem List   Diagnosis Date Noted  . Acute perforated appendicitis 05/25/2015  . Abdominal pain, left upper quadrant 02/21/2014  . Splenomegaly 02/21/2014  . Generalized anxiety disorder 02/21/2014  . Hemorrhage of rectum and anus 02/21/2014  . Lump or mass in breast 05/01/2013    Past Surgical History:  Procedure Laterality Date  . APPENDECTOMY N/A 05/25/2015   Procedure: APPENDECTOMY;  Surgeon: Manus RuddMatthew Tsuei, MD;  [facility];  Service: General;  Laterality: N/A;  . BREAST BIOPSY Right 05/03/13  . FOOT SURGERY    . WRIST SURGERY      OB History    Gravida Para Term Preterm AB Living   2 1       1    SAB TAB Ectopic Multiple Live Births                  Obstetric Comments   1st Menstrual Cycle:  12 1st Pregnancy: 20       Home Medications    Prior to Admission medications   Medication  Sig Start Date End Date Taking? Authorizing Provider  fexofenadine (ALLEGRA) 180 MG tablet Take 180 mg by mouth daily as needed for allergies or rhinitis.   Yes Historical Provider, MD  omeprazole (PRILOSEC) 20 MG capsule Take 40 mg by mouth daily.    Yes Historical Provider, MD  phentermine 37.5 MG capsule Take 37.5 mg by mouth daily.   Yes Historical Provider, MD  cyclobenzaprine (FLEXERIL) 10 MG tablet Take 1 tablet (10 mg total) by mouth 2 (two) times daily as needed for muscle spasms. 09/26/16   Elvina SidleKurt Odaly Peri, MD  docusate sodium (COLACE) 100 MG capsule Take 1 tablet once or twice daily as needed for constipation while taking narcotic pain medicine 08/14/16   Loleta Roseory Forbach, MD  ibuprofen (ADVIL,MOTRIN) 600 MG tablet Take 1 tablet (600 mg total) by mouth every 6 (six) hours as needed. 08/11/16   Derwood KaplanAnkit Nanavati, MD    Family History Family History  Problem Relation Age of Onset  . Hypertension Father   . Diabetes Father   . Liver cancer Paternal Grandfather   . Diabetes Maternal Grandmother   . Kidney failure Maternal Grandmother     Social History Social History  Substance Use  Topics  . Smoking status: Never Smoker  . Smokeless tobacco: Never Used  . Alcohol use Yes     Comment: occ     Allergies   Morphine and related   Review of Systems Review of Systems  Constitutional: Negative.   HENT: Negative.   Gastrointestinal: Negative.   Genitourinary: Positive for flank pain.  Neurological: Positive for headaches.     Physical Exam Triage Vital Signs ED Triage Vitals  Enc Vitals Group     BP 09/26/16 2001 146/93     Pulse Rate 09/26/16 2001 85     Resp 09/26/16 2001 16     Temp 09/26/16 2001 98.7 F (37.1 C)     Temp Source 09/26/16 2001 Oral     SpO2 09/26/16 2001 99 %     Weight 09/26/16 2001 245 lb (111.1 kg)     Height 09/26/16 2001 5\' 6"  (1.676 m)     Head Circumference --      Peak Flow --      Pain Score 09/26/16 2004 7     Pain Loc --      Pain Edu?  --      Excl. in GC? --    No data found.   Updated Vital Signs BP 146/93   Pulse 85   Temp 98.7 F (37.1 C) (Oral)   Resp 16   Ht 5\' 6"  (1.676 m)   Wt 245 lb (111.1 kg)   LMP 09/19/2016   SpO2 99%   BMI 39.54 kg/m     Physical Exam  Constitutional: She is oriented to person, place, and time. She appears well-developed and well-nourished. No distress.  HENT:  Head: Normocephalic and atraumatic.  Right Ear: External ear normal.  Left Ear: External ear normal.  Mouth/Throat: Oropharynx is clear and moist.  Eyes: Conjunctivae and EOM are normal.  Neck: Normal range of motion. Neck supple.  Cardiovascular: Normal rate and regular rhythm.   Pulmonary/Chest: Effort normal.  Abdominal: Soft. Bowel sounds are normal. She exhibits no distension and no mass. There is no tenderness. There is no rebound and no guarding.  Musculoskeletal: Normal range of motion.  Straight-leg raising and hip range of motion produces no pain.  Lymphadenopathy:    She has no cervical adenopathy.  Neurological: She is alert and oriented to person, place, and time.  Skin: Skin is warm and dry. She is not diaphoretic.  Psychiatric:  Patient appears somewhat depressed.  Nursing note and vitals reviewed.    UC Treatments / Results  Labs (all labs ordered are listed, but only abnormal results are displayed) Labs Reviewed  POCT URINALYSIS DIP (DEVICE) - Abnormal; Notable for the following:       Result Value   Bilirubin Urine SMALL (*)    Protein, ur 30 (*)    All other components within normal limits  POCT PREGNANCY, URINE  POCT I-STAT, CHEM 8   Results for orders placed or performed during the hospital encounter of 09/26/16  POCT urinalysis dip (device)  Result Value Ref Range   Glucose, UA NEGATIVE NEGATIVE mg/dL   Bilirubin Urine SMALL (A) NEGATIVE   Ketones, ur NEGATIVE NEGATIVE mg/dL   Specific Gravity, Urine >=1.030 1.005 - 1.030   Hgb urine dipstick NEGATIVE NEGATIVE   pH 5.5 5.0 -  8.0   Protein, ur 30 (A) NEGATIVE mg/dL   Urobilinogen, UA 0.2 0.0 - 1.0 mg/dL   Nitrite NEGATIVE NEGATIVE   Leukocytes, UA NEGATIVE NEGATIVE  Pregnancy, urine POC  Result Value Ref Range   Preg Test, Ur NEGATIVE NEGATIVE  I-STAT, chem 8  Result Value Ref Range   Sodium 142 135 - 145 mmol/L   Potassium 4.0 3.5 - 5.1 mmol/L   Chloride 104 101 - 111 mmol/L   BUN 10 6 - 20 mg/dL   Creatinine, Ser 1.610.80 0.44 - 1.00 mg/dL   Glucose, Bld 84 65 - 99 mg/dL   Calcium, Ion 0.961.28 0.451.15 - 1.40 mmol/L   TCO2 27 0 - 100 mmol/L   Hemoglobin 13.3 12.0 - 15.0 g/dL   HCT 40.939.0 81.136.0 - 91.446.0 %    EKG  EKG Interpretation None       Radiology No results found.  Procedures Procedures (including critical care time)  Medications Ordered in UC Medications - No data to display   Initial Impression / Assessment and Plan / UC Course  I have reviewed the triage vital signs and the nursing notes.  Pertinent labs & imaging results that were available during my care of the patient were reviewed by me and considered in my medical decision making (see chart for details).  Clinical Course     Final Clinical Impressions(s) / UC Diagnoses   Final diagnoses:  Right flank pain  Headache behind the eyes    New Prescriptions New Prescriptions   CYCLOBENZAPRINE (FLEXERIL) 10 MG TABLET    Take 1 tablet (10 mg total) by mouth 2 (two) times daily as needed for muscle spasms.     Elvina SidleKurt Kennadi Albany, MD 09/26/16 2049

## 2016-10-04 ENCOUNTER — Encounter: Payer: Self-pay | Admitting: Urology

## 2016-10-04 ENCOUNTER — Ambulatory Visit (INDEPENDENT_AMBULATORY_CARE_PROVIDER_SITE_OTHER): Payer: Medicaid Other | Admitting: Urology

## 2016-10-04 VITALS — BP 143/85 | HR 88 | Ht 66.0 in | Wt 248.9 lb

## 2016-10-04 DIAGNOSIS — R3129 Other microscopic hematuria: Secondary | ICD-10-CM

## 2016-10-04 DIAGNOSIS — R109 Unspecified abdominal pain: Secondary | ICD-10-CM | POA: Diagnosis not present

## 2016-10-04 MED ORDER — DIAZEPAM 2 MG PO TABS: 2.0000 mg | ORAL_TABLET | Freq: Four times a day (QID) | ORAL | 0 refills | Status: DC | PRN

## 2016-10-04 NOTE — Progress Notes (Signed)
10/04/2016 4:21 PM   [name] Apr 25, 1986 [id number]  Referring provider: Imelda Pillow, NP [address] [place], [place] [address]  Chief Complaint  Patient presents with  . Hematuria    new patient referred by for Hematuria    HPI: Patient is a 30 -year-old Caucasian female who presents today as a referral from their PCP, Imelda Pillow, NP, for microscopic hematuria.  Patient was found to have microscopic hematuria on 08/11/2016 with 6-30 RBC's/hpf.    Patient doesn't have a prior history of microscopic hematuria.  She does not have a prior history of recurrent urinary tract infections, nephrolithiasis, trauma to the genitourinary tract or malignancies of the genitourinary tract.    She does not have a family medical history of nephrolithiasis, malignancies of the genitourinary tract or hematuria.     Today, she is having nocturia    She is having right flank pain.  The only thing that helps her pain is pain medications.  She has not noticed any thing that makes the pain worse.  Her UA today demonstrates 0-2 RBC's.    She is not experiencing any suprapubic pain, abdominal pain or flank pain.  She has had some nausea.  She denies any recent fevers, chills or vomiting.   She underwent a CT Renal stone study on 08/04/2016 which no acute process identified as explanation for pain.  No urinary stone disease or obstructive uropathy.  Nonspecific borderline splenomegaly.  She is not a smoker.   She is exposed to second hand smoke.  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    PMH: Past Medical History:  Diagnosis Date  . Anxiety   . Foot pain   . Gastritis 5784,6962  . GERD (gastroesophageal reflux disease)   . Heartburn anxiety  . Seasonal allergies     Surgical History: Past Surgical History:  Procedure Laterality Date  . APPENDECTOMY N/A 05/25/2015   Procedure: APPENDECTOMY;  Surgeon: Manus Rudd, MD;  [facility];  Service: General;  Laterality:  N/A;  . BREAST BIOPSY Right 05/03/13  . FOOT SURGERY    . WRIST SURGERY      Home Medications:    Medication List       Accurate as of 10/04/16  4:21 PM. Always use your most recent med list.          cyclobenzaprine 10 MG tablet Commonly known as:  FLEXERIL Take 1 tablet (10 mg total) by mouth 2 (two) times daily as needed for muscle spasms.   diazepam 2 MG tablet Commonly known as:  VALIUM Take 1 tablet (2 mg total) by mouth every 6 (six) hours as needed for anxiety.   docusate sodium 100 MG capsule Commonly known as:  COLACE Take 1 tablet once or twice daily as needed for constipation while taking narcotic pain medicine   fexofenadine 180 MG tablet Commonly known as:  ALLEGRA Take 180 mg by mouth daily as needed for allergies or rhinitis.   ibuprofen 600 MG tablet Commonly known as:  ADVIL,MOTRIN Take 1 tablet (600 mg total) by mouth every 6 (six) hours as needed.   omeprazole 20 MG capsule Commonly known as:  PRILOSEC Take 40 mg by mouth daily.   oxyCODONE 15 MG immediate release tablet Commonly known as:  ROXICODONE OxyCODONE HCl 15MG  Oral Tablet QTY: 0 tablet Days: 0 Refills: 0  Written: 03/05/16 Patient Instructions: Take one every 4-6 hours as needed  for pain   phentermine 37.5 MG capsule Take 37.5 mg  by mouth daily.   topiramate 25 MG tablet Commonly known as:  TOPAMAX Topamax 25MG  Oral Tablet QTY: 30 tablet Days: 60 Refills: 2  Written: 07/12/16 Patient Instructions: Take 1/2 tablet orally daily in the evening       Allergies:  Allergies  Allergen Reactions  . Morphine And Related Other (See Comments)    "whole body feels aggravated"    Family History: Family History  Problem Relation Age of Onset  . Hypertension Father   . Diabetes Father   . Liver cancer Paternal Grandfather   . Diabetes Maternal Grandmother   . Kidney failure Maternal Grandmother   . Heart failure Maternal Grandmother   . Stroke Maternal Grandmother   . Lung cancer  Maternal Grandfather   . Stroke Paternal Grandmother   . Prostate cancer Neg Hx   . Bladder Cancer Neg Hx     Social History:  reports that she has never smoked. She has never used smokeless tobacco. She reports that she drinks alcohol. She reports that she does not use drugs.  ROS: UROLOGY Frequent Urination?: No Hard to postpone urination?: No Burning/pain with urination?: No Get up at night to urinate?: Yes Leakage of urine?: No Urine stream starts and stops?: No Trouble starting stream?: No Do you have to strain to urinate?: No Blood in urine?: Yes Urinary tract infection?: No Sexually transmitted disease?: No Injury to kidneys or bladder?: No Painful intercourse?: No Weak stream?: No Currently pregnant?: No Vaginal bleeding?: No Last menstrual period?: n  Gastrointestinal Nausea?: Yes Vomiting?: No Indigestion/heartburn?: No Diarrhea?: Yes Constipation?: No  Constitutional Fever: No Night sweats?: No Weight loss?: No Fatigue?: Yes  Skin Skin rash/lesions?: No Itching?: No  Eyes Blurred vision?: No Double vision?: No  Ears/Nose/Throat Sore throat?: Yes Sinus problems?: No  Hematologic/Lymphatic Swollen glands?: No Easy bruising?: No  Cardiovascular Leg swelling?: No Chest pain?: No  Respiratory Cough?: Yes Shortness of breath?: No  Endocrine Excessive thirst?: Yes  Musculoskeletal Back pain?: Yes Joint pain?: No  Neurological Headaches?: Yes Dizziness?: No  Psychologic Depression?: No Anxiety?: Yes  Physical Exam: BP (!) 143/85   Pulse 88   Ht 5\' 6"  (1.676 m)   Wt 248 lb 14.4 oz (112.9 kg)   LMP 09/19/2016   BMI 40.17 kg/m   Constitutional: Well nourished. Alert and oriented, No acute distress. HEENT: [place] AT, moist mucus membranes. Trachea midline, no masses. Cardiovascular: No clubbing, cyanosis, or edema. Respiratory: Normal respiratory effort, no increased work of breathing. GI: Abdomen is soft, non tender, non  distended, no abdominal masses. Liver and spleen not palpable.  No hernias appreciated.  Stool sample for occult testing is not indicated.   GU: No CVA tenderness.  No bladder fullness or masses.   Skin: No rashes, bruises or suspicious lesions. Lymph: No cervical or inguinal adenopathy. Neurologic: Grossly intact, no focal deficits, moving all 4 extremities. Psychiatric: Normal mood and affect.  Laboratory Data: Lab Results  Component Value Date   WBC 8.7 08/13/2016   HGB 13.3 09/26/2016   HCT 39.0 09/26/2016   MCV 83.6 08/13/2016   PLT 226 08/13/2016    Lab Results  Component Value Date   CREATININE 0.80 09/26/2016    Lab Results  Component Value Date   AST 33 08/13/2016   Lab Results  Component Value Date   ALT 49 08/13/2016     Urinalysis Unremarkable.  See EPIC.    Pertinent Imaging: CLINICAL DATA:  30 y/o  F; 2 weeks of abdominal pain and  nausea.  EXAM: CT ABDOMEN AND PELVIS WITHOUT CONTRAST  TECHNIQUE: Multidetector CT imaging of the abdomen and pelvis was performed following the standard protocol without IV contrast.  COMPARISON:  06/13/2015 CT of abdomen and pelvis.  FINDINGS: Lower chest: No acute abnormality.  Hepatobiliary: No focal liver abnormality is seen. No gallstones, gallbladder wall thickening, or biliary dilatation.  Pancreas: Unremarkable. No pancreatic ductal dilatation or surrounding inflammatory changes.  Spleen: Borderline splenomegaly.  Adrenals/Urinary Tract: Adrenal glands are unremarkable. Kidneys are normal, without renal calculi, focal lesion, or hydronephrosis. Bladder is unremarkable. Pelvic phleboliths.  Stomach/Bowel: Stomach is within normal limits. Appendix resected. No evidence of bowel wall thickening, distention, or inflammatory changes.  Vascular/Lymphatic: No significant vascular findings are present. No enlarged abdominal or pelvic lymph nodes.  Reproductive: Uterus and bilateral adnexa are  unremarkable.  Other: Midline lower abdominal postsurgical changes in the anterior abdominal wall.  Musculoskeletal: Mild levocurvature of the lumbar spine with apex at L3. No acute osseous abnormality is evident  IMPRESSION: 1. No acute process identified as explanation for pain. 2. No urinary stone disease or obstructive uropathy. 3. Nonspecific borderline splenomegaly.   Electronically Signed   By: Mitzi HansenLance  Furusawa-Stratton M.D.   On: 08/14/2016 03:23   Assessment & Plan  1. Microscopic hematuria  - not seen on today's exam  - if persists would see a nephrologist  2. Right sided flank pain  - not urological as no stone or hydronephrosis seen on recent CT scan 08/2016  - most likely musculoskeletal - levocurvature  - apply heating pad to loosen the muscle, Valium 2 mg q 6 hours #10 per Dr. Jasmine AweHerrick's instructions - follow up with PCP or see a back specialist     Return if symptoms worsen or fail to improve.  These notes generated with voice recognition software. I apologize for typographical errors.  Michiel CowboySHANNON Korban Shearer, PA-C  [facility] [address], [address] [place], [place] [address] [phone]

## 2016-10-05 LAB — MICROSCOPIC EXAMINATION: Epithelial Cells (non renal): >10 /hpf — AB (ref 0–10)

## 2016-10-05 LAB — BUN+CREAT
BUN/Creatinine Ratio: 19 (ref 9–23)
BUN: 16 mg/dL (ref 6–20)
Creatinine, Ser: 0.83 mg/dL (ref 0.57–1.00)
GFR calc Af Amer: 109 mL/min/{1.73_m2} (ref >59)
GFR calc non Af Amer: 95 mL/min/{1.73_m2} (ref >59)

## 2016-10-05 LAB — URINALYSIS, COMPLETE
Bilirubin, UA: NEGATIVE
Glucose, UA: NEGATIVE
Ketones, UA: NEGATIVE
Leukocytes, UA: NEGATIVE
Nitrite, UA: NEGATIVE
RBC, UA: NEGATIVE
Specific Gravity, UA: >1.03 — ABNORMAL HIGH (ref 1.005–1.030)
Urobilinogen, Ur: 0.2 mg/dL (ref 0.2–1.0)
pH, UA: 5.5 (ref 5.0–7.5)

## 2016-10-08 LAB — CULTURE, URINE COMPREHENSIVE

## 2016-10-12 ENCOUNTER — Emergency Department
Admission: EM | Admit: 2016-10-12 | Discharge: 2016-10-12 | Disposition: A | Discharge status: Home / Self Care | Payer: Medicaid Other | Attending: Emergency Medicine | Admitting: Emergency Medicine

## 2016-10-12 ENCOUNTER — Encounter: Payer: Self-pay | Admitting: Emergency Medicine

## 2016-10-12 DIAGNOSIS — Y990 Civilian activity done for income or pay: Secondary | ICD-10-CM | POA: Insufficient documentation

## 2016-10-12 DIAGNOSIS — R519 Headache, unspecified: Secondary | ICD-10-CM

## 2016-10-12 DIAGNOSIS — M779 Enthesopathy, unspecified: Secondary | ICD-10-CM | POA: Diagnosis not present

## 2016-10-12 DIAGNOSIS — M549 Dorsalgia, unspecified: Secondary | ICD-10-CM | POA: Insufficient documentation

## 2016-10-12 DIAGNOSIS — Z791 Long term (current) use of non-steroidal anti-inflammatories (NSAID): Secondary | ICD-10-CM | POA: Diagnosis not present

## 2016-10-12 DIAGNOSIS — Y929 Unspecified place or not applicable: Secondary | ICD-10-CM | POA: Insufficient documentation

## 2016-10-12 DIAGNOSIS — Y9389 Activity, other specified: Secondary | ICD-10-CM | POA: Diagnosis not present

## 2016-10-12 DIAGNOSIS — R51 Headache: Secondary | ICD-10-CM | POA: Insufficient documentation

## 2016-10-12 DIAGNOSIS — X500XXA Overexertion from strenuous movement or load, initial encounter: Secondary | ICD-10-CM | POA: Diagnosis not present

## 2016-10-12 DIAGNOSIS — S6992XA Unspecified injury of left wrist, hand and finger(s), initial encounter: Secondary | ICD-10-CM | POA: Diagnosis present

## 2016-10-12 DIAGNOSIS — M778 Other enthesopathies, not elsewhere classified: Secondary | ICD-10-CM

## 2016-10-12 LAB — URINALYSIS, COMPLETE (UACMP) WITH MICROSCOPIC
Bacteria, UA: NONE SEEN
Bilirubin Urine: NEGATIVE
Glucose, UA: NEGATIVE mg/dL
Hgb urine dipstick: NEGATIVE
Ketones, ur: NEGATIVE mg/dL
Leukocytes, UA: NEGATIVE
Nitrite: NEGATIVE
Protein, ur: 30 mg/dL — AB
RBC / HPF: NONE SEEN RBC/hpf (ref 0–5)
Specific Gravity, Urine: 1.024 (ref 1.005–1.030)
pH: 5 (ref 5.0–8.0)

## 2016-10-12 MED ORDER — BACLOFEN 10 MG PO TABS: 10.0000 mg | ORAL_TABLET | Freq: Three times a day (TID) | ORAL | 0 refills | Status: DC

## 2016-10-12 MED ORDER — MELOXICAM 15 MG PO TABS: 15.0000 mg | ORAL_TABLET | Freq: Every day | ORAL | 0 refills | Status: DC

## 2016-10-12 NOTE — ED Triage Notes (Signed)
Pt to ed with c/o left hand pain and lower back pain x 2 weeks.  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 She has not notified her work of the injury.

## 2016-10-12 NOTE — ED Notes (Signed)
Splint applied to left hand.

## 2016-10-12 NOTE — ED Provider Notes (Signed)
[facility] Emergency Department Provider Note  ___________________________________________   First MD Initiated Contact with Patient 10/12/16 [phone]     (approximate)  I have reviewed the triage vital signs and the nursing notes.   HISTORY  Chief Complaint Hand Pain and Back Pain   HPI [name] is a 30 y.o. female who presents to the emergency department for evaluation of left hand pain, back pain, and headache.She's had no relief with Tylenol, ibuprofen, muscle relaxer, or tramadol. 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 Pain is on the right side and does not radiate.  She denies specific injury. She also complains of a generalized, diffuse headache.   Past Medical History:  Diagnosis Date  . Anxiety   . Foot pain   . Gastritis 9604,5409  . GERD (gastroesophageal reflux disease)   . Heartburn anxiety  . Seasonal allergies     Patient Active Problem List   Diagnosis Date Noted  . Acute perforated appendicitis 05/25/2015  . Abdominal pain, left upper quadrant 02/21/2014  . Splenomegaly 02/21/2014  . Generalized anxiety disorder 02/21/2014  . Hemorrhage of rectum and anus 02/21/2014  . Lump or mass in breast 05/01/2013    Past Surgical History:  Procedure Laterality Date  . APPENDECTOMY N/A 05/25/2015   Procedure: APPENDECTOMY;  Surgeon: Manus Rudd, MD;  Location: [facility] OR;  Service: General;  Laterality: N/A;  . BREAST BIOPSY Right 05/03/13  . FOOT SURGERY    . WRIST SURGERY      Prior to Admission medications   Medication Sig Start Date End Date Taking? Authorizing Provider  baclofen (LIORESAL) 10 MG tablet Take 1 tablet (10 mg total) by mouth 3 (three) times daily. 10/12/16   Chinita Pester, FNP  cyclobenzaprine (FLEXERIL) 10 MG tablet Take 1 tablet (10 mg total) by mouth 2 (two) times daily as needed for muscle spasms. Patient not taking: Reported on 10/04/2016 09/26/16   Elvina Sidle, MD    diazepam (VALIUM) 2 MG tablet Take 1 tablet (2 mg total) by mouth every 6 (six) hours as needed for anxiety. 10/04/16   Harle Battiest, PA-C  docusate sodium (COLACE) 100 MG capsule Take 1 tablet once or twice daily as needed for constipation while taking narcotic pain medicine Patient not taking: Reported on 10/04/2016 08/14/16   Loleta Rose, MD  fexofenadine (ALLEGRA) 180 MG tablet Take 180 mg by mouth daily as needed for allergies or rhinitis.    Historical Provider, MD  ibuprofen (ADVIL,MOTRIN) 600 MG tablet Take 1 tablet (600 mg total) by mouth every 6 (six) hours as needed. 08/11/16   Derwood Kaplan, MD  meloxicam (MOBIC) 15 MG tablet Take 1 tablet (15 mg total) by mouth daily. 10/12/16   Chinita Pester, FNP  omeprazole (PRILOSEC) 20 MG capsule Take 40 mg by mouth daily.     Historical Provider, MD  oxyCODONE (ROXICODONE) 15 MG immediate release tablet OxyCODONE HCl 15MG  Oral Tablet QTY: 0 tablet Days: 0 Refills: 0  Written: 03/05/16 Patient Instructions: Take one every 4-6 hours as needed  for pain 03/05/16   Historical Provider, MD  phentermine 37.5 MG capsule Take 37.5 mg by mouth daily.    Historical Provider, MD  topiramate (TOPAMAX) 25 MG tablet Topamax 25MG  Oral Tablet QTY: 30 tablet Days: 60 Refills: 2  Written: 07/12/16 Patient Instructions: Take 1/2 tablet orally daily in the evening 07/12/16 01/08/17  Historical Provider, MD    Allergies Morphine and related  Family History  Problem Relation Age of Onset  . Hypertension Father   . Diabetes Father   . Liver cancer Paternal Grandfather   . Diabetes Maternal Grandmother   . Kidney failure Maternal Grandmother   . Heart failure Maternal Grandmother   . Stroke Maternal Grandmother   . Lung cancer Maternal Grandfather   . Stroke Paternal Grandmother   . Prostate cancer Neg Hx   . Bladder Cancer Neg Hx     Social History Social History  Substance Use Topics  . Smoking status: Never Smoker  . Smokeless tobacco: Never Used   . Alcohol use Yes     Comment: occ    Review of Systems Constitutional: No fever/chills Eyes: No visual changes. No blurred vision. ENT: No sore throat. Cardiovascular: Denies chest pain. Respiratory: Denies shortness of breath. Gastrointestinal: No abdominal pain.  No nausea, no vomiting.  No diarrhea.  No constipation. Genitourinary: Negative for dysuria. Musculoskeletal: Positive for back pain. Skin: Negative for rash.  Neurological: Positive for headaches, negative for focal weakness or numbness. ____________________________________________   PHYSICAL EXAM:  VITAL SIGNS: ED Triage Vitals [10/12/16 0803]  Enc Vitals Group     BP (!) 138/97     Pulse Rate 97     Resp 20     Temp 98.2 F (36.8 C)     Temp Source Oral     SpO2 100 %     Weight 249 lb (112.9 kg)     Height 5\' 6"  (1.676 m)     Head Circumference      Peak Flow      Pain Score 8     Pain Loc      Pain Edu?      Excl. in GC?     Constitutional: Alert and oriented. Well appearing and in no acute distress. Eyes: Conjunctivae are normal. PERRL. EOMI. Head: Atraumatic. Nose: No congestion/rhinnorhea. Mouth/Throat: Mucous membranes are moist.  Oropharynx non-erythematous. Neck: No stridor.   Cardiovascular: Normal rate, regular rhythm. Grossly normal heart sounds.  Good peripheral circulation. Respiratory: Normal respiratory effort.  No retractions. Musculoskeletal: No lower extremity tenderness nor edema.  No joint effusions. Straight leg raise is negative. Neurologic:  Normal speech and language. No gross focal neurologic deficits are appreciated. No gait instability. Skin:  Skin is warm, dry and intact. No rash noted. Psychiatric: Mood and affect are normal. Speech and behavior are normal.  ____________________________________________   LABS (all labs ordered are listed, but only abnormal results are displayed)  Labs Reviewed  URINALYSIS, COMPLETE (UACMP) WITH MICROSCOPIC - Abnormal; Notable for  the following:       Result Value   Color, Urine YELLOW (*)    APPearance CLEAR (*)    Protein, ur 30 (*)    Squamous Epithelial / LPF 0-5 (*)    All other components within normal limits   ____________________________________________  EKG   ____________________________________________  RADIOLOGY  Not indicated ____________________________________________   PROCEDURES  Procedure(s) performed: None  Procedures  Critical Care performed: No  ____________________________________________   INITIAL IMPRESSION / ASSESSMENT AND PLAN / ED COURSE  Pertinent labs & imaging results that were available during my care of the patient were reviewed by me and considered in my medical decision making (see chart for details).  30 year old female with multiple musculoskeletal related complaints and generalized, diffuse headache. She will be treated with baclofen and meloxicam and will be discharged home. She was advised that the headache may be a result of phentermine and  was advised to discuss this with the prescriber. She was advised to continue taking the Tylenol in addition to the meloxicam and baclofen. She was advised to return to the emergency department for symptoms that change or worsen if she is unable to schedule an appointment.  Clinical Course      ____________________________________________   FINAL CLINICAL IMPRESSION(S) / ED DIAGNOSES  Final diagnoses:  Musculoskeletal back pain  Left hand tendonitis  Acute nonintractable headache, unspecified headache type      NEW MEDICATIONS STARTED DURING THIS VISIT:  New Prescriptions   BACLOFEN (LIORESAL) 10 MG TABLET    Take 1 tablet (10 mg total) by mouth 3 (three) times daily.   MELOXICAM (MOBIC) 15 MG TABLET    Take 1 tablet (15 mg total) by mouth daily.     Note:  This document was prepared using Dragon voice recognition software and may include unintentional dictation errors.    Chinita PesterCari B Zetha Kuhar, FNP 10/12/16  [phone]    Arnaldo NatalPaul F Malinda, MD 10/12/16 [phone]

## 2016-10-12 NOTE — Discharge Instructions (Signed)
Follow-up with your primary care provider to discuss discontinuing the phentermine. You may continue to take Tylenol in addition to the meloxicam and baclofen that was prescribed today. You should return to the emergency department for symptoms that change or worsen if you are unable to schedule an appointment with her primary care provider.

## 2016-10-12 NOTE — ED Notes (Signed)
Provider to bedside to discuss patient concerns

## 2017-02-06 ENCOUNTER — Emergency Department (HOSPITAL_COMMUNITY)
Admission: EM | Admit: 2017-02-06 | Discharge: 2017-02-06 | Disposition: A | Discharge status: Home / Self Care | Payer: Medicaid Other | Attending: Emergency Medicine | Admitting: Emergency Medicine

## 2017-02-06 ENCOUNTER — Emergency Department (HOSPITAL_COMMUNITY): Payer: Medicaid Other

## 2017-02-06 ENCOUNTER — Encounter: Payer: Self-pay | Admitting: Emergency Medicine

## 2017-02-06 DIAGNOSIS — K529 Noninfective gastroenteritis and colitis, unspecified: Secondary | ICD-10-CM | POA: Diagnosis not present

## 2017-02-06 DIAGNOSIS — Z79899 Other long term (current) drug therapy: Secondary | ICD-10-CM | POA: Insufficient documentation

## 2017-02-06 DIAGNOSIS — R1110 Vomiting, unspecified: Secondary | ICD-10-CM | POA: Diagnosis present

## 2017-02-06 LAB — URINALYSIS, ROUTINE W REFLEX MICROSCOPIC
Bilirubin Urine: NEGATIVE
Glucose, UA: NEGATIVE mg/dL
Hgb urine dipstick: NEGATIVE
Ketones, ur: NEGATIVE mg/dL
Leukocytes, UA: NEGATIVE
Nitrite: NEGATIVE
Protein, ur: NEGATIVE mg/dL
Specific Gravity, Urine: 1.024 (ref 1.005–1.030)
pH: 5 (ref 5.0–8.0)

## 2017-02-06 LAB — CBC
HCT: 38.1 % (ref 36.0–46.0)
Hemoglobin: 12.4 g/dL (ref 12.0–15.0)
MCH: 26.3 pg (ref 26.0–34.0)
MCHC: 32.5 g/dL (ref 30.0–36.0)
MCV: 80.9 fL (ref 78.0–100.0)
Platelets: 202 10*3/uL (ref 150–400)
RBC: 4.71 MIL/uL (ref 3.87–5.11)
RDW: 13.5 % (ref 11.5–15.5)
WBC: 6.9 10*3/uL (ref 4.0–10.5)

## 2017-02-06 LAB — COMPREHENSIVE METABOLIC PANEL
ALT: 45 U/L (ref 14–54)
AST: 31 U/L (ref 15–41)
Albumin: 4.3 g/dL (ref 3.5–5.0)
Alkaline Phosphatase: 64 U/L (ref 38–126)
Anion gap: 5 (ref 5–15)
BUN: 10 mg/dL (ref 6–20)
CO2: 26 mmol/L (ref 22–32)
Calcium: 9 mg/dL (ref 8.9–10.3)
Chloride: 106 mmol/L (ref 101–111)
Creatinine, Ser: 0.75 mg/dL (ref 0.44–1.00)
GFR calc Af Amer: >60 mL/min (ref >60)
GFR calc non Af Amer: >60 mL/min (ref >60)
Glucose, Bld: 109 mg/dL — ABNORMAL HIGH (ref 65–99)
Potassium: 3.4 mmol/L — ABNORMAL LOW (ref 3.5–5.1)
Sodium: 137 mmol/L (ref 135–145)
Total Bilirubin: 0.8 mg/dL (ref 0.3–1.2)
Total Protein: 7.6 g/dL (ref 6.5–8.1)

## 2017-02-06 LAB — I-STAT BETA HCG BLOOD, ED (MC, WL, AP ONLY): I-stat hCG, quantitative: <5 m[IU]/mL (ref <5)

## 2017-02-06 LAB — LIPASE, BLOOD: Lipase: 19 U/L (ref 11–51)

## 2017-02-06 MED ORDER — ONDANSETRON HCL 4 MG/2ML IJ SOLN
4.0000 mg | Freq: Once | INTRAMUSCULAR | Status: AC | PRN
  Administered 2017-02-06: 4 mg via INTRAVENOUS
  Filled 2017-02-06: qty 2

## 2017-02-06 MED ORDER — IOPAMIDOL (ISOVUE-300) INJECTION 61%
INTRAVENOUS | Status: AC
  Administered 2017-02-06: 100 mL via INTRAVENOUS
  Filled 2017-02-06: qty 100

## 2017-02-06 MED ORDER — ACETAMINOPHEN 325 MG PO TABS
650.0000 mg | ORAL_TABLET | Freq: Once | ORAL | Status: AC
  Administered 2017-02-06: 650 mg via ORAL
  Filled 2017-02-06: qty 2

## 2017-02-06 MED ORDER — KETOROLAC TROMETHAMINE 15 MG/ML IJ SOLN
15.0000 mg | Freq: Once | INTRAMUSCULAR | Status: AC
Start: 2017-02-06 — End: 2017-02-06
  Administered 2017-02-06: 15 mg via INTRAVENOUS
  Filled 2017-02-06: qty 1

## 2017-02-06 MED ORDER — POTASSIUM CHLORIDE CRYS ER 20 MEQ PO TBCR
40.0000 meq | EXTENDED_RELEASE_TABLET | Freq: Once | ORAL | Status: AC
  Administered 2017-02-06: 40 meq via ORAL
  Filled 2017-02-06: qty 2

## 2017-02-06 MED ORDER — PROMETHAZINE HCL 25 MG/ML IJ SOLN
25.0000 mg | Freq: Once | INTRAMUSCULAR | Status: AC
  Administered 2017-02-06: 25 mg via INTRAVENOUS
  Filled 2017-02-06: qty 1

## 2017-02-06 MED ORDER — SODIUM CHLORIDE 0.9 % IV BOLUS (SEPSIS)
1000.0000 mL | Freq: Once | INTRAVENOUS | Status: AC
  Administered 2017-02-06: 1000 mL via INTRAVENOUS

## 2017-02-06 MED ORDER — ACETAMINOPHEN 500 MG PO TABS
1000.0000 mg | ORAL_TABLET | Freq: Once | ORAL | Status: AC
  Administered 2017-02-06: 1000 mg via ORAL
  Filled 2017-02-06: qty 2

## 2017-02-06 MED ORDER — FENTANYL CITRATE (PF) 100 MCG/2ML IJ SOLN
50.0000 ug | Freq: Once | INTRAMUSCULAR | Status: AC
  Administered 2017-02-06: 50 ug via INTRAVENOUS
  Filled 2017-02-06: qty 2

## 2017-02-06 NOTE — Discharge Instructions (Signed)
Take Tylenol every 4 hours for aches for temperature higher than 100.4 while awake. Use your Phenergan or Zofran for nausea. Avoid milk or foods containing milk such as cheese or ice cream while having diarrhea.Make sure that you drink at least six 8 ounce glasses of water or Gatorade each day in order to stay well-hydrated. See your primary care physician if not feeling better in 2 or 3 days. Return if you continue to vomit after taking them Zofran or Phenergan. Or if concern for any reason

## 2017-02-06 NOTE — ED Provider Notes (Signed)
[facility]-EMERGENCY DEPT Provider Note   CSN: [id number] Arrival date & time: 02/06/17  1531 .By signing my name below, I, Levon Hedger, attest that this documentation has been prepared under the direction and in the presence of non-physician practitioner, Eyvonne Mechanic, PA-C. Electronically Signed: Levon Hedger, Scribe. 02/06/2017. 5:03 PM.   History   Chief Complaint Chief Complaint  Patient presents with  . Emesis  . Diarrhea    HPI [name] is a 31 y.o. female who presents to the Emergency Department complaining of frequent,  vomiting onset last night. Per pt, she has been unable to keep anything down since 9:30 last PM. She notes associated nausea, diarrhea,  headache, left lateral abdominal pain, and back pain. No alleviating or modifying factors noted.  Pt took Phenergan this morning with no significant relief of symptoms. Pt expresses that she has been caring for her father who was recently diagnosed with Norovirus infection. She denies any hematuria, dysuria, or cough.   The history is provided by the patient. No language interpreter was used.    Past Medical History:  Diagnosis Date  . Anxiety   . Foot pain   . Gastritis 9562,1308  . GERD (gastroesophageal reflux disease)   . Heartburn anxiety  . Seasonal allergies     Patient Active Problem List   Diagnosis Date Noted  . Acute perforated appendicitis 05/25/2015  . Abdominal pain, left upper quadrant 02/21/2014  . Splenomegaly 02/21/2014  . Generalized anxiety disorder 02/21/2014  . Hemorrhage of rectum and anus 02/21/2014  . Lump or mass in breast 05/01/2013    Past Surgical History:  Procedure Laterality Date  . APPENDECTOMY N/A 05/25/2015   Procedure: APPENDECTOMY;  Surgeon: Manus Rudd, MD;  Location: [facility] OR;  Service: General;  Laterality: N/A;  . BREAST BIOPSY Right 05/03/13  . FOOT SURGERY    . WRIST SURGERY      OB History    Gravida Para Term Preterm AB Living   SAB TAB Ectopic  Multiple Live Births                  Obstetric Comments   1st Menstrual Cycle:  12 1st Pregnancy: 20       Home Medications    Prior to Admission medications   Medication Sig Start Date End Date Taking? Authorizing Provider  baclofen (LIORESAL) 10 MG tablet Take 1 tablet (10 mg total) by mouth 3 (three) times daily. 10/12/16  Yes Cari B Triplett, FNP  fexofenadine (ALLEGRA) 180 MG tablet Take 180 mg by mouth daily as needed for allergies or rhinitis.   Yes Historical Provider, MD  ibuprofen (ADVIL,MOTRIN) 600 MG tablet Take 1 tablet (600 mg total) by mouth every 6 (six) hours as needed. Patient taking differently: Take 800 mg by mouth every 6 (six) hours as needed.  08/11/16  Yes Derwood Kaplan, MD  omeprazole (PRILOSEC) 20 MG capsule Take 40 mg by mouth daily.    Yes Historical Provider, MD  oxyCODONE (ROXICODONE) 15 MG immediate release tablet Take  every 4-6 hours as needed  for pain 03/05/16  Yes Historical Provider, MD  promethazine (PHENERGAN) 25 MG tablet Take 25 mg by mouth every 6 (six) hours as needed for nausea or vomiting.   Yes Historical Provider, MD  cyclobenzaprine (FLEXERIL) 10 MG tablet Take 1 tablet (10 mg total) by mouth 2 (two) times daily as needed for muscle spasms. Patient not taking: Reported on 10/04/2016  09/26/16   Elvina Sidle, MD  diazepam (VALIUM) 2 MG tablet Take 1 tablet (2 mg total) by mouth every 6 (six) hours as needed for anxiety. Patient not taking: Reported on 02/06/2017 10/04/16   Harle Battiest, PA-C  docusate sodium (COLACE) 100 MG capsule Take 1 tablet once or twice daily as needed for constipation while taking narcotic pain medicine Patient not taking: Reported on 10/04/2016 08/14/16   Loleta Rose, MD  meloxicam (MOBIC) 15 MG tablet Take 1 tablet (15 mg total) by mouth daily. Patient not taking: Reported on 02/06/2017 10/12/16   Chinita Pester, FNP    Family History Family History  Problem Relation Age of Onset  . Hypertension Father     . Diabetes Father   . Liver cancer Paternal Grandfather   . Diabetes Maternal Grandmother   . Kidney failure Maternal Grandmother   . Heart failure Maternal Grandmother   . Stroke Maternal Grandmother   . Lung cancer Maternal Grandfather   . Stroke Paternal Grandmother   . Prostate cancer Neg Hx   . Bladder Cancer Neg Hx     Social History Social History  Substance Use Topics  . Smoking status: Never Smoker  . Smokeless tobacco: Never Used  . Alcohol use Yes     Comment: occ     Allergies   Morphine and related   Review of Systems Review of Systems 10 systems reviewed and are negative for acute change except as noted in the HPI.  Physical Exam Updated Vital Signs BP (!) 138/95 (BP Location: Right Arm)   Pulse (!) 117   Temp 100 F (37.8 C) (Oral)   Resp 19   Ht  (1.676 m)   Wt 115.2 kg   LMP 01/30/2017   SpO2 98%   BMI 41.00 kg/m   Physical Exam  Constitutional: She is oriented to person, place, and time. She appears well-developed and well-nourished. No distress.  HENT:  Head: Normocephalic and atraumatic.  Mouth/Throat: Uvula is midline and oropharynx is clear and moist. No oropharyngeal exudate, posterior oropharyngeal edema, posterior oropharyngeal erythema or tonsillar abscesses.  Eyes: Conjunctivae are normal.  Cardiovascular: Normal rate.   Pulmonary/Chest: Effort normal.  Abdominal: Soft. She exhibits no distension. There is no tenderness.  No CVA tenderness  Neurological: She is alert and oriented to person, place, and time.  Skin: Skin is warm and dry.  Psychiatric: She has a normal mood and affect.  Nursing note and vitals reviewed.  ED Treatments / Results  DIAGNOSTIC STUDIES:  Oxygen Saturation is 98% on RA, normal by my interpretation.    COORDINATION OF CARE:  4:57 PM Will order Toradol, Tylenol, and Phenergan. Discussed treatment plan with pt at bedside and pt agreed to plan.   Labs (all labs ordered are listed, but only  abnormal results are displayed) Labs Reviewed  COMPREHENSIVE METABOLIC PANEL - Abnormal; Notable for the following:       Result Value   Potassium 3.4 (*)    Glucose, Bld 109 (*)    All other components within normal limits  LIPASE, BLOOD  CBC  URINALYSIS, ROUTINE W REFLEX MICROSCOPIC  I-STAT BETA HCG BLOOD, ED ([facility], [facility], [facility] ONLY)    EKG  EKG Interpretation None       Radiology Ct Abdomen Pelvis W Contrast  Result Date: 02/06/2017 CLINICAL DATA:  Left-sided abdominal pain and severe vomiting since yesterday. Diarrhea. EXAM: CT ABDOMEN AND PELVIS WITH CONTRAST TECHNIQUE: Multidetector CT imaging of the abdomen and pelvis  was performed using the standard protocol following bolus administration of intravenous contrast. CONTRAST:  100 mL ISOVUE-300 IOPAMIDOL (ISOVUE-300) INJECTION 61% COMPARISON:  08/14/2016 FINDINGS: Lower Chest: No acute findings. Hepatobiliary: No masses identified. Mild hepatic steatosis. Gallbladder is unremarkable. Pancreas:  No mass or inflammatory changes. Spleen: Within normal limits in size and appearance. Adrenals/Urinary Tract: No masses identified. No evidence of hydronephrosis. Stomach/Bowel: No evidence of obstruction, inflammatory process or abnormal fluid collections. Vascular/Lymphatic: No pathologically enlarged lymph nodes. No abdominal aortic aneurysm. Reproductive:  No mass or other significant abnormality. Other:  None. Musculoskeletal:  No suspicious bone lesions identified. IMPRESSION: No acute findings within the abdomen or pelvis. Mild hepatic steatosis. Electronically Signed   By: Myles Rosenthal M.D.   On: 02/06/2017 19:38    Procedures Procedures (including critical care time)  Medications Ordered in ED Medications  ondansetron (ZOFRAN) injection 4 mg (4 mg Intravenous Given 02/06/17 1616)  promethazine (PHENERGAN) injection 25 mg (25 mg Intravenous Given 02/06/17 1727)  acetaminophen (TYLENOL) tablet 650 mg (650 mg Oral Given 02/06/17 1704)  ketorolac  (TORADOL) 15 MG/ML injection 15 mg (15 mg Intravenous Given 02/06/17 1704)  sodium chloride 0.9 % bolus 1,000 mL (0 mLs Intravenous Stopped 02/06/17 1924)  sodium chloride 0.9 % bolus 1,000 mL (1,000 mLs Intravenous New Bag/Given 02/06/17 2020)  iopamidol (ISOVUE-300) 61 % injection (100 mLs Intravenous Contrast Given 02/06/17 1911)  fentaNYL (SUBLIMAZE) injection 50 mcg (50 mcg Intravenous Given 02/06/17 2021)  sodium chloride 0.9 % bolus 1,000 mL (1,000 mLs Intravenous New Bag/Given 02/06/17 2021)     Initial Impression / Assessment and Plan / ED Course  I have reviewed the triage vital signs and the nursing notes.  Pertinent labs & imaging results that were available during my care of the patient were reviewed by me and considered in my medical decision making (see chart for details).      Final Clinical Impressions(s) / ED Diagnoses   Final diagnoses:  Gastroenteritis   31 year old female presents today with nausea vomiting and diarrhea.  Patient also complaining of body aches.  She is tachycardic with a fever here.  She has no elevation in white count, no significant derangement and electrolytes, no elevation in lactic acid.  She has close family member with normal virus, likely experiencing similar infection.  Patient was given 2 L of fluid Toradol, and Tylenol.  She continues to endorse generalized body aches and headache, and remained tachycardic.  Patient did not have any significant abdominal tenderness to palpation, but was reporting abdominal discomfort, this is nonfocal.  CT scan will be ordered as patient continues to be tachycardic here in the ED. CT scan shows no acute abnormalities.  Patient will receive another liter of fluid.  She will be signed out to attending physician pending reassessment and fluid administration. New Prescriptions New Prescriptions   No medications on file   I personally performed the services described in this documentation, which was scribed in my presence.  The recorded information has been reviewed and is accurate.   Eyvonne Mechanic, PA-C 02/06/17 2028    Doug Sou, MD 02/07/17 [phone]

## 2017-02-06 NOTE — ED Triage Notes (Signed)
Pt BIB [facility] from home. Pt states that she believes that she has the norovirus. Her father recently had it and she has been caring for him. She is complaining of N/V/D for 14 hrs. She denies blood in her stool or vomit. She reports taking phenergan this morning around 1am, but only experienced a short period of relief. She was last able to keep anything down at 930p last night. A&Ox4. Ambulatory.

## 2017-02-06 NOTE — ED Notes (Signed)
Pt ambulated to POV. D/C information reviewed and she verbalized instructions and home care regimen.

## 2017-02-06 NOTE — ED Provider Notes (Signed)
Patient with diffuse abdominal pain and headache for the past 2-3 days. Other associated symptoms include vomiting and diarrhea she reports vomiting 12 times today and 2 episodes of diarrhea. On exam she is in no distress H ENT exam no facial asymmetry neck supple no signs of meningitis lungs clear auscultation heart regular rate and rhythm mildly tachycardic abdomen obese normal active bowel sounds nontender. Extremities edema. Neurologic Glasgow Coma Score 15 gait normal. Cranial nerves II through XII grossly intact. She is able to drink without difficulty after treatment wit with antiemetics and intravenous fluids. All potassium replenished in the emergency department. Plan follow-up with PMD if not feeling better in 2 or 3 days. Avoid dairy Encourage oral hydration. Tylenol for aches. She reports that she has Phenergan and Zofran at home   Doug Sou, MD 02/06/17 2117

## 2017-02-06 NOTE — ED Notes (Signed)
Bed: QI34 Expected date:  Expected time:  Means of arrival:  Comments: EMS- N/V/D

## 2017-02-10 ENCOUNTER — Emergency Department: Payer: Medicaid Other

## 2017-02-10 ENCOUNTER — Emergency Department
Admission: EM | Admit: 2017-02-10 | Discharge: 2017-02-10 | Disposition: A | Discharge status: Home / Self Care | Payer: Medicaid Other | Attending: Emergency Medicine | Admitting: Emergency Medicine

## 2017-02-10 ENCOUNTER — Encounter: Payer: Self-pay | Admitting: Emergency Medicine

## 2017-02-10 DIAGNOSIS — R1012 Left upper quadrant pain: Secondary | ICD-10-CM | POA: Insufficient documentation

## 2017-02-10 DIAGNOSIS — R112 Nausea with vomiting, unspecified: Secondary | ICD-10-CM

## 2017-02-10 DIAGNOSIS — E876 Hypokalemia: Secondary | ICD-10-CM | POA: Insufficient documentation

## 2017-02-10 DIAGNOSIS — Z79899 Other long term (current) drug therapy: Secondary | ICD-10-CM | POA: Diagnosis not present

## 2017-02-10 DIAGNOSIS — R197 Diarrhea, unspecified: Secondary | ICD-10-CM | POA: Diagnosis not present

## 2017-02-10 LAB — COMPREHENSIVE METABOLIC PANEL
ALT: 46 U/L (ref 14–54)
AST: 37 U/L (ref 15–41)
Albumin: 3.8 g/dL (ref 3.5–5.0)
Alkaline Phosphatase: 62 U/L (ref 38–126)
Anion gap: 7 (ref 5–15)
BUN: 12 mg/dL (ref 6–20)
CO2: 23 mmol/L (ref 22–32)
Calcium: 8.8 mg/dL — ABNORMAL LOW (ref 8.9–10.3)
Chloride: 107 mmol/L (ref 101–111)
Creatinine, Ser: 0.74 mg/dL (ref 0.44–1.00)
GFR calc Af Amer: >60 mL/min (ref >60)
GFR calc non Af Amer: >60 mL/min (ref >60)
Glucose, Bld: 112 mg/dL — ABNORMAL HIGH (ref 65–99)
Potassium: 3.3 mmol/L — ABNORMAL LOW (ref 3.5–5.1)
Sodium: 137 mmol/L (ref 135–145)
Total Bilirubin: 0.7 mg/dL (ref 0.3–1.2)
Total Protein: 7 g/dL (ref 6.5–8.1)

## 2017-02-10 LAB — URINALYSIS, COMPLETE (UACMP) WITH MICROSCOPIC
Bacteria, UA: NONE SEEN
Bilirubin Urine: NEGATIVE
Glucose, UA: NEGATIVE mg/dL
Hgb urine dipstick: NEGATIVE
Ketones, ur: NEGATIVE mg/dL
Leukocytes, UA: NEGATIVE
Nitrite: NEGATIVE
Protein, ur: 30 mg/dL — AB
Specific Gravity, Urine: 1.024 (ref 1.005–1.030)
pH: 5 (ref 5.0–8.0)

## 2017-02-10 LAB — CBC
HCT: 37.7 % (ref 35.0–47.0)
Hemoglobin: 12.9 g/dL (ref 12.0–16.0)
MCH: 26.8 pg (ref 26.0–34.0)
MCHC: 34.3 g/dL (ref 32.0–36.0)
MCV: 78.1 fL — ABNORMAL LOW (ref 80.0–100.0)
Platelets: 225 10*3/uL (ref 150–440)
RBC: 4.82 MIL/uL (ref 3.80–5.20)
RDW: 14.4 % (ref 11.5–14.5)
WBC: 8.1 10*3/uL (ref 3.6–11.0)

## 2017-02-10 LAB — LIPASE, BLOOD: Lipase: 28 U/L (ref 11–51)

## 2017-02-10 LAB — POCT PREGNANCY, URINE: Preg Test, Ur: NEGATIVE

## 2017-02-10 MED ORDER — IOPAMIDOL (ISOVUE-300) INJECTION 61%
100.0000 mL | Freq: Once | INTRAVENOUS | Status: AC | PRN
  Administered 2017-02-10: 100 mL via INTRAVENOUS

## 2017-02-10 MED ORDER — ACETAMINOPHEN 325 MG PO TABS
650.0000 mg | ORAL_TABLET | Freq: Once | ORAL | Status: AC
  Administered 2017-02-10: 650 mg via ORAL

## 2017-02-10 MED ORDER — ACETAMINOPHEN 325 MG PO TABS
ORAL_TABLET | ORAL | Status: AC
  Administered 2017-02-10: 650 mg via ORAL
  Filled 2017-02-10: qty 2

## 2017-02-10 MED ORDER — SODIUM CHLORIDE 0.9 % IV BOLUS (SEPSIS)
1000.0000 mL | Freq: Once | INTRAVENOUS | Status: AC
  Administered 2017-02-10: 1000 mL via INTRAVENOUS

## 2017-02-10 MED ORDER — ONDANSETRON HCL 4 MG/2ML IJ SOLN
INTRAMUSCULAR | Status: AC
  Administered 2017-02-10: 4 mg via INTRAVENOUS
  Filled 2017-02-10: qty 2

## 2017-02-10 MED ORDER — LOPERAMIDE HCL 2 MG PO TABS: 2.0000 mg | ORAL_TABLET | Freq: Four times a day (QID) | ORAL | 0 refills | Status: DC | PRN

## 2017-02-10 MED ORDER — IOPAMIDOL (ISOVUE-300) INJECTION 61%
30.0000 mL | Freq: Once | INTRAVENOUS | Status: AC | PRN
  Administered 2017-02-10: 30 mL via ORAL

## 2017-02-10 MED ORDER — ONDANSETRON 4 MG PO TBDP: 4.0000 mg | ORAL_TABLET | Freq: Three times a day (TID) | ORAL | 0 refills | Status: DC | PRN

## 2017-02-10 MED ORDER — FENTANYL CITRATE (PF) 100 MCG/2ML IJ SOLN
75.0000 ug | Freq: Once | INTRAMUSCULAR | Status: AC
  Administered 2017-02-10: 75 ug via INTRAVENOUS
  Filled 2017-02-10: qty 2

## 2017-02-10 MED ORDER — ONDANSETRON HCL 4 MG/2ML IJ SOLN
4.0000 mg | Freq: Once | INTRAMUSCULAR | Status: AC
  Administered 2017-02-10: 4 mg via INTRAVENOUS
  Filled 2017-02-10: qty 2

## 2017-02-10 MED ORDER — ONDANSETRON HCL 4 MG/2ML IJ SOLN
4.0000 mg | Freq: Once | INTRAMUSCULAR | Status: AC
  Administered 2017-02-10: 4 mg via INTRAVENOUS

## 2017-02-10 MED ORDER — POTASSIUM CHLORIDE CRYS ER 20 MEQ PO TBCR
40.0000 meq | EXTENDED_RELEASE_TABLET | Freq: Once | ORAL | Status: AC
  Administered 2017-02-10: 40 meq via ORAL
  Filled 2017-02-10: qty 2

## 2017-02-10 NOTE — ED Triage Notes (Signed)
Pt c/o of N/V/D since the 8th, seen at [facility], diagnosed with virus. Pt to ED today due to continued symptoms of the same as well as right sided abdominal pain.

## 2017-02-10 NOTE — ED Notes (Signed)
Pt called out, complains of still being nauseous and in pain; states she doesn't know if she will be able to drink oral contrast.  MD notified.  See new orders.

## 2017-02-10 NOTE — ED Provider Notes (Signed)
[facility] Emergency Department Provider Note  ____________________________________________  Time seen: Approximately 7:09 AM  I have reviewed the triage vital signs and the nursing notes.   HISTORY  Chief Complaint Emesis    HPI [name] is a 31 y.o. female w/ morbid obesity, splenomegaly, s/p appy presenting w/ n/v/d and LUQ pain.  Pt reports that last week, her father had normal virus and she developed nausea vomiting and diarrhea for several days. [facility] and [facility]. For 2 days, she was completely asymptomatic and was able to eat and drink normally. Last night, the patient developed loose, watery, nonbloody diarrhea and multiple episodes of nausea and vomiting associated with left upper quadrant pain. She denies any fever, chills, urinary symptoms. LMP 01/30/17.    Past Medical History:  Diagnosis Date  . Anxiety   . Foot pain   . Gastritis 1610,9604  . GERD (gastroesophageal reflux disease)   . Heartburn anxiety  . Seasonal allergies     Patient Active Problem List   Diagnosis Date Noted  . Acute perforated appendicitis 05/25/2015  . Abdominal pain, left upper quadrant 02/21/2014  . Splenomegaly 02/21/2014  . Generalized anxiety disorder 02/21/2014  . Hemorrhage of rectum and anus 02/21/2014  . Lump or mass in breast 05/01/2013    Past Surgical History:  Procedure Laterality Date  . APPENDECTOMY N/A 05/25/2015   Procedure: APPENDECTOMY;  Surgeon: Manus Rudd, MD;  Location: [facility] OR;  Service: General;  Laterality: N/A;  . BREAST BIOPSY Right 05/03/13  . FOOT SURGERY    . WRIST SURGERY      Current Outpatient Rx  . Order #: [id number] Class: Historical Med  . Order #: [id number] Class: Historical Med  . Order #: [id number] Class: Historical Med  . Order #: [id number] Class: Historical Med  . Order #: [id number] Class: Historical Med  . Order #: [id number] Class: Historical Med  . Order  #: [id number] Class: Print  . Order #: [id number] Class: Print    Allergies Morphine and related  Family History  Problem Relation Age of Onset  . Hypertension Father   . Diabetes Father   . Liver cancer Paternal Grandfather   . Diabetes Maternal Grandmother   . Kidney failure Maternal Grandmother   . Heart failure Maternal Grandmother   . Stroke Maternal Grandmother   . Lung cancer Maternal Grandfather   . Stroke Paternal Grandmother   . Prostate cancer Neg Hx   . Bladder Cancer Neg Hx     Social History Social History  Substance Use Topics  . Smoking status: Never Smoker  . Smokeless tobacco: Never Used  . Alcohol use Yes     Comment: occ    Review of Systems Constitutional: No fever/chills.No lightheadedness or syncope. No myalgias. Eyes: No visual changes. ENT: No sore throat. No congestion or rhinorrhea. Cardiovascular: Denies chest pain. Denies palpitations. Respiratory: Denies shortness of breath.  No cough. Gastrointestinal: Positive left upper quadrant abdominal pain.  Positive nausea, positive vomiting.  Positive diarrhea.  No constipation. Genitourinary: Negative for dysuria. LMP 04/01. Musculoskeletal: Negative for back pain. Skin: Negative for rash. Neurological: Negative for headaches. No focal numbness, tingling or weakness.   10-point ROS otherwise negative.  ____________________________________________   PHYSICAL EXAM:  VITAL SIGNS: ED Triage Vitals [02/10/17 0630]  Enc Vitals Group     BP 137/90     Pulse Rate 100     Resp 16     Temp 98 F (36.7 C)     Temp  Source Oral     SpO2 99 %     Weight 250 lb (113.4 kg)     Height  (1.676 m)     Head Circumference      Peak Flow      Pain Score      Pain Loc      Pain Edu?      Excl. in GC?     Constitutional: Alert and oriented. Well appearing and in no acute distress. Answers questions appropriately. Eyes: Conjunctivae are normal.  EOMI. No scleral icterus. Head: Atraumatic. Nose:  No congestion/rhinnorhea. Mouth/Throat: Mucous membranes are mildly dry.  Neck: No stridor.  Supple.  No meningismus. Cardiovascular: Normal rate, regular rhythm. No murmurs, rubs or gallops.  Respiratory: Normal respiratory effort.  No accessory muscle use or retractions. Lungs CTAB.  No wheezes, rales or ronchi. Gastrointestinal: Morbidly obese. + Reticular erythema over the L abdomen where heating pad is in place.  Soft, and nondistended.  TTP in the LUQ w/o palpable mass.  No Murphy's sign.  No guarding or rebound.  No peritoneal signs. Musculoskeletal: No LE edema. Neurologic:  A&Ox3.  Speech is clear.  Face and smile are symmetric.  EOMI.  Moves all extremities well. Skin:  Skin is warm, dry and intact. No rash noted. Psychiatric: Mood and affect are normal. Speech and behavior are normal.  Normal judgement.  ____________________________________________   LABS (all labs ordered are listed, but only abnormal results are displayed)  Labs Reviewed  COMPREHENSIVE METABOLIC PANEL - Abnormal; Notable for the following:       Result Value   Potassium 3.3 (*)    Glucose, Bld 112 (*)    Calcium 8.8 (*)    All other components within normal limits  CBC - Abnormal; Notable for the following:    MCV 78.1 (*)    All other components within normal limits  URINALYSIS, COMPLETE (UACMP) WITH MICROSCOPIC - Abnormal; Notable for the following:    Color, Urine YELLOW (*)    APPearance HAZY (*)    Protein, ur 30 (*)    Squamous Epithelial / LPF 0-5 (*)    All other components within normal limits  LIPASE, BLOOD  POC URINE PREG, ED  POCT PREGNANCY, URINE   ____________________________________________  EKG  Not indicated ____________________________________________  RADIOLOGY  Ct Abdomen Pelvis W Contrast  Result Date: 02/10/2017 CLINICAL DATA:  Right lower quadrant pain for 1 day. Left upper quadrant pain. EXAM: CT ABDOMEN AND PELVIS WITH CONTRAST TECHNIQUE: Multidetector CT imaging of  the abdomen and pelvis was performed using the standard protocol following bolus administration of intravenous contrast. CONTRAST:  ISOVUE-300 IOPAMIDOL (ISOVUE-300) INJECTION 61% COMPARISON:  02/06/2017 FINDINGS: Lower chest: Lung bases are clear. No effusions. Heart is normal size. Hepatobiliary: Mild diffuse fatty infiltration of the liver is similar to prior study. More focal pronounced fatty infiltration near the falciform ligament. No focal hepatic abnormality. Gallbladder unremarkable. Pancreas: No focal abnormality or ductal dilatation. Spleen: No focal abnormality. Spleen is borderline in size with a craniocaudal length of 13 cm, stable since prior study. Adrenals/Urinary Tract: No adrenal abnormality. No focal renal abnormality. No stones or hydronephrosis. Urinary bladder is unremarkable. Stomach/Bowel: Prior appendectomy. Stomach, large and small bowel grossly unremarkable. Vascular/Lymphatic: No evidence of aneurysm or adenopathy. Mildly prominent central mesenteric lymph nodes, stable since prior study and not pathologically enlarged. Reproductive: Uterus and adnexa unremarkable.  No mass. Other: No free fluid or free air. Musculoskeletal: No acute bony abnormality. IMPRESSION: Mild fatty  infiltration of the liver. Borderline splenomegaly with a craniocaudal length of 13 cm. This is stable since prior study. Electronically Signed   By: Charlett Nose M.D.   On: 02/10/2017 09:06    ____________________________________________   PROCEDURES  Procedure(s) performed: None  Procedures  Critical Care performed: No ____________________________________________   INITIAL IMPRESSION / ASSESSMENT AND PLAN / ED COURSE  Pertinent labs & imaging results that were available during my care of the patient were reviewed by me and considered in my medical decision making (see chart for details).  31 y.o. F w/ morbid obesity, prior appy, presenting w/ LUQ pain w/ n/v/d. Overall, the patient is  well-appearing, and has reassuring vital signs; she is afebrile here. It is possible she has a foodborne or viral GI illness, but given her previous presentation to an emergency department for similar symptoms as well as prior abdominal surgery and difficult examination due to morbid obesity, although CT scan for further evaluation. Symptomatic treatment has been ordered, and I'll reevaluate the patient for final disposition.  ----------------------------------------- 8:27 AM on 02/10/2017 -----------------------------------------  The patient states that she is not feeling any better after fentanyl, Tylenol, and 2 doses of Zofran. She is now asking for morphine, "even though I said I'm allergic to it."  I have asked her to wait to see if the medications we have already administered will help encourage her to continue drinking her by mouth contrast for her CT. Plan reevaluation.  ----------------------------------------- 9:40 AM on 02/10/2017 -----------------------------------------  Patient has been able to tolerate liquid, and has been able to fall sleep in the emergency department. Her CT scan does not show any acute process. I'll plan to discharge her home with symptomatic treatment. She will not be prescribed narcotics for her discomfort today. ____________________________________________  FINAL CLINICAL IMPRESSION(S) / ED DIAGNOSES  Final diagnoses:  Hypokalemia  Nausea vomiting and diarrhea  Left upper quadrant pain         NEW MEDICATIONS STARTED DURING THIS VISIT:  New Prescriptions   LOPERAMIDE (IMODIUM A-D) 2 MG TABLET    Take 1 tablet (2 mg total) by mouth 4 (four) times daily as needed for diarrhea or loose stools.   ONDANSETRON (ZOFRAN ODT) 4 MG DISINTEGRATING TABLET    Take 1 tablet (4 mg total) by mouth every 8 (eight) hours as needed for nausea or vomiting.      Rockne Menghini, MD 02/10/17 [id number]

## 2017-02-10 NOTE — ED Notes (Signed)
Pt states, I cannot drink this contrast with my stomach hurting like it is.  Additional meds given; see MAR.

## 2017-02-10 NOTE — Discharge Instructions (Signed)
Please take a clear liquid diet for the next 24-48 hours, then advance to a bland diet as tolerated.  You may take Zofran for nausea and loperamide for diarrhea. He may treat her pain with Tylenol or low doses of Motrin (  every 8 hours, with food).  Return to the emergency department if you develop severe pain, inability to keep down fluids, fever, or any other symptoms concerning to you.

## 2017-02-10 NOTE — ED Notes (Signed)
Patient transported to CT 

## 2017-02-21 ENCOUNTER — Ambulatory Visit (HOSPITAL_COMMUNITY)
Admission: EM | Admit: 2017-02-21 | Discharge: 2017-02-21 | Disposition: A | Discharge status: Home / Self Care | Payer: Medicaid Other | Attending: Emergency Medicine | Admitting: Emergency Medicine

## 2017-02-21 ENCOUNTER — Encounter (HOSPITAL_COMMUNITY): Payer: Self-pay | Admitting: Emergency Medicine

## 2017-02-21 DIAGNOSIS — M549 Dorsalgia, unspecified: Secondary | ICD-10-CM | POA: Diagnosis not present

## 2017-02-21 DIAGNOSIS — M5489 Other dorsalgia: Secondary | ICD-10-CM | POA: Diagnosis not present

## 2017-02-21 DIAGNOSIS — R197 Diarrhea, unspecified: Secondary | ICD-10-CM | POA: Diagnosis not present

## 2017-02-21 LAB — POCT URINALYSIS DIP (DEVICE)
Bilirubin Urine: NEGATIVE
Glucose, UA: NEGATIVE mg/dL
Hgb urine dipstick: NEGATIVE
Ketones, ur: NEGATIVE mg/dL
Leukocytes, UA: NEGATIVE
Nitrite: NEGATIVE
Protein, ur: NEGATIVE mg/dL
Specific Gravity, Urine: 1.015 (ref 1.005–1.030)
Urobilinogen, UA: 0.2 mg/dL (ref 0.0–1.0)
pH: 5.5 (ref 5.0–8.0)

## 2017-02-21 LAB — POCT H PYLORI SCREEN: H. PYLORI SCREEN, POC: NEGATIVE

## 2017-02-21 LAB — POCT I-STAT, CHEM 8
BUN: 7 mg/dL (ref 6–20)
Calcium, Ion: 1.26 mmol/L (ref 1.15–1.40)
Chloride: 104 mmol/L (ref 101–111)
Creatinine, Ser: 0.6 mg/dL (ref 0.44–1.00)
Glucose, Bld: 84 mg/dL (ref 65–99)
HCT: 35 % — ABNORMAL LOW (ref 36.0–46.0)
Hemoglobin: 11.9 g/dL — ABNORMAL LOW (ref 12.0–15.0)
Potassium: 3.5 mmol/L (ref 3.5–5.1)
Sodium: 138 mmol/L (ref 135–145)
TCO2: 25 mmol/L (ref 0–100)

## 2017-02-21 MED ORDER — DIPHENOXYLATE-ATROPINE 2.5-0.025 MG PO TABS: 1.0000 | ORAL_TABLET | Freq: Four times a day (QID) | ORAL | 0 refills | Status: DC | PRN

## 2017-02-21 MED ORDER — SIMETHICONE 80 MG PO CHEW: 80.0000 mg | CHEWABLE_TABLET | Freq: Four times a day (QID) | ORAL | 0 refills | Status: DC | PRN

## 2017-02-21 MED ORDER — IBUPROFEN 600 MG PO TABS: 600.0000 mg | ORAL_TABLET | Freq: Four times a day (QID) | ORAL | 0 refills | Status: DC | PRN

## 2017-02-21 MED ORDER — ACIDOPHILUS PROBIOTIC BLEND PO CAPS: 2.0000 | ORAL_CAPSULE | Freq: Two times a day (BID) | ORAL | 0 refills | Status: DC

## 2017-02-21 NOTE — ED Triage Notes (Signed)
The patient presented to the [facility] with a complaint of abdominal bloating and diarrhea that started today.

## 2017-02-21 NOTE — Discharge Instructions (Signed)
Lomotil, bland diet, avoid dairy, try the probiotics. Ibuprofen 600 mg with 1 g of Tylenol 4 times a day. Follow up with PMD in several days, to the ER if you get worse or for the signs and symptoms we discussed

## 2017-02-21 NOTE — ED Provider Notes (Signed)
HPI  SUBJECTIVE:  [name] is a 31 y.o. female who presents with sharp, constant, nonradiating and nonmigratory right sided back pain for the past 3 days. Occasionally has some sharp pains radiating down her lateral right leg.  She reports 3-5 episodes of watery, nonbloody diarrhea for the past 3 days. She states that she feels "bloated, gassy," states that she feels "hungry all the time" and reports reflux with belching starting today. She tried an appetite suppressant which did not help. She takes 40 mg of omeprazole daily for her GERD. She tried ibuprofen for her back pain and nothing for her diarrhea. There are no aggravating or alleviating factors. Her back pain is not associated with associated eating, drinking, fasting, urination, defecation, movement, torso rotation. She denies nausea, vomiting, anorexia. She does report some diffuse, sharp, occasional intermittent abdominal pain lasting seconds and then resolving. No other abdominal pain. She denies fevers, urinary complaints. No change in physical activity, lifting. No recent travel, antibiotics, camping, change in her diet, change in her medications. No sick contacts. No exposure chickens, reptiles, petting zoos. No chest pain, shortness of breath, saddle anesthesia. No surgery or trauma the past 4 weeks, hemoptysis. No exogenous estrogen. She is not a smoker. She has a past medical history of GERD for which she takes omeprazole, morbid obesity, C. difficile, she is status post appendectomy and small/large intestine resection, UTI. No history of H. pylori, DVT, PE, pyelonephritis, nephrolithiasis, gallbladder disease, pancreatitis. no history of diabetes, hypertension. LMP:  at the beginning of this month. She denies the possibility being pregnant. PMD: BOSWELL, CHELSA H, NP  Patient was seen in the ER on 4/8, found to have gastroenteritis., Then again on 4/12 for nausea, vomiting, diarrhea left upper quadrant pain. Had abdominal CT which  showed borderline splenomegaly, stable, mild fatty infiltration of the liver. She was found to be mildly hypokalemic, and discharged home with supportive treatment.     Past Medical History:  Diagnosis Date  . Anxiety   . Foot pain   . Gastritis [id number]  . GERD (gastroesophageal reflux disease)   . Heartburn anxiety  . Seasonal allergies     Past Surgical History:  Procedure Laterality Date  . APPENDECTOMY N/A 05/25/2015   Procedure: APPENDECTOMY;  Surgeon: Manus Rudd, MD;  Location: [facility] OR;  Service: General;  Laterality: N/A;  . BREAST BIOPSY Right 05/03/13  . FOOT SURGERY    . WRIST SURGERY      Family History  Problem Relation Age of Onset  . Hypertension Father   . Diabetes Father   . Liver cancer Paternal Grandfather   . Diabetes Maternal Grandmother   . Kidney failure Maternal Grandmother   . Heart failure Maternal Grandmother   . Stroke Maternal Grandmother   . Lung cancer Maternal Grandfather   . Stroke Paternal Grandmother   . Prostate cancer Neg Hx   . Bladder Cancer Neg Hx     Social History  Substance Use Topics  . Smoking status: Never Smoker  . Smokeless tobacco: Never Used  . Alcohol use Yes     Comment: occ    No current facility-administered medications for this encounter.   Current Outpatient Prescriptions:  .  fexofenadine (ALLEGRA) 180 MG tablet, Take 180 mg by mouth daily as needed for allergies or rhinitis., Disp: , Rfl:  .  omeprazole (PRILOSEC) 40 MG capsule, Take 40 mg by mouth daily., Disp: , Rfl:  .  diphenoxylate-atropine (LOMOTIL) 2.5-0.025 MG tablet, Take 1 tablet by mouth  4 (four) times daily as needed for diarrhea or loose stools., Disp: 30 tablet, Rfl: 0 .  ibuprofen (ADVIL,MOTRIN) 600 MG tablet, Take 1 tablet (600 mg total) by mouth every 6 (six) hours as needed., Disp: 30 tablet, Rfl: 0 .  Probiotic Product (ACIDOPHILUS PROBIOTIC BLEND) CAPS, Take 2 capsules by mouth 2 (two) times daily., Disp: 60 capsule, Rfl: 0 .   simethicone (GAS-X) 80 MG chewable tablet, Chew 1 tablet (80 mg total) by mouth every 6 (six) hours as needed for flatulence., Disp: 30 tablet, Rfl: 0  Allergies  Allergen Reactions  . Morphine And Related Other (See Comments)    "whole body feels aggravated"     ROS  As noted in HPI.   Physical Exam  BP (!) 147/100 (BP Location: Right Arm)   Pulse 94   Temp 98.2 F (36.8 C) (Oral)   Resp 18   LMP 01/30/2017 (Exact Date)   SpO2 100%   Constitutional: Well developed, well nourished, no acute distress Eyes:  EOMI, conjunctiva normal bilaterally HENT: Normocephalic, atraumatic,mucus membranes moist Respiratory: Normal inspiratory effort lungs clear bilaterally Cardiovascular: Normal rate regular rhythm no murmurs rubs or gallops GI: nondistended soft, nontender, negative Murphy, no suprapubic or flank tenderness Back: No CVA tenderness. No T-spine, L-spine tenderness. No paralumbar tenderness. No pain with flexion/extension hips against resistance. No pain with internal/external rotation of hips. SLR negative bilaterally. Sensation grossly intact distally. Pain is not aggravated with torso rotation. skin: No rash, skin intact Musculoskeletal: no deformities calves symmetric, nontender Neurologic: Alert & oriented x 3, no focal neuro deficits Psychiatric: Speech and behavior appropriate   ED Course   Medications - No data to display  Orders Placed This Encounter  Procedures  . POCT urinalysis dip (device)    Standing Status:   Standing    Number of Occurrences:   1  . H.pylori screen, POC    Standing Status:   Standing    Number of Occurrences:   1  . I-STAT, chem 8    Standing Status:   Standing    Number of Occurrences:   1    Results for orders placed or performed during the hospital encounter of 02/21/17 (from the past 24 hour(s))  I-STAT, chem 8     Status: Abnormal   Collection Time: 02/21/17  7:16 PM  Result Value Ref Range   Sodium 138 135 - 145 mmol/L    Potassium 3.5 3.5 - 5.1 mmol/L   Chloride 104 101 - 111 mmol/L   BUN 7 6 - 20 mg/dL   Creatinine, Ser 1.61 0.44 - 1.00 mg/dL   Glucose, Bld 84 65 - 99 mg/dL   Calcium, Ion 0.96 0.45 - 1.40 mmol/L   TCO2 25 0 - 100 mmol/L   Hemoglobin 11.9 (L) 12.0 - 15.0 g/dL   HCT 40.9 (L) 81.1 - 91.4 %  POCT urinalysis dip (device)     Status: None   Collection Time: 02/21/17  7:17 PM  Result Value Ref Range   Glucose, UA NEGATIVE NEGATIVE mg/dL   Bilirubin Urine NEGATIVE NEGATIVE   Ketones, ur NEGATIVE NEGATIVE mg/dL   Specific Gravity, Urine 1.015 1.005 - 1.030   Hgb urine dipstick NEGATIVE NEGATIVE   pH 5.5 5.0 - 8.0   Protein, ur NEGATIVE NEGATIVE mg/dL   Urobilinogen, UA 0.2 0.0 - 1.0 mg/dL   Nitrite NEGATIVE NEGATIVE   Leukocytes, UA NEGATIVE NEGATIVE  H.pylori screen, POC     Status: None   Collection Time: 02/21/17  7:19 PM  Result Value Ref Range   H. PYLORI SCREEN, POC NEGATIVE NEGATIVE   No results found.  ED Clinical Impression  Diarrhea, unspecified type  Acute right-sided back pain, unspecified back location   ED Assessment/Plan  Previous records reviewed. As noted in history of present illness.  Her back pain does not seem musculoskeletal, as unable to reproduce any tenderness with movement or on physical exam. In the differential is gallbladder disease, PE, pneumonia, liver disease, UTI, pyelonephritis, nephrolithiasis. Patient is PERC negative- doubt PE. She has no respiratory complaints and is satting 100% on room air. Lungs clear doubt PNA.   We'll check UA, i-STAT. Also H. pylori because she states that she is "hungry all the time" and is having problems with her reflux. If these are negative, feel that the back pain and diarrhea could be from the recent gastroenteritis that she has had. She has no evidence of surgical abdomen at this time. She does not appear to be dehydrated.  She does not have any risk factors for an infectious diarrhea that would require  antibiotics.  I-STAT, UA normal, H. pylori negative  Home with supportive treatment - Lomotil, bland diet, avoid dairy, Ibuprofen 600 mg 1 g of Tylenol 4 times a day. Also requests prescription for something for gas-  simethicone. Follow up with PMD in several days, to the ER if she gets worse.  Discussed labs, MDM, plan and followup with patient . Discussed sn/sx that should prompt return to the ED. Patient  agrees with plan.   Meds ordered this encounter  Medications  . diphenoxylate-atropine (LOMOTIL) 2.5-0.025 MG tablet    Sig: Take 1 tablet by mouth 4 (four) times daily as needed for diarrhea or loose stools.    Dispense:  30 tablet    Refill:  0  . Probiotic Product (ACIDOPHILUS PROBIOTIC BLEND) CAPS    Sig: Take 2 capsules by mouth 2 (two) times daily.    Dispense:  60 capsule    Refill:  0  . ibuprofen (ADVIL,MOTRIN) 600 MG tablet    Sig: Take 1 tablet (600 mg total) by mouth every 6 (six) hours as needed.    Dispense:  30 tablet    Refill:  0  . simethicone (GAS-X) 80 MG chewable tablet    Sig: Chew 1 tablet (80 mg total) by mouth every 6 (six) hours as needed for flatulence.    Dispense:  30 tablet    Refill:  0    *This clinic note was created using Scientist, clinical (histocompatibility and immunogenetics). Therefore, there may be occasional mistakes despite careful proofreading.  ?   Domenick Gong, MD 02/21/17 1946

## 2017-03-02 ENCOUNTER — Ambulatory Visit (HOSPITAL_COMMUNITY)
Admission: EM | Admit: 2017-03-02 | Discharge: 2017-03-02 | Disposition: A | Discharge status: Home / Self Care | Payer: Medicaid Other | Attending: Emergency Medicine | Admitting: Emergency Medicine

## 2017-03-02 ENCOUNTER — Encounter (HOSPITAL_COMMUNITY): Payer: Self-pay | Admitting: Emergency Medicine

## 2017-03-02 DIAGNOSIS — Z79899 Other long term (current) drug therapy: Secondary | ICD-10-CM | POA: Diagnosis not present

## 2017-03-02 DIAGNOSIS — K219 Gastro-esophageal reflux disease without esophagitis: Secondary | ICD-10-CM | POA: Insufficient documentation

## 2017-03-02 DIAGNOSIS — J029 Acute pharyngitis, unspecified: Secondary | ICD-10-CM | POA: Insufficient documentation

## 2017-03-02 DIAGNOSIS — Z9889 Other specified postprocedural states: Secondary | ICD-10-CM | POA: Insufficient documentation

## 2017-03-02 DIAGNOSIS — N390 Urinary tract infection, site not specified: Secondary | ICD-10-CM

## 2017-03-02 DIAGNOSIS — R05 Cough: Secondary | ICD-10-CM | POA: Diagnosis not present

## 2017-03-02 DIAGNOSIS — F419 Anxiety disorder, unspecified: Secondary | ICD-10-CM | POA: Insufficient documentation

## 2017-03-02 DIAGNOSIS — Z3202 Encounter for pregnancy test, result negative: Secondary | ICD-10-CM | POA: Diagnosis not present

## 2017-03-02 LAB — POCT RAPID STREP A: Streptococcus, Group A Screen (Direct): NEGATIVE

## 2017-03-02 LAB — POCT URINALYSIS DIP (DEVICE)
Glucose, UA: NEGATIVE mg/dL
Ketones, ur: NEGATIVE mg/dL
Nitrite: POSITIVE — AB
Protein, ur: 100 mg/dL — AB
Specific Gravity, Urine: >=1.03 (ref 1.005–1.030)
Urobilinogen, UA: 0.2 mg/dL (ref 0.0–1.0)
pH: 6 (ref 5.0–8.0)

## 2017-03-02 LAB — POCT PREGNANCY, URINE: Preg Test, Ur: NEGATIVE

## 2017-03-02 IMAGING — DX DG CHEST 2V
2 series · 2 of 2 positions shown · non-contrast
Comparison: April 13, 2014.

CLINICAL DATA: Left-sided chest pain for 3 weeks.

EXAM:
CHEST  2 VIEW

[chest pa]
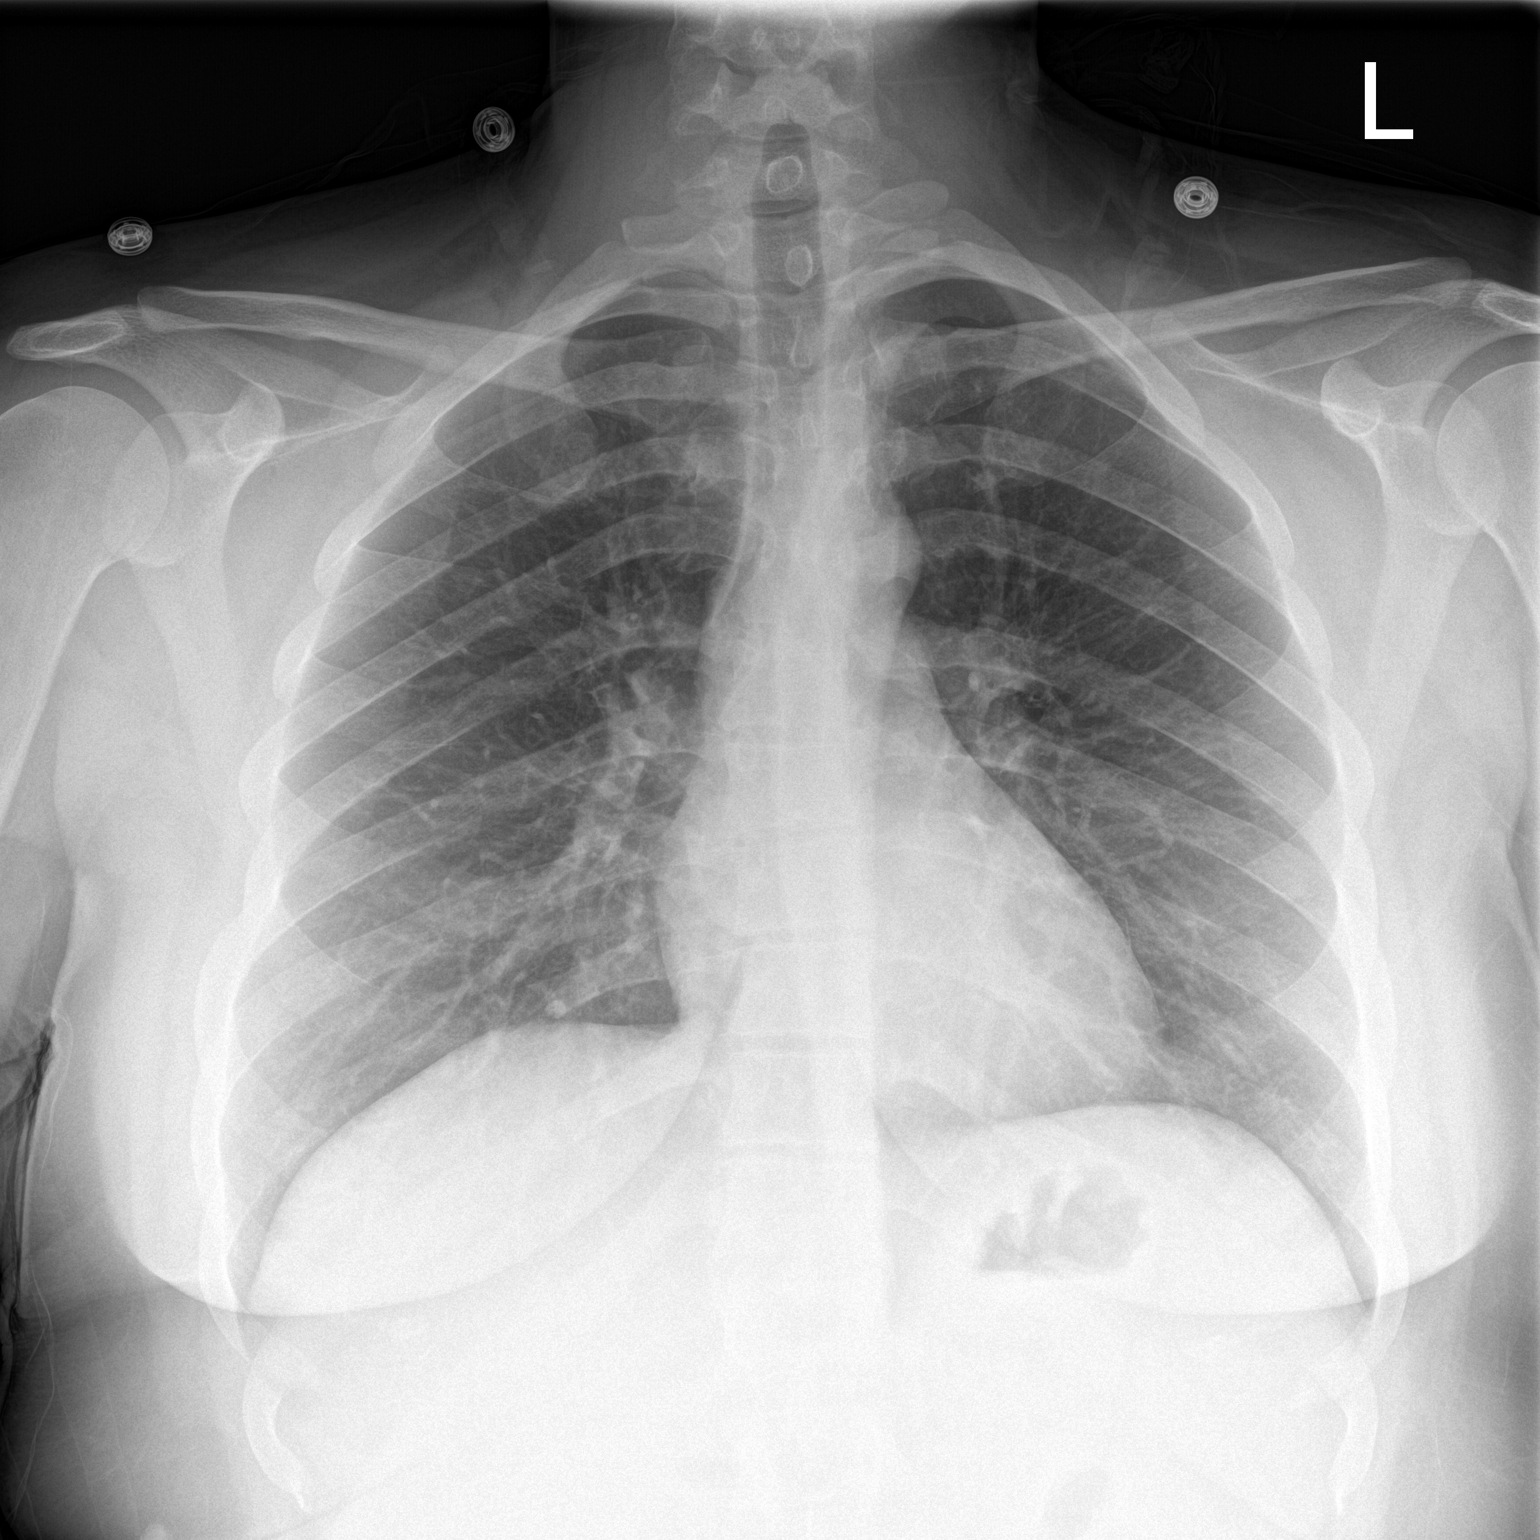

[chest lat]
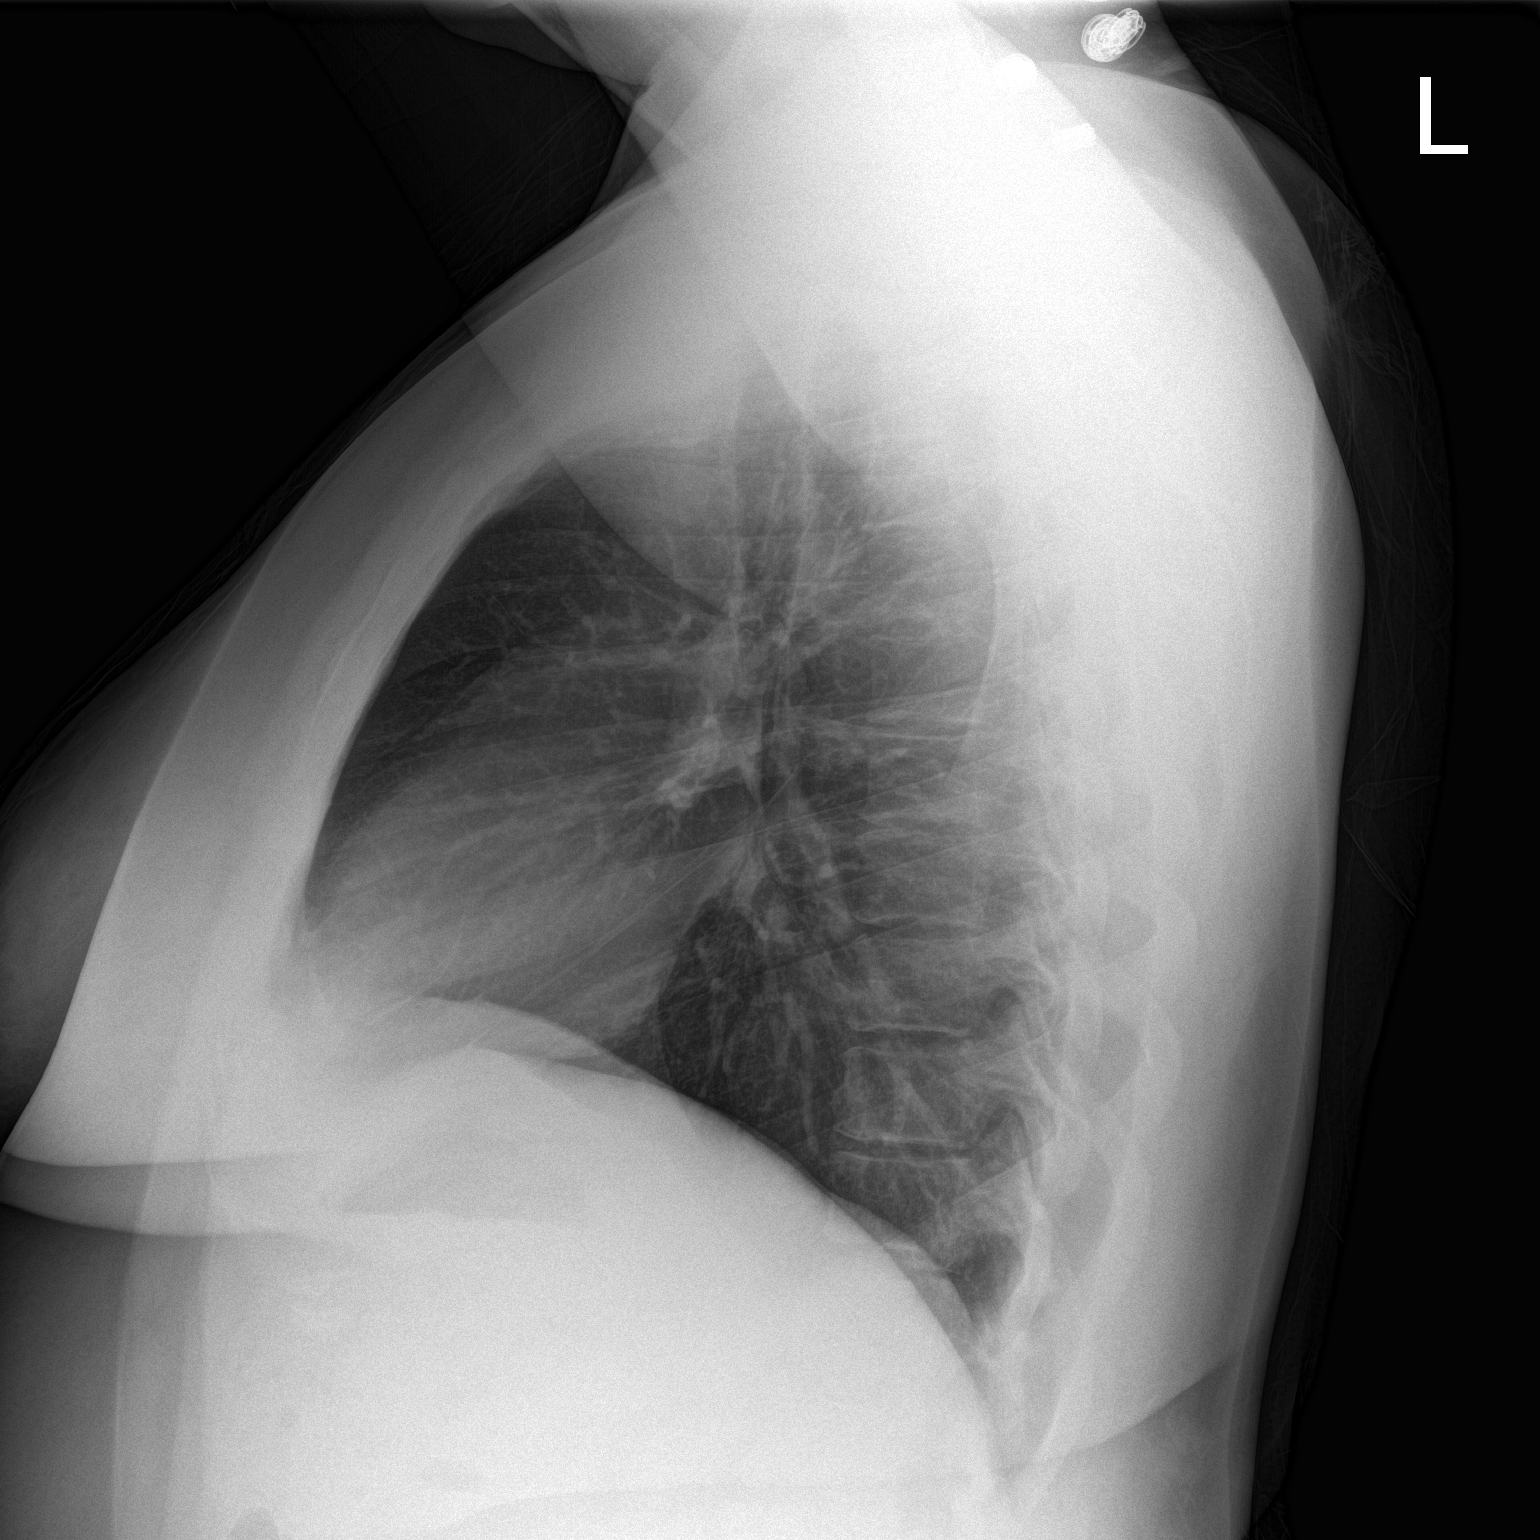

[2 of 2 positions shown; findings below may reference images not displayed]

FINDINGS: The heart size and mediastinal contours are within normal limits.
Both lungs are clear. No pneumothorax or pleural effusion is noted.
The visualized skeletal structures are unremarkable.
IMPRESSION: No active cardiopulmonary disease.

## 2017-03-02 IMAGING — DX DG ABDOMEN 1V
1 series · 1 of 1 positions shown · non-contrast
Comparison: 12/09/2010 radiographs

CLINICAL DATA: Abdominal pain, constipation and cramping for 4
days.

EXAM:
ABDOMEN - 1 VIEW

[abdomen kub]
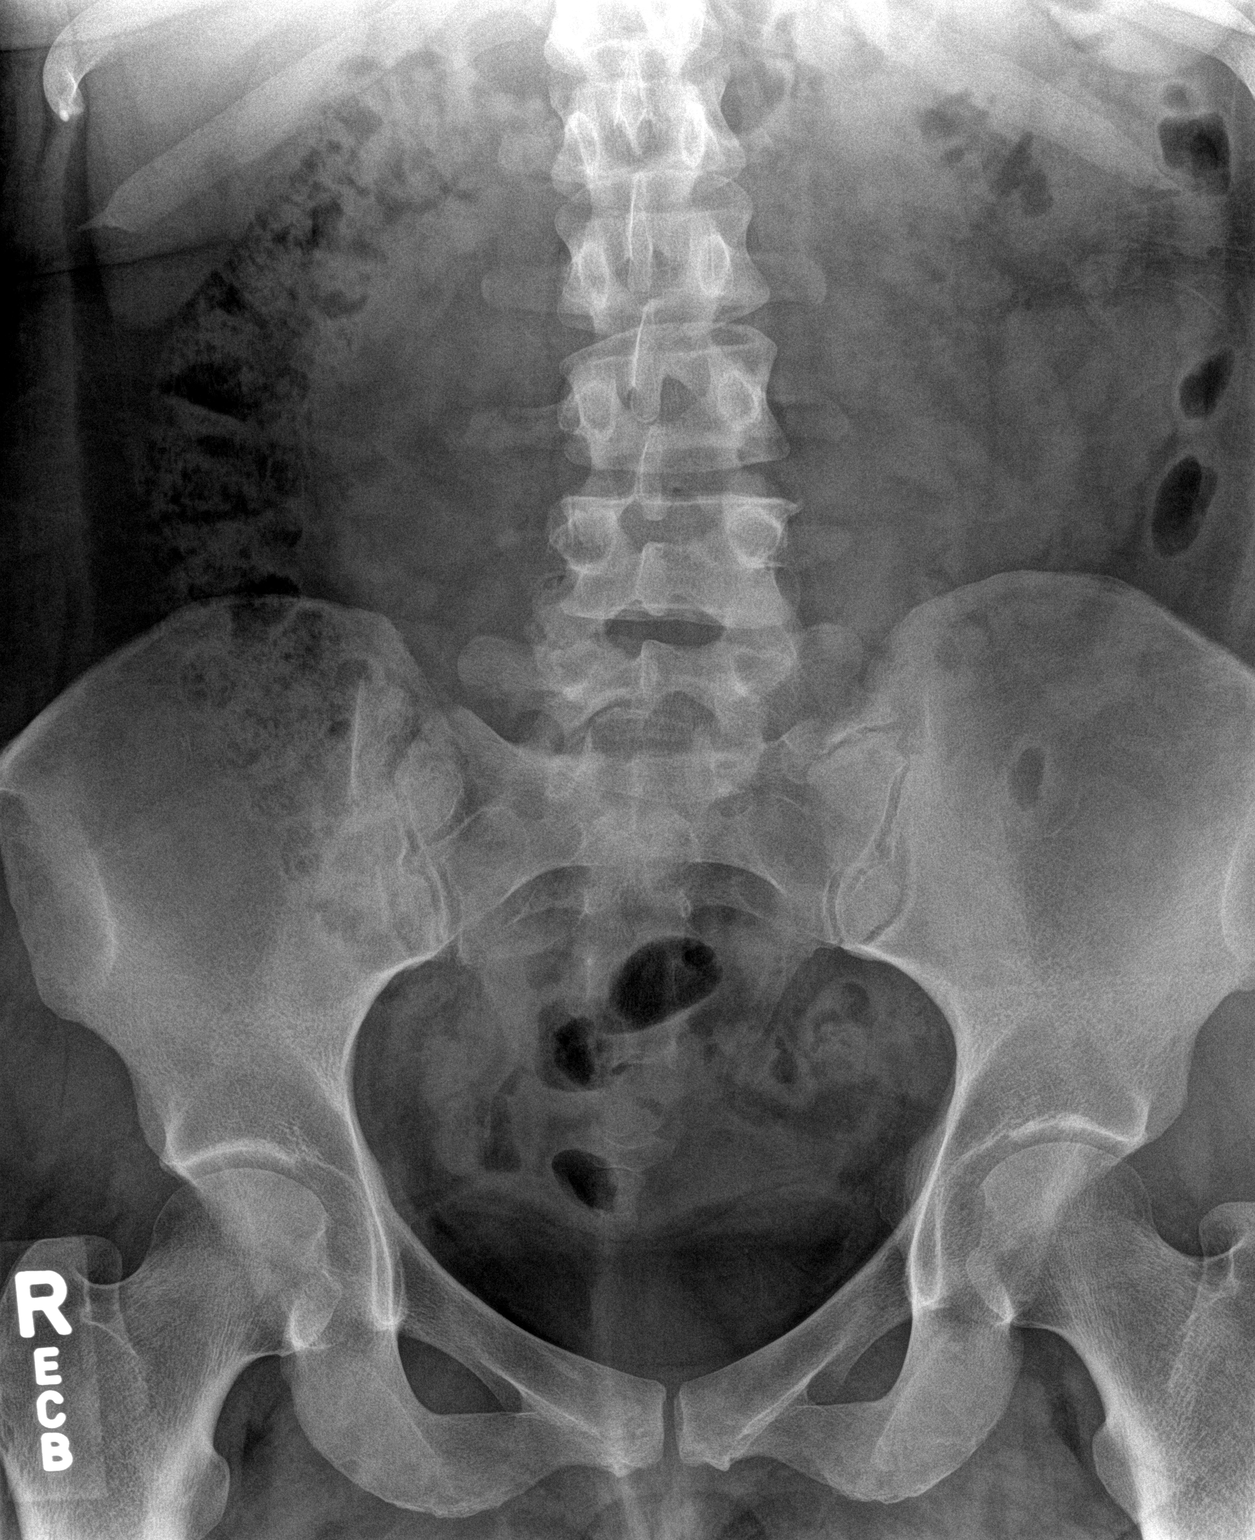

[1 of 1 positions shown; findings below may reference images not displayed]

FINDINGS: The bowel gas pattern is normal. No radio-opaque calculi or other
significant radiographic abnormality are seen.
IMPRESSION: Negative.

## 2017-03-02 MED ORDER — CEPHALEXIN 500 MG PO CAPS: 500.0000 mg | ORAL_CAPSULE | Freq: Four times a day (QID) | ORAL | 0 refills | Status: DC

## 2017-03-02 MED ORDER — PHENAZOPYRIDINE HCL 200 MG PO TABS: 200.0000 mg | ORAL_TABLET | Freq: Three times a day (TID) | ORAL | 0 refills | Status: DC | PRN

## 2017-03-02 NOTE — Discharge Instructions (Signed)
You are being treated today for a urinary tract infection. I have prescribed Keflex, take 1 tablet 4 times a day for 5 days. I have also prescribed Pyridium. Take 1 tablet a day 3 times a day for 2 days. Your urine will be sent for culture and you will be notified should any change in therapy be needed. Drink plenty of fluids and rest. Should your symptoms fail to resolve, follow up with your primary care provider or return to clinic.   For your sore throat, I recommend Chloraseptic lozenges, Chloraseptic throat spray, Tylenol, Motrin, if symptoms persist past one week, return to clinic

## 2017-03-02 NOTE — ED Triage Notes (Signed)
Patient complains of stuffiness, congestion and sore throat, worse at night.  Onset of symptoms 2 days ag  Patient has uti concerns: patient feels urgency, frequency and pain with urination that started yesterday

## 2017-03-03 NOTE — ED Provider Notes (Signed)
CSN: [id number]     Arrival date & time 03/02/17  1830 History   First MD Initiated Contact with Patient 03/02/17 1945     Chief Complaint  Patient presents with  . URI  . Urinary Tract Infection   (Consider location/radiation/quality/duration/timing/severity/associated sxs/prior Treatment) The history is provided by the patient.  URI  Presenting symptoms: congestion, cough and sore throat   Presenting symptoms: no fatigue   Severity:  Mild Onset quality:  Gradual Duration:  2 days Timing:  Constant Progression:  Worsening Chronicity:  New Relieved by:  Nothing Worsened by:  Nothing Ineffective treatments:  None tried Associated symptoms: no sinus pain   Urinary Tract Infection  Pain quality:  Aching and burning Pain severity:  Moderate Onset quality:  Gradual Duration:  2 days Timing:  Constant Progression:  Worsening Chronicity:  New Recent urinary tract infections: yes   Relieved by:  None tried Ineffective treatments:  None tried Urinary symptoms: discolored urine, foul-smelling urine and frequent urination   Urinary symptoms: no bladder incontinence   Associated symptoms: no abdominal pain, no genital lesions, no nausea, no vaginal discharge and no vomiting     Past Medical History:  Diagnosis Date  . Anxiety   . Foot pain   . Gastritis [id number]  . GERD (gastroesophageal reflux disease)   . Heartburn anxiety  . Seasonal allergies    Past Surgical History:  Procedure Laterality Date  . APPENDECTOMY N/A 05/25/2015   Procedure: APPENDECTOMY;  Surgeon: Manus Rudd, MD;  Location: [facility] OR;  Service: General;  Laterality: N/A;  . BREAST BIOPSY Right 05/03/13  . FOOT SURGERY    . WRIST SURGERY     Family History  Problem Relation Age of Onset  . Hypertension Father   . Diabetes Father   . Liver cancer Paternal Grandfather   . Diabetes Maternal Grandmother   . Kidney failure Maternal Grandmother   . Heart failure Maternal Grandmother   . Stroke Maternal  Grandmother   . Lung cancer Maternal Grandfather   . Stroke Paternal Grandmother   . Prostate cancer Neg Hx   . Bladder Cancer Neg Hx    Social History  Substance Use Topics  . Smoking status: Never Smoker  . Smokeless tobacco: Never Used  . Alcohol use Yes     Comment: occ   OB History    Gravida Para Term Preterm AB Living   2 1       1    SAB TAB Ectopic Multiple Live Births                  Obstetric Comments   1st Menstrual Cycle:  12 1st Pregnancy: 20     Review of Systems  Constitutional: Negative for activity change, chills and fatigue.  HENT: Positive for congestion and sore throat. Negative for sinus pain and sinus pressure.   Eyes: Negative.   Respiratory: Positive for cough. Negative for shortness of breath.   Cardiovascular: Negative for chest pain and palpitations.  Gastrointestinal: Negative for abdominal pain, diarrhea, nausea and vomiting.  Genitourinary: Positive for dysuria, frequency and urgency. Negative for vaginal discharge.  Musculoskeletal: Negative.   Skin: Negative.   Neurological: Negative.     Allergies  Morphine and related  Home Medications   Prior to Admission medications   Medication Sig Start Date End Date Taking? Authorizing Provider  fexofenadine (ALLEGRA) 180 MG tablet Take 180 mg by mouth daily as needed for allergies or rhinitis.   Yes Historical Provider, MD  omeprazole (PRILOSEC) 40 MG capsule Take 40 mg by mouth daily.   Yes Historical Provider, MD  PHENTERMINE HCL PO Take by mouth.   Yes Historical Provider, MD  cephALEXin (KEFLEX) 500 MG capsule Take 1 capsule (500 mg total) by mouth 4 (four) times daily. 03/02/17   Dorena BodoLawrence Anatole Apollo, NP  ibuprofen (ADVIL,MOTRIN) 600 MG tablet Take 1 tablet (600 mg total) by mouth every 6 (six) hours as needed. 02/21/17   Domenick GongAshley Mortenson, MD  phenazopyridine (PYRIDIUM) 200 MG tablet Take 1 tablet (200 mg total) by mouth 3 (three) times daily as needed for pain. 03/02/17   Dorena BodoLawrence Arsh Feutz, NP   Probiotic Product (ACIDOPHILUS PROBIOTIC BLEND) CAPS Take 2 capsules by mouth 2 (two) times daily. 02/21/17   Domenick GongAshley Mortenson, MD  simethicone (GAS-X) 80 MG chewable tablet Chew 1 tablet (80 mg total) by mouth every 6 (six) hours as needed for flatulence. 02/21/17   Domenick GongAshley Mortenson, MD   Meds Ordered and Administered this Visit  Medications - No data to display  BP 135/77 (BP Location: Right Arm)   Pulse 88   Temp 98.2 F (36.8 C) (Oral)   Resp 14   LMP 03/01/2017   SpO2 97%  No data found.   Physical Exam  Constitutional: She is oriented to person, place, and time. She appears well-developed and well-nourished. No distress.  HENT:  Head: Normocephalic and atraumatic.  Right Ear: External ear normal.  Left Ear: External ear normal.  Nose: Nose normal.  Mouth/Throat: Uvula is midline and mucous membranes are normal. Posterior oropharyngeal erythema present. No posterior oropharyngeal edema. Tonsils are 2+ on the right. Tonsils are 2+ on the left. No tonsillar exudate.  Eyes: Conjunctivae are normal. Right eye exhibits no discharge. Left eye exhibits no discharge.  Neck: Normal range of motion.  Cardiovascular: Normal rate and regular rhythm.   Pulmonary/Chest: Effort normal and breath sounds normal.  Abdominal: Soft. Bowel sounds are normal. She exhibits no distension and no mass. There is no tenderness. There is no guarding and no CVA tenderness.  Lymphadenopathy:    She has no cervical adenopathy.  Neurological: She is alert and oriented to person, place, and time.  Skin: Skin is warm and dry. Capillary refill takes less than 2 seconds. She is not diaphoretic.  Psychiatric: She has a normal mood and affect.  Nursing note and vitals reviewed.   Urgent Care Course     Procedures (including critical care time)  Labs Review Labs Reviewed  POCT URINALYSIS DIP (DEVICE) - Abnormal; Notable for the following:       Result Value   Bilirubin Urine SMALL (*)    Hgb urine  dipstick SMALL (*)    Protein, ur 100 (*)    Nitrite POSITIVE (*)    Leukocytes, UA TRACE (*)    All other components within normal limits  CULTURE, GROUP A STREP [facility])  POCT PREGNANCY, URINE  POCT RAPID STREP A    Imaging Review No results found.    MDM   1. Lower urinary tract infectious disease   2. Sore throat    Treating for UTI, urine culture obtained, patient started on Keflex, Pyridium.  Sore throat most likely viral URI, strep test negative, provided counseling on over-the-counter therapies for symptom management.    Dorena BodoLawrence Merlen Gurry, NP 03/03/17 1000

## 2017-03-05 LAB — CULTURE, GROUP A STREP (THRC)

## 2017-03-31 ENCOUNTER — Emergency Department
Admission: EM | Admit: 2017-03-31 | Discharge: 2017-03-31 | Disposition: A | Discharge status: Home / Self Care | Payer: Medicaid Other | Attending: Emergency Medicine | Admitting: Emergency Medicine

## 2017-03-31 DIAGNOSIS — W57XXXA Bitten or stung by nonvenomous insect and other nonvenomous arthropods, initial encounter: Secondary | ICD-10-CM | POA: Insufficient documentation

## 2017-03-31 DIAGNOSIS — Y929 Unspecified place or not applicable: Secondary | ICD-10-CM | POA: Diagnosis not present

## 2017-03-31 DIAGNOSIS — Y999 Unspecified external cause status: Secondary | ICD-10-CM | POA: Diagnosis not present

## 2017-03-31 DIAGNOSIS — Z79899 Other long term (current) drug therapy: Secondary | ICD-10-CM | POA: Diagnosis not present

## 2017-03-31 DIAGNOSIS — Y939 Activity, unspecified: Secondary | ICD-10-CM | POA: Insufficient documentation

## 2017-03-31 DIAGNOSIS — Z791 Long term (current) use of non-steroidal anti-inflammatories (NSAID): Secondary | ICD-10-CM | POA: Insufficient documentation

## 2017-03-31 DIAGNOSIS — Z7982 Long term (current) use of aspirin: Secondary | ICD-10-CM | POA: Diagnosis not present

## 2017-03-31 DIAGNOSIS — S20461A Insect bite (nonvenomous) of right back wall of thorax, initial encounter: Secondary | ICD-10-CM | POA: Insufficient documentation

## 2017-03-31 DIAGNOSIS — K0889 Other specified disorders of teeth and supporting structures: Secondary | ICD-10-CM | POA: Diagnosis present

## 2017-03-31 MED ORDER — BUPIVACAINE HCL (PF) 0.5 % IJ SOLN
10.0000 mL | Freq: Once | INTRAMUSCULAR | Status: DC
  Filled 2017-03-31 (×2): qty 30

## 2017-03-31 MED ORDER — DIPHENHYDRAMINE HCL 25 MG PO CAPS
25.0000 mg | ORAL_CAPSULE | Freq: Once | ORAL | Status: AC
  Administered 2017-03-31: 25 mg via ORAL
  Filled 2017-03-31: qty 1

## 2017-03-31 MED ORDER — HYDROCODONE-ACETAMINOPHEN 5-325 MG PO TABS: 1.0000 | ORAL_TABLET | Freq: Four times a day (QID) | ORAL | 0 refills | Status: DC | PRN

## 2017-03-31 MED ORDER — IBUPROFEN 600 MG PO TABS
600.0000 mg | ORAL_TABLET | Freq: Once | ORAL | Status: AC
  Administered 2017-03-31: 600 mg via ORAL
  Filled 2017-03-31: qty 1

## 2017-03-31 MED ORDER — HYDROCODONE-ACETAMINOPHEN 5-325 MG PO TABS
1.0000 | ORAL_TABLET | Freq: Once | ORAL | Status: AC
  Administered 2017-03-31: 1 via ORAL
  Filled 2017-03-31: qty 1

## 2017-03-31 NOTE — ED Provider Notes (Signed)
[facility] Emergency Department Provider Note  ____________________________________________   First MD Initiated Contact with Patient 03/31/17 [phone]     (approximate)  I have reviewed the triage vital signs and the nursing notes.   HISTORY  Chief Complaint Dental Pain and Headache   HPI [name] is a 31 y.o. female who self presents to the emergency department with 2 issues. First she notes 2 days of severe throbbing aching left upper dental pain. She said that roughly a year ago she went to a dentist who said that she needed a root canal, however her insurance would not cover it so she placed a filling instead. She was warned that this pain might recur. She also notes 3 days of itchy bug bites to her right upper back.   Past Medical History:  Diagnosis Date  . Anxiety   . Foot pain   . Gastritis [id number]  . GERD (gastroesophageal reflux disease)   . Heartburn anxiety  . Seasonal allergies     Patient Active Problem List   Diagnosis Date Noted  . Acute perforated appendicitis 05/25/2015  . Abdominal pain, left upper quadrant 02/21/2014  . Splenomegaly 02/21/2014  . Generalized anxiety disorder 02/21/2014  . Hemorrhage of rectum and anus 02/21/2014  . Lump or mass in breast 05/01/2013    Past Surgical History:  Procedure Laterality Date  . APPENDECTOMY N/A 05/25/2015   Procedure: APPENDECTOMY;  Surgeon: Manus Rudd, MD;  Location: [facility] OR;  Service: General;  Laterality: N/A;  . BREAST BIOPSY Right 05/03/13  . FOOT SURGERY    . WRIST SURGERY      Prior to Admission medications   Medication Sig Start Date End Date Taking? Authorizing Provider  cephALEXin (KEFLEX) 500 MG capsule Take 1 capsule (500 mg total) by mouth 4 (four) times daily. 03/02/17   Dorena Bodo, NP  fexofenadine (ALLEGRA) 180 MG tablet Take 180 mg by mouth daily as needed for allergies or rhinitis.    [provider]  HYDROcodone-acetaminophen (NORCO)  5-325 MG tablet Take 1 tablet by mouth every 6 (six) hours as needed for severe pain. 03/31/17   Merrily Brittle, MD  ibuprofen (ADVIL,MOTRIN) 600 MG tablet Take 1 tablet (600 mg total) by mouth every 6 (six) hours as needed. 02/21/17   Domenick Gong, MD  omeprazole (PRILOSEC) 40 MG capsule Take 40 mg by mouth daily.    [provider]  phenazopyridine (PYRIDIUM) 200 MG tablet Take 1 tablet (200 mg total) by mouth 3 (three) times daily as needed for pain. 03/02/17   Dorena Bodo, NP  PHENTERMINE HCL PO Take by mouth.    [provider]  Probiotic Product (ACIDOPHILUS PROBIOTIC BLEND) CAPS Take 2 capsules by mouth 2 (two) times daily. 02/21/17   Domenick Gong, MD  simethicone (GAS-X) 80 MG chewable tablet Chew 1 tablet (80 mg total) by mouth every 6 (six) hours as needed for flatulence. 02/21/17   Domenick Gong, MD    Allergies Morphine and related  Family History  Problem Relation Age of Onset  . Hypertension Father   . Diabetes Father   . Liver cancer Paternal Grandfather   . Diabetes Maternal Grandmother   . Kidney failure Maternal Grandmother   . Heart failure Maternal Grandmother   . Stroke Maternal Grandmother   . Lung cancer Maternal Grandfather   . Stroke Paternal Grandmother   . Prostate cancer Neg Hx   . Bladder Cancer Neg Hx     Social History Social History  Substance Use Topics  . Smoking status: Never Smoker  . Smokeless tobacco: Never Used  . Alcohol use Yes     Comment: occ    Review of Systems Constitutional: No fever/chills Eyes: No visual changes. ENT: No sore throat.Positive dental pain Cardiovascular: Denies chest pain. Respiratory: Denies shortness of breath. Gastrointestinal: No abdominal pain.  No nausea, no vomiting.  Genitourinary: Negative for dysuria. Musculoskeletal: Negative for back pain. Skin: Positive for rash. Neurological: Negative for headaches   ____________________________________________   PHYSICAL  EXAM:  VITAL SIGNS: ED Triage Vitals  Enc Vitals Group     BP 03/31/17 0439 (!) 144/96     Pulse Rate 03/31/17 0439 99     Resp --      Temp 03/31/17 0439 98.3 F (36.8 C)     Temp Source 03/31/17 0439 Oral     SpO2 03/31/17 0439 98 %     Weight 03/31/17 0434 248 lb (112.5 kg)     Height 03/31/17 0434 5\' 6"  (1.676 m)     Head Circumference --      Peak Flow --      Pain Score 03/31/17 0434 9     Pain Loc --      Pain Edu? --      Excl. in GC? --     Constitutional: Alert and oriented x 4 well appearing nontoxic no diaphoresis speaks in full, clear sentences Eyes: PERRL EOMI. Head: Atraumatic. Nose: No congestion/rhinnorhea. Mouth/Throat: No trismusUvula midline poor dentition focally tender to percussion over her left upper posterior molar Neck: No stridor.   Cardiovascular: Normal rate, regular rhythm. Grossly normal heart sounds.  Good peripheral circulation. Respiratory: Normal respiratory effort.  No retractions. Lungs CTAB and moving good air Gastrointestinal: Soft nontender Musculoskeletal: No lower extremity edema   Neurologic:  Normal speech and language. No gross focal neurologic deficits are appreciated. Skin:  Multiple excoriations along the right upper back with small bug bites Psychiatric: Mood and affect are normal. Speech and behavior are normal.    ____________________________________________  ____________________________________________   LABS (all labs ordered are listed, but only abnormal results are displayed)  Labs Reviewed - No data to display   __________________________________________  EKG   ____________________________________________  RADIOLOGY   ____________________________________________   PROCEDURES  Procedure(s) performed: yes  Posterior superior alveolar nerve block performed on the left side with a 25-gauge needle and a total of 4 cc of 0.5% bupivacaine without epinephrine with near complete anesthesia  achieved  Procedures  Critical Care performed: no  Observation: no ____________________________________________   INITIAL IMPRESSION / ASSESSMENT AND PLAN / ED COURSE  Pertinent labs & imaging results that were available during my care of the patient were reviewed by me and considered in my medical decision making (see chart for details).  The patient has dental pain but does not appear to have an dental abscess. Performed a dental block with near complete improvement in her pain. I will prescribe her a short course of opioid pain medication and refer her back to her dentist. Regarding the rash on her back and appears to be a simple bug bite with no evidence of superinfection. She is medically stable for outpatient management.      ____________________________________________   FINAL CLINICAL IMPRESSION(S) / ED DIAGNOSES  Final diagnoses:  Pain, dental  Bug bite, initial encounter      NEW MEDICATIONS STARTED DURING THIS VISIT:  Discharge Medication List as of 03/31/2017  8:12 AM    START taking these  medications   Details  HYDROcodone-acetaminophen (NORCO) 5-325 MG tablet Take 1 tablet by mouth every 6 (six) hours as needed for severe pain., Starting Thu 03/31/2017, Print         Note:  This document was prepared using Dragon voice recognition software and may include unintentional dictation errors.     Merrily Brittle, MD 04/01/17 1139

## 2017-03-31 NOTE — ED Triage Notes (Signed)
Pt presents to ED via POV with c/o dental pain, headache, and "spider bites" on her back. Pt reports having a filling done "a year ago" and told it would "eventually give her problems". Pt also reports HA with (+) nausea, denies photophobia or neck pain.

## 2017-03-31 NOTE — ED Notes (Signed)
E-signature box not working. Pt verbalized understanding of discharge instructions and denied questions. 

## 2017-03-31 NOTE — Discharge Instructions (Addendum)
Please make an appointment to see your dentist this week because only a dentist can ultimately fix the issue with your tooth. Take your pain medication only as prescribed for severe pain. Otherwise please return to the emergency department for any concerns.  OPTIONS FOR DENTAL FOLLOW UP CARE  [place] Department of Health and Human Services - Local Safety Net Dental Clinics TripDoors.comhttp://www.ncdhhs.gov/dph/oralhealth/services/safetynetclinics.htm   [facility] [phone])  [facility] [phone])  [facility] [phone]  [facility] [phone])  [facility] [phone]) This clinic caters to the indigent population and is on a lottery system. Location: [employer], [address] [address], [address], [place] [facility] Hours: Wednesdays from 6pm - 9pm, patients seen by a lottery system. For dates, call or go to [email or web address] Services: Cleanings, fillings and simple extractions. Payment Options: DENTAL WORK IS FREE OF CHARGE. Bring proof of income or support. Best way to get seen: Arrive at 5:15 pm - this is a lottery, NOT first come/first serve, so arriving earlier will not increase your chances of being seen.     [facility] [phone] Select option 1 for emergencies   Location: [facility], [address], [address], [place] [employer] [place] [place] [facility] Hours: No walk-ins accepted - call the day before to schedule an appointment. Check in times are 9:30 am and 1:30 pm. Services: Simple extractions, temporary fillings, pulpectomy/pulp debridement, uncomplicated abscess drainage. Payment Options: PAYMENT IS DUE AT THE TIME OF SERVICE.  Fee is usually $100-200, additional surgical procedures (e.g. abscess drainage) may be extra. Cash, checks, Visa/MasterCard accepted.  Can file Medicaid if patient is covered for dental - patient should call  case worker to check. No discount for [facility] patients. Best way to get seen: MUST call the day before and get onto the schedule. Can usually be seen the next 1-2 days. No walk-ins accepted.     [facility] [phone]   Location: [facility], [address], [place] [facility] Hours: M, W, Th, F 8am or 1:30pm, Tues 9a or 1:30 - first come/first served. Services: Simple extractions, temporary fillings, uncomplicated abscess drainage.  You do not need to be [place] resident. Payment Options: PAYMENT IS DUE AT THE TIME OF SERVICE. Dental insurance, otherwise sliding scale - bring proof of income or support. Depending on income and treatment needed, cost is usually $50-200. Best way to get seen: Arrive early as it is first come/first served.     [facility] [phone]   Location: [address] Clinic Hours: Mon-Thu 8a-5p Services: Most basic dental services including extractions and fillings. Payment Options: PAYMENT IS DUE AT THE TIME OF SERVICE. Sliding scale, up to 50% off - bring proof if income or support. Medicaid with dental option accepted. Best way to get seen: Call to schedule an appointment, can usually be seen within 2 weeks OR they will try to see walk-ins - show up at 8a or 2p (you may have to wait).     [facility] [phone] [place] RESIDENTS ONLY   Location: [facility], [address], [place], [place] [address] Clinic Hours: By appointment only. Monday - Thursday 8am-5pm, Friday 8am-12pm Services: Cleanings, fillings, extractions. Payment Options: PAYMENT IS DUE AT THE TIME OF SERVICE. Cash, Visa or MasterCard. Sliding scale - $30 minimum per service. Best way to get seen: Come in to office, complete packet and make an  appointment - need proof of income or support monies for each household member and proof of  [facility] residence. Usually takes about a month to get in.     [facility] [phone]   Location: [address] [facility] Clinic Hours: Walk-in Urgent Care Dental Services are offered Monday-Friday mornings only. The numbers of emergencies accepted daily is limited to the number of providers available. Maximum 15 - Mondays, Wednesdays & Thursdays Maximum 10 - Tuesdays & Fridays Services: You do not need to be a [facility] resident to be seen for a dental emergency. Emergencies are defined as pain, swelling, abnormal bleeding, or dental trauma. Walkins will receive x-rays if needed. NOTE: Dental cleaning is not an emergency. Payment Options: PAYMENT IS DUE AT THE TIME OF SERVICE. Minimum co-pay is $40.00 for uninsured patients. Minimum co-pay is $3.00 for Medicaid with dental coverage. Dental Insurance is accepted and must be presented at time of visit. Medicare does not cover dental. Forms of payment: Cash, credit card, checks. Best way to get seen: If not previously registered with the clinic, walk-in dental registration begins at 7:15 am and is on a first come/first serve basis. If previously registered with the clinic, call to make an appointment.     [facility] [phone] [place] RESIDENTS ONLY   Location: [address] [place], [place] Clinic Hours: Mon-Thu 10a-2p Services: Extractions only! Payment Options: FREE (donations accepted) - bring proof of income or support Best way to get seen: Call and schedule an appointment OR come at 8am on the 1st Monday of every month (except for holidays) when it is first come/first served.     [facility] [phone]   Location: [address] [place], [place] Clinic Hours: Friday mornings Services, Payment Options, Best way to get seen: Call for info

## 2017-04-02 ENCOUNTER — Encounter: Payer: Self-pay | Admitting: *Deleted

## 2017-04-02 ENCOUNTER — Emergency Department
Admission: EM | Admit: 2017-04-02 | Discharge: 2017-04-02 | Disposition: A | Discharge status: Home / Self Care | Payer: Medicaid Other | Attending: Emergency Medicine | Admitting: Emergency Medicine

## 2017-04-02 DIAGNOSIS — K0889 Other specified disorders of teeth and supporting structures: Secondary | ICD-10-CM | POA: Diagnosis not present

## 2017-04-02 DIAGNOSIS — I10 Essential (primary) hypertension: Secondary | ICD-10-CM | POA: Insufficient documentation

## 2017-04-02 DIAGNOSIS — Z79899 Other long term (current) drug therapy: Secondary | ICD-10-CM | POA: Insufficient documentation

## 2017-04-02 LAB — COMPREHENSIVE METABOLIC PANEL
ALT: 40 U/L (ref 14–54)
AST: 26 U/L (ref 15–41)
Albumin: 3.9 g/dL (ref 3.5–5.0)
Alkaline Phosphatase: 64 U/L (ref 38–126)
Anion gap: 6 (ref 5–15)
BUN: 11 mg/dL (ref 6–20)
CO2: 28 mmol/L (ref 22–32)
Calcium: 9.1 mg/dL (ref 8.9–10.3)
Chloride: 104 mmol/L (ref 101–111)
Creatinine, Ser: 0.6 mg/dL (ref 0.44–1.00)
GFR calc Af Amer: >60 mL/min (ref >60)
GFR calc non Af Amer: >60 mL/min (ref >60)
Glucose, Bld: 96 mg/dL (ref 65–99)
Potassium: 3.8 mmol/L (ref 3.5–5.1)
Sodium: 138 mmol/L (ref 135–145)
Total Bilirubin: 0.7 mg/dL (ref 0.3–1.2)
Total Protein: 7 g/dL (ref 6.5–8.1)

## 2017-04-02 MED ORDER — OXYCODONE HCL 5 MG PO TABS
10.0000 mg | ORAL_TABLET | Freq: Once | ORAL | Status: AC
Start: 2017-04-02 — End: 2017-04-02
  Administered 2017-04-02: 10 mg via ORAL

## 2017-04-02 MED ORDER — OXYCODONE-ACETAMINOPHEN 7.5-325 MG PO TABS: 1.0000 | ORAL_TABLET | ORAL | 0 refills | Status: DC | PRN

## 2017-04-02 MED ORDER — MORPHINE SULFATE (PF) 2 MG/ML IV SOLN
2.0000 mg | Freq: Once | INTRAVENOUS | Status: AC
  Administered 2017-04-02: 2 mg via INTRAMUSCULAR
  Filled 2017-04-02: qty 1

## 2017-04-02 MED ORDER — ONDANSETRON 4 MG PO TBDP
4.0000 mg | ORAL_TABLET | Freq: Once | ORAL | Status: AC
  Administered 2017-04-02: 4 mg via ORAL
  Filled 2017-04-02: qty 1

## 2017-04-02 MED ORDER — OXYCODONE HCL 5 MG PO TABS
ORAL_TABLET | ORAL | Status: AC
  Filled 2017-04-02: qty 2

## 2017-04-02 MED ORDER — PENICILLIN V POTASSIUM 250 MG PO TABS
500.0000 mg | ORAL_TABLET | Freq: Once | ORAL | Status: AC
  Administered 2017-04-02: 500 mg via ORAL
  Filled 2017-04-02: qty 2

## 2017-04-02 MED ORDER — PENICILLIN V POTASSIUM 500 MG PO TABS: 500.0000 mg | ORAL_TABLET | Freq: Four times a day (QID) | ORAL | 0 refills | Status: DC

## 2017-04-02 MED ORDER — LIDOCAINE VISCOUS 2 % MT SOLN
20.0000 mL | OROMUCOSAL | 0 refills | Status: DC | PRN
Start: 2017-04-02 — End: 2017-04-12

## 2017-04-02 NOTE — ED Notes (Signed)
Pt and visitor state "did you forget about us". Explanation of delay again provided to pt and visitor. Pt and visitor watching tv in no acute distress.

## 2017-04-02 NOTE — ED Notes (Signed)
Pt crying, states "that medicine you gave me isn't helping". Pt encouraged to "give the medicine ten to twenty more minutes to reach effect". Apology again provided about the wait.

## 2017-04-02 NOTE — ED Notes (Signed)
Pt requesting pain medication. Order for oxy 10mg  ir received from dr. Darnelle CatalanMalinda.

## 2017-04-02 NOTE — ED Notes (Signed)
Report to amber, rn.  

## 2017-04-02 NOTE — ED Notes (Signed)
Pt complains of pain to middle left upper tooth and middle left lower tooth. Pt states she was given vicodin "but it's not doing anything". Pt states "i'm afraid i'm gonna overdose on tylenol and goody powders if I don't get some pain relief."

## 2017-04-02 NOTE — Discharge Instructions (Signed)
Follow-up with your dentist on Tuesday as planned. Return for worse pain fever or swelling in the mouth. Take the penicillin one pill 4 times a day to help treat the infection. Use the lidocaine swish around the mouth and spit out up to 4 times a day if needed. I would only use 10 cc or 2 teaspoons at a time not the 20 that is recorded on a prescription pad. Use Percocet 14 times a day if needed. Be carefully can get addicted to that very easily. Also can make you woozy drive on it. We'll also make you constipated.

## 2017-04-02 NOTE — ED Notes (Signed)
Pt and family again updated on delay.

## 2017-04-02 NOTE — ED Triage Notes (Signed)
Pt seen two days ago for dental pain, pt reports pain is worse today

## 2017-04-12 ENCOUNTER — Encounter (HOSPITAL_COMMUNITY): Payer: Self-pay | Admitting: Emergency Medicine

## 2017-04-12 ENCOUNTER — Ambulatory Visit (HOSPITAL_COMMUNITY)
Admission: EM | Admit: 2017-04-12 | Discharge: 2017-04-12 | Disposition: A | Discharge status: Nursing Home (Includes ICF) | Payer: Medicaid Other | Attending: Internal Medicine | Admitting: Internal Medicine

## 2017-04-12 DIAGNOSIS — T161XXA Foreign body in right ear, initial encounter: Secondary | ICD-10-CM | POA: Diagnosis not present

## 2017-04-12 NOTE — ED Provider Notes (Signed)
CSN: [id number]     Arrival date & time 04/12/17  1950 History   First MD Initiated Contact with Patient 04/12/17 2012     Chief Complaint  Patient presents with  . Otalgia   (Consider location/radiation/quality/duration/timing/severity/associated sxs/prior Treatment) Patient c/o right ear pain x 2 days after being stuck in right ear with fishing pole.   The history is provided by the patient.  Otalgia  Location:  Right Behind ear:  No abnormality Quality:  Aching Severity:  Moderate Duration:  2 days Timing:  Constant Chronicity:  New Relieved by:  Nothing Worsened by:  Nothing   Past Medical History:  Diagnosis Date  . Anxiety   . Foot pain   . Gastritis [id number]  . GERD (gastroesophageal reflux disease)   . Heartburn anxiety  . Seasonal allergies    Past Surgical History:  Procedure Laterality Date  . APPENDECTOMY N/A 05/25/2015   Procedure: APPENDECTOMY;  Surgeon: Manus Rudd, MD;  [facility] OR;  Service: General;  Laterality: N/A;  . BREAST BIOPSY Right 05/03/13  . FOOT SURGERY    . WRIST SURGERY     Family History  Problem Relation Age of Onset  . Hypertension Father   . Diabetes Father   . Liver cancer Paternal Grandfather   . Diabetes Maternal Grandmother   . Kidney failure Maternal Grandmother   . Heart failure Maternal Grandmother   . Stroke Maternal Grandmother   . Lung cancer Maternal Grandfather   . Stroke Paternal Grandmother   . Prostate cancer Neg Hx   . Bladder Cancer Neg Hx    Social History  Substance Use Topics  . Smoking status: Never Smoker  . Smokeless tobacco: Never Used  . Alcohol use Yes     Comment: occ   OB History    Gravida Para Term Preterm AB Living   2 1       1    SAB TAB Ectopic Multiple Live Births                  Obstetric Comments   1st Menstrual Cycle:  12 1st Pregnancy: 20     Review of Systems  Constitutional: Negative.   HENT: Positive for ear pain.   Eyes: Negative.   Respiratory: Negative.    Cardiovascular: Negative.   Gastrointestinal: Negative.   Endocrine: Negative.   Genitourinary: Negative.   Musculoskeletal: Negative.   Allergic/Immunologic: Negative.   Neurological: Negative.   Hematological: Negative.   Psychiatric/Behavioral: Negative.     Allergies  Morphine and related  Home Medications   Prior to Admission medications   Medication Sig Start Date End Date Taking? Authorizing Provider  fexofenadine (ALLEGRA) 180 MG tablet Take 180 mg by mouth daily as needed for allergies or rhinitis.   Yes [provider]  lisinopril (PRINIVIL,ZESTRIL) 20 MG tablet Take 20 mg by mouth daily.   Yes [provider]  omeprazole (PRILOSEC) 40 MG capsule Take 40 mg by mouth daily.   Yes [provider]   Meds Ordered and Administered this Visit  Medications - No data to display  BP (!) 164/86 (BP Location: Right Arm)   Pulse 85   Temp 98.6 F (37 C) (Oral)   Resp 20   LMP 03/30/2017 (Exact Date)   SpO2 98%  No data found.   Physical Exam  Constitutional: She appears well-developed and well-nourished.  HENT:  Head: Normocephalic and atraumatic.  Left Ear: External ear normal.  Mouth/Throat: Oropharynx is clear and moist.  Right ear with erythematous eAC and FB sticking through TM.  Eyes: Conjunctivae and EOM are normal. Pupils are equal, round, and reactive to light.  Neck: Normal range of motion.  Cardiovascular: Normal rate, regular rhythm and normal heart sounds.   Pulmonary/Chest: Effort normal and breath sounds normal.  Nursing note and vitals reviewed.   Urgent Care Course     Procedures (including critical care time)  Labs Review Labs Reviewed - No data to display  Imaging Review No results found.   Visual Acuity Review  Right Eye Distance:   Left Eye Distance:   Bilateral Distance:    Right Eye Near:   Left Eye Near:    Bilateral Near:         MDM   1. Foreign body of right ear, initial encounter     Recommend follow up in ED       Deatra CanterOxford, Deshanna Kama J, [facility] TenayaFNP 04/12/17 2033

## 2017-04-12 NOTE — Discharge Instructions (Signed)
Go to ED

## 2017-04-12 NOTE — ED Triage Notes (Addendum)
[facility] complaint right ear pain x 2 days after being stuck in the ear with the end of a fishing rod.

## 2017-04-18 ENCOUNTER — Inpatient Hospital Stay
Admission: EM | Admit: 2017-04-18 | Discharge: 2017-04-23 | DRG: 372 | Disposition: A | Discharge status: Home / Self Care | Payer: Medicaid Other | Attending: Internal Medicine | Admitting: Internal Medicine

## 2017-04-18 ENCOUNTER — Encounter: Payer: Self-pay | Admitting: Emergency Medicine

## 2017-04-18 DIAGNOSIS — E876 Hypokalemia: Secondary | ICD-10-CM

## 2017-04-18 DIAGNOSIS — E860 Dehydration: Secondary | ICD-10-CM

## 2017-04-18 DIAGNOSIS — E871 Hypo-osmolality and hyponatremia: Secondary | ICD-10-CM

## 2017-04-18 DIAGNOSIS — Z833 Family history of diabetes mellitus: Secondary | ICD-10-CM

## 2017-04-18 DIAGNOSIS — K222 Esophageal obstruction: Secondary | ICD-10-CM | POA: Diagnosis present

## 2017-04-18 DIAGNOSIS — R112 Nausea with vomiting, unspecified: Secondary | ICD-10-CM

## 2017-04-18 DIAGNOSIS — A0472 Enterocolitis due to Clostridium difficile, not specified as recurrent: Principal | ICD-10-CM

## 2017-04-18 DIAGNOSIS — R110 Nausea: Secondary | ICD-10-CM

## 2017-04-18 DIAGNOSIS — Z6841 Body Mass Index (BMI) 40.0 and over, adult: Secondary | ICD-10-CM

## 2017-04-18 DIAGNOSIS — Z8249 Family history of ischemic heart disease and other diseases of the circulatory system: Secondary | ICD-10-CM

## 2017-04-18 DIAGNOSIS — R109 Unspecified abdominal pain: Secondary | ICD-10-CM | POA: Diagnosis present

## 2017-04-18 DIAGNOSIS — K219 Gastro-esophageal reflux disease without esophagitis: Secondary | ICD-10-CM | POA: Diagnosis present

## 2017-04-18 DIAGNOSIS — K449 Diaphragmatic hernia without obstruction or gangrene: Secondary | ICD-10-CM | POA: Diagnosis present

## 2017-04-18 DIAGNOSIS — I10 Essential (primary) hypertension: Secondary | ICD-10-CM | POA: Diagnosis present

## 2017-04-18 DIAGNOSIS — K2970 Gastritis, unspecified, without bleeding: Secondary | ICD-10-CM | POA: Diagnosis present

## 2017-04-18 DIAGNOSIS — R52 Pain, unspecified: Secondary | ICD-10-CM

## 2017-04-18 HISTORY — DX: Essential (primary) hypertension: I10

## 2017-04-18 LAB — LIPASE, BLOOD: Lipase: 23 U/L (ref 11–51)

## 2017-04-18 LAB — COMPREHENSIVE METABOLIC PANEL
ALT: 41 U/L (ref 14–54)
AST: 49 U/L — ABNORMAL HIGH (ref 15–41)
Albumin: 4.1 g/dL (ref 3.5–5.0)
Alkaline Phosphatase: 63 U/L (ref 38–126)
Anion gap: 7 (ref 5–15)
BUN: 11 mg/dL (ref 6–20)
CO2: 19 mmol/L — ABNORMAL LOW (ref 22–32)
Calcium: 9.2 mg/dL (ref 8.9–10.3)
Chloride: 107 mmol/L (ref 101–111)
Creatinine, Ser: 0.78 mg/dL (ref 0.44–1.00)
GFR calc Af Amer: >60 mL/min (ref >60)
GFR calc non Af Amer: >60 mL/min (ref >60)
Glucose, Bld: 134 mg/dL — ABNORMAL HIGH (ref 65–99)
Potassium: 4.9 mmol/L (ref 3.5–5.1)
Sodium: 133 mmol/L — ABNORMAL LOW (ref 135–145)
Total Bilirubin: 0.7 mg/dL (ref 0.3–1.2)
Total Protein: 7.6 g/dL (ref 6.5–8.1)

## 2017-04-18 LAB — URINALYSIS, COMPLETE (UACMP) WITH MICROSCOPIC
Bilirubin Urine: NEGATIVE
Glucose, UA: NEGATIVE mg/dL
Hgb urine dipstick: NEGATIVE
Ketones, ur: NEGATIVE mg/dL
Leukocytes, UA: NEGATIVE
Nitrite: NEGATIVE
Protein, ur: NEGATIVE mg/dL
RBC / HPF: NONE SEEN RBC/hpf (ref 0–5)
Specific Gravity, Urine: 1.017 (ref 1.005–1.030)
pH: 6 (ref 5.0–8.0)

## 2017-04-18 LAB — PREGNANCY, URINE: Preg Test, Ur: NEGATIVE

## 2017-04-18 LAB — CBC
HCT: 37.7 % (ref 35.0–47.0)
Hemoglobin: 12.6 g/dL (ref 12.0–16.0)
MCH: 25.7 pg — ABNORMAL LOW (ref 26.0–34.0)
MCHC: 33.3 g/dL (ref 32.0–36.0)
MCV: 77.2 fL — ABNORMAL LOW (ref 80.0–100.0)
Platelets: 263 10*3/uL (ref 150–440)
RBC: 4.89 MIL/uL (ref 3.80–5.20)
RDW: 16.5 % — ABNORMAL HIGH (ref 11.5–14.5)
WBC: 10.3 10*3/uL (ref 3.6–11.0)

## 2017-04-18 MED ORDER — LISINOPRIL 20 MG PO TABS
20.0000 mg | ORAL_TABLET | Freq: Every day | ORAL | Status: DC
  Administered 2017-04-19: 20 mg via ORAL
  Filled 2017-04-18: qty 1

## 2017-04-18 MED ORDER — ONDANSETRON HCL 4 MG/2ML IJ SOLN
4.0000 mg | Freq: Once | INTRAMUSCULAR | Status: AC
  Administered 2017-04-18: 4 mg via INTRAVENOUS
  Filled 2017-04-18: qty 2

## 2017-04-18 MED ORDER — HYDRALAZINE HCL 20 MG/ML IJ SOLN
INTRAMUSCULAR | Status: AC
  Filled 2017-04-18: qty 1

## 2017-04-18 MED ORDER — PROMETHAZINE HCL 25 MG/ML IJ SOLN
12.5000 mg | Freq: Once | INTRAMUSCULAR | Status: AC
  Administered 2017-04-18: 19:00:00 12.5 mg via INTRAVENOUS
  Filled 2017-04-18 (×4): qty 1

## 2017-04-18 MED ORDER — ACETAMINOPHEN 325 MG PO TABS: 650.0000 mg | ORAL_TABLET | Freq: Four times a day (QID) | ORAL | Status: DC | PRN

## 2017-04-18 MED ORDER — HYDRALAZINE HCL 20 MG/ML IJ SOLN
10.0000 mg | INTRAMUSCULAR | Status: DC | PRN
  Administered 2017-04-18 – 2017-04-20 (×3): 10 mg via INTRAVENOUS
  Filled 2017-04-18 (×2): qty 1

## 2017-04-18 MED ORDER — SODIUM CHLORIDE 0.9 % IV SOLN
INTRAVENOUS | Status: DC
  Administered 2017-04-18 – 2017-04-23 (×16): via INTRAVENOUS

## 2017-04-18 MED ORDER — HYDROMORPHONE HCL 1 MG/ML IJ SOLN
0.5000 mg | Freq: Once | INTRAMUSCULAR | Status: AC
  Administered 2017-04-18: 0.5 mg via INTRAVENOUS
  Filled 2017-04-18: qty 0.5

## 2017-04-18 MED ORDER — SENNOSIDES-DOCUSATE SODIUM 8.6-50 MG PO TABS: 1.0000 | ORAL_TABLET | Freq: Every evening | ORAL | Status: DC | PRN

## 2017-04-18 MED ORDER — KETOROLAC TROMETHAMINE 30 MG/ML IJ SOLN
30.0000 mg | Freq: Once | INTRAMUSCULAR | Status: AC
  Administered 2017-04-18: 30 mg via INTRAVENOUS
  Filled 2017-04-18: qty 1

## 2017-04-18 MED ORDER — OXYCODONE-ACETAMINOPHEN 5-325 MG PO TABS
1.0000 | ORAL_TABLET | Freq: Four times a day (QID) | ORAL | Status: DC | PRN
  Administered 2017-04-19 – 2017-04-23 (×7): 1 via ORAL
  Filled 2017-04-18 (×8): qty 1

## 2017-04-18 MED ORDER — ENOXAPARIN SODIUM 40 MG/0.4ML ~~LOC~~ SOLN
40.0000 mg | Freq: Two times a day (BID) | SUBCUTANEOUS | Status: DC
  Administered 2017-04-18 – 2017-04-22 (×7): 40 mg via SUBCUTANEOUS
  Filled 2017-04-18 (×7): qty 0.4

## 2017-04-18 MED ORDER — PROMETHAZINE HCL 25 MG/ML IJ SOLN
25.0000 mg | Freq: Once | INTRAMUSCULAR | Status: AC
  Administered 2017-04-18: 25 mg via INTRAVENOUS
  Filled 2017-04-18: qty 1

## 2017-04-18 MED ORDER — SODIUM CHLORIDE 0.9 % IV BOLUS (SEPSIS)
1000.0000 mL | Freq: Once | INTRAVENOUS | Status: AC
  Administered 2017-04-18: 1000 mL via INTRAVENOUS

## 2017-04-18 MED ORDER — FLUTICASONE PROPIONATE 50 MCG/ACT NA SUSP
1.0000 | Freq: Every day | NASAL | Status: DC | PRN
  Filled 2017-04-18: qty 16

## 2017-04-18 MED ORDER — ONDANSETRON HCL 4 MG/2ML IJ SOLN
4.0000 mg | Freq: Four times a day (QID) | INTRAMUSCULAR | Status: DC | PRN
  Administered 2017-04-18 – 2017-04-23 (×14): 4 mg via INTRAVENOUS
  Filled 2017-04-18 (×14): qty 2

## 2017-04-18 MED ORDER — METOCLOPRAMIDE HCL 5 MG/ML IJ SOLN
10.0000 mg | Freq: Once | INTRAMUSCULAR | Status: AC
  Administered 2017-04-18: 10 mg via INTRAVENOUS
  Filled 2017-04-18: qty 2

## 2017-04-18 MED ORDER — ONDANSETRON HCL 4 MG PO TABS
4.0000 mg | ORAL_TABLET | Freq: Four times a day (QID) | ORAL | Status: DC | PRN
  Administered 2017-04-23: 09:00:00 4 mg via ORAL
  Filled 2017-04-18: qty 1

## 2017-04-18 MED ORDER — FENTANYL CITRATE (PF) 100 MCG/2ML IJ SOLN
50.0000 ug | Freq: Once | INTRAMUSCULAR | Status: AC
  Administered 2017-04-18: 50 ug via INTRAVENOUS
  Filled 2017-04-18: qty 2

## 2017-04-18 MED ORDER — KETOROLAC TROMETHAMINE 30 MG/ML IJ SOLN
30.0000 mg | Freq: Four times a day (QID) | INTRAMUSCULAR | Status: DC | PRN
  Administered 2017-04-18: 30 mg via INTRAVENOUS
  Filled 2017-04-18: qty 1

## 2017-04-18 MED ORDER — ACETAMINOPHEN 650 MG RE SUPP: 650.0000 mg | Freq: Four times a day (QID) | RECTAL | Status: DC | PRN

## 2017-04-18 MED ORDER — TRAZODONE HCL 100 MG PO TABS
100.0000 mg | ORAL_TABLET | Freq: Every evening | ORAL | Status: DC | PRN
  Administered 2017-04-18 – 2017-04-19 (×2): 100 mg via ORAL
  Filled 2017-04-18 (×2): qty 1

## 2017-04-18 MED ORDER — PANTOPRAZOLE SODIUM 40 MG PO TBEC
40.0000 mg | DELAYED_RELEASE_TABLET | Freq: Every day | ORAL | Status: DC
  Administered 2017-04-19 – 2017-04-23 (×3): 40 mg via ORAL
  Filled 2017-04-18 (×3): qty 1

## 2017-04-18 NOTE — ED Notes (Signed)
Walked into patients room she is actively vomiting. Did change blood pressure cuff to other arm.

## 2017-04-18 NOTE — ED Notes (Signed)
Patient does take blood pressure medication at home but has not taken it due to vomiting.

## 2017-04-18 NOTE — ED Notes (Signed)
Attempted to start IV x2, attempts unsuccessful

## 2017-04-18 NOTE — H&P (Addendum)
[facility] - [place] at [facility]   PATIENT NAME: [name]    MR#:  [id number]  DATE OF BIRTH:  August 15, 1986  DATE OF ADMISSION:  04/18/2017  PRIMARY CARE PHYSICIAN: Fayrene Helper, NP   REQUESTING/REFERRING PHYSICIAN:   CHIEF COMPLAINT:   Chief Complaint  Patient presents with  . Abdominal Pain  . Emesis    HISTORY OF PRESENT ILLNESS: [name]  is a 31 y.o. female with a known history of Anxiety disorder, gastritis, GERD presented to the emergency room with nausea and vomiting since last 2 days. Vomitus contained food and water. Patient also complains of epigastric discomfort. No history of any vomiting of blood. No history of any rectal bleed. No fever and chills. Patient was given antiemetics in the emergency room but still had some nausea and vomiting. No complaints of any chest pain, shortness of breath. Hospitalist service was consulted for further care of the patient.  PAST MEDICAL HISTORY:   Past Medical History:  Diagnosis Date  . Anxiety   . Foot pain   . Gastritis [id number]  . GERD (gastroesophageal reflux disease)   . Heartburn anxiety  . Seasonal allergies     PAST SURGICAL HISTORY: Past Surgical History:  Procedure Laterality Date  . APPENDECTOMY N/A 05/25/2015   Procedure: APPENDECTOMY;  Surgeon: Manus Rudd, MD;  Location: [facility] OR;  Service: General;  Laterality: N/A;  . BREAST BIOPSY Right 05/03/13  . FOOT SURGERY    . WRIST SURGERY      SOCIAL HISTORY:  Social History  Substance Use Topics  . Smoking status: Never Smoker  . Smokeless tobacco: Never Used  . Alcohol use Yes     Comment: occ    FAMILY HISTORY:  Family History  Problem Relation Age of Onset  . Hypertension Father   . Diabetes Father   . Liver cancer Paternal Grandfather   . Diabetes Maternal Grandmother   . Kidney failure Maternal Grandmother   . Heart failure Maternal Grandmother   . Stroke Maternal Grandmother   . Lung cancer Maternal  Grandfather   . Stroke Paternal Grandmother   . Prostate cancer Neg Hx   . Bladder Cancer Neg Hx     DRUG ALLERGIES:  Allergies  Allergen Reactions  . Morphine And Related Other (See Comments)    "whole body feels aggravated"    REVIEW OF SYSTEMS:   CONSTITUTIONAL: No fever, fatigue or weakness.  EYES: No blurred or double vision.  EARS, NOSE, AND THROAT: No tinnitus or ear pain.  RESPIRATORY: No cough, shortness of breath, wheezing or hemoptysis.  CARDIOVASCULAR: No chest pain, orthopnea, edema.  GASTROINTESTINAL: Has nausea, vomiting,  abdominal pain.  No diarrhea noted GENITOURINARY: No dysuria, hematuria.  ENDOCRINE: No polyuria, nocturia,  HEMATOLOGY: No anemia, easy bruising or bleeding SKIN: No rash or lesion. MUSCULOSKELETAL: No joint pain or arthritis.   NEUROLOGIC: No tingling, numbness, weakness.  PSYCHIATRY: No anxiety or depression.   MEDICATIONS AT HOME:  Prior to Admission medications   Medication Sig Start Date End Date Taking? Authorizing Provider  fexofenadine (ALLEGRA) 180 MG tablet Take 180 mg by mouth daily as needed for allergies or rhinitis.   Yes [provider]  fluticasone (FLONASE) 50 MCG/ACT nasal spray Place 1-2 sprays into both nostrils daily. 03/01/17  Yes [provider]  ibuprofen (ADVIL,MOTRIN) 800 MG tablet Take 800 mg by mouth daily as needed for headache. 03/09/17  Yes [provider]  lisinopril (PRINIVIL,ZESTRIL) 20 MG tablet Take 20  mg by mouth daily.   Yes [provider]  omeprazole (PRILOSEC) 40 MG capsule Take 40 mg by mouth daily.   Yes [provider]  traZODone (DESYREL) 100 MG tablet Take 100 mg by mouth at bedtime as needed for sleep.   Yes [provider]      PHYSICAL EXAMINATION:   VITAL SIGNS: Blood pressure (!) 164/111, pulse 98, temperature 97.8 F (36.6 C), temperature source Oral, resp. rate 18, height 5\' 6"  (1.676 m), weight 112.5 kg (248 lb), last menstrual period  03/30/2017, SpO2 100 %.  GENERAL:  31 y.o.-year-old patient lying in the bed with no acute distress.  EYES: Pupils equal, round, reactive to light and accommodation. No scleral icterus. Extraocular muscles intact.  HEENT: Head atraumatic, normocephalic. Oropharynx and nasopharynx clear.  NECK:  Supple, no jugular venous distention. No thyroid enlargement, no tenderness.  LUNGS: Normal breath sounds bilaterally, no wheezing, rales,rhonchi or crepitation. No use of accessory muscles of respiration.  CARDIOVASCULAR: S1, S2 normal. No murmurs, rubs, or gallops.  ABDOMEN: Soft, tenderness in epigastrium, nondistended. Bowel sounds present. No organomegaly or mass.  EXTREMITIES: No pedal edema, cyanosis, or clubbing.  NEUROLOGIC: Cranial nerves II through XII are intact. Muscle strength 5/5 in all extremities. Sensation intact. Gait not checked.  PSYCHIATRIC: The patient is alert and oriented x 3.  SKIN: No obvious rash, lesion, or ulcer.   LABORATORY PANEL:   CBC  Recent Labs Lab 04/18/17 1016  WBC 10.3  HGB 12.6  HCT 37.7  PLT 263  MCV 77.2*  MCH 25.7*  MCHC 33.3  RDW 16.5*   ------------------------------------------------------------------------------------------------------------------  Chemistries   Recent Labs Lab 04/18/17 1016  NA 133*  K 4.9  CL 107  CO2 19*  GLUCOSE 134*  BUN 11  CREATININE 0.78  CALCIUM 9.2  AST 49*  ALT 41  ALKPHOS 63  BILITOT 0.7   ------------------------------------------------------------------------------------------------------------------ estimated creatinine clearance is 129.6 mL/min (by C-G formula based on SCr of 0.78 mg/dL). ------------------------------------------------------------------------------------------------------------------ No results for input(s): TSH, T4TOTAL, T3FREE, THYROIDAB in the last 72 hours.  Invalid input(s): FREET3   Coagulation profile No results for input(s): INR, PROTIME in the last 168  hours. ------------------------------------------------------------------------------------------------------------------- No results for input(s): DDIMER in the last 72 hours. -------------------------------------------------------------------------------------------------------------------  Cardiac Enzymes No results for input(s): CKMB, TROPONINI, MYOGLOBIN in the last 168 hours.  Invalid input(s): CK ------------------------------------------------------------------------------------------------------------------ Invalid input(s): POCBNP  ---------------------------------------------------------------------------------------------------------------  Urinalysis    Component Value Date/Time   COLORURINE YELLOW (A) 02/10/2017 0656   APPEARANCEUR HAZY (A) 02/10/2017 0656   APPEARANCEUR Cloudy (A) 10/04/2016 1533   LABSPEC >=1.030 03/02/2017 1928   LABSPEC 1.011 02/27/2015 0035   PHURINE 6.0 03/02/2017 1928   GLUCOSEU NEGATIVE 03/02/2017 1928   GLUCOSEU Negative 02/27/2015 0035   HGBUR SMALL (A) 03/02/2017 1928   BILIRUBINUR SMALL (A) 03/02/2017 1928   BILIRUBINUR Negative 10/04/2016 1533   BILIRUBINUR Negative 02/27/2015 0035   KETONESUR NEGATIVE 03/02/2017 1928   PROTEINUR 100 (A) 03/02/2017 1928   UROBILINOGEN 0.2 03/02/2017 1928   NITRITE POSITIVE (A) 03/02/2017 1928   LEUKOCYTESUR TRACE (A) 03/02/2017 1928   LEUKOCYTESUR Negative 10/04/2016 1533   LEUKOCYTESUR Negative 02/27/2015 0035     RADIOLOGY: No results found.  EKG: Orders placed or performed during the hospital encounter of 06/29/15  . EKG 12-Lead  . EKG 12-Lead  . EKG    IMPRESSION AND PLAN: 31 year old female patient with history of anxiety disorder, GERD presented to the emergency room with epigastric discomfort, nausea and vomiting. Admitting diagnosis  1. Acute gastritis 2. Intractable nausea vomiting 3. Hyponatremia 4. GERD Treatment plan Admit patient to medical floor observation blood IV  fluid hydration Antiemetics Follow-up sodium level Pain management with IV Toradol as needed Supportive care  All the records are reviewed and case discussed with ED provider. Management plans discussed with the patient, family and they are in agreement.  CODE STATUS:FULL CODE Code Status History    Date Active Date Inactive Code Status Order ID Comments User Context   05/25/2015  8:22 PM 06/01/2015  6:33 PM Full Code [id number]  Manus Ruddsuei, Matthew, MD Inpatient       TOTAL TIME TAKING CARE OF THIS PATIENT: 50 minutes.    Ihor AustinPavan Zarai Orsborn M.D on 04/18/2017 at 3:51 PM  Between 7am to 6pm - Pager - [phone]  After 6pm go to [email or web address] - password [email or web address] [facility]  [facility]  Office  [phone]  CC: Primary care physician; Fayrene HelperBoswell, Chelsa H, NP

## 2017-04-18 NOTE — ED Notes (Signed)
Patient is laying on stomach resting when entering room. NAD at this time.

## 2017-04-18 NOTE — Progress Notes (Signed)
Notified Dr Tobi BastosPyreddy that patient was having 9 out of 10 abdominal pain and was asking for phenergan. MD ordered 12.5 phenergan IV once and give toradol ordered.

## 2017-04-18 NOTE — ED Notes (Signed)
Per lab they do not have urine on patient.

## 2017-04-18 NOTE — ED Notes (Signed)
Pt still c/o pain and vomiting. edp will be notified.

## 2017-04-18 NOTE — ED Triage Notes (Signed)
C/O abdominal pain, vomiting, diarrhea x 2 days. Abdominal pain is generalized, but worse to LLQ.

## 2017-04-18 NOTE — ED Notes (Signed)
Pt still in pain and still nauseated. edp aware.

## 2017-04-18 NOTE — ED Notes (Signed)
Patient states her blood pressure is usually not this elevated.

## 2017-04-18 NOTE — Progress Notes (Signed)
Anticoagulation monitoring(Lovenox):  31 yo female ordered Lovenox 40 mg Q24h  Filed Weights   04/18/17 1005  Weight: 248 lb (112.5 kg)   BMI 40.1   Lab Results  Component Value Date   CREATININE 0.78 04/18/2017   CREATININE 0.60 04/02/2017   CREATININE 0.60 02/21/2017   Estimated Creatinine Clearance: 129.6 mL/min (by C-G formula based on SCr of 0.78 mg/dL). Hemoglobin & Hematocrit     Component Value Date/Time   HGB 12.6 04/18/2017 1016   HGB 13.1 02/27/2015 0035   HCT 37.7 04/18/2017 1016   HCT 38.7 02/27/2015 0035     Per Protocol for Patient with estCrcl > 30 ml/min and BMI > 40, will transition to Lovenox 40 mg Q12h.

## 2017-04-18 NOTE — ED Provider Notes (Signed)
[facility] Emergency Department Provider Note  Time seen: 11:16 AM  I have reviewed the triage vital signs and the nursing notes.   HISTORY  Chief Complaint Abdominal Pain and Emesis    HPI [name] is a 31 y.o. female with a past medical history of anxiety, gastritis, presents to the emergency department for upper abdominal discomfort nausea and vomiting. According to the patient since yesterday she has been nauseated with frequent episodes of vomiting and upper abdominal discomfort. States a history of gastritis but has not had a flare up of gastritis in many years per patient. She states occasional episodes of loose stool as well. Denies any fever. Denies dysuria. Her last menstrual period was approximately 2 weeks ago. Denies any chest pain or trouble breathing. Denies fever.  Past Medical History:  Diagnosis Date  . Anxiety   . Foot pain   . Gastritis 1610,9604  . GERD (gastroesophageal reflux disease)   . Heartburn anxiety  . Seasonal allergies     Patient Active Problem List   Diagnosis Date Noted  . Acute perforated appendicitis 05/25/2015  . Abdominal pain, left upper quadrant 02/21/2014  . Splenomegaly 02/21/2014  . Generalized anxiety disorder 02/21/2014  . Hemorrhage of rectum and anus 02/21/2014  . Lump or mass in breast 05/01/2013    Past Surgical History:  Procedure Laterality Date  . APPENDECTOMY N/A 05/25/2015   Procedure: APPENDECTOMY;  Surgeon: Manus Rudd, MD;  Location: [facility] OR;  Service: General;  Laterality: N/A;  . BREAST BIOPSY Right 05/03/13  . FOOT SURGERY    . WRIST SURGERY      Prior to Admission medications   Medication Sig Start Date End Date Taking? Authorizing Provider  fexofenadine (ALLEGRA) 180 MG tablet Take 180 mg by mouth daily as needed for allergies or rhinitis.    [provider]  lisinopril (PRINIVIL,ZESTRIL) 20 MG tablet Take 20 mg by mouth daily.    [provider]  omeprazole  (PRILOSEC) 40 MG capsule Take 40 mg by mouth daily.    [provider]    Allergies  Allergen Reactions  . Morphine And Related Other (See Comments)    "whole body feels aggravated"    Family History  Problem Relation Age of Onset  . Hypertension Father   . Diabetes Father   . Liver cancer Paternal Grandfather   . Diabetes Maternal Grandmother   . Kidney failure Maternal Grandmother   . Heart failure Maternal Grandmother   . Stroke Maternal Grandmother   . Lung cancer Maternal Grandfather   . Stroke Paternal Grandmother   . Prostate cancer Neg Hx   . Bladder Cancer Neg Hx     Social History Social History  Substance Use Topics  . Smoking status: Never Smoker  . Smokeless tobacco: Never Used  . Alcohol use Yes     Comment: occ    Review of Systems Constitutional: Negative for fever. Cardiovascular: Negative for chest pain. Respiratory: Negative for shortness of breath. Gastrointestinal: Upper abdominal discomfort, nausea and vomiting. Occasional loose stool. Genitourinary: Negative for dysuria. Musculoskeletal: Negative for back pain Neurological: Negative for headache All other ROS negative  ____________________________________________   PHYSICAL EXAM:  VITAL SIGNS: ED Triage Vitals  Enc Vitals Group     BP 04/18/17 1007 (!) 154/103     Pulse Rate 04/18/17 1007 99     Resp 04/18/17 1007 18     Temp 04/18/17 1007 97.8 F (36.6 C)     Temp Source  04/18/17 1007 Oral     SpO2 04/18/17 1007 96 %     Weight 04/18/17 1005 248 lb (112.5 kg)     Height 04/18/17 1005 5\' 6"  (1.676 m)     Head Circumference --      Peak Flow --      Pain Score 04/18/17 1048 10     Pain Loc --      Pain Edu? --      Excl. in GC? --     Constitutional: Alert and oriented. Well appearing and in no distress. Eyes: Normal exam ENT   Head: Normocephalic and atraumatic.   Mouth/Throat: Mucous membranes are moist. Cardiovascular: Normal rate, regular rhythm. No  murmur Respiratory: Normal respiratory effort without tachypnea nor retractions. Breath sounds are clear Gastrointestinal: Soft moderate moderate upper abdominal discomfort especially in the epigastrium. No CVA tenderness Musculoskeletal: Nontender with normal range of motion in all extremities.  Neurologic:  Normal speech and language. No gross focal neurologic deficits  Skin:  Skin is warm, dry and intact.  Psychiatric: Mood and affect are normal.   ____________________________________________    INITIAL IMPRESSION / ASSESSMENT AND PLAN / ED COURSE  Pertinent labs & imaging results that were available during my care of the patient were reviewed by me and considered in my medical decision making (see chart for details).  Patient presents to the emergency department for nausea vomiting up her abdominal discomfort. Patient's labs are largely within normal limits. Patient does have mild to moderate epigastric discomfort. Lipase is normal. LFTs are normal. We will treat with nausea medication, IV fluids and pain medication and continue to closely monitor. Urinalysis pending.  Patient's labs are largely within normal limits. Urine is pending. Patient has had 3 rounds of IV nausea medication continues to state significant nausea. Attempted to drink water and began dry heaving. Patient continues to epigastric discomfort, although she states that is somewhat improved after fentanyl and Toradol. Given the patient's intractable nausea and vomiting will admit to the hospital for further treatment  ____________________________________________   FINAL CLINICAL IMPRESSION(S) / ED DIAGNOSES  Gastritis Nausea and vomiting   Minna AntisPaduchowski, Jedidiah Demartini, MD 04/18/17 1434

## 2017-04-18 NOTE — ED Notes (Signed)
Pt given iv pain meds, fluids and something for n/v. Will reassess pain, lights dimmed per pt. Cont to monitor.

## 2017-04-19 ENCOUNTER — Encounter: Payer: Self-pay | Admitting: Radiology

## 2017-04-19 ENCOUNTER — Observation Stay: Payer: Medicaid Other

## 2017-04-19 DIAGNOSIS — Z833 Family history of diabetes mellitus: Secondary | ICD-10-CM | POA: Diagnosis not present

## 2017-04-19 DIAGNOSIS — I10 Essential (primary) hypertension: Secondary | ICD-10-CM | POA: Diagnosis present

## 2017-04-19 DIAGNOSIS — R109 Unspecified abdominal pain: Secondary | ICD-10-CM | POA: Diagnosis present

## 2017-04-19 DIAGNOSIS — Z791 Long term (current) use of non-steroidal anti-inflammatories (NSAID): Secondary | ICD-10-CM | POA: Diagnosis not present

## 2017-04-19 DIAGNOSIS — E876 Hypokalemia: Secondary | ICD-10-CM | POA: Diagnosis present

## 2017-04-19 DIAGNOSIS — A0472 Enterocolitis due to Clostridium difficile, not specified as recurrent: Secondary | ICD-10-CM | POA: Diagnosis present

## 2017-04-19 DIAGNOSIS — E871 Hypo-osmolality and hyponatremia: Secondary | ICD-10-CM | POA: Diagnosis present

## 2017-04-19 DIAGNOSIS — E860 Dehydration: Secondary | ICD-10-CM | POA: Diagnosis present

## 2017-04-19 DIAGNOSIS — Z6841 Body Mass Index (BMI) 40.0 and over, adult: Secondary | ICD-10-CM | POA: Diagnosis not present

## 2017-04-19 DIAGNOSIS — K222 Esophageal obstruction: Secondary | ICD-10-CM | POA: Diagnosis present

## 2017-04-19 DIAGNOSIS — K219 Gastro-esophageal reflux disease without esophagitis: Secondary | ICD-10-CM | POA: Diagnosis present

## 2017-04-19 DIAGNOSIS — K449 Diaphragmatic hernia without obstruction or gangrene: Secondary | ICD-10-CM | POA: Diagnosis present

## 2017-04-19 DIAGNOSIS — R112 Nausea with vomiting, unspecified: Secondary | ICD-10-CM | POA: Diagnosis not present

## 2017-04-19 DIAGNOSIS — Z8249 Family history of ischemic heart disease and other diseases of the circulatory system: Secondary | ICD-10-CM | POA: Diagnosis not present

## 2017-04-19 DIAGNOSIS — K2970 Gastritis, unspecified, without bleeding: Secondary | ICD-10-CM | POA: Diagnosis present

## 2017-04-19 LAB — CBC
HCT: 36.6 % (ref 35.0–47.0)
Hemoglobin: 12.1 g/dL (ref 12.0–16.0)
MCH: 26.2 pg (ref 26.0–34.0)
MCHC: 33.2 g/dL (ref 32.0–36.0)
MCV: 78.7 fL — ABNORMAL LOW (ref 80.0–100.0)
Platelets: 273 10*3/uL (ref 150–440)
RBC: 4.65 MIL/uL (ref 3.80–5.20)
RDW: 16.2 % — ABNORMAL HIGH (ref 11.5–14.5)
WBC: 9.3 10*3/uL (ref 3.6–11.0)

## 2017-04-19 LAB — BASIC METABOLIC PANEL
Anion gap: 6 (ref 5–15)
BUN: 8 mg/dL (ref 6–20)
CO2: 23 mmol/L (ref 22–32)
Calcium: 8.8 mg/dL — ABNORMAL LOW (ref 8.9–10.3)
Chloride: 105 mmol/L (ref 101–111)
Creatinine, Ser: 0.5 mg/dL (ref 0.44–1.00)
GFR calc Af Amer: >60 mL/min (ref >60)
GFR calc non Af Amer: >60 mL/min (ref >60)
Glucose, Bld: 109 mg/dL — ABNORMAL HIGH (ref 65–99)
Potassium: 3.2 mmol/L — ABNORMAL LOW (ref 3.5–5.1)
Sodium: 134 mmol/L — ABNORMAL LOW (ref 135–145)

## 2017-04-19 MED ORDER — HYDROMORPHONE HCL 1 MG/ML IJ SOLN
0.5000 mg | Freq: Once | INTRAMUSCULAR | Status: AC
  Administered 2017-04-19: 0.5 mg via INTRAVENOUS
  Filled 2017-04-19: qty 0.5

## 2017-04-19 MED ORDER — IOPAMIDOL (ISOVUE-300) INJECTION 61%: 30.0000 mL | Freq: Once | INTRAVENOUS | Status: DC | PRN

## 2017-04-19 MED ORDER — DICYCLOMINE HCL 10 MG/ML IM SOLN
20.0000 mg | Freq: Once | INTRAMUSCULAR | Status: AC
  Administered 2017-04-19: 05:00:00 20 mg via INTRAMUSCULAR
  Filled 2017-04-19: qty 2

## 2017-04-19 MED ORDER — PROMETHAZINE HCL 25 MG/ML IJ SOLN
12.5000 mg | Freq: Four times a day (QID) | INTRAMUSCULAR | Status: DC | PRN
  Administered 2017-04-19 – 2017-04-23 (×13): 12.5 mg via INTRAVENOUS
  Filled 2017-04-19 (×14): qty 1

## 2017-04-19 MED ORDER — IOPAMIDOL (ISOVUE-300) INJECTION 61%
100.0000 mL | Freq: Once | INTRAVENOUS | Status: AC | PRN
  Administered 2017-04-19: 100 mL via INTRAVENOUS

## 2017-04-19 MED ORDER — POTASSIUM CHLORIDE CRYS ER 20 MEQ PO TBCR
40.0000 meq | EXTENDED_RELEASE_TABLET | Freq: Once | ORAL | Status: AC
  Administered 2017-04-19: 40 meq via ORAL
  Filled 2017-04-19: qty 2

## 2017-04-19 NOTE — Progress Notes (Signed)
Patient continues to ask for IV pain medication.  Has not drank but 2 sips of her contrast all morning despite zofran given so she can tolerate.  Now stating she needs IV pain medication to be able to drink her contrast.  Made Dr. Juliene PinaMody aware and one time verbal order with read back given for 0.5 mg IV dilaudid. Orson Apeanielle Rhemi Balbach, RN

## 2017-04-19 NOTE — Progress Notes (Signed)
[facility] [place] NAME: [name]    MR#:  [id number]  DATE OF BIRTH:  06/17/1986  SUBJECTIVE:  Patient continues to have generalized abdominal pain and nausea and vomiting. She is unable to tolerate by mouth.  REVIEW OF SYSTEMS:    Review of Systems  Constitutional: Negative for fever, chills weight loss HENT: Negative for ear pain, nosebleeds, congestion, facial swelling, rhinorrhea, neck pain, neck stiffness and ear discharge.   Respiratory: Negative for cough, shortness of breath, wheezing  Cardiovascular: Negative for chest pain, palpitations and leg swelling.  Gastrointestinal: Positive for abdominal pain, nausea and vomiting as well as diarrhea Genitourinary: Negative for dysuria, urgency, frequency, hematuria Musculoskeletal: Negative for back pain or joint pain Neurological: Negative for dizziness, seizures, syncope, focal weakness,  numbness and headaches.  Hematological: Does not bruise/bleed easily.  Psychiatric/Behavioral: Negative for hallucinations, confusion, dysphoric mood    Tolerating Diet: no      DRUG ALLERGIES:   Allergies  Allergen Reactions  . Morphine And Related Other (See Comments)    "whole body feels aggravated"    VITALS:  Blood pressure 129/80, pulse 98, temperature 98.1 F (36.7 C), temperature source Oral, resp. rate 14, height '5\' 6"'  (1.676 m), weight 112.5 kg (248 lb), last menstrual period 03/30/2017, SpO2 96 %.  PHYSICAL EXAMINATION:  Constitutional: Obese. No distress. HENT: Normocephalic. Marland Kitchen Oropharynx is clear and moist.  Eyes: Conjunctivae and EOM are normal. PERRLA, no scleral icterus.  Neck: Normal ROM. Neck supple. No JVD. No tracheal deviation. CVS: RRR, S1/S2 +, no murmurs, no gallops, no carotid bruit.  Pulmonary: Effort and breath sounds normal, no stridor, rhonchi, wheezes, rales.  Abdominal: Soft. BS +,  no distension, tenderness, rebound or guarding.  Musculoskeletal:  Normal range of motion. No edema and no tenderness.  Neuro: Alert. CN 2-12 grossly intact. No focal deficits. Skin: Skin is warm and dry. No rash noted. Psychiatric: Normal mood and affect.      LABORATORY PANEL:   CBC  Recent Labs Lab 04/18/17 1016  WBC 10.3  HGB 12.6  HCT 37.7  PLT 263   ------------------------------------------------------------------------------------------------------------------  Chemistries   Recent Labs Lab 04/18/17 1016  NA 133*  K 4.9  CL 107  CO2 19*  GLUCOSE 134*  BUN 11  CREATININE 0.78  CALCIUM 9.2  AST 49*  ALT 41  ALKPHOS 63  BILITOT 0.7   ------------------------------------------------------------------------------------------------------------------  Cardiac Enzymes No results for input(s): TROPONINI in the last 168 hours. ------------------------------------------------------------------------------------------------------------------  RADIOLOGY:  No results found.   ASSESSMENT AND PLAN:   31 year old female with anxiety and gastritis who presents with generalized abdominal pain, nausea vomiting diarrhea.  1. Abdominal pain: I will order CT scan of the abdomen rule out colitis.  2. Nausea, vomiting and diarrhea: Could be related to colitis/enteritis Supportive care TMB fluids  3. Hyponatremia: BMP pending for a.m. Continue IV fluids  4. History of gastritis: Continue PPI  5. Essential hypertension: Continue lisinopril    Management plans discussed with the patient and she is in agreement.  CODE STATUS: full  TOTAL TIME TAKING CARE OF THIS PATIENT: 30 minutes.     POSSIBLE D/C 1-2 days, DEPENDING ON CLINICAL CONDITION.   Griffith Santilli M.D on 04/19/2017 at 7:18 AM  Between 7am to 6pm - Pager - [phone] After 6pm go to [email or web address] - password [email or web address] [facility]  Office  [phone]  CC: Primary care physician; Danelle Berry, NP  Note: This dictation  was prepared  with Dragon dictation along with smaller phrase technology. Any transcriptional errors that result from this process are unintentional.

## 2017-04-20 DIAGNOSIS — R112 Nausea with vomiting, unspecified: Secondary | ICD-10-CM

## 2017-04-20 DIAGNOSIS — Z791 Long term (current) use of non-steroidal anti-inflammatories (NSAID): Secondary | ICD-10-CM

## 2017-04-20 MED ORDER — HYDROMORPHONE HCL 1 MG/ML IJ SOLN
0.5000 mg | INTRAMUSCULAR | Status: DC | PRN
  Administered 2017-04-20 – 2017-04-23 (×15): 0.5 mg via INTRAVENOUS
  Filled 2017-04-20 (×15): qty 0.5

## 2017-04-20 MED ORDER — DIPHENHYDRAMINE HCL 50 MG/ML IJ SOLN
12.5000 mg | Freq: Three times a day (TID) | INTRAMUSCULAR | Status: DC | PRN
  Administered 2017-04-20 – 2017-04-23 (×6): 12.5 mg via INTRAVENOUS
  Filled 2017-04-20 (×6): qty 1

## 2017-04-20 NOTE — Consult Note (Addendum)
Wyline Mood MD, MRCP(U.K) [address]  [address]  [place], [place] [address]  Main: [phone]  Fax: [phone]  Consultation  Referring Provider:   Dr Juliene Pina  Primary Care Physician:  Fayrene Helper, NP Primary Gastroenterologist: None          Reason for Consultation:     Nausea  Date of Admission:  04/18/2017 Date of Consultation:  04/20/2017         HPI:   [name] is a 31 y.o. female presented to the ER on 04/18/17 with nausea and vomiting of 2 days duration along with some abdominal pain.  NSAID's yes for a few weeks prior almost daily BC powder and ibuprofen for tooth ache  Marijuana no  Sick contacts no  Diarrhea yes for a few days prior to admission which has since resolved  Fever no  Abdominal pain yes epigastric since prior to admission  Last episiode of vomiting this morning   CT abdomen showed no cute changes.   Past Medical History:  Diagnosis Date  . Anxiety   . Foot pain   . Gastritis 1308,6578  . GERD (gastroesophageal reflux disease)   . Heartburn anxiety  . Hypertension   . Seasonal allergies     Past Surgical History:  Procedure Laterality Date  . APPENDECTOMY N/A 05/25/2015   Procedure: APPENDECTOMY;  Surgeon: Manus Rudd, MD;  Location: [facility] OR;  Service: General;  Laterality: N/A;  . BREAST BIOPSY Right 05/03/13  . FOOT SURGERY    . WRIST SURGERY      Prior to Admission medications   Medication Sig Start Date End Date Taking? Authorizing Provider  fexofenadine (ALLEGRA) 180 MG tablet Take 180 mg by mouth daily as needed for allergies or rhinitis.   Yes [provider]  fluticasone (FLONASE) 50 MCG/ACT nasal spray Place 1-2 sprays into both nostrils daily. 03/01/17  Yes [provider]  ibuprofen (ADVIL,MOTRIN) 800 MG tablet Take 800 mg by mouth daily as needed for headache. 03/09/17  Yes [provider]  lisinopril (PRINIVIL,ZESTRIL) 20 MG tablet Take 20 mg by mouth daily.   Yes [provider]  omeprazole (PRILOSEC) 40 MG capsule Take 40 mg by mouth daily.   Yes [provider]  traZODone (DESYREL) 100 MG tablet Take 100 mg by mouth at bedtime as needed for sleep.   Yes [provider]    Family History  Problem Relation Age of Onset  . Hypertension Father   . Diabetes Father   . Liver cancer Paternal Grandfather   . Diabetes Maternal Grandmother   . Kidney failure Maternal Grandmother   . Heart failure Maternal Grandmother   . Stroke Maternal Grandmother   . Lung cancer Maternal Grandfather   . Stroke Paternal Grandmother   . Prostate cancer Neg Hx   . Bladder Cancer Neg Hx      Social History  Substance Use Topics  . Smoking status: Never Smoker  . Smokeless tobacco: Never Used  . Alcohol use Yes     Comment: occ    Allergies as of 04/18/2017 - Review Complete 04/18/2017  Allergen Reaction Noted  . Morphine and related Other (See Comments) 09/18/2011    Review of Systems:    All systems reviewed and negative except where noted in HPI.   Physical Exam:  Vital signs in last 24 hours: Temp:  [98 F (36.7 C)-99.1 F (37.3 C)] 98.4 F (36.9 C) (06/20 0751) Pulse Rate:  [89-102] 102 (06/20  1610) Resp:  [16-20] 18 (06/20 0751) BP: (137-156)/(70-99) 156/92 (06/20 0751) SpO2:  [98 %-100 %] 100 % (06/20 0751) Last BM Date: 04/18/17 General:   Pleasant, cooperative in NAD Head:  Normocephalic and atraumatic. Eyes:   No icterus.   Conjunctiva pink. PERRLA. Ears:  Normal auditory acuity. Neck:  Supple; no masses or thyroidomegaly Lungs: Respirations even and unlabored. Lungs clear to auscultation bilaterally.   No wheezes, crackles, or rhonchi.  Heart:  Regular rate and rhythm;  Without murmur, clicks, rubs or gallops Abdomen:  Soft, nondistended, mild epigastric tenderness Normal bowel sounds. No appreciable masses or hepatomegaly.  No rebound or guarding.  Rectal:  Not performed. Extremities:  Without edema, cyanosis or  clubbing. Neurologic:  Alert and oriented x3;  grossly normal neurologically. Skin:  Intact without significant lesions or rashes. Cervical Nodes:  No significant cervical adenopathy. Psych:  Alert and cooperative. Normal affect.  LAB RESULTS:  Recent Labs  04/18/17 1016 04/19/17 0714  WBC 10.3 9.3  HGB 12.6 12.1  HCT 37.7 36.6  PLT 263 273   BMET  Recent Labs  04/18/17 1016 04/19/17 0714  NA 133* 134*  K 4.9 3.2*  CL 107 105  CO2 19* 23  GLUCOSE 134* 109*  BUN 11 8  CREATININE 0.78 0.50  CALCIUM 9.2 8.8*   LFT  Recent Labs  04/18/17 1016  PROT 7.6  ALBUMIN 4.1  AST 49*  ALT 41  ALKPHOS 63  BILITOT 0.7   PT/INR No results for input(s): LABPROT, INR in the last 72 hours.  STUDIES: Ct Abdomen W Contrast  Result Date: 04/19/2017 CLINICAL DATA:  Three day history of left upper quadrant abdominal pain with nausea, vomiting and diarrhea. EXAM: CT ABDOMEN WITH CONTRAST TECHNIQUE: Multidetector CT imaging of the abdomen was performed using the standard protocol following bolus administration of intravenous contrast. CONTRAST:  ISOVUE-300 IOPAMIDOL (ISOVUE-300) INJECTION 61% COMPARISON:  02/10/2017 FINDINGS: Lower chest: The lung bases are clear of acute process. No pleural effusion or pulmonary lesions. The heart is normal in size. No pericardial effusion. The distal esophagus and aorta are unremarkable. Hepatobiliary: Diffuse fatty infiltration of the liver it is suspected. No focal hepatic lesions or intrahepatic biliary dilatation. The gallbladder is normal. No common bile duct dilatation. The portal and hepatic veins are patent. Pancreas: No mass, inflammation or ductal dilatation. Spleen: Normal size.  No focal lesions. Adrenals/Urinary Tract: The adrenal glands and kidneys are unremarkable and stable. No renal or obstructing ureteral calculi. No worrisome renal lesions or evidence of pyelonephritis. Stomach/Bowel: The stomach, duodenum, visualized small bowel and  visualize colon are unremarkable. No inflammatory changes, mass lesions or obstructive findings. Stable surgical changes involving the right colon. Vascular/Lymphatic: The aorta branch vessels are normal in stable. The major venous structures are patent. Small scattered mesenteric and retroperitoneal lymph nodes but no mass or adenopathy. Other: No ascites or abdominal wall hernia. Surgical midline incision is noted. Musculoskeletal: No significant bony findings. IMPRESSION: No acute abdominal findings, mass lesions or adenopathy. No findings to account the patient's left upper quadrant abdominal pain. No renal or upper ureteral calculi. No findings for pyelonephritis. Electronically Signed   By: Rudie Meyer M.D.   On: 04/19/2017 13:34      Impression / Plan:   [name] is a 31 y.o. y/o female with acute onset nausea,vomiting, abdominal pain. CT scan shows no acute findings. History very suggestive of an infectious gastroenteritis which usually resolves in 2-5 days with supportive therapy. She could also  have a peptic ulcer causing gastric outlet obstruction due to long term use of nsaids  Plan  1. IF has diarrhea check stool for c diff and pcr 2. IV fluids, PPI 3. If continues to have vomiting today then will do EGD tomorrow.  4. No NSAID's 5. Stool for H pylori antigen      Thank you for involving me in the care of this patient.      LOS: 1 day   Wyline MoodKiran Gopal Malter, MD  04/20/2017, 9:27 AM

## 2017-04-20 NOTE — Progress Notes (Signed)
Pt reports she just started her menstrual period today. Pads, mesh panties, and wipes provided. Pt reports abdominal pain is not related.   Pt updated GI consulted. Dilaudid pain medication added. Pt instructed she is now NPO until seen by GI. Pt verbalized understanding.

## 2017-04-20 NOTE — Progress Notes (Signed)
[facility] [place] [facility]   PATIENT NAME: Yvonne BellowBrittney [name]    MR#:  [id number]  DATE OF BIRTH:  1985/11/08  SUBJECTIVE:  Patient With persistent nausea, vomiting and abdominal pain. Describes lower abdominal pain which is cramping in nature. Patient unable to take in any by mouth's over the past 2 days.   REVIEW OF SYSTEMS:    Review of Systems  Constitutional: Negative for fever, chills weight loss HENT: Negative for ear pain, nosebleeds, congestion, facial swelling, rhinorrhea, neck pain, neck stiffness and ear discharge.   Respiratory: Negative for cough, shortness of breath, wheezing  Cardiovascular: Negative for chest pain, palpitations and leg swelling.  Gastrointestinal: Positive for abdominal pain, nausea and vomiting No diarrhea Genitourinary: Negative for dysuria, urgency, frequency, hematuria Musculoskeletal: Negative for back pain or joint pain Neurological: Negative for dizziness, seizures, syncope, focal weakness,  numbness and headaches.  Hematological: Does not bruise/bleed easily.  Psychiatric/Behavioral: Negative for hallucinations, confusion, dysphoric mood    Tolerating Diet: no      DRUG ALLERGIES:   Allergies  Allergen Reactions  . Morphine And Related Other (See Comments)    "whole body feels aggravated"    VITALS:  Blood pressure (!) 143/70, pulse 94, temperature 98.7 F (37.1 C), temperature source Oral, resp. rate 20, height 5\' 6"  (1.676 m), weight 112.5 kg (248 lb), last menstrual period 03/30/2017, SpO2 100 %.  PHYSICAL EXAMINATION:  Constitutional: Obese. No distress. HENT: Normocephalic. Marland Kitchen. Oropharynx is clear and moist.  Eyes: Conjunctivae and EOM are normal. PERRLA, no scleral icterus.  Neck: Normal ROM. Neck supple. No JVD. No tracheal deviation. CVS: RRR, S1/S2 +, no murmurs, no gallops, no carotid bruit.  Pulmonary: Effort and breath sounds normal, no stridor, rhonchi, wheezes, rales.  Abdominal: Soft. BS  +,  no distension, tenderness, rebound or guarding.  Musculoskeletal: Normal range of motion. No edema and no tenderness.  Neuro: Alert. CN 2-12 grossly intact. No focal deficits. Skin: Skin is warm and dry. No rash noted. Psychiatric: Normal mood and affect.      LABORATORY PANEL:   CBC  Recent Labs Lab 04/19/17 0714  WBC 9.3  HGB 12.1  HCT 36.6  PLT 273   ------------------------------------------------------------------------------------------------------------------  Chemistries   Recent Labs Lab 04/18/17 1016 04/19/17 0714  NA 133* 134*  K 4.9 3.2*  CL 107 105  CO2 19* 23  GLUCOSE 134* 109*  BUN 11 8  CREATININE 0.78 0.50  CALCIUM 9.2 8.8*  AST 49*  --   ALT 41  --   ALKPHOS 63  --   BILITOT 0.7  --    ------------------------------------------------------------------------------------------------------------------  Cardiac Enzymes No results for input(s): TROPONINI in the last 168 hours. ------------------------------------------------------------------------------------------------------------------  RADIOLOGY:  Ct Abdomen W Contrast  Result Date: 04/19/2017 CLINICAL DATA:  Three day history of left upper quadrant abdominal pain with nausea, vomiting and diarrhea. EXAM: CT ABDOMEN WITH CONTRAST TECHNIQUE: Multidetector CT imaging of the abdomen was performed using the standard protocol following bolus administration of intravenous contrast. CONTRAST:  100mL ISOVUE-300 IOPAMIDOL (ISOVUE-300) INJECTION 61% COMPARISON:  02/10/2017 FINDINGS: Lower chest: The lung bases are clear of acute process. No pleural effusion or pulmonary lesions. The heart is normal in size. No pericardial effusion. The distal esophagus and aorta are unremarkable. Hepatobiliary: Diffuse fatty infiltration of the liver it is suspected. No focal hepatic lesions or intrahepatic biliary dilatation. The gallbladder is normal. No common bile duct dilatation. The portal and hepatic veins are  patent. Pancreas: No mass, inflammation or  ductal dilatation. Spleen: Normal size.  No focal lesions. Adrenals/Urinary Tract: The adrenal glands and kidneys are unremarkable and stable. No renal or obstructing ureteral calculi. No worrisome renal lesions or evidence of pyelonephritis. Stomach/Bowel: The stomach, duodenum, visualized small bowel and visualize colon are unremarkable. No inflammatory changes, mass lesions or obstructive findings. Stable surgical changes involving the right colon. Vascular/Lymphatic: The aorta branch vessels are normal in stable. The major venous structures are patent. Small scattered mesenteric and retroperitoneal lymph nodes but no mass or adenopathy. Other: No ascites or abdominal wall hernia. Surgical midline incision is noted. Musculoskeletal: No significant bony findings. IMPRESSION: No acute abdominal findings, mass lesions or adenopathy. No findings to account the patient's left upper quadrant abdominal pain. No renal or upper ureteral calculi. No findings for pyelonephritis. Electronically Signed   By: Rudie Meyer M.D.   On: 04/19/2017 13:34     ASSESSMENT AND PLAN:   31 year old female with anxiety and gastritis who presents with generalized abdominal pain, nausea vomiting diarrhea.  1. Abdominal pain: CT scan is negative for acute pathology  Discussed case with GI this morning.  Patient needs EGD since she has a history of gastritis  Patient is nothing by mouth  Likely EGD tomorrow.   2. Nausea, vomiting and diarrhea: Plan as outlined above  3. Hyponatremia:Continue IV fluids  4. History of gastritis: Continue PPI  5. Essential hypertension: Continue lisinopril  Case discussed with Dr. Tobi Bastos  Management plans discussed with the patient and she is in agreement.  CODE STATUS: full  TOTAL TIME TAKING CARE OF THIS PATIENT: 24 minutes.     POSSIBLE D/C 1-2 days, DEPENDING ON CLINICAL CONDITION.   Mariha Sleeper M.D on 04/20/2017 at 7:33  AM  Between 7am to 6pm - Pager - [phone] After 6pm go to [email or web address] - Social research officer, government  [facility]  Office  [phone]  CC: Primary care physician; Fayrene Helper, NP  Note: This dictation was prepared with Dragon dictation along with smaller phrase technology. Any transcriptional errors that result from this process are unintentional.

## 2017-04-20 NOTE — Plan of Care (Signed)
Problem: Education: Goal: Knowledge of [facility] Education information/materials will improve Outcome: Progressing Hypertensive during shift, received PRN Hydralazine 10mg  x1, BP WDL at recheck.  VSS otherwise, free of falls.  Reports abd pain 9/10, reports unchanged w/ PRN Percocet 5-325mg .  Received PRN Percocet x2, PRN Phenergan 12.5 mg x2 and PRN Zofran 4mg  x1 for nausea.  Dr. Anne HahnWillis and Dr. Sheryle Hailiamond paged multiple times per pt request for IV Dilaudid, despite this RN reinforcing that MD does not want to order IV pain med d/t concern for worsening gastritis (per day shift RN).  No IV pain med ordered this shift.  Boyfriend at bedside, call bell within reach.  WCTM.

## 2017-04-20 NOTE — Progress Notes (Signed)
Pt reports vomiting x2. Nausea medicine given as ordered.  Requested pt to let staff see emesis if occurs again and if she has diarrhea we will take a sample. Pt reports no BM since before admission.

## 2017-04-21 ENCOUNTER — Encounter: Payer: Self-pay | Admitting: *Deleted

## 2017-04-21 ENCOUNTER — Inpatient Hospital Stay: Payer: Medicaid Other | Admitting: Anesthesiology

## 2017-04-21 ENCOUNTER — Encounter
Admission: EM | Disposition: A | Discharge status: Home / Self Care | Payer: Self-pay | Source: Home / Self Care | Attending: Internal Medicine

## 2017-04-21 HISTORY — PX: ESOPHAGOGASTRODUODENOSCOPY (EGD) WITH PROPOFOL: SHX5813

## 2017-04-21 LAB — BASIC METABOLIC PANEL
Anion gap: 4 — ABNORMAL LOW (ref 5–15)
BUN: 9 mg/dL (ref 6–20)
CO2: 26 mmol/L (ref 22–32)
Calcium: 9 mg/dL (ref 8.9–10.3)
Chloride: 103 mmol/L (ref 101–111)
Creatinine, Ser: 0.55 mg/dL (ref 0.44–1.00)
GFR calc Af Amer: >60 mL/min (ref >60)
GFR calc non Af Amer: >60 mL/min (ref >60)
Glucose, Bld: 82 mg/dL (ref 65–99)
Potassium: 3.2 mmol/L — ABNORMAL LOW (ref 3.5–5.1)
Sodium: 133 mmol/L — ABNORMAL LOW (ref 135–145)

## 2017-04-21 LAB — CBC
HCT: 38.6 % (ref 35.0–47.0)
Hemoglobin: 13.3 g/dL (ref 12.0–16.0)
MCH: 25.9 pg — ABNORMAL LOW (ref 26.0–34.0)
MCHC: 34.4 g/dL (ref 32.0–36.0)
MCV: 75.4 fL — ABNORMAL LOW (ref 80.0–100.0)
Platelets: 263 10*3/uL (ref 150–440)
RBC: 5.12 MIL/uL (ref 3.80–5.20)
RDW: 16.2 % — ABNORMAL HIGH (ref 11.5–14.5)
WBC: 8.5 10*3/uL (ref 3.6–11.0)

## 2017-04-21 LAB — MAGNESIUM: Magnesium: 2.1 mg/dL (ref 1.7–2.4)

## 2017-04-21 SURGERY — ESOPHAGOGASTRODUODENOSCOPY (EGD) WITH PROPOFOL
Anesthesia: General

## 2017-04-21 MED ORDER — LABETALOL HCL 5 MG/ML IV SOLN
INTRAVENOUS | Status: DC | PRN
  Administered 2017-04-21 (×2): 10 mg via INTRAVENOUS

## 2017-04-21 MED ORDER — LIDOCAINE HCL (PF) 2 % IJ SOLN
INTRAMUSCULAR | Status: AC
  Filled 2017-04-21: qty 2

## 2017-04-21 MED ORDER — FENTANYL CITRATE (PF) 100 MCG/2ML IJ SOLN
INTRAMUSCULAR | Status: DC | PRN
  Administered 2017-04-21: 25 ug via INTRAVENOUS

## 2017-04-21 MED ORDER — POTASSIUM CHLORIDE CRYS ER 20 MEQ PO TBCR
40.0000 meq | EXTENDED_RELEASE_TABLET | Freq: Once | ORAL | Status: AC
  Administered 2017-04-21: 16:00:00 40 meq via ORAL
  Filled 2017-04-21: qty 2

## 2017-04-21 MED ORDER — PROPOFOL 500 MG/50ML IV EMUL
INTRAVENOUS | Status: DC | PRN
  Administered 2017-04-21: 150 ug/kg/min via INTRAVENOUS

## 2017-04-21 MED ORDER — LABETALOL HCL 5 MG/ML IV SOLN
INTRAVENOUS | Status: AC
  Filled 2017-04-21: qty 4

## 2017-04-21 MED ORDER — PROPOFOL 10 MG/ML IV BOLUS
INTRAVENOUS | Status: DC | PRN
  Administered 2017-04-21: 60 mg via INTRAVENOUS

## 2017-04-21 MED ORDER — PROPOFOL 500 MG/50ML IV EMUL
INTRAVENOUS | Status: AC
  Filled 2017-04-21: qty 50

## 2017-04-21 MED ORDER — ONDANSETRON HCL 4 MG/2ML IJ SOLN
INTRAMUSCULAR | Status: AC
  Filled 2017-04-21: qty 2

## 2017-04-21 MED ORDER — LISINOPRIL 20 MG PO TABS
40.0000 mg | ORAL_TABLET | Freq: Every day | ORAL | Status: DC
  Administered 2017-04-21 – 2017-04-23 (×3): 40 mg via ORAL
  Filled 2017-04-21 (×3): qty 2

## 2017-04-21 MED ORDER — SODIUM CHLORIDE 0.9 % IV SOLN: INTRAVENOUS | Status: DC

## 2017-04-21 MED ORDER — FENTANYL CITRATE (PF) 100 MCG/2ML IJ SOLN
INTRAMUSCULAR | Status: AC
  Filled 2017-04-21: qty 2

## 2017-04-21 MED ORDER — LIDOCAINE HCL (CARDIAC) 20 MG/ML IV SOLN
INTRAVENOUS | Status: DC | PRN
  Administered 2017-04-21: 100 mg via INTRAVENOUS

## 2017-04-21 MED ORDER — MIDAZOLAM HCL 2 MG/2ML IJ SOLN
INTRAMUSCULAR | Status: DC | PRN
  Administered 2017-04-21: 2 mg via INTRAVENOUS

## 2017-04-21 MED ORDER — HYDRALAZINE HCL 20 MG/ML IJ SOLN
10.0000 mg | Freq: Once | INTRAMUSCULAR | Status: AC
  Administered 2017-04-21: 10 mg via INTRAVENOUS
  Filled 2017-04-21: qty 1

## 2017-04-21 MED ORDER — ONDANSETRON HCL 4 MG/2ML IJ SOLN
INTRAMUSCULAR | Status: DC | PRN
  Administered 2017-04-21: 4 mg via INTRAVENOUS

## 2017-04-21 MED ORDER — MIDAZOLAM HCL 2 MG/2ML IJ SOLN
INTRAMUSCULAR | Status: AC
  Filled 2017-04-21: qty 2

## 2017-04-21 NOTE — Anesthesia Preprocedure Evaluation (Signed)
Anesthesia Evaluation  Patient identified by MRN, date of birth, ID band Patient awake    Reviewed: Allergy & Precautions, NPO status , Patient's Chart, lab work & pertinent test results  History of Anesthesia Complications Negative for: history of anesthetic complications  Airway Mallampati: II  TM Distance: >3 FB Neck ROM: Full    Dental  (+) Teeth Intact, Dental Advisory Given   Pulmonary neg pulmonary ROS,    Pulmonary exam normal        Cardiovascular hypertension, Pt. on medications negative cardio ROS Normal cardiovascular exam     Neuro/Psych PSYCHIATRIC DISORDERS Anxiety negative neurological ROS     GI/Hepatic Neg liver ROS, GERD  Medicated,  Endo/Other  Morbid obesity  Renal/GU negative Renal ROS  negative genitourinary   Musculoskeletal negative musculoskeletal ROS (+)   Abdominal   Peds negative pediatric ROS (+)  Hematology negative hematology ROS (+)   Anesthesia Other Findings   Reproductive/Obstetrics                             Anesthesia Physical  Anesthesia Plan  ASA: II and emergent  Anesthesia Plan: General   Post-op Pain Management:    Induction: Intravenous  PONV Risk Score and Plan:   Airway Management Planned: Nasal Cannula  Additional Equipment:   Intra-op Plan:   Post-operative Plan:   Informed Consent: I have reviewed the patients History and Physical, chart, labs and discussed the procedure including the risks, benefits and alternatives for the proposed anesthesia with the patient or authorized representative who has indicated his/her understanding and acceptance.   Dental advisory given  Plan Discussed with: CRNA, Anesthesiologist and Surgeon  Anesthesia Plan Comments:         Anesthesia Quick Evaluation

## 2017-04-21 NOTE — H&P (Signed)
Wyline Mood MD [address], [address] [place], [place] [address] Phone: [phone] Fax : [phone]  Primary Care Physician:  Fayrene Helper, NP Primary Gastroenterologist:  Dr. Wyline Mood   Pre-Procedure History & Physical: HPI:  [name] is a 31 y.o. female is here for an endoscopy.   Past Medical History:  Diagnosis Date  . Anxiety   . Foot pain   . Gastritis 1308,6578  . GERD (gastroesophageal reflux disease)   . Heartburn anxiety  . Hypertension   . Seasonal allergies     Past Surgical History:  Procedure Laterality Date  . APPENDECTOMY N/A 05/25/2015   Procedure: APPENDECTOMY;  Surgeon: Manus Rudd, MD;  Location[facility];  Service: General;  Laterality: N/A;  . BREAST BIOPSY Right 05/03/13  . FOOT SURGERY    . WRIST SURGERY      Prior to Admission medications   Medication Sig Start Date End Date Taking? Authorizing Provider  fexofenadine (ALLEGRA) 180 MG tablet Take 180 mg by mouth daily as needed for allergies or rhinitis.   Yes [provider]  fluticasone (FLONASE) 50 MCG/ACT nasal spray Place 1-2 sprays into both nostrils daily. 03/01/17  Yes [provider]  ibuprofen (ADVIL,MOTRIN) 800 MG tablet Take 800 mg by mouth daily as needed for headache. 03/09/17  Yes [provider]  lisinopril (PRINIVIL,ZESTRIL) 20 MG tablet Take 20 mg by mouth daily.   Yes [provider]  omeprazole (PRILOSEC) 40 MG capsule Take 40 mg by mouth daily.   Yes [provider]  traZODone (DESYREL) 100 MG tablet Take 100 mg by mouth at bedtime as needed for sleep.   Yes [provider]    Allergies as of 04/18/2017 - Review Complete 04/18/2017  Allergen Reaction Noted  . Morphine and related Other (See Comments) 09/18/2011    Family History  Problem Relation Age of Onset  . Hypertension Father   . Diabetes Father   . Liver cancer Paternal Grandfather   . Diabetes Maternal Grandmother   . Kidney failure Maternal  Grandmother   . Heart failure Maternal Grandmother   . Stroke Maternal Grandmother   . Lung cancer Maternal Grandfather   . Stroke Paternal Grandmother   . Prostate cancer Neg Hx   . Bladder Cancer Neg Hx     Social History   Social History  . Marital status: Single    Spouse name: N/A  . Number of children: N/A  . Years of education: N/A   Occupational History  . home maker    Social History Main Topics  . Smoking status: Never Smoker  . Smokeless tobacco: Never Used  . Alcohol use Yes     Comment: occ  . Drug use: No  . Sexual activity: Yes    Birth control/ protection: Condom   Other Topics Concern  . Not on file   Social History Narrative  . No narrative on file    Review of Systems: See HPI, otherwise negative ROS  Physical Exam: BP (!) 157/98 (BP Location: Left Arm)   Pulse (!) 104   Temp 98.7 F (37.1 C)   Resp 20   Ht 5\' 6"  (1.676 m)   Wt 248 lb (112.5 kg)   LMP 03/30/2017 (Exact Date)   SpO2 100%   BMI 40.03 kg/m  General:   Alert,  pleasant and cooperative in NAD Head:  Normocephalic and atraumatic. Neck:  Supple; no masses or thyromegaly. Lungs:  Clear throughout to auscultation.  Heart:  Regular rate and rhythm. Abdomen:  Soft, nontender and nondistended. Normal bowel sounds, without guarding, and without rebound.   Neurologic:  Alert and  oriented x4;  grossly normal neurologically.  Impression/Plan: [name] is here for an endoscopy to be performed for persistent nausea, vomiting   Risks, benefits, limitations, and alternatives regarding  endoscopy have been reviewed with the patient.  Questions have been answered.  All parties agreeable.   Wyline MoodKiran Caprisha Bridgett, MD  04/21/2017, 8:12 AM

## 2017-04-21 NOTE — Anesthesia Post-op Follow-up Note (Cosign Needed)
Anesthesia QCDR form completed.        

## 2017-04-21 NOTE — Progress Notes (Signed)
Pt leaving floor for egd consent signed.  Pt on period.  ivfs infusing

## 2017-04-21 NOTE — Op Note (Signed)
[facility] Gastroenterology Patient Name: [name] Procedure Date: 04/21/2017 9:12 AM MRN: [id number] Account #: [id number] Date of Birth: 03-05-1986 Admit Type: Inpatient Age: 31 Room: [facility] ENDO ROOM 4 Gender: Female Note Status: Finalized Procedure:            Upper GI endoscopy Indications:          Nausea with vomiting Providers:            Wyline Mood MD, MD Referring MD:         Resa Miner. Marcelle Overlie (Referring MD) Medicines:            Monitored Anesthesia Care Complications:        No immediate complications. Procedure:            Pre-Anesthesia Assessment:                       - Prior to the procedure, a History and Physical was                        performed, and patient medications, allergies and                        sensitivities were reviewed. The patient's tolerance of                        previous anesthesia was reviewed.                       - The risks and benefits of the procedure and the                        sedation options and risks were discussed with the                        patient. All questions were answered and informed                        consent was obtained.                       - ASA Grade Assessment: II - A patient with mild                        systemic disease.                       After obtaining informed consent, the endoscope was                        passed under direct vision. Throughout the procedure,                        the patient's blood pressure, pulse, and oxygen                        saturations were monitored continuously. The Endoscope                        was introduced through the mouth, and advanced to the  third part of duodenum. The upper GI endoscopy was                        accomplished with ease. The patient tolerated the                        procedure well. Findings:      The esophagus was normal.      The examined duodenum was normal.      A 5 cm  hiatal hernia was present.      A mild Schatzki ring (acquired) was found at the gastroesophageal       junction. Impression:           - Normal esophagus.                       - Normal examined duodenum.                       - 5 cm hiatal hernia.                       - Mild Schatzki ring.                       - No specimens collected. Recommendation:       - Return patient to hospital ward for ongoing care.                       - Advance diet as tolerated today.                       - 1. continue PPI                       2. Consider Reglan if nausea and vomiting no better                       3. If still persists suggest HIDA scan to evaluate gall                        bladder Procedure Code(s):    --- Professional ---                       [phone], Esophagogastroduodenoscopy, flexible, transoral;                        diagnostic, including collection of specimen(s) by                        brushing or washing, when performed (separate procedure) Diagnosis Code(s):    --- Professional ---                       K44.9, Diaphragmatic hernia without obstruction or                        gangrene                       K22.2, Esophageal obstruction                       R11.2, Nausea with vomiting, unspecified  CPT copyright 2016 American Medical Association. All rights reserved. The codes documented in this report are preliminary and upon coder review may  be revised to meet current compliance requirements. Wyline Mood, MD Wyline Mood MD, MD 04/21/2017 9:27:00 AM This report has been signed electronically. Number of Addenda: 0 Note Initiated On: 04/21/2017 9:12 AM      [facility]

## 2017-04-21 NOTE — Progress Notes (Signed)
[employer] - [place] at [facility]   PATIENT NAME: [name]    MR#:  [id number]  DATE OF BIRTH:  April 13, 1986  SUBJECTIVE:  Patient went for EGD this am Still with nausea   REVIEW OF SYSTEMS:    Review of Systems  Constitutional: Negative for fever, chills weight loss HENT: Negative for ear pain, nosebleeds, congestion, facial swelling, rhinorrhea, neck pain, neck stiffness and ear discharge.   Respiratory: Negative for cough, shortness of breath, wheezing  Cardiovascular: Negative for chest pain, palpitations and leg swelling.  Gastrointestinal: Positive for abdominal pain, nausea and vomiting No diarrhea Genitourinary: Negative for dysuria, urgency, frequency, hematuria Musculoskeletal: Negative for back pain or joint pain Neurological: Negative for dizziness, seizures, syncope, focal weakness,  numbness and headaches.  Hematological: Does not bruise/bleed easily.  Psychiatric/Behavioral: Negative for hallucinations, confusion, dysphoric mood    Tolerating Diet: no      DRUG ALLERGIES:   Allergies  Allergen Reactions  . Morphine And Related Other (See Comments)    "whole body feels aggravated"    VITALS:  Blood pressure (!) 162/93, pulse 90, temperature 98.7 F (37.1 C), temperature source Oral, resp. rate 20, height 5\' 6"  (1.676 m), weight 112.5 kg (248 lb), last menstrual period 03/30/2017, SpO2 100 %.  PHYSICAL EXAMINATION:  Constitutional: Obese. No distress. HENT: Normocephalic. Marland Kitchen Oropharynx is clear and moist.  Eyes: Conjunctivae and EOM are normal. PERRLA, no scleral icterus.  Neck: Normal ROM. Neck supple. No JVD. No tracheal deviation. CVS: RRR, S1/S2 +, no murmurs, no gallops, no carotid bruit.  Pulmonary: Effort and breath sounds normal, no stridor, rhonchi, wheezes, rales.  Abdominal: Soft. BS +,  no distension, tenderness, rebound or guarding.  Musculoskeletal: Normal range of motion. No edema and no tenderness.  Neuro:  Alert. CN 2-12 grossly intact. No focal deficits. Skin: Skin is warm and dry. No rash noted. Psychiatric: Normal mood and affect.      LABORATORY PANEL:   CBC  Recent Labs Lab 04/21/17 0337  WBC 8.5  HGB 13.3  HCT 38.6  PLT 263   ------------------------------------------------------------------------------------------------------------------  Chemistries   Recent Labs Lab 04/18/17 1016  04/21/17 0337  NA 133*  < > 133*  K 4.9  < > 3.2*  CL 107  < > 103  CO2 19*  < > 26  GLUCOSE 134*  < > 82  BUN 11  < > 9  CREATININE 0.78  < > 0.55  CALCIUM 9.2  < > 9.0  AST 49*  --   --   ALT 41  --   --   ALKPHOS 63  --   --   BILITOT 0.7  --   --   < > = values in this interval not displayed. ------------------------------------------------------------------------------------------------------------------  Cardiac Enzymes No results for input(s): TROPONINI in the last 168 hours. ------------------------------------------------------------------------------------------------------------------  RADIOLOGY:  Ct Abdomen W Contrast  Result Date: 04/19/2017 CLINICAL DATA:  Three day history of left upper quadrant abdominal pain with nausea, vomiting and diarrhea. EXAM: CT ABDOMEN WITH CONTRAST TECHNIQUE: Multidetector CT imaging of the abdomen was performed using the standard protocol following bolus administration of intravenous contrast. CONTRAST:  ISOVUE-300 IOPAMIDOL (ISOVUE-300) INJECTION 61% COMPARISON:  02/10/2017 FINDINGS: Lower chest: The lung bases are clear of acute process. No pleural effusion or pulmonary lesions. The heart is normal in size. No pericardial effusion. The distal esophagus and aorta are unremarkable. Hepatobiliary: Diffuse fatty infiltration of the liver it is suspected. No focal hepatic lesions or intrahepatic  biliary dilatation. The gallbladder is normal. No common bile duct dilatation. The portal and hepatic veins are patent. Pancreas: No mass,  inflammation or ductal dilatation. Spleen: Normal size.  No focal lesions. Adrenals/Urinary Tract: The adrenal glands and kidneys are unremarkable and stable. No renal or obstructing ureteral calculi. No worrisome renal lesions or evidence of pyelonephritis. Stomach/Bowel: The stomach, duodenum, visualized small bowel and visualize colon are unremarkable. No inflammatory changes, mass lesions or obstructive findings. Stable surgical changes involving the right colon. Vascular/Lymphatic: The aorta branch vessels are normal in stable. The major venous structures are patent. Small scattered mesenteric and retroperitoneal lymph nodes but no mass or adenopathy. Other: No ascites or abdominal wall hernia. Surgical midline incision is noted. Musculoskeletal: No significant bony findings. IMPRESSION: No acute abdominal findings, mass lesions or adenopathy. No findings to account the patient's left upper quadrant abdominal pain. No renal or upper ureteral calculi. No findings for pyelonephritis. Electronically Signed   By: Rudie MeyerP.  Gallerani M.D.   On: 04/19/2017 13:34     ASSESSMENT AND PLAN:   31 year old female with anxiety and gastritis who presents with generalized abdominal pain, nausea vomiting diarrhea.  1. Abdominal pain: CT scan is negative for acute pathology  Patient underwent EGD which was basically normal I will order right upper card ultrasound and HIDA scan if needed  2. Nausea, vomiting Continue supportive care and IV fluids 3. Hyponatremia:Continue IV fluids  4. History of gastritis: Continue PPI  5. Essential hypertension: Continue lisinopril 6. Hypokalemia: This will be repleted and recheck in a.m. Case discussed with Dr. Tobi BastosAnna  Management plans discussed with the patient and she is in agreement.  CODE STATUS: full  TOTAL TIME TAKING CARE OF THIS PATIENT: 24 minutes.     POSSIBLE D/C 1-2 days, DEPENDING ON CLINICAL CONDITION.   [name] M.D on 04/21/2017 at 11:13 AM  Between  7am to 6pm - Pager - [phone] After 6pm go to [email or web address] - Social research officer, governmentpassword [email or web address] [email or web address]  [facility] [place] [facility]  Office  [phone]  CC: Primary care physician; Fayrene HelperBoswell, Chelsa H, NP  Note: This dictation was prepared with Dragon dictation along with smaller phrase technology. Any transcriptional errors that result from this process are unintentional.

## 2017-04-21 NOTE — Plan of Care (Signed)
Problem: Education: Goal: Knowledge of  General Education information/materials will improve Outcome: Progressing Pt went for egd this am. hiatial hernia found.  For hep bil scan in am 0830  Problem: Pain Managment: Goal: General experience of comfort will improve Outcome: Not Progressing Pt cont to c/o  abd pain. 9/10 usually.  No vomiting noted.  Cont to c/o nausea and antiemetic given.   Problem: Fluid Volume: Goal: Ability to maintain a balanced intake and output will improve Outcome: Progressing Ifs cont

## 2017-04-21 NOTE — Transfer of Care (Signed)
Immediate Anesthesia Transfer of Care Note  Patient: [name]  Procedure(s) Performed: Procedure(s): ESOPHAGOGASTRODUODENOSCOPY (EGD) WITH PROPOFOL (N/A)  Patient Location: Endoscopy Unit  Anesthesia Type:General  Level of Consciousness: awake, alert , oriented and patient cooperative  Airway & Oxygen Therapy: Patient Spontanous Breathing and Patient connected to nasal cannula oxygen  Post-op Assessment: Report given to RN, Post -op Vital signs reviewed and stable and Patient moving all extremities X 4  Post vital signs: Reviewed and stable  Last Vitals:  Vitals:   04/21/17 0417 04/21/17 0819  BP:  (!) 161/100  Pulse:  95  Resp:  20  Temp: 37.1 C 36.8 C    Last Pain:  Vitals:   04/21/17 0800  TempSrc:   PainSc: 8       Patients Stated Pain Goal: 1 (04/21/17 0440)  Complications: No apparent anesthesia complications

## 2017-04-21 NOTE — Progress Notes (Signed)
Wyline Mood MD, MRCP(U.K) [address]  [address]  [place], [place] [address]  Main: [phone]    [name] is being followed for nausea/vomiting  Day 2 of follow up   Subjective: Still vomiting and has nausea    Objective: Vital signs in last 24 hours: Vitals:   04/20/17 1335 04/20/17 2126 04/21/17 0417 04/21/17 0417  BP: (!) 156/92 (!) 161/94 (!) 157/98   Pulse: 93 85 (!) 104   Resp:  20 20   Temp: 98.4 F (36.9 C) 99.3 F (37.4 C)  98.7 F (37.1 C)  TempSrc: Oral Oral    SpO2: 99% 99% 100%   Weight:      Height:       Weight change:   Intake/Output Summary (Last 24 hours) at 04/21/17 0732 Last data filed at 04/21/17 0122  Gross per 24 hour  Intake          2154.17 ml  Output                0 ml  Net          2154.17 ml     Exam: Heart:: Regular rate and rhythm, S1S2 present or without murmur or extra heart sounds Lungs: normal, clear to auscultation and clear to auscultation and percussion Abdomen: soft, nontender, normal bowel sounds   Lab Results: @LABTEST2 @ Micro Results: No results found for this or any previous visit (from the past 240 hour(s)). Studies/Results: Ct Abdomen W Contrast  Result Date: 04/19/2017 CLINICAL DATA:  Three day history of left upper quadrant abdominal pain with nausea, vomiting and diarrhea. EXAM: CT ABDOMEN WITH CONTRAST TECHNIQUE: Multidetector CT imaging of the abdomen was performed using the standard protocol following bolus administration of intravenous contrast. CONTRAST:  ISOVUE-300 IOPAMIDOL (ISOVUE-300) INJECTION 61% COMPARISON:  02/10/2017 FINDINGS: Lower chest: The lung bases are clear of acute process. No pleural effusion or pulmonary lesions. The heart is normal in size. No pericardial effusion. The distal esophagus and aorta are unremarkable. Hepatobiliary: Diffuse fatty infiltration of the liver it is suspected. No focal hepatic lesions or intrahepatic biliary dilatation. The gallbladder is  normal. No common bile duct dilatation. The portal and hepatic veins are patent. Pancreas: No mass, inflammation or ductal dilatation. Spleen: Normal size.  No focal lesions. Adrenals/Urinary Tract: The adrenal glands and kidneys are unremarkable and stable. No renal or obstructing ureteral calculi. No worrisome renal lesions or evidence of pyelonephritis. Stomach/Bowel: The stomach, duodenum, visualized small bowel and visualize colon are unremarkable. No inflammatory changes, mass lesions or obstructive findings. Stable surgical changes involving the right colon. Vascular/Lymphatic: The aorta branch vessels are normal in stable. The major venous structures are patent. Small scattered mesenteric and retroperitoneal lymph nodes but no mass or adenopathy. Other: No ascites or abdominal wall hernia. Surgical midline incision is noted. Musculoskeletal: No significant bony findings. IMPRESSION: No acute abdominal findings, mass lesions or adenopathy. No findings to account the patient's left upper quadrant abdominal pain. No renal or upper ureteral calculi. No findings for pyelonephritis. Electronically Signed   By: Rudie Meyer M.D.   On: 04/19/2017 13:34   Medications: I have reviewed the patient's current medications. Scheduled Meds: . enoxaparin (LOVENOX) injection  40 mg Subcutaneous Q12H  . lisinopril  20 mg Oral Daily  . pantoprazole  40 mg Oral Daily   Continuous Infusions: . sodium chloride 125 mL/hr at 04/21/17 0122   PRN Meds:.acetaminophen **OR** acetaminophen, diphenhydrAMINE, fluticasone, hydrALAZINE, HYDROmorphone (DILAUDID) injection, iopamidol, ondansetron **OR**  ondansetron (ZOFRAN) IV, oxyCODONE-acetaminophen, promethazine, senna-docusate, traZODone   Assessment: Active Problems:   Gastritis   Abdominal pain  [name] is a 31 y.o. y/o female with acute onset nausea,vomiting, abdominal pain. CT scan shows no acute findings. History very suggestive of an infectious  gastroenteritis which usually resolves with supportive therapy. She could also have a peptic ulcer causing gastric outlet obstruction due to long term use of nsaids. Since she is no better since admission will proceed with EGD today .   Plan  1. IF has diarrhea check stool for c diff and pcr 2. IV fluids, PPI 3. EGD today and if negative will pursue gall bladder evaluation  4. No NSAID's 5. F/u H pylori antigen   I have discussed alternative options, risks & benefits,  which include, but are not limited to, bleeding, infection, perforation,respiratory complication & drug reaction.  The patient agrees with this plan & written consent will be obtained.     LOS: 2 days   [name] 04/21/2017, 7:32 AM

## 2017-04-21 NOTE — Progress Notes (Signed)
B/P 159/98. Ok'd with anesthia for transfer back to floor.

## 2017-04-21 NOTE — Progress Notes (Signed)
Dr. Carroll adm.Noralyn Pick Metoprolol 5mg  IV for elevated B/P.

## 2017-04-22 ENCOUNTER — Inpatient Hospital Stay: Payer: Medicaid Other

## 2017-04-22 ENCOUNTER — Other Ambulatory Visit: Payer: Self-pay | Admitting: Radiology

## 2017-04-22 LAB — BASIC METABOLIC PANEL
Anion gap: 8 (ref 5–15)
BUN: 9 mg/dL (ref 6–20)
CO2: 24 mmol/L (ref 22–32)
Calcium: 9 mg/dL (ref 8.9–10.3)
Chloride: 101 mmol/L (ref 101–111)
Creatinine, Ser: 0.65 mg/dL (ref 0.44–1.00)
GFR calc Af Amer: >60 mL/min (ref >60)
GFR calc non Af Amer: >60 mL/min (ref >60)
Glucose, Bld: 88 mg/dL (ref 65–99)
Potassium: 3.4 mmol/L — ABNORMAL LOW (ref 3.5–5.1)
Sodium: 133 mmol/L — ABNORMAL LOW (ref 135–145)

## 2017-04-22 MED ORDER — TECHNETIUM TC 99M MEBROFENIN IV KIT
5.0000 | PACK | Freq: Once | INTRAVENOUS | Status: AC | PRN
  Administered 2017-04-22: 5.47 via INTRAVENOUS

## 2017-04-22 MED ORDER — POTASSIUM CHLORIDE CRYS ER 20 MEQ PO TBCR
40.0000 meq | EXTENDED_RELEASE_TABLET | Freq: Once | ORAL | Status: AC
  Administered 2017-04-22: 40 meq via ORAL
  Filled 2017-04-22: qty 2

## 2017-04-22 MED ORDER — ONDANSETRON HCL 4 MG/2ML IJ SOLN: 4.0000 mg | Freq: Once | INTRAMUSCULAR | Status: DC | PRN

## 2017-04-22 MED ORDER — KETOROLAC TROMETHAMINE 30 MG/ML IJ SOLN
30.0000 mg | Freq: Four times a day (QID) | INTRAMUSCULAR | Status: DC | PRN
  Administered 2017-04-22: 10:00:00 30 mg via INTRAVENOUS
  Filled 2017-04-22 (×2): qty 1

## 2017-04-22 MED ORDER — FENTANYL CITRATE (PF) 100 MCG/2ML IJ SOLN: 25.0000 ug | INTRAMUSCULAR | Status: DC | PRN

## 2017-04-22 NOTE — Progress Notes (Signed)
[employer] - [place] at [facility]   PATIENT NAME: Yvonne BellowBrittney [name]    MR#:  [id number]  DATE OF BIRTH:  August 18, 1986  SUBJECTIVE:  Patient seen in nuclear med this am Still with nausea Not tolerating any orla intake  REVIEW OF SYSTEMS:    Review of Systems  Constitutional: Negative for fever, chills weight loss HENT: Negative for ear pain, nosebleeds, congestion, facial swelling, rhinorrhea, neck pain, neck stiffness and ear discharge.   Respiratory: Negative for cough, shortness of breath, wheezing  Cardiovascular: Negative for chest pain, palpitations and leg swelling.  Gastrointestinal: Positive for abdominal pain, nausea and vomiting No diarrhea Genitourinary: Negative for dysuria, urgency, frequency, hematuria Musculoskeletal: Negative for back pain or joint pain Neurological: Negative for dizziness, seizures, syncope, focal weakness,  numbness and headaches.  Hematological: Does not bruise/bleed easily.  Psychiatric/Behavioral: Negative for hallucinations, confusion, dysphoric mood    Tolerating Diet: no      DRUG ALLERGIES:   Allergies  Allergen Reactions  . Morphine And Related Other (See Comments)    "whole body feels aggravated"    VITALS:  Blood pressure (!) 149/93, pulse 97, temperature 98 F (36.7 C), temperature source Oral, resp. rate 20, height 5\' 6"  (1.676 m), weight 112.5 kg (248 lb), last menstrual period 03/30/2017, SpO2 99 %.  PHYSICAL EXAMINATION:  Constitutional: Obese. No distress. HENT: Normocephalic. Marland Kitchen. Oropharynx is clear and moist.  Eyes: Conjunctivae and EOM are normal. PERRLA, no scleral icterus.  Neck: Normal ROM. Neck supple. No JVD. No tracheal deviation. CVS: RRR, S1/S2 +, no murmurs, no gallops, no carotid bruit.  Pulmonary: Effort and breath sounds normal, no stridor, rhonchi, wheezes, rales.  Abdominal: Soft. BS +,  no distension, tenderness, rebound or guarding.  Musculoskeletal: Normal range of motion. No edema  and no tenderness.  Neuro: Alert. CN 2-12 grossly intact. No focal deficits. Skin: Skin is warm and dry. No rash noted. Psychiatric: Normal mood and affect.      LABORATORY PANEL:   CBC  Recent Labs Lab 04/21/17 0337  WBC 8.5  HGB 13.3  HCT 38.6  PLT 263   ------------------------------------------------------------------------------------------------------------------  Chemistries   Recent Labs Lab 04/18/17 1016  04/21/17 0337 04/22/17 0418  NA 133*  < > 133* 133*  K 4.9  < > 3.2* 3.4*  CL 107  < > 103 101  CO2 19*  < > 26 24  GLUCOSE 134*  < > 82 88  BUN 11  < > 9 9  CREATININE 0.78  < > 0.55 0.65  CALCIUM 9.2  < > 9.0 9.0  MG  --   --  2.1  --   AST 49*  --   --   --   ALT 41  --   --   --   ALKPHOS 63  --   --   --   BILITOT 0.7  --   --   --   < > = values in this interval not displayed. ------------------------------------------------------------------------------------------------------------------  Cardiac Enzymes No results for input(s): TROPONINI in the last 168 hours. ------------------------------------------------------------------------------------------------------------------  RADIOLOGY:  No results found.   ASSESSMENT AND PLAN:   31 year old female with anxiety and gastritis who presents with generalized abdominal pain, nausea vomiting diarrhea.  1. Abdominal pain: CT scan is negative for acute pathology  Patient underwent EGD which was basically normal Follow up with HIDA   2. Nausea, vomiting Continue supportive care and IV fluids 3. Hyponatremia:Continue IV fluids  4. History of gastritis: Continue PPI  5. Essential hypertension: Continue lisinopril 6. Hypokalemia: Replete PRN   Management plans discussed with the patient and she is in agreement.  CODE STATUS: full  TOTAL TIME TAKING CARE OF THIS PATIENT: 24 minutes.     POSSIBLE D/C 1-2 days, DEPENDING ON CLINICAL CONDITION.   Sandford Diop M.D on 04/22/2017 at 11:37  AM  Between 7am to 6pm - Pager - [phone] After 6pm go to [email or web address] - Social research officer, government  [facility]  Office  [phone]  CC: Primary care physician; Fayrene Helper, NP  Note: This dictation was prepared with Dragon dictation along with smaller phrase technology. Any transcriptional errors that result from this process are unintentional.

## 2017-04-23 DIAGNOSIS — E876 Hypokalemia: Secondary | ICD-10-CM

## 2017-04-23 DIAGNOSIS — R112 Nausea with vomiting, unspecified: Secondary | ICD-10-CM

## 2017-04-23 DIAGNOSIS — A0472 Enterocolitis due to Clostridium difficile, not specified as recurrent: Secondary | ICD-10-CM

## 2017-04-23 DIAGNOSIS — E871 Hypo-osmolality and hyponatremia: Secondary | ICD-10-CM

## 2017-04-23 DIAGNOSIS — E860 Dehydration: Secondary | ICD-10-CM

## 2017-04-23 LAB — BASIC METABOLIC PANEL
Anion gap: 4 — ABNORMAL LOW (ref 5–15)
BUN: 15 mg/dL (ref 6–20)
CO2: 26 mmol/L (ref 22–32)
Calcium: 8.7 mg/dL — ABNORMAL LOW (ref 8.9–10.3)
Chloride: 107 mmol/L (ref 101–111)
Creatinine, Ser: 0.8 mg/dL (ref 0.44–1.00)
GFR calc Af Amer: >60 mL/min (ref >60)
GFR calc non Af Amer: >60 mL/min (ref >60)
Glucose, Bld: 90 mg/dL (ref 65–99)
Potassium: 3.4 mmol/L — ABNORMAL LOW (ref 3.5–5.1)
Sodium: 137 mmol/L (ref 135–145)

## 2017-04-23 LAB — C DIFFICILE QUICK SCREEN W PCR REFLEX
C Diff antigen: POSITIVE — AB
C Diff toxin: NEGATIVE

## 2017-04-23 LAB — CLOSTRIDIUM DIFFICILE BY PCR: Toxigenic C. Difficile by PCR: POSITIVE — AB

## 2017-04-23 MED ORDER — VANCOMYCIN 50 MG/ML ORAL SOLUTION: 125.0000 mg | Freq: Four times a day (QID) | ORAL | 0 refills | Status: DC

## 2017-04-23 MED ORDER — RISAQUAD PO CAPS
1.0000 | ORAL_CAPSULE | Freq: Every day | ORAL | Status: DC
  Administered 2017-04-23: 1 via ORAL
  Filled 2017-04-23: qty 1

## 2017-04-23 MED ORDER — POTASSIUM CHLORIDE IN NACL 20-0.9 MEQ/L-% IV SOLN
INTRAVENOUS | Status: DC
  Administered 2017-04-23: 09:00:00 via INTRAVENOUS
  Filled 2017-04-23 (×4): qty 1000

## 2017-04-23 MED ORDER — VANCOMYCIN 50 MG/ML ORAL SOLUTION
125.0000 mg | Freq: Four times a day (QID) | ORAL | Status: DC
  Administered 2017-04-23 (×2): 125 mg via ORAL
  Filled 2017-04-23 (×4): qty 2.5

## 2017-04-23 MED ORDER — METRONIDAZOLE 500 MG PO TABS: 500.0000 mg | ORAL_TABLET | Freq: Three times a day (TID) | ORAL | 0 refills | Status: DC

## 2017-04-23 MED ORDER — RISAQUAD PO CAPS: 1.0000 | ORAL_CAPSULE | Freq: Every day | ORAL | 3 refills | Status: DC

## 2017-04-23 MED ORDER — OXYCODONE-ACETAMINOPHEN 5-325 MG PO TABS: 1.0000 | ORAL_TABLET | Freq: Four times a day (QID) | ORAL | 0 refills | Status: DC | PRN

## 2017-04-23 MED ORDER — METRONIDAZOLE 500 MG PO TABS
500.0000 mg | ORAL_TABLET | Freq: Three times a day (TID) | ORAL | Status: DC
  Administered 2017-04-23: 05:00:00 500 mg via ORAL
  Filled 2017-04-23: qty 1

## 2017-04-23 MED ORDER — DIPHENHYDRAMINE HCL 25 MG PO CAPS: 25.0000 mg | ORAL_CAPSULE | Freq: Three times a day (TID) | ORAL | Status: DC | PRN

## 2017-04-23 MED ORDER — VANCOMYCIN HCL 125 MG PO CAPS: 125.0000 mg | ORAL_CAPSULE | Freq: Four times a day (QID) | ORAL | 1 refills | Status: DC

## 2017-04-23 MED ORDER — ONDANSETRON HCL 4 MG PO TABS: 4.0000 mg | ORAL_TABLET | Freq: Four times a day (QID) | ORAL | 0 refills | Status: DC | PRN

## 2017-04-23 MED ORDER — POTASSIUM CHLORIDE CRYS ER 20 MEQ PO TBCR
40.0000 meq | EXTENDED_RELEASE_TABLET | Freq: Once | ORAL | Status: AC
  Administered 2017-04-23: 40 meq via ORAL
  Filled 2017-04-23: qty 2

## 2017-04-23 NOTE — Plan of Care (Signed)
Problem: Fluid Volume: Goal: Ability to maintain a balanced intake and output will improve Outcome: Progressing PRN nausea medication required per pt request; good PO intake at meal times

## 2017-04-23 NOTE — Anesthesia Postprocedure Evaluation (Signed)
Anesthesia Post Note  Patient: Malaina C Scroggins  Procedure(s) Performed: Procedure(s) (LRB): ESOPHAGOGASTRODUODENOSCOPY (EGD) WITH PROPOFOL (N/A)  Patient location during evaluation: PACU Anesthesia Type: General Level of consciousness: awake and alert and oriented Pain management: pain level controlled Vital Signs Assessment: post-procedure vital signs reviewed and stable Respiratory status: spontaneous breathing Cardiovascular status: blood pressure returned to baseline Anesthetic complications: no     Last Vitals:  Vitals:   04/23/17 0334 04/23/17 0336  BP: (!) 100/56 99/70  Pulse: (!) 107 100  Resp: 14   Temp: 37.1 C     Last Pain:  Vitals:   04/23/17 0951  TempSrc:   PainSc: 6                  Joice Nazario

## 2017-04-23 NOTE — Discharge Summary (Addendum)
[facility] - [place] at [facility]   PATIENT NAME: [name]    MR#:  [id number]  DATE OF BIRTH:  December 01, 1985  DATE OF ADMISSION:  04/18/2017 ADMITTING PHYSICIAN: Ihor Austin, MD  DATE OF DISCHARGE: No discharge date for patient encounter.  PRIMARY CARE PHYSICIAN: Fayrene Helper, NP     ADMISSION DIAGNOSIS:  Gastritis without bleeding, unspecified chronicity, unspecified gastritis type [K29.70] Intractable vomiting with nausea, unspecified vomiting type [R11.2]  DISCHARGE DIAGNOSIS:  Active Problems:   Gastritis   Abdominal pain   Clostridium difficile enterocolitis   Nausea and vomiting   Hyponatremia   Hypokalemia   Dehydration   SECONDARY DIAGNOSIS:   Past Medical History:  Diagnosis Date  . Anxiety   . Foot pain   . Gastritis 3009,2330  . GERD (gastroesophageal reflux disease)   . Heartburn anxiety  . Hypertension   . Seasonal allergies     .pro HOSPITAL COURSE:  The patient is a 31 year old Caucasian female with medical history significant for history of anxiety, gastritis, gastroesophageal reflux disease, hypertension, seasonal allergies, who presents to the hospital with complaints of nausea, vomiting, diarrhea, epigastric abdominal discomfort. On arrival to the hospital. Patient's labs revealed hyponatremia, mild acidosis, hyperglycemia, urinalysis was unremarkable, as well as pregnancy test, patient had CT of abdomen and pelvis with contrast, which was essentially unremarkable. She was seen by gastroenterologist and underwent EGD, revealing hiatal hernia and mild Schatzki ring. She underwent HIDA scan which was also normal. Since she was having diarrheal stool, stool was taken for cultures, and it revealed positivity for C. difficile. She was initiated on Flagyl, but later switched to vancomycin, her diarrhea stopped. Her appetite and abdominal pain improved and she was able to tolerate regular diet. She was felt to be  stable to be discharged home today. Discussion by problem: 1. C. difficile enterocolitis, continue  antiemetics, vancomycin orally,  soft diet, discharge home today with primary care physician's follow-up in about 1 week.  CT scan was negative for acute pathology. Patient underwent EGD which was basically normal.  HIDA scan was normal. Initiated Flora-Q the patient was advised to eat plenty of by mouth fluids to replenish intestinal flora.   2. Nausea, vomiting , resolved, patient is able to tolerate a regular diet  3. Hyponatremia:resolved  4. History of gastritis: Continue PPI  5. Essential hypertension: Continue lisinopril, blood pressure is low, the patient was recommended to follow blood pressure readings closely, advance oral intake  6. Hypokalemia: Repleted intravenously, orally, follow as outpatient DISCHARGE CONDITIONS:    Stable  CONSULTS OBTAINED:  Treatment Team:  Wyline Mood, MD Lacey Jensen, MD  DRUG ALLERGIES:   Allergies  Allergen Reactions  . Morphine And Related Other (See Comments)    "whole body feels aggravated"    DISCHARGE MEDICATIONS:   Current Discharge Medication List    START taking these medications   Details  acidophilus (RISAQUAD) CAPS capsule Take 1 capsule by mouth daily. Qty: 30 capsule, Refills: 3    ondansetron (ZOFRAN) 4 MG tablet Take 1 tablet (4 mg total) by mouth every 6 (six) hours as needed for nausea. Qty: 20 tablet, Refills: 0    oxyCODONE-acetaminophen (PERCOCET/ROXICET) 5-325 MG tablet Take 1 tablet by mouth every 6 (six) hours as needed for moderate pain. Qty: 30 tablet, Refills: 0    vancomycin (VANCOCIN) 125 MG capsule Take 1 capsule (125 mg total) by mouth 4 (four) times daily. Qty: 56 capsule, Refills: 1  CONTINUE these medications which have NOT CHANGED   Details  fexofenadine (ALLEGRA) 180 MG tablet Take 180 mg by mouth daily as needed for allergies or rhinitis.    fluticasone (FLONASE) 50 MCG/ACT nasal  spray Place 1-2 sprays into both nostrils daily. Refills: 3    lisinopril (PRINIVIL,ZESTRIL) 20 MG tablet Take 20 mg by mouth daily.    omeprazole (PRILOSEC) 40 MG capsule Take 40 mg by mouth daily.    traZODone (DESYREL) 100 MG tablet Take 100 mg by mouth at bedtime as needed for sleep.      STOP taking these medications     ibuprofen (ADVIL,MOTRIN) 800 MG tablet          DISCHARGE INSTRUCTIONS:    The patient is to follow-up with primary care physician within one week after discharge  If you experience worsening of your admission symptoms, develop shortness of breath, life threatening emergency, suicidal or homicidal thoughts you must seek medical attention immediately by calling 911 or calling your MD immediately  if symptoms less severe.  You Must read complete instructions/literature along with all the possible adverse reactions/side effects for all the Medicines you take and that have been prescribed to you. Take any new Medicines after you have completely understood and accept all the possible adverse reactions/side effects.   Please note  You were cared for by a hospitalist during your hospital stay. If you have any questions about your discharge medications or the care you received while you were in the hospital after you are discharged, you can call the unit and asked to speak with the hospitalist on call if the hospitalist that took care of you is not available. Once you are discharged, your primary care physician will handle any further medical issues. Please note that NO REFILLS for any discharge medications will be authorized once you are discharged, as it is imperative that you return to your primary care physician (or establish a relationship with a primary care physician if you do not have one) for your aftercare needs so that they can reassess your need for medications and monitor your lab values.    Today   CHIEF COMPLAINT:   Chief Complaint  Patient presents  with  . Abdominal Pain  . Emesis    HISTORY OF PRESENT ILLNESS:     VITAL SIGNS:  Blood pressure 99/70, pulse 100, temperature 98.7 F (37.1 C), temperature source Oral, resp. rate 14, height 5\' 6"  (1.676 m), weight 112.5 kg (248 lb), last menstrual period 03/30/2017, SpO2 99 %.  I/O:    Intake/Output Summary (Last 24 hours) at 04/23/17 1505 Last data filed at 04/23/17 1434  Gross per 24 hour  Intake          2072.32 ml  Output                0 ml  Net          2072.32 ml    PHYSICAL EXAMINATION:  GENERAL:  31 y.o.-year-old patient lying in the bed with no acute distress.  EYES: Pupils equal, round, reactive to light and accommodation. No scleral icterus. Extraocular muscles intact.  HEENT: Head atraumatic, normocephalic. Oropharynx and nasopharynx clear.  NECK:  Supple, no jugular venous distention. No thyroid enlargement, no tenderness.  LUNGS: Normal breath sounds bilaterally, no wheezing, rales,rhonchi or crepitation. No use of accessory muscles of respiration.  CARDIOVASCULAR: S1, S2 normal. No murmurs, rubs, or gallops.  ABDOMEN: Soft, non-tender, non-distended. Bowel sounds present. No organomegaly or mass.  EXTREMITIES: No pedal edema, cyanosis, or clubbing.  NEUROLOGIC: Cranial nerves II through XII are intact. Muscle strength 5/5 in all extremities. Sensation intact. Gait not checked.  PSYCHIATRIC: The patient is alert and oriented x 3.  SKIN: No obvious rash, lesion, or ulcer.   DATA REVIEW:   CBC  Recent Labs Lab 04/21/17 0337  WBC 8.5  HGB 13.3  HCT 38.6  PLT 263    Chemistries   Recent Labs Lab 04/18/17 1016  04/21/17 0337  04/23/17 0427  NA 133*  < > 133*  < > 137  K 4.9  < > 3.2*  < > 3.4*  CL 107  < > 103  < > 107  CO2 19*  < > 26  < > 26  GLUCOSE 134*  < > 82  < > 90  BUN 11  < > 9  < > 15  CREATININE 0.78  < > 0.55  < > 0.80  CALCIUM 9.2  < > 9.0  < > 8.7*  MG  --   --  2.1  --   --   AST 49*  --   --   --   --   ALT 41  --   --    --   --   ALKPHOS 63  --   --   --   --   BILITOT 0.7  --   --   --   --   < > = values in this interval not displayed.  Cardiac Enzymes No results for input(s): TROPONINI in the last 168 hours.  Microbiology Results  Results for orders placed or performed during the hospital encounter of 04/18/17  C difficile quick scan w PCR reflex     Status: Abnormal   Collection Time: 04/22/17 11:55 PM  Result Value Ref Range Status   C Diff antigen POSITIVE (A) NEGATIVE Final   C Diff toxin NEGATIVE NEGATIVE Final   C Diff interpretation Results are indeterminate. See PCR results.  Final  Clostridium Difficile by PCR     Status: Abnormal   Collection Time: 04/22/17 11:55 PM  Result Value Ref Range Status   Toxigenic C Difficile by pcr POSITIVE (A) NEGATIVE Final    Comment: Positive for toxigenic C. difficile with little to no toxin production. Only treat if clinical presentation suggests symptomatic illness.    RADIOLOGY:  Nm Hepatobiliary Liver Func  Result Date: 04/22/2017 CLINICAL DATA:  Nausea . EXAM: NUCLEAR MEDICINE HEPATOBILIARY IMAGING TECHNIQUE: Sequential images of the abdomen were obtained out to 60 minutes following intravenous administration of radiopharmaceutical. RADIOPHARMACEUTICALS:  5.5 mCi Tc-50m  Choletec IV COMPARISON:  CT 04/19/2017 . FINDINGS: Prompt uptake and biliary excretion of activity by the liver is seen. Gallbladder activity is visualized, consistent with patency of cystic duct. Biliary activity passes into small bowel, consistent with patent common bile duct. IMPRESSION: Normal exam. Electronically Signed   By: Maisie Fus  Register   On: 04/22/2017 12:17    EKG:   Orders placed or performed during the hospital encounter of 06/29/15  . EKG 12-Lead  . EKG 12-Lead  . EKG      Management plans discussed with the patient, family and they are in agreement.  CODE STATUS:     Code Status Orders        Start     Ordered   04/18/17 1652  Full code  Continuous      04/18/17 1651    Code Status History  Date Active Date Inactive Code Status Order ID Comments User Context   05/25/2015  8:22 PM 06/01/2015  6:33 PM Full Code [id number]  Manus Ruddsuei, Matthew, MD Inpatient      TOTAL TIME TAKING CARE OF THIS PATIENT: 40 minutes.    Katharina CaperVAICKUTE,Harlow Carrizales M.D on 04/23/2017 at 3:05 PM  Between 7am to 6pm - Pager - [phone]  After 6pm go to [email or web address] - password [email or web address] [facility]  [facility]  Office  [phone]  CC: Primary care physician; Fayrene HelperBoswell, Chelsa H, NP

## 2017-04-23 NOTE — Progress Notes (Signed)
Pt verbalized that she is ready for the RN to review her discharge instructions; verbally reviewed AVS with pt, gave Rx for Percocet to the pt as well as a return to work note for 05/02/2017; no questions voiced at this time; pt to call out when she is ready for a wheelchair

## 2017-04-23 NOTE — Consult Note (Signed)
GI Inpatient Follow-up Note  Patient Identification: [name] is a 31 y.o. female with nausea and vomiting. Gallbladder workup negative given normal US and HIDA scan. H/x of antibiotic for recent tooth infection prescribed by dentist. Positive for C Difficile by PCR yesterday.   Started on po metronidazole which is no longer indicated for primary treatment for C. Difficile  Appetite improved today.  Subjective:  Scheduled Inpatient Medications:  . acidophilus  1 capsule Oral Daily  . enoxaparin (LOVENOX) injection  40 mg Subcutaneous Q12H  . lisinopril  40 mg Oral Daily  . pantoprazole  40 mg Oral Daily  . vancomycin  125 mg Oral QID    Continuous Inpatient Infusions:   . 0.9 % NaCl with KCl 20 mEq / L 100 mL/hr at 04/23/17 0841    PRN Inpatient Medications:  acetaminophen **OR** acetaminophen, diphenhydrAMINE, fluticasone, hydrALAZINE, iopamidol, ondansetron **OR** [DISCONTINUED] ondansetron (ZOFRAN) IV, oxyCODONE-acetaminophen, senna-docusate, traZODone  Review of Systems:  Constitutional: Weight is stable.  Eyes: No changes in vision. ENT: No oral lesions, sore throat.  GI: see HPI.  Heme/Lymph: No easy bruising.  CV: No chest pain.  GU: No hematuria.  Integumentary: No rashes.  Neuro: No headaches.  Psych: No depression/anxiety.  Endocrine: No heat/cold intolerance.  Allergic/Immunologic: No urticaria.  Resp: No cough, SOB.  Musculoskeletal: No joint swelling.    Physical Examination: BP 99/70 (BP Location: Left Arm)   Pulse 100   Temp 98.7 F (37.1 C) (Oral)   Resp 14   Ht 5\' 6"  (1.676 m)   Wt 112.5 kg (248 lb)   LMP 03/30/2017 (Exact Date)   SpO2 99%   BMI 40.03 kg/m  Gen: NAD, alert and oriented x 4 HEENT: PEERLA, EOMI, Neck: supple, no JVD or thyromegaly Chest: CTA bilaterally, no wheezes, crackles, or other adventitious sounds CV: RRR, no m/g/c/r Abd: soft, NT, ND, +BS in all four quadrants; no HSM, guarding, ridigity, or rebound  tenderness Ext: no edema Skin: no rash or lesions noted   Data: Lab Results  Component Value Date   WBC 8.5 04/21/2017   HGB 13.3 04/21/2017   HCT 38.6 04/21/2017   MCV 75.4 (L) 04/21/2017   PLT 263 04/21/2017    Recent Labs Lab 04/18/17 1016 04/19/17 0714 04/21/17 0337  HGB 12.6 12.1 13.3   Lab Results  Component Value Date   NA 137 04/23/2017   K 3.4 (L) 04/23/2017   CL 107 04/23/2017   CO2 26 04/23/2017   BUN 15 04/23/2017   CREATININE 0.80 04/23/2017   Lab Results  Component Value Date   ALT 41 04/18/2017   AST 49 (H) 04/18/2017   ALKPHOS 63 04/18/2017   BILITOT 0.7 04/18/2017   No results for input(s): APTT, INR, PTT in the last 168 hours.  Assessment/Plan: Ms. [name] is a 31 y.o. female with positive C. Difficile. Improved on metronidazole.   Recommendations: Switch to Vanco 125mg  qid x 14 days since now first line treatment for C Difficile.  Please call with questions or concerns.  Lacey JensenSteven Rivers Gassmann, MD

## 2017-04-23 NOTE — Progress Notes (Signed)
Pt discharged via wheelchair to the visitor's entrance by nursing

## 2017-04-23 NOTE — Progress Notes (Signed)
Pt previously asked for PRN nausea and pain medication, see MAR; pt's diet changed to soft diet at 0726 by Dr Winona LegatoVaickute; pt has since ordered 3 separate breakfast trays and asked for ketchup and consumed 100%

## 2017-04-23 NOTE — Progress Notes (Addendum)
  April 23, 2017  Patient: [name]  Date of Birth: 13-Oct-1986  Date of Visit: 04/18/2017    To Whom It May Concern:  [name] was [facility] and [facility] from 04/18/2017 - 04/23/2017. [name]  May return to work on 05/02/2017.  Sincerely,   Dr. Butch PennyVaickute/rt

## 2017-04-23 NOTE — Plan of Care (Signed)
Problem: Pain Managment: Goal: General experience of comfort will improve Outcome: Progressing C/o abd pain 9 (0-10) pain requiring PRN PO medication; good PO intake with meals

## 2017-04-23 NOTE — Progress Notes (Signed)
[facility] - [place] at [facility]   PATIENT NAME: Yvonne BellowBrittney [name]    MR#:  [id number]  DATE OF BIRTH:  February 09, 1986  SUBJECTIVE:  Stool came back positive for C. difficile, initiated on Flagyl orally, feels somewhat better, still complains of loose stools. Diffuse abdominal pain, better than before. Would like to try some diet.  REVIEW OF SYSTEMS:    Review of Systems  Constitutional: Negative for fever, chills weight loss HENT: Negative for ear pain, nosebleeds, congestion, facial swelling, rhinorrhea, neck pain, neck stiffness and ear discharge.   Respiratory: Negative for cough, shortness of breath, wheezing  Cardiovascular: Negative for chest pain, palpitations and leg swelling.  Gastrointestinal: Positive for abdominal pain, nausea , But no vomiting . Admits of diarrheal stool Genitourinary: Negative for dysuria, urgency, frequency, hematuria Musculoskeletal: Negative for back pain or joint pain Neurological: Negative for dizziness, seizures, syncope, focal weakness,  numbness and headaches.  Hematological: Does not bruise/bleed easily.  Psychiatric/Behavioral: Negative for hallucinations, confusion, dysphoric mood    Tolerating Diet: no      DRUG ALLERGIES:   Allergies  Allergen Reactions  . Morphine And Related Other (See Comments)    "whole body feels aggravated"    VITALS:  Blood pressure 99/70, pulse 100, temperature 98.7 F (37.1 C), temperature source Oral, resp. rate 14, height 5\' 6"  (1.676 m), weight 112.5 kg (248 lb), last menstrual period 03/30/2017, SpO2 99 %.  PHYSICAL EXAMINATION:  Constitutional: Obese. No distress. HENT: Normocephalic. Marland Kitchen. Oropharynx is clear and moist.  Eyes: Conjunctivae and EOM are normal. PERRLA, no scleral icterus.  Neck: Normal ROM. Neck supple. No JVD. No tracheal deviation. CVS: RRR, S1/S2 +, no murmurs, no gallops, no carotid bruit.  Pulmonary: Effort and breath sounds normal, no stridor, rhonchi, wheezes,  rales.  Abdominal: Soft. BS +,  no distension, Mild discomfort diffusely but no rebound or guarding was noted, no rebound or guarding.  Musculoskeletal: Normal range of motion. No edema and no tenderness.  Neuro: Alert. CN 2-12 grossly intact. No focal deficits. Skin: Skin is warm and dry. No rash noted. Psychiatric: Normal mood and affect.      LABORATORY PANEL:   CBC  Recent Labs Lab 04/21/17 0337  WBC 8.5  HGB 13.3  HCT 38.6  PLT 263   ------------------------------------------------------------------------------------------------------------------  Chemistries   Recent Labs Lab 04/18/17 1016  04/21/17 0337  04/23/17 0427  NA 133*  < > 133*  < > 137  K 4.9  < > 3.2*  < > 3.4*  CL 107  < > 103  < > 107  CO2 19*  < > 26  < > 26  GLUCOSE 134*  < > 82  < > 90  BUN 11  < > 9  < > 15  CREATININE 0.78  < > 0.55  < > 0.80  CALCIUM 9.2  < > 9.0  < > 8.7*  MG  --   --  2.1  --   --   AST 49*  --   --   --   --   ALT 41  --   --   --   --   ALKPHOS 63  --   --   --   --   BILITOT 0.7  --   --   --   --   < > = values in this interval not displayed. ------------------------------------------------------------------------------------------------------------------  Cardiac Enzymes No results for input(s): TROPONINI in the last 168 hours. ------------------------------------------------------------------------------------------------------------------  RADIOLOGY:  Nm Hepatobiliary Liver Func  Result Date: 04/22/2017 CLINICAL DATA:  Nausea . EXAM: NUCLEAR MEDICINE HEPATOBILIARY IMAGING TECHNIQUE: Sequential images of the abdomen were obtained out to 60 minutes following intravenous administration of radiopharmaceutical. RADIOPHARMACEUTICALS:  5.5 mCi Tc-55m  Choletec IV COMPARISON:  CT 04/19/2017 . FINDINGS: Prompt uptake and biliary excretion of activity by the liver is seen. Gallbladder activity is visualized, consistent with patency of cystic duct. Biliary activity passes  into small bowel, consistent with patent common bile duct. IMPRESSION: Normal exam. Electronically Signed   By: Maisie Fus  Register   On: 04/22/2017 12:17     ASSESSMENT AND PLAN:   31 year old female with anxiety and gastritis who presents with generalized abdominal pain, nausea vomiting diarrhea.  1. C. difficile enterocolitis, continue patient on IV fluids, antiemetics, vancomycin orally, initiate patient on soft diet, possible discharge home today if tolerates diet  CT scan is negative for acute pathology. Patient underwent EGD which was basically normal.  HIDA scan was normal. Initiate Flora-Q  2. Nausea, vomiting Continue supportive care and IV fluids, initiated on soft diet, follow oral intake  3. Hyponatremia:Continue IV fluids, resolved  4. History of gastritis: Continue PPI  5. Essential hypertension: Continue lisinopril, blood pressure is low  6. Hypokalemia: Repleting intravenously, orally   Management plans discussed with the patient and she is in agreement.  CODE STATUS: full  TOTAL TIME TAKING CARE OF THIS PATIENT:  40 minutes.     POSSIBLE D/C 1-2 days, DEPENDING ON CLINICAL CONDITION.   Katharina Caper M.D on 04/23/2017 at 11:43 AM  Between 7am to 6pm - Pager - [phone] After 6pm go to [email or web address] - Social research officer, government  [facility] [place] [facility]  Office  [phone]  CC: Primary care physician; Fayrene Helper, NP  Note: This dictation was prepared with Dragon dictation along with smaller phrase technology. Any transcriptional errors that result from this process are unintentional.

## 2017-04-23 NOTE — Progress Notes (Signed)
MD order received in [facility] to discharge pt home today; discharge paper work prepared and verbally informed pt to call when she is ready (currently showering) for RN to review the discharge instructions with her; pt's discharge pending review of AVS and ride to take her home

## 2017-04-25 ENCOUNTER — Encounter: Payer: Self-pay | Admitting: Gastroenterology

## 2017-04-27 LAB — H. PYLORI ANTIGEN, STOOL: H. Pylori Stool Ag, Eia: NEGATIVE

## 2017-04-28 ENCOUNTER — Telehealth: Payer: Self-pay

## 2017-04-28 NOTE — Telephone Encounter (Signed)
-----   Message from Wyline MoodKiran Anna, MD sent at 04/27/2017 12:13 PM EDT ----- H pylori stool antigen negative

## 2017-04-28 NOTE — Telephone Encounter (Signed)
Advised patient of results per Dr. Tobi BastosAnna.  H pylori stool antigen negative   Diff antigen NEGATIVE POSITIVE    C Diff toxin NEGATIVE NEGATIVE   C Diff interpretation  Results are indeterminate. See PCR results.    Read patient result notes verbatim from report... Positive for toxigenic C. difficile with little to no toxin production. Only treat if clinical presentation suggests symptomatic illness.

## 2017-05-02 NOTE — ED Provider Notes (Signed)
[facility] Emergency Department Provider Note   ____________________________________________   First MD Initiated Contact with Patient 04/02/17 [phone]     (approximate)  I have reviewed the triage vital signs and the nursing notes.   HISTORY  Chief Complaint Dental Pain   HPI [name] is a 31 y.o. female who complains of severe toothache for several days she's where she's had overdosed on Tylenol and Goody powders. Patient does have less than optimal dentition. I do not really see any swollen gums or anything. Patient does have tenderness to percussion of some of her teeth both upper and lower. The pain she says is severe and is been going on constantly for several days and the teeth are tender to percussion.   Past Medical History:  Diagnosis Date  . Anxiety   . Foot pain   . Gastritis [id number]  . GERD (gastroesophageal reflux disease)   . Heartburn anxiety  . Hypertension   . Seasonal allergies     Patient Active Problem List   Diagnosis Date Noted  . Clostridium difficile enterocolitis 04/23/2017  . Nausea and vomiting 04/23/2017  . Hyponatremia 04/23/2017  . Hypokalemia 04/23/2017  . Dehydration 04/23/2017  . Abdominal pain 04/19/2017  . Gastritis 04/18/2017  . Acute perforated appendicitis 05/25/2015  . Abdominal pain, left upper quadrant 02/21/2014  . Splenomegaly 02/21/2014  . Generalized anxiety disorder 02/21/2014  . Hemorrhage of rectum and anus 02/21/2014  . Lump or mass in breast 05/01/2013    Past Surgical History:  Procedure Laterality Date  . APPENDECTOMY N/A 05/25/2015   Procedure: APPENDECTOMY;  Surgeon: Manus Rudd, MD;  Location: [facility] OR;  Service: General;  Laterality: N/A;  . BREAST BIOPSY Right 05/03/13  . ESOPHAGOGASTRODUODENOSCOPY (EGD) WITH PROPOFOL N/A 04/21/2017   Procedure: ESOPHAGOGASTRODUODENOSCOPY (EGD) WITH PROPOFOL;  Surgeon: Wyline Mood, MD;  Location: [facility] ENDOSCOPY;  Service: Endoscopy;   Laterality: N/A;  . FOOT SURGERY    . WRIST SURGERY      Prior to Admission medications   Medication Sig Start Date End Date Taking? Authorizing Provider  acidophilus (RISAQUAD) CAPS capsule Take 1 capsule by mouth daily. 04/23/17   Katharina Caper, MD  cephALEXin (KEFLEX) 500 MG capsule TAKE 1 CAPSULE (500 MG TOTAL) BY MOUTH 4 (FOUR) TIMES DAILY. 03/02/17   [provider]  [employer] GAS RELIEF 80 MG chewable tablet CHEW 1 TABLET (80 MG TOTAL) BY MOUTH EVERY 6 (SIX) HOURS AS NEEDED FOR FLATULENCE. 02/21/17   [provider]  diphenoxylate-atropine (LOMOTIL) 2.5-0.025 MG tablet TAKE 1 TABLET BY MOUTH 4 TIMES DAILY AS NEEDED FOR DIARRHEA OR LOOSE STOOLS 02/21/17   [provider]  fexofenadine (ALLEGRA) 180 MG tablet Take 180 mg by mouth daily as needed for allergies or rhinitis.    [provider]  fexofenadine (ALLEGRA) 60 MG tablet TAKE 3 TABLETS ORALLY DAILY FOR ALLERGIES 03/01/17   [provider]  fluticasone (FLONASE) 50 MCG/ACT nasal spray Place 1-2 sprays into both nostrils daily. 03/01/17   [provider]  ibuprofen (ADVIL,MOTRIN) 600 MG tablet Take 600 mg by mouth every 6 (six) hours as needed. 02/21/17   [provider]  lisinopril (PRINIVIL,ZESTRIL) 20 MG tablet Take 20 mg by mouth daily.    [provider]  omeprazole (PRILOSEC) 40 MG capsule Take 40 mg by mouth daily.    [provider]  ondansetron (ZOFRAN) 4 MG tablet Take 1 tablet (4 mg total) by mouth every 6 (six) hours as needed for nausea. 04/23/17  Katharina CaperVaickute, Rima, MD  oxyCODONE-acetaminophen (PERCOCET/ROXICET) 5-325 MG tablet Take 1 tablet by mouth every 6 (six) hours as needed for moderate pain. 04/23/17   Katharina CaperVaickute, Rima, MD  phenazopyridine (PYRIDIUM) 200 MG tablet TAKE 1 TABLET (200 MG TOTAL) BY MOUTH 3 (THREE) TIMES DAILY AS NEEDED FOR PAIN. 03/02/17   [provider]  phentermine (ADIPEX-P) 37.5 MG tablet Take 37.5 mg by mouth every morning. 02/20/17    [provider]  promethazine (PHENERGAN) 25 MG tablet TAKE 1 TABLET ORALLY NIGHTLY AT BEDTIME AS NEEDED FOR NAUSEA (WILL CAUSE DROWSINESS) 02/03/17   [provider]  traZODone (DESYREL) 100 MG tablet Take 100 mg by mouth at bedtime as needed for sleep.    [provider]  vancomycin (VANCOCIN) 125 MG capsule Take 1 capsule (125 mg total) by mouth 4 (four) times daily. 04/23/17   Katharina CaperVaickute, Rima, MD    Allergies Morphine and related  Family History  Problem Relation Age of Onset  . Hypertension Father   . Diabetes Father   . Liver cancer Paternal Grandfather   . Diabetes Maternal Grandmother   . Kidney failure Maternal Grandmother   . Heart failure Maternal Grandmother   . Stroke Maternal Grandmother   . Lung cancer Maternal Grandfather   . Stroke Paternal Grandmother   . Prostate cancer Neg Hx   . Bladder Cancer Neg Hx     Social History Social History  Substance Use Topics  . Smoking status: Never Smoker  . Smokeless tobacco: Never Used  . Alcohol use Yes     Comment: occ    Review of Systems  Constitutional: No fever/chills Eyes: No visual changes. ENT: No sore throat. Cardiovascular: Denies chest pain. Respiratory: Denies shortness of breath. Gastrointestinal: No abdominal pain.  No nausea, no vomiting.  No diarrhea.  No constipation. Genitourinary: Negative for dysuria. Musculoskeletal: Negative for back pain. Skin: Negative for rash. Neurological: Negative for headaches, focal weakness or numbness.   ____________________________________________   PHYSICAL EXAM:  VITAL SIGNS: ED Triage Vitals  Enc Vitals Group     BP 04/02/17 0334 (!) 146/108     Pulse Rate 04/02/17 0334 96     Resp 04/02/17 0334 20     Temp 04/02/17 0334 98.3 F (36.8 C)     Temp Source 04/02/17 0334 Oral     SpO2 04/02/17 0334 99 %     Weight 04/02/17 0332 248 lb (112.5 kg)     Height 04/02/17 0332 5\' 6"  (1.676 m)     Head Circumference --      Peak Flow  --      Pain Score 04/02/17 0332 10     Pain Loc --      Pain Edu? --      Excl. in GC? --     Constitutional: Alert and oriented. Crying in pain Eyes: Conjunctivae are normal. PERRL. EOMI. Head: Atraumatic. Nose: No congestion/rhinnorhea. Mouth/Throat: Mucous membranes are moist.  Oropharynx non-erythematous. Teeth as described in history of present illness Neck: No stridor. No cervical  lymphadenopathy. Cardiovascular: Normal rate, regular rhythm. Grossly normal heart sounds.  Good peripheral circulation. Respiratory: Normal respiratory effort.  No retractions. Lungs CTAB. Gastrointestinal: Soft and nontender. No distention. No abdominal bruits. No CVA tenderness.  ____________________________________________   LABS (all labs ordered are listed, but only abnormal results are displayed)  Labs Reviewed  COMPREHENSIVE METABOLIC PANEL   ____________________________________________  EKG   ____________________________________________  RADIOLOGY  No results found.  ____________________________________________   PROCEDURES  Procedure(s)  performed:  Procedures  Critical Care performed:   ____________________________________________   INITIAL IMPRESSION / ASSESSMENT AND PLAN / ED COURSE  Pertinent labs & imaging results that were available during my care of the patient were reviewed by me and considered in my medical decision making (see chart for details).  Patient given 1 dose of pain medicine explained to her in detail I really can't do much more we'll give her some antibiotics as well. She is to follow-up with a dentist list provided.      ____________________________________________   FINAL CLINICAL IMPRESSION(S) / ED DIAGNOSES  Final diagnoses:  Pain, dental      NEW MEDICATIONS STARTED DURING THIS VISIT:  Discharge Medication List as of 04/02/2017  7:48 AM    START taking these medications   Details  lidocaine (XYLOCAINE) 2 % solution Use as  directed 20 mLs in the mouth or throat as needed for mouth pain., Starting Sat 04/02/2017, Print    oxyCODONE-acetaminophen (PERCOCET) 7.5-325 MG tablet Take 1 tablet by mouth every 4 (four) hours as needed for severe pain., Starting Sat 04/02/2017, Until Sun 04/02/2018, Print    penicillin v potassium (VEETID) 500 MG tablet Take 1 tablet (500 mg total) by mouth 4 (four) times daily., Starting Sat 04/02/2017, Print         Note:  This document was prepared using Dragon voice recognition software and may include unintentional dictation errors.    Arnaldo Natal, MD 05/02/17 1212

## 2017-05-03 ENCOUNTER — Telehealth: Payer: Self-pay

## 2017-05-03 NOTE — Telephone Encounter (Signed)
LVM for patient callback for results per Dr. Anna.    - H pylori stool antigen negative  

## 2017-05-23 ENCOUNTER — Other Ambulatory Visit: Payer: Self-pay | Admitting: Gastroenterology

## 2017-06-17 ENCOUNTER — Other Ambulatory Visit: Payer: Self-pay | Admitting: Gastroenterology

## 2017-06-23 NOTE — Progress Notes (Signed)
On 8/22 and 06/23/2017-LVMM on [phone] for paitnet to call Endoscopy.  On 06/21/17- no anxwer at [phone].  On 06/22/2017- no anxwer at [phone] number.  On 06/23/2017 call could not be completed to [phone] number.

## 2017-06-24 NOTE — Progress Notes (Signed)
06/23/2017- Patient call back to endoscopy nirse after nurse had left for day and on 06/24/2017 endoscopy nurse called patient back and LVMM.

## 2017-06-28 ENCOUNTER — Encounter (HOSPITAL_COMMUNITY): Payer: Self-pay | Admitting: *Deleted

## 2017-07-06 ENCOUNTER — Ambulatory Visit (HOSPITAL_COMMUNITY): Payer: Medicaid Other | Admitting: Anesthesiology

## 2017-07-06 ENCOUNTER — Encounter (HOSPITAL_COMMUNITY)
Admission: RE | Disposition: A | Discharge status: Home / Self Care | Payer: Self-pay | Source: Ambulatory Visit | Attending: Gastroenterology

## 2017-07-06 ENCOUNTER — Encounter (HOSPITAL_COMMUNITY): Payer: Self-pay | Admitting: *Deleted

## 2017-07-06 ENCOUNTER — Ambulatory Visit (HOSPITAL_COMMUNITY)
Admission: RE | Admit: 2017-07-06 | Discharge: 2017-07-06 | Disposition: A | Discharge status: Home / Self Care | Payer: Medicaid Other | Source: Ambulatory Visit | Attending: Gastroenterology | Admitting: Gastroenterology

## 2017-07-06 DIAGNOSIS — K921 Melena: Secondary | ICD-10-CM | POA: Insufficient documentation

## 2017-07-06 DIAGNOSIS — K648 Other hemorrhoids: Secondary | ICD-10-CM | POA: Insufficient documentation

## 2017-07-06 DIAGNOSIS — Z980 Intestinal bypass and anastomosis status: Secondary | ICD-10-CM | POA: Diagnosis not present

## 2017-07-06 DIAGNOSIS — F329 Major depressive disorder, single episode, unspecified: Secondary | ICD-10-CM | POA: Diagnosis not present

## 2017-07-06 DIAGNOSIS — K219 Gastro-esophageal reflux disease without esophagitis: Secondary | ICD-10-CM | POA: Insufficient documentation

## 2017-07-06 DIAGNOSIS — R1030 Lower abdominal pain, unspecified: Secondary | ICD-10-CM | POA: Insufficient documentation

## 2017-07-06 DIAGNOSIS — K644 Residual hemorrhoidal skin tags: Secondary | ICD-10-CM | POA: Insufficient documentation

## 2017-07-06 DIAGNOSIS — Z6841 Body Mass Index (BMI) 40.0 and over, adult: Secondary | ICD-10-CM | POA: Insufficient documentation

## 2017-07-06 DIAGNOSIS — I10 Essential (primary) hypertension: Secondary | ICD-10-CM | POA: Diagnosis not present

## 2017-07-06 DIAGNOSIS — F419 Anxiety disorder, unspecified: Secondary | ICD-10-CM | POA: Insufficient documentation

## 2017-07-06 HISTORY — DX: Depression, unspecified: F32.A

## 2017-07-06 HISTORY — PX: COLONOSCOPY WITH PROPOFOL: SHX5780

## 2017-07-06 HISTORY — DX: Major depressive disorder, single episode, unspecified: F32.9

## 2017-07-06 SURGERY — COLONOSCOPY WITH PROPOFOL
Anesthesia: Monitor Anesthesia Care

## 2017-07-06 MED ORDER — LIDOCAINE 2% (20 MG/ML) 5 ML SYRINGE
INTRAMUSCULAR | Status: DC | PRN
  Administered 2017-07-06: 60 mg via INTRAVENOUS

## 2017-07-06 MED ORDER — PROPOFOL 10 MG/ML IV BOLUS
INTRAVENOUS | Status: AC
  Filled 2017-07-06: qty 20

## 2017-07-06 MED ORDER — SODIUM CHLORIDE 0.9 % IV SOLN: INTRAVENOUS | Status: DC

## 2017-07-06 MED ORDER — PROPOFOL 10 MG/ML IV BOLUS
INTRAVENOUS | Status: AC
  Filled 2017-07-06: qty 40

## 2017-07-06 MED ORDER — LACTATED RINGERS IV SOLN
INTRAVENOUS | Status: DC
  Administered 2017-07-06: 10:00:00 via INTRAVENOUS

## 2017-07-06 MED ORDER — LIDOCAINE 2% (20 MG/ML) 5 ML SYRINGE
INTRAMUSCULAR | Status: AC
  Filled 2017-07-06: qty 5

## 2017-07-06 MED ORDER — PROPOFOL 10 MG/ML IV BOLUS
INTRAVENOUS | Status: DC | PRN
  Administered 2017-07-06 (×2): 30 mg via INTRAVENOUS
  Administered 2017-07-06: 20 mg via INTRAVENOUS
  Administered 2017-07-06 (×13): 30 mg via INTRAVENOUS
  Administered 2017-07-06: 20 mg via INTRAVENOUS
  Administered 2017-07-06: 30 mg via INTRAVENOUS

## 2017-07-06 SURGICAL SUPPLY — 22 items

## 2017-07-06 NOTE — Transfer of Care (Signed)
Immediate Anesthesia Transfer of Care Note  Patient: [name]  Procedure(s) Performed: Procedure(s): COLONOSCOPY WITH PROPOFOL (N/A)  Patient Location: PACU  Anesthesia Type:MAC  Level of Consciousness:  sedated, patient cooperative and responds to stimulation  Airway & Oxygen Therapy:Patient Spontanous Breathing and Patient connected to face mask oxgen  Post-op Assessment:  Report given to PACU RN and Post -op Vital signs reviewed and stable  Post vital signs:  Reviewed and stable  Last Vitals:  Vitals:   07/06/17 0940  BP: 115/68  Pulse: 88  Resp: 12  Temp: 36.7 C  SpO2: 70%    Complications: No apparent anesthesia complications

## 2017-07-06 NOTE — Discharge Instructions (Signed)

## 2017-07-06 NOTE — H&P (Signed)
Patient interval history reviewed.  Patient examined again.  There has been no change from documented H/P dated (scanned into chart from our office) except as documented above.  Assessment:  1.  Abdominal pain. 2.  Blood in stool.  Plan:  1.  Colonoscopy. 2.  Risks (bleeding, infection, bowel perforation that could require surgery, sedation-related changes in cardiopulmonary systems), benefits (identification and possible treatment of source of symptoms, exclusion of certain causes of symptoms), and alternatives (watchful waiting, radiographic imaging studies, empiric medical treatment) of colonoscopy were explained to patient/family in detail and patient wishes to proceed.

## 2017-07-06 NOTE — Op Note (Signed)
[facility] Patient Name: [name] Procedure Date: 07/06/2017 MRN: [id number] Attending MD: Willis Modena , MD Date of Birth: Oct 04, 1986 CSN: [id number] Age: 31 Admit Type: Outpatient Procedure:                Colonoscopy Indications:              Last colonoscopy: 2012, Lower abdominal pain,                            Hematochezia Providers:                Willis Modena, MD, Dwain Sarna, RN, Mercy Riding, Technician Referring MD:              Medicines:                Monitored Anesthesia Care Complications:            No immediate complications. Estimated Blood Loss:     Estimated blood loss: none. Procedure:                Pre-Anesthesia Assessment:                           - Prior to the procedure, a History and Physical                            was performed, and patient medications and                            allergies were reviewed. The patient's tolerance of                            previous anesthesia was also reviewed. The risks                            and benefits of the procedure and the sedation                            options and risks were discussed with the patient.                            All questions were answered, and informed consent                            was obtained. Prior Anticoagulants: The patient has                            taken no previous anticoagulant or antiplatelet                            agents. ASA Grade Assessment: III - A patient with                            severe systemic disease. After reviewing  the risks                            and benefits, the patient was deemed in                            satisfactory condition to undergo the procedure.                           After obtaining informed consent, the colonoscope                            was passed under direct vision. Throughout the                            procedure, the patient's blood pressure,  pulse, and                            oxygen saturations were monitored continuously. The                            EC-[id number] ([id number]) scope was introduced through                            the anus and advanced to the the ileocolonic                            anastomosis. The colonoscopy was performed without                            difficulty. The patient tolerated the procedure                            well. The quality of the bowel preparation was                            good. The rectum and Neoterminal ileum and                            ileocolic anastomosis were photographed. Scope In: 10:43:04 AM Scope Out: 10:57:55 AM Scope Withdrawal Time: 0 hours 9 minutes 8 seconds  Total Procedure Duration: 0 hours 14 minutes 51 seconds  Findings:      The perianal exam findings include non-thrombosed external hemorrhoids.      Internal hemorrhoids were found during retroflexion. The hemorrhoids       were mild.      Colon otherwise normal; no other polyps, masses, vascular ectasias, or       inflammatory changes were seen.      No additional abnormalities were found on retroflexion. Impression:               - Non-thrombosed external hemorrhoids found on                            perianal exam.                           -  Internal hemorrhoids. Likely cause of                            intermittent abdominal pain.                           - No explanation for abdominal pain seen. Moderate Sedation:      None Recommendation:           - Patient has a contact number available for                            emergencies. The signs and symptoms of potential                            delayed complications were discussed with the                            patient. Return to normal activities tomorrow.                            Written discharge instructions were provided to the                            patient.                           - Discharge patient to home (via  wheelchair).                           - High fiber diet indefinitely.                           - Topical therapies (e.g., Preparation-H),                            avoidance of constipation/straining, liberal water                            intake, for management of hemorrhoids.                           - Return to GI clinic in 4 weeks. May need pain                            clinic evaluation.                           - Return to referring physician as previously                            scheduled.                           - Continue present medications.                           -  Repeat colonoscopy (date not yet determined) for                            screening purposes. Procedure Code(s):        --- Professional ---                           [phone], Colonoscopy, flexible; diagnostic, including                            collection of specimen(s) by brushing or washing,                            when performed (separate procedure) Diagnosis Code(s):        --- Professional ---                           K64.4, Residual hemorrhoidal skin tags                           K64.8, Other hemorrhoids                           R10.30, Lower abdominal pain, unspecified                           K92.1, Melena (includes Hematochezia) CPT copyright 2016 American Medical Association. All rights reserved. The codes documented in this report are preliminary and upon coder review may  be revised to meet current compliance requirements. Willis ModenaWilliam Keirra Zeimet, MD 07/06/2017 11:18:30 AM This report has been signed electronically. Number of Addenda: 0

## 2017-07-06 NOTE — Anesthesia Preprocedure Evaluation (Addendum)
Anesthesia Evaluation  Patient identified by MRN, date of birth, ID band Patient awake    Reviewed: Allergy & Precautions, NPO status , Patient's Chart, lab work & pertinent test results  History of Anesthesia Complications Negative for: history of anesthetic complications  Airway Mallampati: II  TM Distance: >3 FB Neck ROM: Full    Dental  (+) Teeth Intact, Dental Advisory Given   Pulmonary neg pulmonary ROS,    Pulmonary exam normal        Cardiovascular hypertension, Pt. on medications Normal cardiovascular exam     Neuro/Psych PSYCHIATRIC DISORDERS Anxiety Depression negative neurological ROS     GI/Hepatic Neg liver ROS, GERD  Medicated and Controlled,Gastritis  blood in stool   Endo/Other  Morbid obesity  Renal/GU negative Renal ROS  negative genitourinary   Musculoskeletal negative musculoskeletal ROS (+)   Abdominal (+) + obese,   Peds negative pediatric ROS (+)  Hematology negative hematology ROS (+)   Anesthesia Other Findings   Reproductive/Obstetrics                            Anesthesia Physical  Anesthesia Plan  ASA: III  Anesthesia Plan: MAC   Post-op Pain Management:    Induction: Intravenous  PONV Risk Score and Plan: 2 and Propofol infusion and Treatment may vary due to age or medical condition  Airway Management Planned: Natural Airway  Additional Equipment:   Intra-op Plan:   Post-operative Plan:   Informed Consent: I have reviewed the patients History and Physical, chart, labs and discussed the procedure including the risks, benefits and alternatives for the proposed anesthesia with the patient or authorized representative who has indicated his/her understanding and acceptance.   Dental advisory given  Plan Discussed with: CRNA  Anesthesia Plan Comments: (Pregnancy test offered to and declined by patient)       Anesthesia Quick  Evaluation

## 2017-07-06 NOTE — Anesthesia Postprocedure Evaluation (Signed)
Anesthesia Post Note  Patient: [name]  Procedure(s) Performed: Procedure(s) (LRB): COLONOSCOPY WITH PROPOFOL (N/A)     Patient location during evaluation: PACU Anesthesia Type: MAC Level of consciousness: awake and alert Pain management: pain level controlled Vital Signs Assessment: post-procedure vital signs reviewed and stable Respiratory status: spontaneous breathing, nonlabored ventilation, respiratory function stable and patient connected to nasal cannula oxygen Cardiovascular status: stable and blood pressure returned to baseline Anesthetic complications: no    Last Vitals:  Vitals:   07/06/17 1110 07/06/17 1120  BP: (!) 92/51 (!) 108/46  Pulse: 79 75  Resp: 16 12  Temp:    SpO2: 99% 100%    Last Pain:  Vitals:   07/06/17 1104  TempSrc: Oral  PainSc:                  Calem Cocozza P Norvell Caswell

## 2017-07-07 ENCOUNTER — Encounter (HOSPITAL_COMMUNITY): Payer: Self-pay | Admitting: Gastroenterology

## 2017-07-13 ENCOUNTER — Ambulatory Visit (HOSPITAL_COMMUNITY): Admit: 2017-07-13 | Payer: Medicaid Other | Admitting: Gastroenterology

## 2017-07-13 ENCOUNTER — Encounter (HOSPITAL_COMMUNITY): Payer: Self-pay

## 2017-07-13 SURGERY — COLONOSCOPY WITH PROPOFOL
Anesthesia: Monitor Anesthesia Care

## 2017-08-03 ENCOUNTER — Encounter: Payer: Self-pay | Admitting: Obstetrics and Gynecology

## 2017-08-08 ENCOUNTER — Encounter: Payer: Self-pay | Admitting: Obstetrics and Gynecology

## 2017-08-08 ENCOUNTER — Emergency Department
Admission: EM | Admit: 2017-08-08 | Discharge: 2017-08-08 | Disposition: A | Discharge status: Home / Self Care | Payer: Medicaid Other | Attending: Emergency Medicine | Admitting: Emergency Medicine

## 2017-08-08 ENCOUNTER — Encounter: Payer: Self-pay | Admitting: Emergency Medicine

## 2017-08-08 DIAGNOSIS — R1011 Right upper quadrant pain: Secondary | ICD-10-CM | POA: Diagnosis not present

## 2017-08-08 DIAGNOSIS — M25511 Pain in right shoulder: Secondary | ICD-10-CM | POA: Diagnosis present

## 2017-08-08 DIAGNOSIS — M791 Myalgia, unspecified site: Secondary | ICD-10-CM

## 2017-08-08 DIAGNOSIS — R079 Chest pain, unspecified: Secondary | ICD-10-CM | POA: Insufficient documentation

## 2017-08-08 DIAGNOSIS — I10 Essential (primary) hypertension: Secondary | ICD-10-CM | POA: Diagnosis not present

## 2017-08-08 DIAGNOSIS — M79601 Pain in right arm: Secondary | ICD-10-CM | POA: Insufficient documentation

## 2017-08-08 MED ORDER — ACETAMINOPHEN 500 MG PO TABS
1000.0000 mg | ORAL_TABLET | Freq: Once | ORAL | Status: AC
  Administered 2017-08-08: 1000 mg via ORAL
  Filled 2017-08-08: qty 2

## 2017-08-08 MED ORDER — CYCLOBENZAPRINE HCL 10 MG PO TABS
10.0000 mg | ORAL_TABLET | Freq: Once | ORAL | Status: AC
  Administered 2017-08-08: 10 mg via ORAL
  Filled 2017-08-08: qty 1

## 2017-08-08 MED ORDER — CYCLOBENZAPRINE HCL 10 MG PO TABS: 10.0000 mg | ORAL_TABLET | Freq: Three times a day (TID) | ORAL | 0 refills | Status: DC | PRN
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
## 2017-08-08 NOTE — ED Notes (Signed)
[place] officer arrived, escorted to pt's room.
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
## 2017-08-08 NOTE — ED Notes (Signed)
Officer at bedside at this time speaking with patient

## 2017-08-11 ENCOUNTER — Encounter: Payer: Self-pay | Admitting: Obstetrics and Gynecology

## 2017-08-19 ENCOUNTER — Other Ambulatory Visit: Payer: Self-pay | Admitting: Surgical Oncology

## 2017-08-19 DIAGNOSIS — K219 Gastro-esophageal reflux disease without esophagitis: Secondary | ICD-10-CM

## 2017-08-25 ENCOUNTER — Other Ambulatory Visit: Payer: Medicaid Other

## 2017-08-30 ENCOUNTER — Ambulatory Visit
Admission: RE | Admit: 2017-08-30 | Discharge: 2017-08-30 | Disposition: A | Discharge status: Home / Self Care | Payer: Medicaid Other | Source: Ambulatory Visit | Attending: Surgical Oncology | Admitting: Surgical Oncology

## 2017-08-30 DIAGNOSIS — K219 Gastro-esophageal reflux disease without esophagitis: Secondary | ICD-10-CM

## 2017-09-27 ENCOUNTER — Ambulatory Visit (HOSPITAL_COMMUNITY)
Admission: EM | Admit: 2017-09-27 | Discharge: 2017-09-27 | Disposition: A | Discharge status: Home / Self Care | Payer: Medicaid Other | Attending: Family Medicine | Admitting: Family Medicine

## 2017-09-27 ENCOUNTER — Encounter (HOSPITAL_COMMUNITY): Payer: Self-pay | Admitting: Family Medicine

## 2017-09-27 DIAGNOSIS — Z8249 Family history of ischemic heart disease and other diseases of the circulatory system: Secondary | ICD-10-CM | POA: Insufficient documentation

## 2017-09-27 DIAGNOSIS — I10 Essential (primary) hypertension: Secondary | ICD-10-CM | POA: Insufficient documentation

## 2017-09-27 DIAGNOSIS — Z833 Family history of diabetes mellitus: Secondary | ICD-10-CM | POA: Insufficient documentation

## 2017-09-27 DIAGNOSIS — Z87440 Personal history of urinary (tract) infections: Secondary | ICD-10-CM | POA: Insufficient documentation

## 2017-09-27 DIAGNOSIS — K219 Gastro-esophageal reflux disease without esophagitis: Secondary | ICD-10-CM | POA: Insufficient documentation

## 2017-09-27 DIAGNOSIS — R350 Frequency of micturition: Secondary | ICD-10-CM

## 2017-09-27 DIAGNOSIS — Z885 Allergy status to narcotic agent status: Secondary | ICD-10-CM | POA: Diagnosis not present

## 2017-09-27 DIAGNOSIS — Z3202 Encounter for pregnancy test, result negative: Secondary | ICD-10-CM

## 2017-09-27 DIAGNOSIS — R300 Dysuria: Secondary | ICD-10-CM

## 2017-09-27 LAB — POCT URINALYSIS DIP (DEVICE)
Bilirubin Urine: NEGATIVE
Glucose, UA: NEGATIVE mg/dL
Ketones, ur: NEGATIVE mg/dL
Nitrite: NEGATIVE
Protein, ur: NEGATIVE mg/dL
Specific Gravity, Urine: >=1.03 (ref 1.005–1.030)
Urobilinogen, UA: 0.2 mg/dL (ref 0.0–1.0)
pH: 5.5 (ref 5.0–8.0)

## 2017-09-27 LAB — POCT PREGNANCY, URINE: Preg Test, Ur: NEGATIVE

## 2017-09-27 MED ORDER — CEPHALEXIN 500 MG PO CAPS: 500.0000 mg | ORAL_CAPSULE | Freq: Two times a day (BID) | ORAL | 0 refills | Status: DC

## 2017-09-27 NOTE — Discharge Instructions (Signed)
We will contact you with any positive results concerning your urine culture.

## 2017-09-27 NOTE — ED Triage Notes (Signed)
Pt here for urinary urgency symptoms.
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
## 2017-09-29 LAB — URINE CULTURE: Culture: NO GROWTH

## 2017-10-18 ENCOUNTER — Encounter (HOSPITAL_COMMUNITY): Payer: Self-pay

## 2017-10-18 ENCOUNTER — Other Ambulatory Visit: Payer: Self-pay

## 2017-10-18 ENCOUNTER — Inpatient Hospital Stay (HOSPITAL_COMMUNITY)
Admission: AD | Admit: 2017-10-18 | Discharge: 2017-10-18 | Disposition: A | Discharge status: Home / Self Care | Payer: Medicaid Other | Source: Ambulatory Visit | Attending: Family Medicine | Admitting: Family Medicine

## 2017-10-18 DIAGNOSIS — Z833 Family history of diabetes mellitus: Secondary | ICD-10-CM | POA: Diagnosis not present

## 2017-10-18 DIAGNOSIS — Z79891 Long term (current) use of opiate analgesic: Secondary | ICD-10-CM | POA: Diagnosis not present

## 2017-10-18 DIAGNOSIS — Z801 Family history of malignant neoplasm of trachea, bronchus and lung: Secondary | ICD-10-CM | POA: Insufficient documentation

## 2017-10-18 DIAGNOSIS — Z885 Allergy status to narcotic agent status: Secondary | ICD-10-CM | POA: Diagnosis not present

## 2017-10-18 DIAGNOSIS — N946 Dysmenorrhea, unspecified: Secondary | ICD-10-CM

## 2017-10-18 DIAGNOSIS — R110 Nausea: Secondary | ICD-10-CM | POA: Diagnosis not present

## 2017-10-18 DIAGNOSIS — F419 Anxiety disorder, unspecified: Secondary | ICD-10-CM | POA: Diagnosis not present

## 2017-10-18 DIAGNOSIS — K219 Gastro-esophageal reflux disease without esophagitis: Secondary | ICD-10-CM | POA: Insufficient documentation

## 2017-10-18 DIAGNOSIS — Z791 Long term (current) use of non-steroidal anti-inflammatories (NSAID): Secondary | ICD-10-CM | POA: Insufficient documentation

## 2017-10-18 DIAGNOSIS — Z3202 Encounter for pregnancy test, result negative: Secondary | ICD-10-CM | POA: Diagnosis not present

## 2017-10-18 DIAGNOSIS — Z800 Family history of malignant neoplasm of digestive organs: Secondary | ICD-10-CM | POA: Diagnosis not present

## 2017-10-18 DIAGNOSIS — I10 Essential (primary) hypertension: Secondary | ICD-10-CM | POA: Insufficient documentation

## 2017-10-18 DIAGNOSIS — Z823 Family history of stroke: Secondary | ICD-10-CM | POA: Diagnosis not present

## 2017-10-18 DIAGNOSIS — N920 Excessive and frequent menstruation with regular cycle: Secondary | ICD-10-CM

## 2017-10-18 DIAGNOSIS — F329 Major depressive disorder, single episode, unspecified: Secondary | ICD-10-CM | POA: Diagnosis not present

## 2017-10-18 DIAGNOSIS — Z8249 Family history of ischemic heart disease and other diseases of the circulatory system: Secondary | ICD-10-CM | POA: Insufficient documentation

## 2017-10-18 DIAGNOSIS — Z9889 Other specified postprocedural states: Secondary | ICD-10-CM | POA: Diagnosis not present

## 2017-10-18 DIAGNOSIS — R109 Unspecified abdominal pain: Secondary | ICD-10-CM | POA: Insufficient documentation

## 2017-10-18 DIAGNOSIS — Z79899 Other long term (current) drug therapy: Secondary | ICD-10-CM | POA: Diagnosis not present

## 2017-10-18 LAB — CBC
HCT: 35.6 % — ABNORMAL LOW (ref 36.0–46.0)
Hemoglobin: 11.3 g/dL — ABNORMAL LOW (ref 12.0–15.0)
MCH: 27 pg (ref 26.0–34.0)
MCHC: 31.7 g/dL (ref 30.0–36.0)
MCV: 85 fL (ref 78.0–100.0)
Platelets: 203 10*3/uL (ref 150–400)
RBC: 4.19 MIL/uL (ref 3.87–5.11)
RDW: 14.2 % (ref 11.5–15.5)
WBC: 5.7 10*3/uL (ref 4.0–10.5)

## 2017-10-18 LAB — URINALYSIS, ROUTINE W REFLEX MICROSCOPIC
Bilirubin Urine: NEGATIVE
Glucose, UA: NEGATIVE mg/dL
Ketones, ur: NEGATIVE mg/dL
Leukocytes, UA: NEGATIVE
Nitrite: NEGATIVE
Protein, ur: NEGATIVE mg/dL
Specific Gravity, Urine: 1.02 (ref 1.005–1.030)
pH: 5 (ref 5.0–8.0)

## 2017-10-18 LAB — WET PREP, GENITAL
Clue Cells Wet Prep HPF POC: NONE SEEN
Sperm: NONE SEEN
Trich, Wet Prep: NONE SEEN
Yeast Wet Prep HPF POC: NONE SEEN

## 2017-10-18 LAB — POCT PREGNANCY, URINE: Preg Test, Ur: NEGATIVE

## 2017-10-18 MED ORDER — KETOROLAC TROMETHAMINE 30 MG/ML IJ SOLN
60.0000 mg | Freq: Once | INTRAMUSCULAR | Status: AC
  Administered 2017-10-18: 60 mg via INTRAMUSCULAR
  Filled 2017-10-18: qty 2
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
## 2017-10-18 NOTE — Discharge Instructions (Signed)
Dysmenorrhea Dysmenorrhea means painful cramps during your period (menstrual period). You will have pain in your lower belly (abdomen). The pain is caused by the tightening (contracting) of the muscles of the womb (uterus). The pain may be mild or very bad. With this condition, you may:  Have a headache.  Feel sick to your stomach (nauseous).  Throw up (vomit).  Have lower back pain.  Follow these instructions at home: Helping pain and cramping  Put heat on your lower back or belly when you have pain or cramps. Use the heat source that your doctor tells you to use. ? Place a towel between your skin and the heat. ? Leave the heat on for 20-30 minutes. ? Remove the heat if your skin turns bright red. This is especially important if you cannot feel pain, heat, or cold. ? Do not have a heating pad on during sleep.  Do aerobic exercises. These include walking, swimming, or biking. These may help with cramps.  Massage your lower back or belly. This may help lessen pain. General instructions  Take over-the-counter and prescription medicines only as told by your doctor.  Do not drive or use heavy machinery while taking prescription pain medicine.  Avoid alcohol and caffeine during and right before your period. These can make cramps worse.  Do not use any products that have nicotine or tobacco. These include cigarettes and e-cigarettes. If you need help quitting, ask your doctor.  Keep all follow-up visits as told by your doctor. This is important. Contact a doctor if:  You have pain that gets worse.  You have pain that does not get better with medicine.  You have pain during sex.  You feel sick to your stomach or you throw up during your period, and medicine does not help. Get help right away if:  You pass out (faint). Summary  Dysmenorrhea means painful cramps during your period (menstrual period).  Put heat on your lower back or belly when you have pain or cramps.  Do  exercises like walking, swimming, or biking to help with cramps.  Contact a doctor if you have pain during sex. This information is not intended to replace advice given to you by your health care provider. Make sure you discuss any questions you have with your health care provider. Document Released: 01/14/2009 Document Revised: 11/04/2016 Document Reviewed: 11/04/2016 Elsevier Interactive Patient Education  2017 Elsevier Inc.  Menorrhagia Menorrhagia is when your menstrual periods are heavy or last longer than usual. Follow these instructions at home:  Only take medicine as told by your doctor.  Take any iron pills as told by your doctor. Heavy bleeding may cause low levels of iron in your body.  Do not take aspirin 1 week before or during your period. Aspirin can make the bleeding worse.  Lie down for a while if you change your tampon or pad more than once in 2 hours. This may help lessen the bleeding.  Eat a healthy diet and foods with iron. These foods include leafy green vegetables, meat, liver, eggs, and whole grain breads and cereals.  Do not try to lose weight. Wait until the heavy bleeding has stopped and your iron level is normal. Contact a doctor if:  You soak through a pad or tampon every 1 or 2 hours, and this happens every time you have a period.  You need to use pads and tampons at the same time because you are bleeding so much.  You need to change your pad  or tampon during the night.  You have a period that lasts for more than 8 days.  You pass clots bigger than 1 inch (2.5 cm) wide.  You have irregular periods that happen more or less often than once a month.  You feel dizzy or pass out (faint).  You feel very weak or tired.  You feel short of breath or feel your heart is beating too fast when you exercise.  You feel sick to your stomach (nausea) and you throw up (vomit) while you are taking your medicine.  You have watery poop (diarrhea) while you are  taking your medicine.  You have any problems that may be related to the medicine you are taking. Get help right away if:  You soak through 4 or more pads or tampons in 2 hours.  You have any bleeding while you are pregnant. This information is not intended to replace advice given to you by your health care provider. Make sure you discuss any questions you have with your health care provider. Document Released: 07/27/2008 Document Revised: 03/25/2016 Document Reviewed: 04/19/2013 Elsevier Interactive Patient Education  2017 [employer]  In late 2019, the [facility] will [facility]. At that time, the MAU (Maternity Admissions Unit), where you are being seen today, will no longer take care of non-pregnant patients. We strongly encourage you to find a doctor's office before that time, so that you can be seen with any GYN concerns, like vaginal discharge, urinary tract infection, etc.. in a timely manner.  In order to make an office visit more convenient, the Center [facility] at [facility] will be offering evening hours with same-day appointments, walk-in appointments and scheduled appointments available during this time.  Center for [facility] @ [facility] Hours: Monday - 8am - 7:30 pm with walk-in between 4pm- 7:30 pm Tuesday - 8 am - 5 pm (starting 01/31/18 we will be open late and accepting walk-ins from 4pm - 7:30pm) Wednesday - 8 am - 5 pm (starting 05/03/18 we will be open late and accepting walk-ins from 4pm - 7:30pm) Thursday 8 am - 5 pm (starting 08/03/18 we will be open late and accepting walk-ins from 4pm - 7:30pm) Friday 8 am - 5 pm  For an appointment please call the [facility] @ [facility] at [phone]  For urgent needs, [facility] is also available for management of urgent GYN complaints such as vaginal discharge or urinary tract infections.

## 2017-10-18 NOTE — MAU Note (Signed)
Pt presents to MAU with c/o of vaginal bleeding and abdominal cramping that started yesterday. Pt has not had confirmed HPT but has felt nauseous. LMP-09/05/2017

## 2017-10-19 LAB — GC/CHLAMYDIA PROBE AMP (~~LOC~~) NOT AT ARMC
Chlamydia: NEGATIVE
Neisseria Gonorrhea: NEGATIVE

## 2017-11-10 ENCOUNTER — Other Ambulatory Visit: Payer: Self-pay

## 2017-11-10 ENCOUNTER — Emergency Department: Payer: Medicaid Other

## 2017-11-10 ENCOUNTER — Ambulatory Visit (INDEPENDENT_AMBULATORY_CARE_PROVIDER_SITE_OTHER): Payer: Self-pay | Admitting: Orthopaedic Surgery

## 2017-11-10 ENCOUNTER — Emergency Department
Admission: EM | Admit: 2017-11-10 | Discharge: 2017-11-11 | Disposition: A | Discharge status: Home / Self Care | Payer: Medicaid Other | Attending: Emergency Medicine | Admitting: Emergency Medicine

## 2017-11-10 ENCOUNTER — Encounter: Payer: Self-pay | Admitting: Emergency Medicine

## 2017-11-10 DIAGNOSIS — I10 Essential (primary) hypertension: Secondary | ICD-10-CM | POA: Diagnosis not present

## 2017-11-10 DIAGNOSIS — F329 Major depressive disorder, single episode, unspecified: Secondary | ICD-10-CM | POA: Diagnosis not present

## 2017-11-10 DIAGNOSIS — Z79899 Other long term (current) drug therapy: Secondary | ICD-10-CM | POA: Insufficient documentation

## 2017-11-10 DIAGNOSIS — N83202 Unspecified ovarian cyst, left side: Secondary | ICD-10-CM

## 2017-11-10 DIAGNOSIS — R109 Unspecified abdominal pain: Secondary | ICD-10-CM | POA: Diagnosis not present

## 2017-11-10 DIAGNOSIS — F411 Generalized anxiety disorder: Secondary | ICD-10-CM | POA: Diagnosis not present

## 2017-11-10 LAB — COMPREHENSIVE METABOLIC PANEL
ALT: 39 U/L (ref 14–54)
AST: 33 U/L (ref 15–41)
Albumin: 4 g/dL (ref 3.5–5.0)
Alkaline Phosphatase: 66 U/L (ref 38–126)
Anion gap: 9 (ref 5–15)
BUN: 10 mg/dL (ref 6–20)
CO2: 21 mmol/L — ABNORMAL LOW (ref 22–32)
Calcium: 9.1 mg/dL (ref 8.9–10.3)
Chloride: 105 mmol/L (ref 101–111)
Creatinine, Ser: 0.58 mg/dL (ref 0.44–1.00)
GFR calc Af Amer: >60 mL/min (ref >60)
GFR calc non Af Amer: >60 mL/min (ref >60)
Glucose, Bld: 122 mg/dL — ABNORMAL HIGH (ref 65–99)
Potassium: 4 mmol/L (ref 3.5–5.1)
Sodium: 135 mmol/L (ref 135–145)
Total Bilirubin: 0.4 mg/dL (ref 0.3–1.2)
Total Protein: 7.7 g/dL (ref 6.5–8.1)

## 2017-11-10 LAB — LIPASE, BLOOD: Lipase: 28 U/L (ref 11–51)

## 2017-11-10 LAB — CBC
HCT: 37.3 % (ref 35.0–47.0)
Hemoglobin: 12.4 g/dL (ref 12.0–16.0)
MCH: 26.1 pg (ref 26.0–34.0)
MCHC: 33.3 g/dL (ref 32.0–36.0)
MCV: 78.6 fL — ABNORMAL LOW (ref 80.0–100.0)
Platelets: 254 10*3/uL (ref 150–440)
RBC: 4.75 MIL/uL (ref 3.80–5.20)
RDW: 15 % — ABNORMAL HIGH (ref 11.5–14.5)
WBC: 8.2 10*3/uL (ref 3.6–11.0)

## 2017-11-10 MED ORDER — IOPAMIDOL (ISOVUE-300) INJECTION 61%
100.0000 mL | Freq: Once | INTRAVENOUS | Status: AC | PRN
  Administered 2017-11-10: 100 mL via INTRAVENOUS

## 2017-11-10 MED ORDER — SODIUM CHLORIDE 0.9 % IV BOLUS (SEPSIS)
1000.0000 mL | Freq: Once | INTRAVENOUS | Status: AC
  Administered 2017-11-10: 1000 mL via INTRAVENOUS

## 2017-11-10 MED ORDER — FENTANYL CITRATE (PF) 100 MCG/2ML IJ SOLN
50.0000 ug | Freq: Once | INTRAMUSCULAR | Status: AC
  Administered 2017-11-10: 50 ug via INTRAVENOUS
  Filled 2017-11-10: qty 2

## 2017-11-10 MED ORDER — ONDANSETRON 4 MG PO TBDP
4.0000 mg | ORAL_TABLET | Freq: Once | ORAL | Status: AC | PRN
  Administered 2017-11-10: 4 mg via ORAL
  Filled 2017-11-10: qty 1

## 2017-11-10 MED ORDER — IOPAMIDOL (ISOVUE-300) INJECTION 61%: 30.0000 mL | Freq: Once | INTRAVENOUS | Status: DC

## 2017-11-10 MED ORDER — ONDANSETRON HCL 4 MG/2ML IJ SOLN
4.0000 mg | Freq: Once | INTRAMUSCULAR | Status: AC
Start: 2017-11-10 — End: 2017-11-10
  Administered 2017-11-10: 4 mg via INTRAVENOUS
  Filled 2017-11-10: qty 2

## 2017-11-10 NOTE — ED Triage Notes (Signed)
Pt to ED via [facility] from home for abd pain and vomiting that started yesterday. Pt states that she had diarrhea yesterday but no today. Pt denies fever but states that she has had chills. Pt in NAD at this time. Pt denies sick contacts
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
## 2017-11-10 NOTE — ED Notes (Signed)
First nurse note: Pt arrived via [facility] from home for reports abd pain for two days. EMS reports VSS.

## 2017-11-11 ENCOUNTER — Emergency Department: Payer: Medicaid Other

## 2017-11-11 LAB — URINALYSIS, COMPLETE (UACMP) WITH MICROSCOPIC
Bilirubin Urine: NEGATIVE
Glucose, UA: NEGATIVE mg/dL
Hgb urine dipstick: NEGATIVE
Ketones, ur: 40 mg/dL — AB
Leukocytes, UA: NEGATIVE
Nitrite: NEGATIVE
Protein, ur: NEGATIVE mg/dL
Specific Gravity, Urine: 1.02 (ref 1.005–1.030)
pH: 6.5 (ref 5.0–8.0)

## 2017-11-11 LAB — URINE DRUG SCREEN, QUALITATIVE (ARMC ONLY)
Amphetamines, Ur Screen: NOT DETECTED
Barbiturates, Ur Screen: NOT DETECTED
Benzodiazepine, Ur Scrn: POSITIVE — AB
Cannabinoid 50 Ng, Ur ~~LOC~~: POSITIVE — AB
Cocaine Metabolite,Ur ~~LOC~~: NOT DETECTED
MDMA (Ecstasy)Ur Screen: NOT DETECTED
Methadone Scn, Ur: NOT DETECTED
Opiate, Ur Screen: POSITIVE — AB
Phencyclidine (PCP) Ur S: NOT DETECTED
Tricyclic, Ur Screen: NOT DETECTED

## 2017-11-11 LAB — POCT PREGNANCY, URINE: Preg Test, Ur: NEGATIVE

## 2017-11-11 MED ORDER — ONDANSETRON HCL 4 MG/2ML IJ SOLN
4.0000 mg | Freq: Once | INTRAMUSCULAR | Status: AC
  Administered 2017-11-11: 4 mg via INTRAVENOUS

## 2017-11-11 MED ORDER — OXYCODONE-ACETAMINOPHEN 5-325 MG PO TABS: 1.0000 | ORAL_TABLET | Freq: Once | ORAL | Status: DC

## 2017-11-11 MED ORDER — HYDROMORPHONE HCL 1 MG/ML IJ SOLN
0.5000 mg | Freq: Once | INTRAMUSCULAR | Status: AC
  Administered 2017-11-11: 0.5 mg via INTRAVENOUS
  Filled 2017-11-11: qty 1

## 2017-11-11 MED ORDER — OXYCODONE-ACETAMINOPHEN 5-325 MG PO TABS
ORAL_TABLET | ORAL | Status: AC
  Filled 2017-11-11: qty 1

## 2017-11-11 MED ORDER — HYDROMORPHONE HCL 1 MG/ML IJ SOLN
INTRAMUSCULAR | Status: AC
  Administered 2017-11-11: 1 mg via INTRAVENOUS
  Filled 2017-11-11: qty 1

## 2017-11-11 MED ORDER — PROMETHAZINE HCL 25 MG PO TABS: 25.0000 mg | ORAL_TABLET | Freq: Four times a day (QID) | ORAL | 0 refills | Status: DC | PRN

## 2017-11-11 MED ORDER — HYDROMORPHONE HCL 1 MG/ML IJ SOLN
1.0000 mg | Freq: Once | INTRAMUSCULAR | Status: AC
  Administered 2017-11-11: 1 mg via INTRAVENOUS

## 2017-11-11 MED ORDER — ONDANSETRON HCL 4 MG/2ML IJ SOLN
INTRAMUSCULAR | Status: AC
  Filled 2017-11-11: qty 2

## 2017-11-11 MED ORDER — DICYCLOMINE HCL 20 MG PO TABS: 20.0000 mg | ORAL_TABLET | Freq: Three times a day (TID) | ORAL | 0 refills | Status: DC | PRN

## 2017-11-11 NOTE — ED Notes (Signed)
Esignature pad not working. Patient verbalized understanding of all instructions. Patient wheeled out to car by this RN

## 2017-11-11 NOTE — ED Notes (Signed)
Reviewed discharge instructions, follow-up care, and prescriptions with patient. Patient verbalized understanding of all information reviewed. Patient stable, with no distress noted at this time.    

## 2017-11-12 ENCOUNTER — Emergency Department (HOSPITAL_COMMUNITY)
Admission: EM | Admit: 2017-11-12 | Discharge: 2017-11-12 | Disposition: A | Discharge status: Home / Self Care | Payer: Medicaid Other | Attending: Emergency Medicine | Admitting: Emergency Medicine

## 2017-11-12 ENCOUNTER — Encounter (HOSPITAL_COMMUNITY): Payer: Self-pay

## 2017-11-12 ENCOUNTER — Other Ambulatory Visit: Payer: Self-pay

## 2017-11-12 ENCOUNTER — Emergency Department (HOSPITAL_COMMUNITY): Payer: Medicaid Other

## 2017-11-12 DIAGNOSIS — I10 Essential (primary) hypertension: Secondary | ICD-10-CM | POA: Diagnosis not present

## 2017-11-12 DIAGNOSIS — R197 Diarrhea, unspecified: Secondary | ICD-10-CM | POA: Insufficient documentation

## 2017-11-12 DIAGNOSIS — Z79899 Other long term (current) drug therapy: Secondary | ICD-10-CM | POA: Insufficient documentation

## 2017-11-12 DIAGNOSIS — R079 Chest pain, unspecified: Secondary | ICD-10-CM

## 2017-11-12 DIAGNOSIS — Z885 Allergy status to narcotic agent status: Secondary | ICD-10-CM | POA: Insufficient documentation

## 2017-11-12 DIAGNOSIS — R112 Nausea with vomiting, unspecified: Secondary | ICD-10-CM | POA: Diagnosis not present

## 2017-11-12 DIAGNOSIS — R1013 Epigastric pain: Secondary | ICD-10-CM | POA: Diagnosis not present

## 2017-11-12 DIAGNOSIS — R1012 Left upper quadrant pain: Secondary | ICD-10-CM | POA: Insufficient documentation

## 2017-11-12 DIAGNOSIS — R0602 Shortness of breath: Secondary | ICD-10-CM | POA: Diagnosis not present

## 2017-11-12 LAB — URINALYSIS, ROUTINE W REFLEX MICROSCOPIC
Bilirubin Urine: NEGATIVE
Glucose, UA: NEGATIVE mg/dL
Hgb urine dipstick: NEGATIVE
Ketones, ur: 20 mg/dL — AB
Leukocytes, UA: NEGATIVE
Nitrite: NEGATIVE
Protein, ur: 30 mg/dL — AB
Specific Gravity, Urine: 1.025 (ref 1.005–1.030)
pH: 5 (ref 5.0–8.0)

## 2017-11-12 LAB — COMPREHENSIVE METABOLIC PANEL
ALT: 39 U/L (ref 14–54)
AST: 26 U/L (ref 15–41)
Albumin: 4.2 g/dL (ref 3.5–5.0)
Alkaline Phosphatase: 67 U/L (ref 38–126)
Anion gap: 14 (ref 5–15)
BUN: 11 mg/dL (ref 6–20)
CO2: 20 mmol/L — ABNORMAL LOW (ref 22–32)
Calcium: 9.6 mg/dL (ref 8.9–10.3)
Chloride: 98 mmol/L — ABNORMAL LOW (ref 101–111)
Creatinine, Ser: 0.72 mg/dL (ref 0.44–1.00)
GFR calc Af Amer: >60 mL/min (ref >60)
GFR calc non Af Amer: >60 mL/min (ref >60)
Glucose, Bld: 104 mg/dL — ABNORMAL HIGH (ref 65–99)
Potassium: 3.6 mmol/L (ref 3.5–5.1)
Sodium: 132 mmol/L — ABNORMAL LOW (ref 135–145)
Total Bilirubin: 1 mg/dL (ref 0.3–1.2)
Total Protein: 8.1 g/dL (ref 6.5–8.1)

## 2017-11-12 LAB — CBC
HCT: 40.7 % (ref 36.0–46.0)
Hemoglobin: 13.9 g/dL (ref 12.0–15.0)
MCH: 27.3 pg (ref 26.0–34.0)
MCHC: 34.2 g/dL (ref 30.0–36.0)
MCV: 80 fL (ref 78.0–100.0)
Platelets: 303 10*3/uL (ref 150–400)
RBC: 5.09 MIL/uL (ref 3.87–5.11)
RDW: 13.6 % (ref 11.5–15.5)
WBC: 10 10*3/uL (ref 4.0–10.5)

## 2017-11-12 LAB — I-STAT BETA HCG BLOOD, ED (MC, WL, AP ONLY): I-stat hCG, quantitative: <5 m[IU]/mL (ref <5)

## 2017-11-12 LAB — I-STAT TROPONIN, ED: Troponin i, poc: 0 ng/mL (ref 0.00–0.08)

## 2017-11-12 LAB — LIPASE, BLOOD: Lipase: 29 U/L (ref 11–51)

## 2017-11-12 MED ORDER — ONDANSETRON 4 MG PO TBDP: 4.0000 mg | ORAL_TABLET | Freq: Three times a day (TID) | ORAL | 0 refills | Status: DC | PRN

## 2017-11-12 MED ORDER — DICYCLOMINE HCL 10 MG PO CAPS
10.0000 mg | ORAL_CAPSULE | Freq: Once | ORAL | Status: AC
  Administered 2017-11-12: 10 mg via ORAL
  Filled 2017-11-12: qty 1

## 2017-11-12 MED ORDER — SODIUM CHLORIDE 0.9 % IV BOLUS (SEPSIS)
500.0000 mL | Freq: Once | INTRAVENOUS | Status: AC
  Administered 2017-11-12: 500 mL via INTRAVENOUS

## 2017-11-12 MED ORDER — PROMETHAZINE HCL 25 MG RE SUPP: 25.0000 mg | Freq: Three times a day (TID) | RECTAL | 0 refills | Status: DC | PRN

## 2017-11-12 MED ORDER — ONDANSETRON 4 MG PO TBDP
4.0000 mg | ORAL_TABLET | Freq: Once | ORAL | Status: AC
  Administered 2017-11-12: 4 mg via ORAL
  Filled 2017-11-12: qty 1

## 2017-11-12 MED ORDER — ONDANSETRON HCL 4 MG/2ML IJ SOLN
4.0000 mg | Freq: Once | INTRAMUSCULAR | Status: AC
  Administered 2017-11-12: 4 mg via INTRAVENOUS
  Filled 2017-11-12: qty 2

## 2017-11-12 NOTE — ED Notes (Signed)
Pt discharged from ED; instructions provided and scripts given; Pt encouraged to return to ED if symptoms worsen and to f/u with PCP; Pt verbalized understanding of all instructions 

## 2017-11-12 NOTE — ED Triage Notes (Signed)
[facility] seen at [facility] days ago for abd pain, chest pain, vomiting, diarrhea.  Was prescribed Phenergan and Bentyl, taking with no relief.

## 2017-11-12 NOTE — ED Notes (Signed)
Patient transported to X-ray 
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
## 2017-11-12 NOTE — Discharge Instructions (Addendum)
You were given a prescription for Zofran and phenergan which you may take as needed every 8 hours. Please follow up with your primary care doctor within 3 days, and please return to the ER if you are having any persistent vomiting, abdominal pain, chest pain, shortness of breath, or any new or worsening symptoms.

## 2017-11-12 NOTE — ED Notes (Signed)
Pt bk from Xray 

## 2017-11-22 ENCOUNTER — Ambulatory Visit (INDEPENDENT_AMBULATORY_CARE_PROVIDER_SITE_OTHER): Payer: Medicaid Other | Admitting: Orthopaedic Surgery

## 2017-11-24 ENCOUNTER — Ambulatory Visit (INDEPENDENT_AMBULATORY_CARE_PROVIDER_SITE_OTHER): Payer: Medicaid Other | Admitting: Orthopaedic Surgery

## 2017-11-29 ENCOUNTER — Encounter (INDEPENDENT_AMBULATORY_CARE_PROVIDER_SITE_OTHER): Payer: Self-pay | Admitting: Orthopaedic Surgery

## 2017-11-29 ENCOUNTER — Ambulatory Visit (INDEPENDENT_AMBULATORY_CARE_PROVIDER_SITE_OTHER): Payer: Medicaid Other | Admitting: Orthopaedic Surgery

## 2017-11-29 ENCOUNTER — Ambulatory Visit (INDEPENDENT_AMBULATORY_CARE_PROVIDER_SITE_OTHER): Payer: Medicaid Other

## 2017-11-29 DIAGNOSIS — M79642 Pain in left hand: Secondary | ICD-10-CM

## 2017-11-29 DIAGNOSIS — M79641 Pain in right hand: Secondary | ICD-10-CM

## 2017-11-29 DIAGNOSIS — G5601 Carpal tunnel syndrome, right upper limb: Secondary | ICD-10-CM | POA: Diagnosis not present

## 2017-11-29 DIAGNOSIS — G5602 Carpal tunnel syndrome, left upper limb: Secondary | ICD-10-CM | POA: Diagnosis not present
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
## 2017-12-02 ENCOUNTER — Encounter: Payer: Self-pay | Admitting: Emergency Medicine

## 2017-12-02 ENCOUNTER — Other Ambulatory Visit: Payer: Self-pay

## 2017-12-02 ENCOUNTER — Emergency Department
Admission: EM | Admit: 2017-12-02 | Discharge: 2017-12-02 | Disposition: A | Discharge status: Home / Self Care | Payer: Medicaid Other | Attending: Emergency Medicine | Admitting: Emergency Medicine

## 2017-12-02 DIAGNOSIS — Z885 Allergy status to narcotic agent status: Secondary | ICD-10-CM | POA: Insufficient documentation

## 2017-12-02 DIAGNOSIS — R112 Nausea with vomiting, unspecified: Secondary | ICD-10-CM | POA: Diagnosis not present

## 2017-12-02 DIAGNOSIS — R1084 Generalized abdominal pain: Secondary | ICD-10-CM | POA: Diagnosis not present

## 2017-12-02 DIAGNOSIS — Z79899 Other long term (current) drug therapy: Secondary | ICD-10-CM | POA: Insufficient documentation

## 2017-12-02 DIAGNOSIS — I10 Essential (primary) hypertension: Secondary | ICD-10-CM | POA: Diagnosis not present

## 2017-12-02 LAB — COMPREHENSIVE METABOLIC PANEL
ALT: 47 U/L (ref 14–54)
AST: 31 U/L (ref 15–41)
Albumin: 4.2 g/dL (ref 3.5–5.0)
Alkaline Phosphatase: 62 U/L (ref 38–126)
Anion gap: 9 (ref 5–15)
BUN: 17 mg/dL (ref 6–20)
CO2: 23 mmol/L (ref 22–32)
Calcium: 9.5 mg/dL (ref 8.9–10.3)
Chloride: 103 mmol/L (ref 101–111)
Creatinine, Ser: 0.84 mg/dL (ref 0.44–1.00)
GFR calc Af Amer: >60 mL/min (ref >60)
GFR calc non Af Amer: >60 mL/min (ref >60)
Glucose, Bld: 123 mg/dL — ABNORMAL HIGH (ref 65–99)
Potassium: 3.9 mmol/L (ref 3.5–5.1)
Sodium: 135 mmol/L (ref 135–145)
Total Bilirubin: 0.8 mg/dL (ref 0.3–1.2)
Total Protein: 8 g/dL (ref 6.5–8.1)

## 2017-12-02 LAB — CBC
HCT: 39.4 % (ref 35.0–47.0)
Hemoglobin: 13 g/dL (ref 12.0–16.0)
MCH: 26.6 pg (ref 26.0–34.0)
MCHC: 32.9 g/dL (ref 32.0–36.0)
MCV: 80.7 fL (ref 80.0–100.0)
Platelets: 238 10*3/uL (ref 150–440)
RBC: 4.89 MIL/uL (ref 3.80–5.20)
RDW: 15.7 % — ABNORMAL HIGH (ref 11.5–14.5)
WBC: 9.3 10*3/uL (ref 3.6–11.0)

## 2017-12-02 LAB — URINALYSIS, COMPLETE (UACMP) WITH MICROSCOPIC
Bacteria, UA: NONE SEEN
Bilirubin Urine: NEGATIVE
Glucose, UA: NEGATIVE mg/dL
Hgb urine dipstick: NEGATIVE
Ketones, ur: 5 mg/dL — AB
Leukocytes, UA: NEGATIVE
Nitrite: NEGATIVE
Protein, ur: 30 mg/dL — AB
Specific Gravity, Urine: 1.026 (ref 1.005–1.030)
pH: 5 (ref 5.0–8.0)

## 2017-12-02 LAB — LIPASE, BLOOD: Lipase: 26 U/L (ref 11–51)

## 2017-12-02 MED ORDER — CAPSAICIN 0.025 % EX CREA
TOPICAL_CREAM | Freq: Two times a day (BID) | CUTANEOUS | Status: DC
  Administered 2017-12-02: 17:00:00 via TOPICAL
  Filled 2017-12-02: qty 60

## 2017-12-02 MED ORDER — KETOROLAC TROMETHAMINE 10 MG PO TABS: 10.0000 mg | ORAL_TABLET | Freq: Three times a day (TID) | ORAL | 0 refills | Status: DC | PRN

## 2017-12-02 MED ORDER — KETOROLAC TROMETHAMINE 30 MG/ML IJ SOLN
30.0000 mg | Freq: Once | INTRAMUSCULAR | Status: AC
  Administered 2017-12-02: 30 mg via INTRAVENOUS
  Filled 2017-12-02: qty 1

## 2017-12-02 MED ORDER — ONDANSETRON HCL 4 MG/2ML IJ SOLN
4.0000 mg | Freq: Once | INTRAMUSCULAR | Status: AC | PRN
  Administered 2017-12-02: 4 mg via INTRAVENOUS
  Filled 2017-12-02: qty 2

## 2017-12-02 MED ORDER — ONDANSETRON 4 MG PO TBDP
ORAL_TABLET | ORAL | Status: AC
  Filled 2017-12-02: qty 1

## 2017-12-02 MED ORDER — KETOROLAC TROMETHAMINE 30 MG/ML IJ SOLN
INTRAMUSCULAR | Status: AC
  Filled 2017-12-02: qty 1
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
## 2017-12-02 NOTE — ED Notes (Signed)
Pharmacy notified to send Capsaicin cream.

## 2017-12-02 NOTE — Discharge Instructions (Signed)
Please seek medical attention for any high fevers, chest pain, shortness of breath, change in behavior, persistent vomiting, bloody stool or any other new or concerning symptoms.  

## 2017-12-02 NOTE — ED Triage Notes (Signed)
Pt in via POV with complaints of abdominal pain and cyclic emesis x 2 days.  Pt recently admitted to hospital for same, unable to tell me what her diagnosis was.  Pt vomiting in triage.

## 2017-12-02 NOTE — ED Notes (Signed)
Py presents today with n/v started ago at christmas. Has gotten worse the last 3 days.

## 2017-12-15 ENCOUNTER — Encounter (INDEPENDENT_AMBULATORY_CARE_PROVIDER_SITE_OTHER): Payer: Self-pay | Admitting: Physical Medicine and Rehabilitation

## 2017-12-25 ENCOUNTER — Other Ambulatory Visit: Payer: Self-pay

## 2017-12-25 ENCOUNTER — Encounter (HOSPITAL_COMMUNITY): Payer: Self-pay | Admitting: Emergency Medicine

## 2017-12-25 ENCOUNTER — Encounter (HOSPITAL_COMMUNITY): Payer: Self-pay

## 2017-12-25 ENCOUNTER — Ambulatory Visit (HOSPITAL_COMMUNITY)
Admission: EM | Admit: 2017-12-25 | Discharge: 2017-12-25 | Disposition: A | Discharge status: Home / Self Care | Payer: Medicaid Other

## 2017-12-25 DIAGNOSIS — I10 Essential (primary) hypertension: Secondary | ICD-10-CM | POA: Insufficient documentation

## 2017-12-25 DIAGNOSIS — G43A0 Cyclical vomiting, not intractable: Secondary | ICD-10-CM | POA: Insufficient documentation

## 2017-12-25 DIAGNOSIS — G43A1 Cyclical vomiting, intractable: Secondary | ICD-10-CM

## 2017-12-25 DIAGNOSIS — R682 Dry mouth, unspecified: Secondary | ICD-10-CM

## 2017-12-25 DIAGNOSIS — R1084 Generalized abdominal pain: Secondary | ICD-10-CM | POA: Diagnosis present

## 2017-12-25 DIAGNOSIS — Z79899 Other long term (current) drug therapy: Secondary | ICD-10-CM | POA: Diagnosis not present

## 2017-12-25 DIAGNOSIS — R1115 Cyclical vomiting syndrome unrelated to migraine: Secondary | ICD-10-CM

## 2017-12-25 NOTE — ED Triage Notes (Signed)
Pt states that she has been n/v and pain for the past 4-5 days. Last BM four days ago, states she tried to go see her GI doc and unable to get in. Hx of the same

## 2017-12-25 NOTE — Discharge Instructions (Signed)
Go directly to [facility] for further evaluation and treatment.

## 2017-12-25 NOTE — ED Triage Notes (Signed)
Pt reports back and abdominal pain and vomiting 20-30 times a day for four days.  She states she noticed what looks like a bite on the back of her right hand and behind her left ear, today.

## 2017-12-25 NOTE — ED Provider Notes (Signed)
12/25/2017 8:28 PM   DOB: [birth year] / MRN: [id number]  SUBJECTIVE:  [name] is a 32 y.o. female presenting for nausea and emesis.  She has a long history of this and this tends to flare and remiss.  She tells me for the last 3 days she has not been able to hold down any p.o. liquids and solids.  She has tried Zofran p.o., Phenergan p.o. as well as rectal Phenergan.  She tells me that she sees Dr. Dulce Sellar in GI and no diagnosis has been made yet.  His symptoms have been going on for years.  He she does complain of dry mouth at this time and her last urination was roughly 4 hours ago and scant.  She denies dysuria.  She is allergic to morphine and related.   She  has a past medical history of Anxiety, Depression, Foot pain, Gastritis (4098,1191), GERD (gastroesophageal reflux disease), Heartburn (anxiety), Hypertension, and Seasonal allergies.    She  reports that  has never smoked. she has never used smokeless tobacco. She reports that she drinks alcohol. She reports that she does not use drugs. She  reports that she currently engages in sexual activity. She reports using the following method of birth control/protection: Condom. The patient  has a past surgical history that includes Foot surgery; Wrist surgery (Right); Breast biopsy (Right, 05/03/13); Appendectomy (N/A, 05/25/2015); Esophagogastroduodenoscopy (egd) with propofol (N/A, 04/21/2017); and Colonoscopy with propofol (N/A, 07/06/2017).  Her family history includes Diabetes in her father and maternal grandmother; Heart failure in her maternal grandmother; Hypertension in her father; Kidney failure in her maternal grandmother; Liver cancer in her paternal grandfather; Lung cancer in her maternal grandfather; Stroke in her maternal grandmother and paternal grandmother.  Review of Systems  Constitutional: Negative for chills, diaphoresis and fever.  Respiratory: Negative for cough, hemoptysis, sputum production, shortness of breath and  wheezing.   Cardiovascular: Negative for chest pain, orthopnea and leg swelling.  Gastrointestinal: Positive for diarrhea and vomiting. Negative for abdominal pain, heartburn and nausea.  Genitourinary: Negative for dysuria, flank pain, frequency, hematuria and urgency.  Skin: Negative for rash.  Neurological: Negative for dizziness.    OBJECTIVE:  BP (!) 170/113 (BP Location: Left Wrist)   Pulse (!) 115   Temp 98.4 F (36.9 C) (Oral)   LMP 12/11/2017 (Exact Date)   SpO2 100%   Physical Exam  Constitutional: She is oriented to person, place, and time. She is active.  Non-toxic appearance.  Cardiovascular: Normal rate, regular rhythm, S1 normal, S2 normal, normal heart sounds and intact distal pulses. Exam reveals no gallop, no friction rub and no decreased pulses.  No murmur heard. Pulmonary/Chest: Effort normal. No stridor. No tachypnea. No respiratory distress. She has no wheezes. She has no rales.  Abdominal: Soft. Bowel sounds are normal. She exhibits no distension and no mass. There is no tenderness. There is no rebound and no guarding.  Musculoskeletal: She exhibits no edema or tenderness.  Neurological: She is alert and oriented to person, place, and time.  Skin: Skin is warm and dry. She is not diaphoretic. No pallor.    No results found for this or any previous visit (from the past 72 hour(s)).  No results found.  ASSESSMENT AND PLAN:    Intractable cyclical vomiting with nausea: She cannot hold down any fluids despite Zofran, Phenergan and Phenergan suppositories.  She cannot take her medication due to the symptoms.  She has a history of acute kidney injury secondary to a similar episode.  Of advised that she go to the ED for hydration and monitoring.  Patient agrees to cath.   Dry mouth      The patient is advised to call or return to clinic if she does not see an improvement in symptoms, or to seek the care of the closest emergency department if she worsens  with the above plan.   Deliah BostonMichael Helene Bernstein, MHS, PA-C 12/25/2017 8:28 PM    Ofilia Neaslark, Jahkeem Kurka L, PA-C 12/25/17 2031

## 2017-12-25 NOTE — ED Notes (Signed)
Pt sent to the ER by Casimiro NeedleMichael PA

## 2017-12-26 ENCOUNTER — Emergency Department (HOSPITAL_COMMUNITY)
Admission: EM | Admit: 2017-12-26 | Discharge: 2017-12-26 | Disposition: A | Discharge status: Home / Self Care | Payer: Medicaid Other | Attending: Emergency Medicine | Admitting: Emergency Medicine

## 2017-12-26 DIAGNOSIS — R1115 Cyclical vomiting syndrome unrelated to migraine: Secondary | ICD-10-CM

## 2017-12-26 LAB — COMPREHENSIVE METABOLIC PANEL
ALT: 42 U/L (ref 14–54)
AST: 37 U/L (ref 15–41)
Albumin: 4.5 g/dL (ref 3.5–5.0)
Alkaline Phosphatase: 64 U/L (ref 38–126)
Anion gap: 13 (ref 5–15)
BUN: 10 mg/dL (ref 6–20)
CO2: 26 mmol/L (ref 22–32)
Calcium: 9.7 mg/dL (ref 8.9–10.3)
Chloride: 91 mmol/L — ABNORMAL LOW (ref 101–111)
Creatinine, Ser: 0.9 mg/dL (ref 0.44–1.00)
GFR calc Af Amer: >60 mL/min (ref >60)
GFR calc non Af Amer: >60 mL/min (ref >60)
Glucose, Bld: 135 mg/dL — ABNORMAL HIGH (ref 65–99)
Potassium: 2.8 mmol/L — ABNORMAL LOW (ref 3.5–5.1)
Sodium: 130 mmol/L — ABNORMAL LOW (ref 135–145)
Total Bilirubin: 1.1 mg/dL (ref 0.3–1.2)
Total Protein: 8.3 g/dL — ABNORMAL HIGH (ref 6.5–8.1)

## 2017-12-26 LAB — CBC
HCT: 41.5 % (ref 36.0–46.0)
Hemoglobin: 13.4 g/dL (ref 12.0–15.0)
MCH: 25.6 pg — ABNORMAL LOW (ref 26.0–34.0)
MCHC: 32.3 g/dL (ref 30.0–36.0)
MCV: 79.2 fL (ref 78.0–100.0)
Platelets: 312 10*3/uL (ref 150–400)
RBC: 5.24 MIL/uL — ABNORMAL HIGH (ref 3.87–5.11)
RDW: 14 % (ref 11.5–15.5)
WBC: 10.6 10*3/uL — ABNORMAL HIGH (ref 4.0–10.5)

## 2017-12-26 LAB — URINALYSIS, ROUTINE W REFLEX MICROSCOPIC
Bilirubin Urine: NEGATIVE
Glucose, UA: NEGATIVE mg/dL
Hgb urine dipstick: NEGATIVE
Ketones, ur: 5 mg/dL — AB
Leukocytes, UA: NEGATIVE
Nitrite: NEGATIVE
Protein, ur: 30 mg/dL — AB
Specific Gravity, Urine: 1.025 (ref 1.005–1.030)
pH: 5 (ref 5.0–8.0)

## 2017-12-26 LAB — RAPID URINE DRUG SCREEN, HOSP PERFORMED
Amphetamines: NOT DETECTED
Barbiturates: NOT DETECTED
Benzodiazepines: NOT DETECTED
Cocaine: NOT DETECTED
Opiates: POSITIVE — AB
Tetrahydrocannabinol: POSITIVE — AB

## 2017-12-26 LAB — I-STAT BETA HCG BLOOD, ED (MC, WL, AP ONLY): I-stat hCG, quantitative: <5 m[IU]/mL (ref <5)

## 2017-12-26 LAB — LIPASE, BLOOD: Lipase: 28 U/L (ref 11–51)

## 2017-12-26 MED ORDER — HALOPERIDOL LACTATE 5 MG/ML IJ SOLN
2.0000 mg | Freq: Once | INTRAMUSCULAR | Status: AC
  Administered 2017-12-26: 2 mg via INTRAVENOUS
  Filled 2017-12-26: qty 1

## 2017-12-26 MED ORDER — PROMETHAZINE HCL 25 MG/ML IJ SOLN
25.0000 mg | Freq: Once | INTRAMUSCULAR | Status: AC
  Administered 2017-12-26: 25 mg via INTRAVENOUS
  Filled 2017-12-26: qty 1

## 2017-12-26 MED ORDER — SODIUM CHLORIDE 0.9 % IV BOLUS (SEPSIS)
1000.0000 mL | Freq: Once | INTRAVENOUS | Status: AC
  Administered 2017-12-26: 1000 mL via INTRAVENOUS

## 2017-12-26 MED ORDER — DIPHENHYDRAMINE HCL 50 MG/ML IJ SOLN
25.0000 mg | Freq: Once | INTRAMUSCULAR | Status: AC
  Administered 2017-12-26: 25 mg via INTRAVENOUS
  Filled 2017-12-26: qty 1

## 2017-12-26 MED ORDER — KETOROLAC TROMETHAMINE 30 MG/ML IJ SOLN
30.0000 mg | Freq: Once | INTRAMUSCULAR | Status: AC
  Administered 2017-12-26: 30 mg via INTRAVENOUS
  Filled 2017-12-26: qty 1

## 2017-12-26 MED ORDER — POTASSIUM CHLORIDE CRYS ER 20 MEQ PO TBCR
60.0000 meq | EXTENDED_RELEASE_TABLET | Freq: Once | ORAL | Status: AC
  Administered 2017-12-26: 60 meq via ORAL
  Filled 2017-12-26: qty 3

## 2017-12-26 NOTE — Discharge Instructions (Signed)
It was my pleasure taking care of you today!   It is very important that you keep your appointment with the GI doctor.   Continue using your Bentyl as needed for abdominal pain and zofran or phenergan as needed for nausea/vomiting.  Increase hydration with small sips of fluids.   Return to the ER if you keep throwing up and can't keep fluids down despite medications, notice blood in your stool, new or worsening symptoms develop or if you have any additional concerns.

## 2017-12-26 NOTE — ED Notes (Signed)
Pt remains in waiting room. Updated on wait for treatment room. 

## 2017-12-26 NOTE — ED Notes (Signed)
Lab to add on UDS to urine specimen.

## 2017-12-26 NOTE — ED Notes (Signed)
ED Provider at bedside. 
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
## 2017-12-26 NOTE — ED Notes (Signed)
Pt reports itching to hand, ear and neck. Pt reports she believes she was bit by something. Marijean NiemannJaime, GeorgiaPA notified.

## 2018-01-09 ENCOUNTER — Other Ambulatory Visit (HOSPITAL_COMMUNITY): Payer: Self-pay | Admitting: Gastroenterology

## 2018-01-09 DIAGNOSIS — R112 Nausea with vomiting, unspecified: Secondary | ICD-10-CM

## 2018-01-12 ENCOUNTER — Encounter (HOSPITAL_COMMUNITY): Payer: Medicaid Other

## 2018-01-23 ENCOUNTER — Encounter (HOSPITAL_COMMUNITY): Payer: Self-pay | Admitting: Emergency Medicine

## 2018-01-23 ENCOUNTER — Encounter (HOSPITAL_COMMUNITY): Admission: RE | Admit: 2018-01-23 | Payer: Medicaid Other | Source: Ambulatory Visit

## 2018-01-23 DIAGNOSIS — G43A0 Cyclical vomiting, not intractable: Secondary | ICD-10-CM | POA: Diagnosis not present

## 2018-01-23 DIAGNOSIS — Z79899 Other long term (current) drug therapy: Secondary | ICD-10-CM | POA: Diagnosis not present

## 2018-01-23 DIAGNOSIS — I10 Essential (primary) hypertension: Secondary | ICD-10-CM | POA: Diagnosis not present

## 2018-01-23 DIAGNOSIS — R109 Unspecified abdominal pain: Secondary | ICD-10-CM | POA: Diagnosis present

## 2018-01-23 LAB — COMPREHENSIVE METABOLIC PANEL
ALT: 37 U/L (ref 14–54)
AST: 32 U/L (ref 15–41)
Albumin: 4.2 g/dL (ref 3.5–5.0)
Alkaline Phosphatase: 67 U/L (ref 38–126)
Anion gap: 14 (ref 5–15)
BUN: 17 mg/dL (ref 6–20)
CO2: 25 mmol/L (ref 22–32)
Calcium: 10 mg/dL (ref 8.9–10.3)
Chloride: 96 mmol/L — ABNORMAL LOW (ref 101–111)
Creatinine, Ser: 0.88 mg/dL (ref 0.44–1.00)
GFR calc Af Amer: >60 mL/min (ref >60)
GFR calc non Af Amer: >60 mL/min (ref >60)
Glucose, Bld: 131 mg/dL — ABNORMAL HIGH (ref 65–99)
Potassium: 3.4 mmol/L — ABNORMAL LOW (ref 3.5–5.1)
Sodium: 135 mmol/L (ref 135–145)
Total Bilirubin: 1 mg/dL (ref 0.3–1.2)
Total Protein: 8.7 g/dL — ABNORMAL HIGH (ref 6.5–8.1)

## 2018-01-23 LAB — CBC
HCT: 41.2 % (ref 36.0–46.0)
Hemoglobin: 13.2 g/dL (ref 12.0–15.0)
MCH: 25.7 pg — ABNORMAL LOW (ref 26.0–34.0)
MCHC: 32 g/dL (ref 30.0–36.0)
MCV: 80.2 fL (ref 78.0–100.0)
Platelets: 397 10*3/uL (ref 150–400)
RBC: 5.14 MIL/uL — ABNORMAL HIGH (ref 3.87–5.11)
RDW: 15 % (ref 11.5–15.5)
WBC: 12.4 10*3/uL — ABNORMAL HIGH (ref 4.0–10.5)

## 2018-01-23 LAB — I-STAT BETA HCG BLOOD, ED (MC, WL, AP ONLY): I-stat hCG, quantitative: <5 m[IU]/mL (ref <5)

## 2018-01-23 LAB — LIPASE, BLOOD: Lipase: 26 U/L (ref 11–51)

## 2018-01-23 MED ORDER — ONDANSETRON 4 MG PO TBDP
4.0000 mg | ORAL_TABLET | Freq: Once | ORAL | Status: AC | PRN
  Administered 2018-01-23: 4 mg via ORAL
  Filled 2018-01-23: qty 1

## 2018-01-23 NOTE — ED Notes (Signed)
Pt stated that they could not void at this time.  Pt has a cup

## 2018-01-23 NOTE — ED Triage Notes (Signed)
Pt also states no BM since friday

## 2018-01-23 NOTE — ED Triage Notes (Signed)
Pt presents with abd pain, weakness, lightheadedness, headache and vomiting since Saturday; denies being around others with similar symptoms, denies fever;

## 2018-01-24 ENCOUNTER — Emergency Department (HOSPITAL_COMMUNITY)
Admission: EM | Admit: 2018-01-24 | Discharge: 2018-01-24 | Disposition: A | Discharge status: Home / Self Care | Payer: Medicaid Other | Attending: Emergency Medicine | Admitting: Emergency Medicine

## 2018-01-24 DIAGNOSIS — R1115 Cyclical vomiting syndrome unrelated to migraine: Secondary | ICD-10-CM

## 2018-01-24 MED ORDER — PROMETHAZINE HCL 25 MG RE SUPP: 25.0000 mg | Freq: Three times a day (TID) | RECTAL | 0 refills | Status: DC | PRN

## 2018-01-24 MED ORDER — DIPHENHYDRAMINE HCL 50 MG/ML IJ SOLN
25.0000 mg | Freq: Once | INTRAMUSCULAR | Status: AC
  Administered 2018-01-24: 25 mg via INTRAVENOUS
  Filled 2018-01-24: qty 1

## 2018-01-24 MED ORDER — SODIUM CHLORIDE 0.9 % IV BOLUS
1000.0000 mL | Freq: Once | INTRAVENOUS | Status: AC
  Administered 2018-01-24: 1000 mL via INTRAVENOUS

## 2018-01-24 MED ORDER — PROMETHAZINE HCL 25 MG PO TABS: 25.0000 mg | ORAL_TABLET | Freq: Four times a day (QID) | ORAL | 0 refills | Status: DC | PRN

## 2018-01-24 MED ORDER — METOCLOPRAMIDE HCL 5 MG/ML IJ SOLN
10.0000 mg | Freq: Once | INTRAMUSCULAR | Status: AC
  Administered 2018-01-24: 10 mg via INTRAVENOUS
  Filled 2018-01-24: qty 2

## 2018-01-24 MED ORDER — ONDANSETRON 4 MG PO TBDP: 4.0000 mg | ORAL_TABLET | Freq: Three times a day (TID) | ORAL | 0 refills | Status: DC | PRN

## 2018-01-24 MED ORDER — KETOROLAC TROMETHAMINE 30 MG/ML IJ SOLN
30.0000 mg | Freq: Once | INTRAMUSCULAR | Status: AC
  Administered 2018-01-24: 30 mg via INTRAVENOUS
  Filled 2018-01-24: qty 1

## 2018-01-24 NOTE — Discharge Instructions (Signed)
Return if symptoms are net being adequately controlled at home.
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
## 2018-01-30 ENCOUNTER — Other Ambulatory Visit: Payer: Self-pay

## 2018-01-30 ENCOUNTER — Emergency Department (HOSPITAL_COMMUNITY)
Admission: EM | Admit: 2018-01-30 | Discharge: 2018-01-30 | Disposition: A | Discharge status: Home / Self Care | Payer: Medicaid Other | Attending: Emergency Medicine | Admitting: Emergency Medicine

## 2018-01-30 ENCOUNTER — Encounter (HOSPITAL_COMMUNITY): Payer: Self-pay

## 2018-01-30 DIAGNOSIS — R1110 Vomiting, unspecified: Secondary | ICD-10-CM | POA: Diagnosis not present

## 2018-01-30 DIAGNOSIS — Z5321 Procedure and treatment not carried out due to patient leaving prior to being seen by health care provider: Secondary | ICD-10-CM | POA: Insufficient documentation

## 2018-01-30 DIAGNOSIS — R109 Unspecified abdominal pain: Secondary | ICD-10-CM | POA: Diagnosis not present

## 2018-01-30 LAB — I-STAT BETA HCG BLOOD, ED (MC, WL, AP ONLY): I-stat hCG, quantitative: <5 m[IU]/mL (ref <5)

## 2018-01-30 LAB — CBC
HCT: 43 % (ref 36.0–46.0)
Hemoglobin: 13.6 g/dL (ref 12.0–15.0)
MCH: 25.6 pg — ABNORMAL LOW (ref 26.0–34.0)
MCHC: 31.6 g/dL (ref 30.0–36.0)
MCV: 81 fL (ref 78.0–100.0)
Platelets: 314 10*3/uL (ref 150–400)
RBC: 5.31 MIL/uL — ABNORMAL HIGH (ref 3.87–5.11)
RDW: 14.8 % (ref 11.5–15.5)
WBC: 11.8 10*3/uL — ABNORMAL HIGH (ref 4.0–10.5)

## 2018-01-30 LAB — COMPREHENSIVE METABOLIC PANEL
ALT: 40 U/L (ref 14–54)
AST: 30 U/L (ref 15–41)
Albumin: 4.2 g/dL (ref 3.5–5.0)
Alkaline Phosphatase: 64 U/L (ref 38–126)
Anion gap: 10 (ref 5–15)
BUN: 6 mg/dL (ref 6–20)
CO2: 26 mmol/L (ref 22–32)
Calcium: 10.1 mg/dL (ref 8.9–10.3)
Chloride: 100 mmol/L — ABNORMAL LOW (ref 101–111)
Creatinine, Ser: 0.71 mg/dL (ref 0.44–1.00)
GFR calc Af Amer: >60 mL/min (ref >60)
GFR calc non Af Amer: >60 mL/min (ref >60)
Glucose, Bld: 119 mg/dL — ABNORMAL HIGH (ref 65–99)
Potassium: 4.3 mmol/L (ref 3.5–5.1)
Sodium: 136 mmol/L (ref 135–145)
Total Bilirubin: 1.1 mg/dL (ref 0.3–1.2)
Total Protein: 8 g/dL (ref 6.5–8.1)

## 2018-01-30 LAB — LIPASE, BLOOD: Lipase: 26 U/L (ref 11–51)

## 2018-01-30 MED ORDER — ONDANSETRON 4 MG PO TBDP
4.0000 mg | ORAL_TABLET | Freq: Once | ORAL | Status: AC | PRN
  Administered 2018-01-30: 4 mg via ORAL
  Filled 2018-01-30: qty 1

## 2018-01-30 NOTE — ED Notes (Signed)
Pt called for x1. No response.

## 2018-01-30 NOTE — ED Triage Notes (Signed)
Pt reports vomiting and abdominal pain X3 days. Hx of same and seen here frequently for similar symptoms. Emesis in triage. Pt reports feeling dehydrated.

## 2018-02-01 NOTE — ED Notes (Signed)
02/01/2018, Attempted follow-up call.  No answer. 

## 2018-02-22 ENCOUNTER — Encounter (HOSPITAL_COMMUNITY): Payer: Medicaid Other | Attending: Gastroenterology

## 2018-02-26 ENCOUNTER — Other Ambulatory Visit: Payer: Self-pay

## 2018-02-26 ENCOUNTER — Emergency Department: Payer: Medicaid Other

## 2018-02-26 ENCOUNTER — Encounter: Payer: Self-pay | Admitting: Emergency Medicine

## 2018-02-26 ENCOUNTER — Observation Stay
Admission: EM | Admit: 2018-02-26 | Discharge: 2018-02-28 | Disposition: A | Discharge status: Home / Self Care | Payer: Medicaid Other | Attending: Internal Medicine | Admitting: Internal Medicine

## 2018-02-26 DIAGNOSIS — G43A1 Cyclical vomiting, intractable: Secondary | ICD-10-CM | POA: Diagnosis present

## 2018-02-26 DIAGNOSIS — Z79899 Other long term (current) drug therapy: Secondary | ICD-10-CM | POA: Insufficient documentation

## 2018-02-26 DIAGNOSIS — K219 Gastro-esophageal reflux disease without esophagitis: Secondary | ICD-10-CM | POA: Insufficient documentation

## 2018-02-26 DIAGNOSIS — E860 Dehydration: Secondary | ICD-10-CM | POA: Diagnosis not present

## 2018-02-26 DIAGNOSIS — F329 Major depressive disorder, single episode, unspecified: Secondary | ICD-10-CM | POA: Insufficient documentation

## 2018-02-26 DIAGNOSIS — K589 Irritable bowel syndrome without diarrhea: Secondary | ICD-10-CM | POA: Diagnosis not present

## 2018-02-26 DIAGNOSIS — Z9889 Other specified postprocedural states: Secondary | ICD-10-CM | POA: Insufficient documentation

## 2018-02-26 DIAGNOSIS — F411 Generalized anxiety disorder: Secondary | ICD-10-CM | POA: Diagnosis not present

## 2018-02-26 DIAGNOSIS — R109 Unspecified abdominal pain: Secondary | ICD-10-CM

## 2018-02-26 DIAGNOSIS — Z885 Allergy status to narcotic agent status: Secondary | ICD-10-CM | POA: Diagnosis not present

## 2018-02-26 DIAGNOSIS — R1012 Left upper quadrant pain: Secondary | ICD-10-CM | POA: Diagnosis not present

## 2018-02-26 DIAGNOSIS — E871 Hypo-osmolality and hyponatremia: Secondary | ICD-10-CM | POA: Diagnosis not present

## 2018-02-26 DIAGNOSIS — E876 Hypokalemia: Secondary | ICD-10-CM | POA: Diagnosis not present

## 2018-02-26 DIAGNOSIS — I10 Essential (primary) hypertension: Secondary | ICD-10-CM | POA: Insufficient documentation

## 2018-02-26 DIAGNOSIS — R1110 Vomiting, unspecified: Secondary | ICD-10-CM | POA: Diagnosis present

## 2018-02-26 DIAGNOSIS — R1115 Cyclical vomiting syndrome unrelated to migraine: Secondary | ICD-10-CM

## 2018-02-26 LAB — CBC
HCT: 40.5 % (ref 35.0–47.0)
Hemoglobin: 13.4 g/dL (ref 12.0–16.0)
MCH: 24.9 pg — ABNORMAL LOW (ref 26.0–34.0)
MCHC: 33.1 g/dL (ref 32.0–36.0)
MCV: 75.4 fL — ABNORMAL LOW (ref 80.0–100.0)
Platelets: 327 10*3/uL (ref 150–440)
RBC: 5.37 MIL/uL — ABNORMAL HIGH (ref 3.80–5.20)
RDW: 16.5 % — ABNORMAL HIGH (ref 11.5–14.5)
WBC: 12.6 10*3/uL — ABNORMAL HIGH (ref 3.6–11.0)

## 2018-02-26 LAB — LIPASE, BLOOD: Lipase: 25 U/L (ref 11–51)

## 2018-02-26 LAB — COMPREHENSIVE METABOLIC PANEL
ALT: 35 U/L (ref 14–54)
AST: 26 U/L (ref 15–41)
Albumin: 4.5 g/dL (ref 3.5–5.0)
Alkaline Phosphatase: 66 U/L (ref 38–126)
Anion gap: 10 (ref 5–15)
BUN: 17 mg/dL (ref 6–20)
CO2: 25 mmol/L (ref 22–32)
Calcium: 9.5 mg/dL (ref 8.9–10.3)
Chloride: 98 mmol/L — ABNORMAL LOW (ref 101–111)
Creatinine, Ser: 0.84 mg/dL (ref 0.44–1.00)
GFR calc Af Amer: >60 mL/min (ref >60)
GFR calc non Af Amer: >60 mL/min (ref >60)
Glucose, Bld: 122 mg/dL — ABNORMAL HIGH (ref 65–99)
Potassium: 3.1 mmol/L — ABNORMAL LOW (ref 3.5–5.1)
Sodium: 133 mmol/L — ABNORMAL LOW (ref 135–145)
Total Bilirubin: 0.9 mg/dL (ref 0.3–1.2)
Total Protein: 8.7 g/dL — ABNORMAL HIGH (ref 6.5–8.1)

## 2018-02-26 LAB — HCG, QUANTITATIVE, PREGNANCY: hCG, Beta Chain, Quant, S: <1 m[IU]/mL (ref <5)

## 2018-02-26 MED ORDER — ONDANSETRON HCL 4 MG/2ML IJ SOLN
4.0000 mg | Freq: Four times a day (QID) | INTRAMUSCULAR | Status: DC | PRN
  Administered 2018-02-27 (×2): 4 mg via INTRAVENOUS
  Filled 2018-02-26 (×2): qty 2

## 2018-02-26 MED ORDER — KETOROLAC TROMETHAMINE 30 MG/ML IJ SOLN
30.0000 mg | Freq: Once | INTRAMUSCULAR | Status: AC
  Administered 2018-02-26: 30 mg via INTRAVENOUS
  Filled 2018-02-26: qty 1

## 2018-02-26 MED ORDER — ONDANSETRON HCL 4 MG/2ML IJ SOLN
4.0000 mg | Freq: Once | INTRAMUSCULAR | Status: AC
  Administered 2018-02-26: 4 mg via INTRAVENOUS

## 2018-02-26 MED ORDER — TRAZODONE HCL 50 MG PO TABS: 25.0000 mg | ORAL_TABLET | Freq: Every evening | ORAL | Status: DC | PRN

## 2018-02-26 MED ORDER — PROMETHAZINE HCL 25 MG/ML IJ SOLN
25.0000 mg | Freq: Once | INTRAMUSCULAR | Status: AC
  Administered 2018-02-26: 25 mg via INTRAVENOUS

## 2018-02-26 MED ORDER — DOCUSATE SODIUM 100 MG PO CAPS
100.0000 mg | ORAL_CAPSULE | Freq: Two times a day (BID) | ORAL | Status: DC
  Filled 2018-02-26 (×2): qty 1

## 2018-02-26 MED ORDER — PROMETHAZINE HCL 25 MG/ML IJ SOLN
INTRAMUSCULAR | Status: AC
  Administered 2018-02-26: 25 mg via INTRAVENOUS
  Filled 2018-02-26: qty 1

## 2018-02-26 MED ORDER — PROMETHAZINE HCL 25 MG/ML IJ SOLN
12.5000 mg | Freq: Four times a day (QID) | INTRAMUSCULAR | Status: DC | PRN
  Administered 2018-02-28: 12.5 mg via INTRAVENOUS
  Filled 2018-02-26: qty 1

## 2018-02-26 MED ORDER — SODIUM CHLORIDE 0.9 % IV SOLN: INTRAVENOUS | Status: DC

## 2018-02-26 MED ORDER — PANTOPRAZOLE SODIUM 40 MG PO TBEC
40.0000 mg | DELAYED_RELEASE_TABLET | Freq: Every day | ORAL | Status: DC
  Administered 2018-02-27 – 2018-02-28 (×2): 40 mg via ORAL
  Filled 2018-02-26 (×2): qty 1

## 2018-02-26 MED ORDER — ONDANSETRON HCL 4 MG PO TABS
4.0000 mg | ORAL_TABLET | Freq: Four times a day (QID) | ORAL | Status: DC | PRN
  Administered 2018-02-28: 4 mg via ORAL
  Filled 2018-02-26: qty 1

## 2018-02-26 MED ORDER — METOCLOPRAMIDE HCL 5 MG/ML IJ SOLN
10.0000 mg | Freq: Four times a day (QID) | INTRAMUSCULAR | Status: DC
  Administered 2018-02-27 – 2018-02-28 (×6): 10 mg via INTRAVENOUS
  Filled 2018-02-26 (×6): qty 2

## 2018-02-26 MED ORDER — ONDANSETRON HCL 4 MG/2ML IJ SOLN
INTRAMUSCULAR | Status: AC
  Administered 2018-02-26: 4 mg via INTRAVENOUS
  Filled 2018-02-26: qty 2

## 2018-02-26 MED ORDER — ZOLPIDEM TARTRATE 5 MG PO TABS
5.0000 mg | ORAL_TABLET | Freq: Every evening | ORAL | Status: DC | PRN
  Administered 2018-02-27: 5 mg via ORAL
  Filled 2018-02-26 (×2): qty 1

## 2018-02-26 MED ORDER — LORATADINE 10 MG PO TABS
10.0000 mg | ORAL_TABLET | Freq: Every day | ORAL | Status: DC
  Administered 2018-02-27 – 2018-02-28 (×2): 10 mg via ORAL
  Filled 2018-02-26 (×2): qty 1

## 2018-02-26 MED ORDER — METOCLOPRAMIDE HCL 5 MG/ML IJ SOLN
INTRAMUSCULAR | Status: AC
  Administered 2018-02-26: 10 mg via INTRAVENOUS
  Filled 2018-02-26: qty 2

## 2018-02-26 MED ORDER — DIPHENHYDRAMINE HCL 50 MG/ML IJ SOLN
INTRAMUSCULAR | Status: AC
  Administered 2018-02-26: 50 mg via INTRAVENOUS
  Filled 2018-02-26: qty 1

## 2018-02-26 MED ORDER — HYDROCODONE-ACETAMINOPHEN 5-325 MG PO TABS
1.0000 | ORAL_TABLET | ORAL | Status: DC | PRN
  Administered 2018-02-27 – 2018-02-28 (×6): 2 via ORAL
  Administered 2018-02-28 (×2): 1 via ORAL
  Filled 2018-02-26 (×5): qty 2
  Filled 2018-02-26 (×2): qty 1
  Filled 2018-02-26: qty 2

## 2018-02-26 MED ORDER — ACETAMINOPHEN 325 MG PO TABS: 650.0000 mg | ORAL_TABLET | Freq: Four times a day (QID) | ORAL | Status: DC | PRN

## 2018-02-26 MED ORDER — DIPHENHYDRAMINE HCL 50 MG/ML IJ SOLN
50.0000 mg | Freq: Once | INTRAMUSCULAR | Status: AC
  Administered 2018-02-26: 50 mg via INTRAVENOUS

## 2018-02-26 MED ORDER — SODIUM CHLORIDE 0.9 % IV BOLUS
1000.0000 mL | Freq: Once | INTRAVENOUS | Status: AC
  Administered 2018-02-26: 1000 mL via INTRAVENOUS

## 2018-02-26 MED ORDER — BISACODYL 5 MG PO TBEC: 5.0000 mg | DELAYED_RELEASE_TABLET | Freq: Every day | ORAL | Status: DC | PRN

## 2018-02-26 MED ORDER — PROMETHAZINE HCL 25 MG/ML IJ SOLN
25.0000 mg | Freq: Once | INTRAMUSCULAR | Status: AC
  Administered 2018-02-26: 25 mg via INTRAVENOUS
  Filled 2018-02-26: qty 1

## 2018-02-26 MED ORDER — HEPARIN SODIUM (PORCINE) 5000 UNIT/ML IJ SOLN
5000.0000 [IU] | Freq: Three times a day (TID) | INTRAMUSCULAR | Status: DC
  Administered 2018-02-27 – 2018-02-28 (×5): 5000 [IU] via SUBCUTANEOUS
  Filled 2018-02-26 (×5): qty 1

## 2018-02-26 MED ORDER — IOPAMIDOL (ISOVUE-370) INJECTION 76%
75.0000 mL | Freq: Once | INTRAVENOUS | Status: AC | PRN
  Administered 2018-02-26: 75 mL via INTRAVENOUS
  Filled 2018-02-26: qty 75

## 2018-02-26 MED ORDER — POTASSIUM CHLORIDE IN NACL 20-0.9 MEQ/L-% IV SOLN
INTRAVENOUS | Status: DC
  Administered 2018-02-27 – 2018-02-28 (×3): via INTRAVENOUS
  Filled 2018-02-26 (×7): qty 1000

## 2018-02-26 MED ORDER — ACETAMINOPHEN 650 MG RE SUPP: 650.0000 mg | Freq: Four times a day (QID) | RECTAL | Status: DC | PRN

## 2018-02-26 MED ORDER — METOCLOPRAMIDE HCL 5 MG/ML IJ SOLN
10.0000 mg | Freq: Once | INTRAMUSCULAR | Status: AC
  Administered 2018-02-26: 10 mg via INTRAVENOUS

## 2018-02-26 MED ORDER — IOPAMIDOL (ISOVUE-300) INJECTION 61%
30.0000 mL | Freq: Once | INTRAVENOUS | Status: AC | PRN
  Administered 2018-02-26: 30 mL via ORAL
  Filled 2018-02-26: qty 30

## 2018-02-26 NOTE — ED Notes (Signed)
Pt resting with eyes closed at this time. No vomiting noted by this RN since giving zofran.

## 2018-02-26 NOTE — ED Notes (Signed)
Patient states she threw up the oral contrast and requested pain medication for abdominal pain, stating she can't drink the oral contrast if her abdomen is hurting. Dr. Lenard Lance aware.

## 2018-02-26 NOTE — ED Triage Notes (Signed)
Pt to ED via POV for abdominal pain and nausea. Pt reports that she has had pain for the past 4 days. Pt seen recently at [facility] for similar symptoms. Pt is in mild discomfort at this time.

## 2018-02-26 NOTE — ED Notes (Signed)
CT techs aware that patient cannot tolerate the oral contrast and requested a pregnancy test prior to CT. Dr. Lenard Lance aware.

## 2018-02-26 NOTE — ED Notes (Addendum)
Pt continues to c/o pain and nausea and is asking about getting a scan and more pain medications.  Dr. Lenard Lance already explained to her previously that he was not going to order any other pain medications except tylenol. Pt states she cannot give urine sample at this time.
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
## 2018-02-26 NOTE — ED Notes (Signed)
Pt states she has had the abdominal pain for about 4 days, recently seen at [facility] in [facility], states she felt better for a few weeks up until 4 days ago.  Lights dimmed at this time. Pt provided with warm blanket.  Pt informed that she needs a urine sample. Pt states after fluids she should be able to go.
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
## 2018-02-26 NOTE — ED Notes (Signed)
[name], EDT attempted for blood, this RN attempted for blood x 2 without success.
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
## 2018-02-26 NOTE — ED Notes (Signed)
Pt here for abdominal and vomiting, pt recently seen at [facility] for similar sxs.  Pt had clear pink emesis in emesis bag hanging on wheelchair on arrival to room.  Emesis did not appear to be pink from blood rather from sports drink or juice.

## 2018-02-27 ENCOUNTER — Other Ambulatory Visit: Payer: Self-pay

## 2018-02-27 ENCOUNTER — Encounter: Payer: Self-pay | Admitting: *Deleted

## 2018-02-27 DIAGNOSIS — R109 Unspecified abdominal pain: Secondary | ICD-10-CM

## 2018-02-27 DIAGNOSIS — G43A1 Cyclical vomiting, intractable: Principal | ICD-10-CM

## 2018-02-27 LAB — BASIC METABOLIC PANEL
Anion gap: 7 (ref 5–15)
BUN: 17 mg/dL (ref 6–20)
CO2: 26 mmol/L (ref 22–32)
Calcium: 8.6 mg/dL — ABNORMAL LOW (ref 8.9–10.3)
Chloride: 103 mmol/L (ref 101–111)
Creatinine, Ser: 0.83 mg/dL (ref 0.44–1.00)
GFR calc Af Amer: >60 mL/min (ref >60)
GFR calc non Af Amer: >60 mL/min (ref >60)
Glucose, Bld: 89 mg/dL (ref 65–99)
Potassium: 3 mmol/L — ABNORMAL LOW (ref 3.5–5.1)
Sodium: 136 mmol/L (ref 135–145)

## 2018-02-27 LAB — URINE DRUG SCREEN, QUALITATIVE (ARMC ONLY)
Amphetamines, Ur Screen: NOT DETECTED
Barbiturates, Ur Screen: NOT DETECTED
Benzodiazepine, Ur Scrn: NOT DETECTED
Cannabinoid 50 Ng, Ur ~~LOC~~: POSITIVE — AB
Cocaine Metabolite,Ur ~~LOC~~: NOT DETECTED
MDMA (Ecstasy)Ur Screen: NOT DETECTED
Methadone Scn, Ur: NOT DETECTED
Opiate, Ur Screen: POSITIVE — AB
Phencyclidine (PCP) Ur S: NOT DETECTED
Tricyclic, Ur Screen: NOT DETECTED

## 2018-02-27 LAB — CBC
HCT: 34.7 % — ABNORMAL LOW (ref 35.0–47.0)
Hemoglobin: 11.6 g/dL — ABNORMAL LOW (ref 12.0–16.0)
MCH: 25.3 pg — ABNORMAL LOW (ref 26.0–34.0)
MCHC: 33.4 g/dL (ref 32.0–36.0)
MCV: 75.6 fL — ABNORMAL LOW (ref 80.0–100.0)
Platelets: 258 10*3/uL (ref 150–440)
RBC: 4.59 MIL/uL (ref 3.80–5.20)
RDW: 16.3 % — ABNORMAL HIGH (ref 11.5–14.5)
WBC: 7.2 10*3/uL (ref 3.6–11.0)

## 2018-02-27 LAB — URINALYSIS, COMPLETE (UACMP) WITH MICROSCOPIC
Bilirubin Urine: NEGATIVE
Glucose, UA: NEGATIVE mg/dL
Hgb urine dipstick: NEGATIVE
Ketones, ur: 5 mg/dL — AB
Leukocytes, UA: NEGATIVE
Nitrite: NEGATIVE
Protein, ur: 30 mg/dL — AB
Specific Gravity, Urine: 1.045 — ABNORMAL HIGH (ref 1.005–1.030)
pH: 6 (ref 5.0–8.0)

## 2018-02-27 LAB — MAGNESIUM: Magnesium: 2.1 mg/dL (ref 1.7–2.4)

## 2018-02-27 LAB — POTASSIUM: Potassium: 3.2 mmol/L — ABNORMAL LOW (ref 3.5–5.1)

## 2018-02-27 LAB — GLUCOSE, CAPILLARY: Glucose-Capillary: 82 mg/dL (ref 65–99)

## 2018-02-27 MED ORDER — POTASSIUM CHLORIDE 10 MEQ/100ML IV SOLN
10.0000 meq | INTRAVENOUS | Status: AC
  Administered 2018-02-27 (×3): 10 meq via INTRAVENOUS
  Filled 2018-02-27 (×2): qty 100

## 2018-02-27 MED ORDER — SODIUM CHLORIDE 0.9% FLUSH: 10.0000 mL | INTRAVENOUS | Status: DC | PRN

## 2018-02-27 MED ORDER — POTASSIUM CHLORIDE CRYS ER 20 MEQ PO TBCR
40.0000 meq | EXTENDED_RELEASE_TABLET | Freq: Once | ORAL | Status: AC
  Administered 2018-02-27: 40 meq via ORAL
  Filled 2018-02-27: qty 2

## 2018-02-27 MED ORDER — BOOST / RESOURCE BREEZE PO LIQD CUSTOM
1.0000 | Freq: Three times a day (TID) | ORAL | Status: DC
  Administered 2018-02-27 (×2): 1 via ORAL

## 2018-02-27 MED ORDER — POTASSIUM CHLORIDE CRYS ER 20 MEQ PO TBCR: 40.0000 meq | EXTENDED_RELEASE_TABLET | Freq: Once | ORAL | Status: DC
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
## 2018-02-27 NOTE — Progress Notes (Signed)
Initial Nutrition Assessment  DOCUMENTATION CODES:   Obesity unspecified  INTERVENTION:   Boost Breeze po TID, each supplement provides 250 kcal and 9 grams of protein  NUTRITION DIAGNOSIS:   Inadequate oral intake related to acute illness as evidenced by per patient/family report.  GOAL:   Patient will meet greater than or equal to 90% of their needs  MONITOR:   PO intake, Supplement acceptance, Labs, Weight trends, I & O's  REASON FOR ASSESSMENT:   Malnutrition Screening Tool    ASSESSMENT:   32 year old female with history of anxiety and gastric reflux who presents with intractable nausea and vomiting.   Met with pt in room today. Pt reports intermittent poor appetite and oral intake since December r/t intermittent nausea and vomiting. Pt reports that when she is having episodes of vomiting she will vomit all day. Per chart, pt has lost 26lbs(10%) over the past 3 months; this is significant given the time frame. Pt reports wt loss was r/t her not being able to eat. Pt positive for Cannabinoids on admit. Pt reports continued nausea and one episode of vomiting this morning. Pt ate an [place] ice for lunch today and so far has kept this down. RD will order Boost Breeze to help pt meet her estimated protein needs. Pt would like to have chocolate Premier Protein when diet advanced.   Medications reviewed and include: colace, heparin, reglan, protonix, NaCl with KCl '@100ml' /hr, KCl, zofran  Labs reviewed: K 3.0(L), Ca 8.6(L)  Nutrition-Focused physical exam completed. Findings are no fat depletion, no muscle depletion, and no edema.   Diet Order:  Diet clear liquid Room service appropriate? Yes; Fluid consistency: Thin  EDUCATION NEEDS:   Education needs have been addressed  Skin:  Skin Assessment: Reviewed RN Assessment  Last BM:  4/28  Height:   Ht Readings from Last 1 Encounters:  02/26/18 '5\' 6"'  (1.676 m)    Weight:   Wt Readings from Last 1 Encounters:   02/27/18 232 lb 12.9 oz (105.6 kg)    Ideal Body Weight:  59 kg  BMI:  Body mass index is 37.58 kg/m.  Estimated Nutritional Needs:   Kcal:  1900-2200kcal/day   Protein:  84-105g/day   Fluid:  1.8L/day   Koleen Distance MS, RD, LDN Pager #[phone] After Hours Pager: [phone]

## 2018-02-27 NOTE — Progress Notes (Signed)
[facility]   PATIENT NAME: [name]    MR#:  [id number]  DATE OF BIRTH:  1985-11-07  SUBJECTIVE:   Patient had vomiting this morning.  REVIEW OF SYSTEMS:    Review of Systems  Constitutional: Negative for fever, chills weight loss HENT: Negative for ear pain, nosebleeds, congestion, facial swelling, rhinorrhea, neck pain, neck stiffness and ear discharge.   Respiratory: Negative for cough, shortness of breath, wheezing  Cardiovascular: Negative for chest pain, palpitations and leg swelling.  Gastrointestinal: Positive for nausea and vomiting with generalized abdominal pain   Genitourinary: Negative for dysuria, urgency, frequency, hematuria Musculoskeletal: Negative for back pain or joint pain Neurological: Negative for dizziness, seizures, syncope, focal weakness,  numbness and headaches.  Hematological: Does not bruise/bleed easily.  Psychiatric/Behavioral: Negative for hallucinations, confusion, dysphoric mood    Tolerating Diet: no      DRUG ALLERGIES:   Allergies  Allergen Reactions  . Morphine And Related Other (See Comments)    Irritation     VITALS:  Blood pressure 116/63, pulse 70, temperature 98.2 F (36.8 C), temperature source Oral, resp. rate 20, height  (1.676 m), weight 105.6 kg (232 lb 12.9 oz), SpO2 100 %.  PHYSICAL EXAMINATION:  Constitutional: Appears well-developed and well-nourished. No distress. HENT: Normocephalic. Marland Kitchen Oropharynx is clear and moist.  Eyes: Conjunctivae and EOM are normal. PERRLA, no scleral icterus.  Neck: Normal ROM. Neck supple. No JVD. No tracheal deviation. CVS: RRR, S1/S2 +, no murmurs, no gallops, no carotid bruit.  Pulmonary: Effort and breath sounds normal, no stridor, rhonchi, wheezes, rales.  Abdominal: Soft. BS +,  no distension, tenderness, rebound or guarding.  Musculoskeletal: Normal range of motion. No edema and no tenderness.  Neuro: Alert. CN 2-12 grossly  intact. No focal deficits. Skin: Skin is warm and dry. No rash noted. Psychiatric: Normal mood and affect.      LABORATORY PANEL:   CBC Recent Labs  Lab 02/27/18 0656  WBC 7.2  HGB 11.6*  HCT 34.7*  PLT 258   ------------------------------------------------------------------------------------------------------------------  Chemistries  Recent Labs  Lab 02/26/18 1433 02/27/18 0656  NA 133* 136  K 3.1* 3.0*  CL 98* 103  CO2 25 26  GLUCOSE 122* 89  BUN 17 17  CREATININE 0.84 0.83  CALCIUM 9.5 8.6*  MG  --  2.1  AST 26  --   ALT 35  --   ALKPHOS 66  --   BILITOT 0.9  --    ------------------------------------------------------------------------------------------------------------------  Cardiac Enzymes No results for input(s): TROPONINI in the last 168 hours. ------------------------------------------------------------------------------------------------------------------  RADIOLOGY:  Ct Abdomen Pelvis W Contrast  Result Date: 02/26/2018 CLINICAL DATA:  Pt to ED via POV for abdominal pain and nausea. Pt reports that she has had pain for the past 4 days. Pt seen recently at [facility] for similar symptoms. Appendectomy EXAM: CT ABDOMEN AND PELVIS WITH CONTRAST TECHNIQUE: Multidetector CT imaging of the abdomen and pelvis was performed using the standard protocol following bolus administration of intravenous contrast. CONTRAST:  75mL ISOVUE-370 IOPAMIDOL (ISOVUE-370) INJECTION 76% COMPARISON:  11/23/2017 FINDINGS: Lower chest: Clear lung bases.  Heart normal size. Hepatobiliary: Liver normal in size. Focal fat is seen adjacent to the falciform ligament. No liver masses. Normal gallbladder. No bile duct dilation. Pancreas: Unremarkable. No pancreatic ductal dilatation or surrounding inflammatory changes. Spleen: Normal in size without focal abnormality. Adrenals/Urinary Tract: No adrenal masses. Kidneys normal in size, orientation and position. 6 mm low-density mass in the  midpole the right kidney consistent with a cyst. 4 mm subtle low-density lesion in the medial midpole the left kidney, also likely a cyst. No other renal masses or lesions, no stones and no hydronephrosis. Normal ureters. Normal bladder. Stomach/Bowel: Stomach and small bowel unremarkable. There is a bowel anastomosis staple line along the inferior right colon, stable from the prior CT. Colon otherwise unremarkable. Vascular/Lymphatic: No significant vascular findings are present. No enlarged abdominal or pelvic lymph nodes. Reproductive: Uterus and bilateral adnexa are unremarkable. Other: No abdominal wall hernia or abnormality. No abdominopelvic ascites. Musculoskeletal: No fracture or acute finding. No osteoblastic or osteolytic lesions. IMPRESSION: 1. No acute findings within the abdomen pelvis. No findings to account for the patient's abdominal pain and nausea. 2. Status post right colon surgery, stable from the prior exam. Electronically Signed   By: Amie Portland M.D.   On: 02/26/2018 21:33     ASSESSMENT AND PLAN:   32 year old female with history of anxiety and gastric reflux who presents with intractable nausea and vomiting.  1.  Intractable nausea and vomiting with abdominal pain: Patient has had a long history of this since age 70 she says. Continue supportive management GI consultation pending CT scan of the abdomen shows no acute pathology  2.  Hypokalemia: Replete and recheck in a.m. Mg levels normal  3.  Hyponatremia: This is improved with IV fluids  4.  Leukocytosis: This is due to dehydration with nausea not acute infection      Management plans discussed with the patient and she is in agreement.  CODE STATUS: full  TOTAL TIME TAKING CARE OF THIS PATIENT: 28 minutes.     POSSIBLE D/C 1-2 days, DEPENDING ON CLINICAL CONDITION.   Jaeson Molstad M.D on 02/27/2018 at 10:37 AM  Between 7am to 6pm - Pager - [phone] After 6pm go to [email or web address] - Air traffic controller  [facility]  Office  [phone]  CC: Primary care physician; Fayrene Helper, NP  Note: This dictation was prepared with Dragon dictation along with smaller phrase technology. Any transcriptional errors that result from this process are unintentional.
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
## 2018-02-28 LAB — HIV ANTIBODY (ROUTINE TESTING W REFLEX): HIV Screen 4th Generation wRfx: NONREACTIVE

## 2018-02-28 LAB — GLUCOSE, CAPILLARY: Glucose-Capillary: 77 mg/dL (ref 65–99)

## 2018-02-28 MED ORDER — PREMIER PROTEIN SHAKE: 11.0000 [oz_av] | Freq: Two times a day (BID) | ORAL | Status: DC

## 2018-02-28 MED ORDER — PROMETHAZINE HCL 12.5 MG PO TABS: 12.5000 mg | ORAL_TABLET | Freq: Four times a day (QID) | ORAL | 0 refills | Status: DC | PRN
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
## 2018-02-28 NOTE — Progress Notes (Signed)
Patient upset about being "sick and tired of being sick and tired and not being able to be a good mother"; emotional support given to patient; Windy Carina, RN 2:10 AM 02/28/2018

## 2018-03-25 ENCOUNTER — Ambulatory Visit (HOSPITAL_COMMUNITY)
Admission: EM | Admit: 2018-03-25 | Discharge: 2018-03-25 | Disposition: A | Discharge status: Home / Self Care | Payer: Medicaid Other | Attending: Family Medicine | Admitting: Family Medicine

## 2018-03-25 ENCOUNTER — Encounter (HOSPITAL_COMMUNITY): Payer: Self-pay | Admitting: Emergency Medicine

## 2018-03-25 DIAGNOSIS — M25562 Pain in left knee: Secondary | ICD-10-CM | POA: Diagnosis not present

## 2018-03-25 DIAGNOSIS — M25561 Pain in right knee: Secondary | ICD-10-CM | POA: Diagnosis not present

## 2018-03-25 DIAGNOSIS — W19XXXA Unspecified fall, initial encounter: Secondary | ICD-10-CM

## 2018-03-25 DIAGNOSIS — M5441 Lumbago with sciatica, right side: Secondary | ICD-10-CM | POA: Diagnosis not present

## 2018-03-25 MED ORDER — TRAMADOL HCL 50 MG PO TABS: 50.0000 mg | ORAL_TABLET | Freq: Four times a day (QID) | ORAL | 0 refills | Status: AC | PRN

## 2018-03-25 MED ORDER — PREDNISONE 10 MG (21) PO TBPK: ORAL_TABLET | ORAL | 0 refills | Status: DC
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
## 2018-03-25 NOTE — ED Triage Notes (Signed)
Pt sts fall with knee pain, back pain and head pain

## 2018-04-27 ENCOUNTER — Encounter (HOSPITAL_COMMUNITY): Payer: Self-pay | Admitting: Emergency Medicine

## 2018-04-27 ENCOUNTER — Ambulatory Visit (HOSPITAL_COMMUNITY)
Admission: EM | Admit: 2018-04-27 | Discharge: 2018-04-27 | Disposition: A | Discharge status: Home / Self Care | Payer: Medicaid Other | Attending: Family Medicine | Admitting: Family Medicine

## 2018-04-27 DIAGNOSIS — M5441 Lumbago with sciatica, right side: Secondary | ICD-10-CM | POA: Diagnosis not present

## 2018-04-27 MED ORDER — MELOXICAM 15 MG PO TABS: 15.0000 mg | ORAL_TABLET | Freq: Every day | ORAL | 0 refills | Status: DC
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
## 2018-04-27 NOTE — ED Triage Notes (Signed)
Pt states she fell a month ago and was seen for it, c/o ongoing back pain. Had xrays done.;

## 2018-04-27 NOTE — Discharge Instructions (Signed)
Light and regular activity as tolerated. Continue to use muscle relaxer's as needed.  Daily meloxicam, take with food.  See exercises provided for strengthening and stretching. Please continue to follow with your primary care provider for persistent symptoms.

## 2018-05-14 ENCOUNTER — Other Ambulatory Visit: Payer: Self-pay

## 2018-05-14 ENCOUNTER — Emergency Department (HOSPITAL_COMMUNITY)
Admission: EM | Admit: 2018-05-14 | Discharge: 2018-05-15 | Disposition: A | Discharge status: Home / Self Care | Payer: Medicaid Other | Attending: Emergency Medicine | Admitting: Emergency Medicine

## 2018-05-14 ENCOUNTER — Encounter (HOSPITAL_COMMUNITY): Payer: Self-pay | Admitting: *Deleted

## 2018-05-14 DIAGNOSIS — Z79899 Other long term (current) drug therapy: Secondary | ICD-10-CM | POA: Insufficient documentation

## 2018-05-14 DIAGNOSIS — R1012 Left upper quadrant pain: Secondary | ICD-10-CM

## 2018-05-14 DIAGNOSIS — I10 Essential (primary) hypertension: Secondary | ICD-10-CM | POA: Diagnosis not present

## 2018-05-14 DIAGNOSIS — R112 Nausea with vomiting, unspecified: Secondary | ICD-10-CM | POA: Insufficient documentation

## 2018-05-14 LAB — CBC
HCT: 44.7 % (ref 36.0–46.0)
Hemoglobin: 13.9 g/dL (ref 12.0–15.0)
MCH: 24.1 pg — ABNORMAL LOW (ref 26.0–34.0)
MCHC: 31.1 g/dL (ref 30.0–36.0)
MCV: 77.5 fL — ABNORMAL LOW (ref 78.0–100.0)
Platelets: 319 10*3/uL (ref 150–400)
RBC: 5.77 MIL/uL — ABNORMAL HIGH (ref 3.87–5.11)
RDW: 14.4 % (ref 11.5–15.5)
WBC: 10.2 10*3/uL (ref 4.0–10.5)

## 2018-05-14 LAB — COMPREHENSIVE METABOLIC PANEL
ALT: 36 U/L (ref 0–44)
AST: 24 U/L (ref 15–41)
Albumin: 4.3 g/dL (ref 3.5–5.0)
Alkaline Phosphatase: 68 U/L (ref 38–126)
Anion gap: 13 (ref 5–15)
BUN: 13 mg/dL (ref 6–20)
CO2: 24 mmol/L (ref 22–32)
Calcium: 9.9 mg/dL (ref 8.9–10.3)
Chloride: 98 mmol/L (ref 98–111)
Creatinine, Ser: 0.77 mg/dL (ref 0.44–1.00)
GFR calc Af Amer: >60 mL/min (ref >60)
GFR calc non Af Amer: >60 mL/min (ref >60)
Glucose, Bld: 123 mg/dL — ABNORMAL HIGH (ref 70–99)
Potassium: 3.2 mmol/L — ABNORMAL LOW (ref 3.5–5.1)
Sodium: 135 mmol/L (ref 135–145)
Total Bilirubin: 1.1 mg/dL (ref 0.3–1.2)
Total Protein: 8.5 g/dL — ABNORMAL HIGH (ref 6.5–8.1)

## 2018-05-14 LAB — URINALYSIS, ROUTINE W REFLEX MICROSCOPIC
Bilirubin Urine: NEGATIVE
Glucose, UA: NEGATIVE mg/dL
Hgb urine dipstick: NEGATIVE
Ketones, ur: 5 mg/dL — AB
Leukocytes, UA: NEGATIVE
Nitrite: NEGATIVE
Protein, ur: 30 mg/dL — AB
Specific Gravity, Urine: 1.025 (ref 1.005–1.030)
pH: 6 (ref 5.0–8.0)

## 2018-05-14 LAB — LIPASE, BLOOD: Lipase: 34 U/L (ref 11–51)

## 2018-05-14 LAB — I-STAT BETA HCG BLOOD, ED (MC, WL, AP ONLY): I-stat hCG, quantitative: <5 m[IU]/mL (ref <5)

## 2018-05-14 MED ORDER — SODIUM CHLORIDE 0.9 % IV BOLUS
1000.0000 mL | Freq: Once | INTRAVENOUS | Status: AC
  Administered 2018-05-14: 1000 mL via INTRAVENOUS

## 2018-05-14 MED ORDER — MORPHINE SULFATE (PF) 4 MG/ML IV SOLN
4.0000 mg | Freq: Once | INTRAVENOUS | Status: AC
  Administered 2018-05-14: 4 mg via INTRAVENOUS
  Filled 2018-05-14: qty 1

## 2018-05-14 MED ORDER — ONDANSETRON HCL 4 MG/2ML IJ SOLN
4.0000 mg | Freq: Once | INTRAMUSCULAR | Status: AC
  Administered 2018-05-14: 4 mg via INTRAVENOUS
  Filled 2018-05-14: qty 2
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
## 2018-05-14 NOTE — ED Triage Notes (Signed)
The pt is c/o abd pain with nv and diarhea since thursdsay.  lmp 2 weeks

## 2018-05-15 MED ORDER — ONDANSETRON 4 MG PO TBDP: 4.0000 mg | ORAL_TABLET | Freq: Three times a day (TID) | ORAL | 0 refills | Status: DC | PRN

## 2018-05-15 NOTE — Discharge Instructions (Signed)
Please call your primary care provider to schedule follow-up tomorrow morning. Please call your gastroenterologist, Dr. Dulce Sellarutlaw to schedule a follow-up. Please return to the emergency department for any new or worsening symptoms or if your symptoms do not improve. You may use orally disintegrating tablets of Zofran as prescribed for nausea. Please be sure to drink plenty of fluids to avoid dehydration. Your blood pressure was elevated today be sure to follow-up with your primary care provider regarding this as well.  Contact a health care provider if: Your abdominal pain changes or gets worse. You are not hungry or you lose weight without trying. You are constipated or have diarrhea for more than 2-3 days. You have pain when you urinate or have a bowel movement. Your abdominal pain wakes you up at night. Your pain gets worse with meals, after eating, or with certain foods. You are throwing up and cannot keep anything down. You have a fever. Get help right away if: Your pain does not go away as soon as your health care provider told you to expect. You cannot stop throwing up. Your pain is only in areas of the abdomen, such as the right side or the left lower portion of the abdomen. You have bloody or black stools, or stools that look like tar. You have severe pain, cramping, or bloating in your abdomen. You have signs of dehydration, such as: Dark urine, very little urine, or no urine. Cracked lips. Dry mouth. Sunken eyes. Sleepiness. Weakness.

## 2018-05-16 LAB — URINE CULTURE: Culture: 40000 — AB

## 2018-05-17 ENCOUNTER — Telehealth: Payer: Self-pay | Admitting: *Deleted

## 2018-05-17 NOTE — Telephone Encounter (Signed)
Post ED Visit - Positive Culture Follow-up  Culture report reviewed by antimicrobial stewardship pharmacist:  []  Enzo BiNathan Batchelder, Pharm.D. []  Celedonio MiyamotoJeremy Frens, Pharm.D., BCPS AQ-ID []  Garvin FilaMike Maccia, Pharm.D., BCPS []  Georgina PillionElizabeth Martin, Pharm.D., BCPS []  Long LakeMinh Pham, 17[address] Rainbow BoulevardPharm.D., BCPS, AAHIVP []  Estella HuskMichelle Turner, Pharm.D., BCPS, AAHIVP [x]  Lysle Pearlachel Rumbarger, PharmD, BCPS []  Phillips Climeshuy Dang, PharmD, BCPS []  Agapito GamesAlison Masters, PharmD, BCPS []  Verlan FriendsErin Deja, PharmD  Positive urine culture, contaminant  No further patient follow-up is required at this time.  Virl AxeRobertson, Abdiaziz Klahn [facility] 05/17/2018, 10:34 AM

## 2018-06-12 ENCOUNTER — Encounter (HOSPITAL_COMMUNITY): Payer: Self-pay | Admitting: Emergency Medicine

## 2018-06-12 ENCOUNTER — Other Ambulatory Visit: Payer: Self-pay

## 2018-06-12 ENCOUNTER — Ambulatory Visit (HOSPITAL_COMMUNITY)
Admission: EM | Admit: 2018-06-12 | Discharge: 2018-06-12 | Disposition: A | Discharge status: Home / Self Care | Payer: Medicaid Other | Attending: Family Medicine | Admitting: Family Medicine

## 2018-06-12 DIAGNOSIS — R197 Diarrhea, unspecified: Secondary | ICD-10-CM | POA: Diagnosis not present

## 2018-06-12 DIAGNOSIS — R519 Headache, unspecified: Secondary | ICD-10-CM

## 2018-06-12 DIAGNOSIS — R51 Headache: Secondary | ICD-10-CM

## 2018-06-12 NOTE — ED Notes (Signed)
Pt refusing urine sample at this time.  She states it has nothing to do with her headache and she wants to know what she can take OTC for her symptoms.

## 2018-06-12 NOTE — ED Triage Notes (Addendum)
Pt reports headache, diarrhea, and abdominal pain x2 days.  She states she takes Diclofenac TID.  It has been explained to the pt that we can only write her a work note dating today and the future.

## 2018-06-12 NOTE — Discharge Instructions (Signed)
Small frequent sips of fluids- Pedialyte, Gatorade, water, broth- to maintain hydration.   Bland diet as tolerated, advance as tolerated.  Diclofenac or ibuprofen as prescribed previously as needed for headache, not both.  Tylenol or Excedrin as needed for headache.  If worsening of symptoms please return or go to the Er.
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
## 2018-07-11 ENCOUNTER — Other Ambulatory Visit: Payer: Self-pay

## 2018-07-11 ENCOUNTER — Emergency Department (HOSPITAL_COMMUNITY)
Admission: EM | Admit: 2018-07-11 | Discharge: 2018-07-12 | Disposition: A | Discharge status: Home / Self Care | Payer: Medicaid Other | Attending: Emergency Medicine | Admitting: Emergency Medicine

## 2018-07-11 ENCOUNTER — Encounter (HOSPITAL_COMMUNITY): Payer: Self-pay | Admitting: *Deleted

## 2018-07-11 DIAGNOSIS — N3001 Acute cystitis with hematuria: Secondary | ICD-10-CM | POA: Insufficient documentation

## 2018-07-11 DIAGNOSIS — I10 Essential (primary) hypertension: Secondary | ICD-10-CM | POA: Insufficient documentation

## 2018-07-11 DIAGNOSIS — Z79899 Other long term (current) drug therapy: Secondary | ICD-10-CM | POA: Insufficient documentation

## 2018-07-11 DIAGNOSIS — R112 Nausea with vomiting, unspecified: Secondary | ICD-10-CM

## 2018-07-11 DIAGNOSIS — R1084 Generalized abdominal pain: Secondary | ICD-10-CM

## 2018-07-11 DIAGNOSIS — R109 Unspecified abdominal pain: Secondary | ICD-10-CM | POA: Diagnosis present

## 2018-07-11 LAB — URINALYSIS, ROUTINE W REFLEX MICROSCOPIC
Bilirubin Urine: NEGATIVE
Glucose, UA: NEGATIVE mg/dL
Ketones, ur: 5 mg/dL — AB
Nitrite: NEGATIVE
Protein, ur: 100 mg/dL — AB
RBC / HPF: >50 RBC/hpf — ABNORMAL HIGH (ref 0–5)
Specific Gravity, Urine: 1.026 (ref 1.005–1.030)
WBC, UA: >50 WBC/hpf — ABNORMAL HIGH (ref 0–5)
pH: 5 (ref 5.0–8.0)

## 2018-07-11 LAB — COMPREHENSIVE METABOLIC PANEL
ALT: 29 U/L (ref 0–44)
AST: 22 U/L (ref 15–41)
Albumin: 4.3 g/dL (ref 3.5–5.0)
Alkaline Phosphatase: 74 U/L (ref 38–126)
Anion gap: 15 (ref 5–15)
BUN: 13 mg/dL (ref 6–20)
CO2: 24 mmol/L (ref 22–32)
Calcium: 9.9 mg/dL (ref 8.9–10.3)
Chloride: 95 mmol/L — ABNORMAL LOW (ref 98–111)
Creatinine, Ser: 0.82 mg/dL (ref 0.44–1.00)
GFR calc Af Amer: >60 mL/min (ref >60)
GFR calc non Af Amer: >60 mL/min (ref >60)
Glucose, Bld: 117 mg/dL — ABNORMAL HIGH (ref 70–99)
Potassium: 3.4 mmol/L — ABNORMAL LOW (ref 3.5–5.1)
Sodium: 134 mmol/L — ABNORMAL LOW (ref 135–145)
Total Bilirubin: 1.1 mg/dL (ref 0.3–1.2)
Total Protein: 8 g/dL (ref 6.5–8.1)

## 2018-07-11 LAB — I-STAT BETA HCG BLOOD, ED (MC, WL, AP ONLY): I-stat hCG, quantitative: <5 m[IU]/mL (ref <5)

## 2018-07-11 LAB — CBC
HCT: 43.3 % (ref 36.0–46.0)
Hemoglobin: 13.4 g/dL (ref 12.0–15.0)
MCH: 24.1 pg — ABNORMAL LOW (ref 26.0–34.0)
MCHC: 30.9 g/dL (ref 30.0–36.0)
MCV: 77.9 fL — ABNORMAL LOW (ref 78.0–100.0)
Platelets: 343 10*3/uL (ref 150–400)
RBC: 5.56 MIL/uL — ABNORMAL HIGH (ref 3.87–5.11)
RDW: 14.3 % (ref 11.5–15.5)
WBC: 10.4 10*3/uL (ref 4.0–10.5)

## 2018-07-11 LAB — LIPASE, BLOOD: Lipase: 26 U/L (ref 11–51)

## 2018-07-11 MED ORDER — PROMETHAZINE HCL 25 MG/ML IJ SOLN
25.0000 mg | Freq: Once | INTRAMUSCULAR | Status: AC
  Administered 2018-07-11: 25 mg via INTRAVENOUS
  Filled 2018-07-11: qty 1

## 2018-07-11 MED ORDER — ONDANSETRON 4 MG PO TBDP
4.0000 mg | ORAL_TABLET | Freq: Once | ORAL | Status: AC | PRN
  Administered 2018-07-11: 4 mg via ORAL
  Filled 2018-07-11: qty 1

## 2018-07-11 MED ORDER — MORPHINE SULFATE (PF) 4 MG/ML IV SOLN
4.0000 mg | Freq: Once | INTRAVENOUS | Status: AC
  Administered 2018-07-11: 4 mg via INTRAVENOUS
  Filled 2018-07-11: qty 1

## 2018-07-11 MED ORDER — SODIUM CHLORIDE 0.9 % IV BOLUS
1000.0000 mL | Freq: Once | INTRAVENOUS | Status: AC
  Administered 2018-07-11: 1000 mL via INTRAVENOUS

## 2018-07-11 NOTE — ED Triage Notes (Signed)
Pt reports left side abd pain and vomiting since Sunday. Mild diarrhea. No acute distress is noted at triage.
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
## 2018-07-11 NOTE — ED Notes (Signed)
ED Provider at bedside. 

## 2018-07-12 ENCOUNTER — Emergency Department (HOSPITAL_COMMUNITY): Payer: Medicaid Other

## 2018-07-12 ENCOUNTER — Encounter (HOSPITAL_COMMUNITY): Payer: Self-pay | Admitting: Radiology

## 2018-07-12 MED ORDER — IOHEXOL 300 MG/ML  SOLN
100.0000 mL | Freq: Once | INTRAMUSCULAR | Status: AC | PRN
  Administered 2018-07-12: 100 mL via INTRAVENOUS

## 2018-07-12 MED ORDER — CEPHALEXIN 500 MG PO CAPS: 500.0000 mg | ORAL_CAPSULE | Freq: Two times a day (BID) | ORAL | 0 refills | Status: AC

## 2018-07-12 MED ORDER — ONDANSETRON HCL 4 MG/2ML IJ SOLN
4.0000 mg | Freq: Once | INTRAMUSCULAR | Status: AC
  Administered 2018-07-12: 4 mg via INTRAVENOUS
  Filled 2018-07-12: qty 2

## 2018-07-12 MED ORDER — SODIUM CHLORIDE 0.9 % IV SOLN
1.0000 g | Freq: Once | INTRAVENOUS | Status: AC
  Administered 2018-07-12: 1 g via INTRAVENOUS
  Filled 2018-07-12: qty 10

## 2018-07-12 MED ORDER — IOPAMIDOL (ISOVUE-300) INJECTION 61%: 100.0000 mL | Freq: Once | INTRAVENOUS | Status: DC | PRN

## 2018-07-12 MED ORDER — IOPAMIDOL (ISOVUE-300) INJECTION 61%
INTRAVENOUS | Status: AC
  Filled 2018-07-12: qty 30

## 2018-07-12 MED ORDER — SODIUM CHLORIDE 0.9 % IV BOLUS
500.0000 mL | Freq: Once | INTRAVENOUS | Status: AC
  Administered 2018-07-12: 500 mL via INTRAVENOUS

## 2018-07-12 MED ORDER — HALOPERIDOL LACTATE 5 MG/ML IJ SOLN
2.0000 mg | Freq: Once | INTRAMUSCULAR | Status: AC
  Administered 2018-07-12: 2 mg via INTRAVENOUS
  Filled 2018-07-12: qty 1

## 2018-07-12 MED ORDER — DICYCLOMINE HCL 10 MG PO CAPS
10.0000 mg | ORAL_CAPSULE | Freq: Once | ORAL | Status: AC
  Administered 2018-07-12: 10 mg via ORAL
  Filled 2018-07-12: qty 1

## 2018-07-12 NOTE — ED Notes (Signed)
This RN informed pt we need urine culture. Pt verbalized understanding and said she will try to urinate after her fluids are finished.

## 2018-07-12 NOTE — ED Notes (Signed)
Patient transported to CT 

## 2018-07-12 NOTE — ED Notes (Addendum)
Pt requested water. After pt drank her water she had an episode of emesis. PA notified

## 2018-07-12 NOTE — Discharge Instructions (Signed)
You can take Tylenol or Ibuprofen as directed for pain. You can alternate Tylenol and Ibuprofen every 4 hours. If you take Tylenol at 1pm, then you can take Ibuprofen at 5pm. Then you can take Tylenol again at 9pm.   Take bentyl for abdominal pain.   Take antibiotics as directed. Please take all of your antibiotics until finished.  Up with referred GI doctor for further evaluation.  Return to emergency department for any worsening abdominal pain, fever, persistent vomiting, inability to eat or drink anything, chest pain or difficulty breathing.

## 2018-07-12 NOTE — ED Notes (Signed)
ED Provider at bedside. 

## 2018-07-12 NOTE — ED Notes (Signed)
Pt returned to room  

## 2018-07-12 NOTE — ED Notes (Signed)
Pt states she has not thrown up since phenergan given, but is still feeling nauseous. PA notified

## 2018-07-13 LAB — URINE CULTURE

## 2018-08-17 ENCOUNTER — Encounter (HOSPITAL_COMMUNITY): Payer: Self-pay

## 2018-08-17 ENCOUNTER — Ambulatory Visit (HOSPITAL_COMMUNITY)
Admission: EM | Admit: 2018-08-17 | Discharge: 2018-08-17 | Disposition: A | Discharge status: Home / Self Care | Payer: Medicaid Other | Attending: Family Medicine | Admitting: Family Medicine

## 2018-08-17 DIAGNOSIS — K0889 Other specified disorders of teeth and supporting structures: Secondary | ICD-10-CM | POA: Diagnosis not present

## 2018-08-17 MED ORDER — IBUPROFEN 800 MG PO TABS: 800.0000 mg | ORAL_TABLET | Freq: Three times a day (TID) | ORAL | 0 refills | Status: DC

## 2018-08-17 MED ORDER — HYDROCODONE-ACETAMINOPHEN 5-325 MG PO TABS
ORAL_TABLET | ORAL | Status: AC
  Filled 2018-08-17: qty 1

## 2018-08-17 MED ORDER — PENICILLIN V POTASSIUM 500 MG PO TABS: 500.0000 mg | ORAL_TABLET | Freq: Four times a day (QID) | ORAL | 0 refills | Status: AC

## 2018-08-17 MED ORDER — HYDROCODONE-ACETAMINOPHEN 5-325 MG PO TABS
1.0000 | ORAL_TABLET | Freq: Once | ORAL | Status: AC
  Administered 2018-08-17: 1 via ORAL

## 2018-08-17 NOTE — Discharge Instructions (Signed)
Complete course of antibiotics.  Don't drive tonight after the medication provided.  Ibuprofen as needed for pain.  Ice application to help with swelling.  Please follow up with a dentist for definitive treatment.
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
## 2018-08-17 NOTE — ED Triage Notes (Signed)
Pt c/o dental pain x 3 days.

## 2018-08-30 ENCOUNTER — Other Ambulatory Visit: Payer: Self-pay

## 2018-08-30 ENCOUNTER — Emergency Department
Admission: EM | Admit: 2018-08-30 | Discharge: 2018-08-30 | Disposition: A | Discharge status: Home / Self Care | Payer: Medicaid Other | Attending: Emergency Medicine | Admitting: Emergency Medicine

## 2018-08-30 ENCOUNTER — Emergency Department: Payer: Medicaid Other

## 2018-08-30 DIAGNOSIS — I10 Essential (primary) hypertension: Secondary | ICD-10-CM | POA: Insufficient documentation

## 2018-08-30 DIAGNOSIS — R1084 Generalized abdominal pain: Secondary | ICD-10-CM | POA: Insufficient documentation

## 2018-08-30 DIAGNOSIS — Z79899 Other long term (current) drug therapy: Secondary | ICD-10-CM | POA: Diagnosis not present

## 2018-08-30 DIAGNOSIS — R109 Unspecified abdominal pain: Secondary | ICD-10-CM

## 2018-08-30 LAB — URINALYSIS, COMPLETE (UACMP) WITH MICROSCOPIC
Bacteria, UA: NONE SEEN
Bilirubin Urine: NEGATIVE
Glucose, UA: NEGATIVE mg/dL
Hgb urine dipstick: NEGATIVE
Ketones, ur: NEGATIVE mg/dL
Leukocytes, UA: NEGATIVE
Nitrite: NEGATIVE
Protein, ur: NEGATIVE mg/dL
Specific Gravity, Urine: 1.018 (ref 1.005–1.030)
pH: 6 (ref 5.0–8.0)

## 2018-08-30 LAB — CBC
HCT: 41.1 % (ref 36.0–46.0)
Hemoglobin: 13.3 g/dL (ref 12.0–15.0)
MCH: 25.5 pg — ABNORMAL LOW (ref 26.0–34.0)
MCHC: 32.4 g/dL (ref 30.0–36.0)
MCV: 78.9 fL — ABNORMAL LOW (ref 80.0–100.0)
Platelets: 322 10*3/uL (ref 150–400)
RBC: 5.21 MIL/uL — ABNORMAL HIGH (ref 3.87–5.11)
RDW: 14.3 % (ref 11.5–15.5)
WBC: 12.3 10*3/uL — ABNORMAL HIGH (ref 4.0–10.5)
nRBC: 0 % (ref 0.0–0.2)

## 2018-08-30 LAB — COMPREHENSIVE METABOLIC PANEL
ALT: 25 U/L (ref 0–44)
AST: 25 U/L (ref 15–41)
Albumin: 4.4 g/dL (ref 3.5–5.0)
Alkaline Phosphatase: 69 U/L (ref 38–126)
Anion gap: 10 (ref 5–15)
BUN: 32 mg/dL — ABNORMAL HIGH (ref 6–20)
CO2: 33 mmol/L — ABNORMAL HIGH (ref 22–32)
Calcium: 9.7 mg/dL (ref 8.9–10.3)
Chloride: 91 mmol/L — ABNORMAL LOW (ref 98–111)
Creatinine, Ser: 1.45 mg/dL — ABNORMAL HIGH (ref 0.44–1.00)
GFR calc Af Amer: 55 mL/min — ABNORMAL LOW (ref >60)
GFR calc non Af Amer: 47 mL/min — ABNORMAL LOW (ref >60)
Glucose, Bld: 131 mg/dL — ABNORMAL HIGH (ref 70–99)
Potassium: 2.5 mmol/L — CL (ref 3.5–5.1)
Sodium: 134 mmol/L — ABNORMAL LOW (ref 135–145)
Total Bilirubin: 0.9 mg/dL (ref 0.3–1.2)
Total Protein: 7.9 g/dL (ref 6.5–8.1)

## 2018-08-30 LAB — DIFFERENTIAL
Abs Immature Granulocytes: 0.06 10*3/uL (ref 0.00–0.07)
Basophils Absolute: 0 10*3/uL (ref 0.0–0.1)
Basophils Relative: 0 %
Eosinophils Absolute: 0.1 10*3/uL (ref 0.0–0.5)
Eosinophils Relative: 1 %
Immature Granulocytes: 1 %
Lymphocytes Relative: 20 %
Lymphs Abs: 2.4 10*3/uL (ref 0.7–4.0)
Monocytes Absolute: 0.8 10*3/uL (ref 0.1–1.0)
Monocytes Relative: 6 %
Neutro Abs: 8.7 10*3/uL — ABNORMAL HIGH (ref 1.7–7.7)
Neutrophils Relative %: 72 %

## 2018-08-30 LAB — URINE DRUG SCREEN, QUALITATIVE (ARMC ONLY)
Amphetamines, Ur Screen: NOT DETECTED
Barbiturates, Ur Screen: NOT DETECTED
Benzodiazepine, Ur Scrn: NOT DETECTED
Cannabinoid 50 Ng, Ur ~~LOC~~: NOT DETECTED
Cocaine Metabolite,Ur ~~LOC~~: POSITIVE — AB
MDMA (Ecstasy)Ur Screen: NOT DETECTED
Methadone Scn, Ur: NOT DETECTED
Opiate, Ur Screen: POSITIVE — AB
Phencyclidine (PCP) Ur S: NOT DETECTED
Tricyclic, Ur Screen: POSITIVE — AB

## 2018-08-30 LAB — LIPASE, BLOOD: Lipase: 32 U/L (ref 11–51)

## 2018-08-30 LAB — PREGNANCY, URINE: Preg Test, Ur: NEGATIVE

## 2018-08-30 LAB — POCT PREGNANCY, URINE: Preg Test, Ur: NEGATIVE

## 2018-08-30 MED ORDER — MORPHINE SULFATE (PF) 2 MG/ML IV SOLN
2.0000 mg | Freq: Once | INTRAVENOUS | Status: AC
  Administered 2018-08-30: 2 mg via INTRAVENOUS

## 2018-08-30 MED ORDER — PROCHLORPERAZINE EDISYLATE 10 MG/2ML IJ SOLN
10.0000 mg | Freq: Once | INTRAMUSCULAR | Status: AC
  Administered 2018-08-30: 10 mg via INTRAVENOUS
  Filled 2018-08-30: qty 2

## 2018-08-30 MED ORDER — ONDANSETRON HCL 4 MG/2ML IJ SOLN
4.0000 mg | Freq: Once | INTRAMUSCULAR | Status: AC
  Administered 2018-08-30: 4 mg via INTRAVENOUS
  Filled 2018-08-30: qty 2

## 2018-08-30 MED ORDER — SODIUM CHLORIDE 0.9 % IV BOLUS
1000.0000 mL | Freq: Once | INTRAVENOUS | Status: AC
  Administered 2018-08-30: 1000 mL via INTRAVENOUS

## 2018-08-30 MED ORDER — HYDROMORPHONE HCL 1 MG/ML IJ SOLN
INTRAMUSCULAR | Status: AC
  Administered 2018-08-30: 0.5 mg via INTRAVENOUS
  Filled 2018-08-30: qty 1

## 2018-08-30 MED ORDER — MORPHINE SULFATE (PF) 4 MG/ML IV SOLN: 4.0000 mg | Freq: Once | INTRAVENOUS | Status: DC

## 2018-08-30 MED ORDER — MORPHINE SULFATE (PF) 2 MG/ML IV SOLN
INTRAVENOUS | Status: AC
  Administered 2018-08-30: 2 mg via INTRAVENOUS
  Filled 2018-08-30: qty 1

## 2018-08-30 MED ORDER — ONDANSETRON 4 MG PO TBDP: 4.0000 mg | ORAL_TABLET | Freq: Three times a day (TID) | ORAL | 0 refills | Status: DC | PRN

## 2018-08-30 MED ORDER — HYDROMORPHONE HCL 1 MG/ML IJ SOLN
0.5000 mg | Freq: Once | INTRAMUSCULAR | Status: AC
  Administered 2018-08-30: 0.5 mg via INTRAVENOUS

## 2018-08-30 MED ORDER — POTASSIUM CHLORIDE 2 MEQ/ML IV SOLN
INTRAVENOUS | Status: DC
  Administered 2018-08-30: 17:00:00 via INTRAVENOUS
  Filled 2018-08-30 (×12): qty 1000

## 2018-08-30 NOTE — ED Triage Notes (Signed)
Pt c/o generalized abd pain with N/V for the past couple of days and today having Bl flank pain , states she only urinated once yesterday with dark colored urine. Denies diarrhea.
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
## 2018-08-30 NOTE — Discharge Instructions (Signed)
Please use the Zofran melt on your tongue wafers 3 times a day as needed for nausea.  Try sips of clear liquid and small amounts frequently for now.  Clear liquid includes Jell-O and popsicles.  Also weak tea flat soda sports drinks or chicken broth are useful.  Before bed you can try that brat diet bananas rice applesauce toast and crackers and small amounts frequently.  Tomorrow morning if you are keeping things down you can try regular food.  Please return here for fever vomiting that will not stop worse pain or feeling sicker.  Please get your doctor to see you sometime in the next couple days.

## 2018-08-30 NOTE — ED Notes (Addendum)
First Nurse Note: Lab called with Critical Value - Potassium 2.5.  Erskine Squibb Geophysicist/field seismologist Nurse notified.

## 2018-08-30 NOTE — ED Notes (Signed)
Lab notified to add on differential  

## 2018-08-30 NOTE — ED Notes (Signed)
First Nurse Note: Patient to Room 14 via WC, given gown to put on and asked to undress from the waist up.  Stretcher in low position.  Lorrie RN aware of Potassium 2.5 and room placement.

## 2018-08-30 NOTE — ED Notes (Signed)
Pt ambulatory to toilet independently. 

## 2018-09-07 ENCOUNTER — Emergency Department (HOSPITAL_COMMUNITY)
Admission: EM | Admit: 2018-09-07 | Discharge: 2018-09-07 | Disposition: A | Discharge status: Home / Self Care | Payer: Medicaid Other | Attending: Emergency Medicine | Admitting: Emergency Medicine

## 2018-09-07 ENCOUNTER — Other Ambulatory Visit: Payer: Self-pay

## 2018-09-07 ENCOUNTER — Encounter (HOSPITAL_COMMUNITY): Payer: Self-pay

## 2018-09-07 DIAGNOSIS — R531 Weakness: Secondary | ICD-10-CM | POA: Insufficient documentation

## 2018-09-07 DIAGNOSIS — Z79899 Other long term (current) drug therapy: Secondary | ICD-10-CM | POA: Diagnosis not present

## 2018-09-07 DIAGNOSIS — I10 Essential (primary) hypertension: Secondary | ICD-10-CM | POA: Insufficient documentation

## 2018-09-07 LAB — CBC
HCT: 36.5 % (ref 36.0–46.0)
Hemoglobin: 10.7 g/dL — ABNORMAL LOW (ref 12.0–15.0)
MCH: 24.7 pg — ABNORMAL LOW (ref 26.0–34.0)
MCHC: 29.3 g/dL — ABNORMAL LOW (ref 30.0–36.0)
MCV: 84.3 fL (ref 80.0–100.0)
Platelets: 210 10*3/uL (ref 150–400)
RBC: 4.33 MIL/uL (ref 3.87–5.11)
RDW: 14.6 % (ref 11.5–15.5)
WBC: 8 10*3/uL (ref 4.0–10.5)
nRBC: 0 % (ref 0.0–0.2)

## 2018-09-07 LAB — BASIC METABOLIC PANEL
Anion gap: 4 — ABNORMAL LOW (ref 5–15)
BUN: 9 mg/dL (ref 6–20)
CO2: 27 mmol/L (ref 22–32)
Calcium: 9 mg/dL (ref 8.9–10.3)
Chloride: 108 mmol/L (ref 98–111)
Creatinine, Ser: 0.81 mg/dL (ref 0.44–1.00)
GFR calc Af Amer: >60 mL/min (ref >60)
GFR calc non Af Amer: >60 mL/min (ref >60)
Glucose, Bld: 109 mg/dL — ABNORMAL HIGH (ref 70–99)
Potassium: 3.5 mmol/L (ref 3.5–5.1)
Sodium: 139 mmol/L (ref 135–145)

## 2018-09-07 LAB — URINALYSIS, ROUTINE W REFLEX MICROSCOPIC
Bilirubin Urine: NEGATIVE
Glucose, UA: NEGATIVE mg/dL
Hgb urine dipstick: NEGATIVE
Ketones, ur: NEGATIVE mg/dL
Leukocytes, UA: NEGATIVE
Nitrite: NEGATIVE
Protein, ur: NEGATIVE mg/dL
Specific Gravity, Urine: 1.028 (ref 1.005–1.030)
pH: 5 (ref 5.0–8.0)

## 2018-09-07 LAB — CBG MONITORING, ED: Glucose-Capillary: 71 mg/dL (ref 70–99)

## 2018-09-07 LAB — I-STAT BETA HCG BLOOD, ED (MC, WL, AP ONLY): I-stat hCG, quantitative: <5 m[IU]/mL (ref <5)

## 2018-09-07 MED ORDER — SODIUM CHLORIDE 0.9 % IV SOLN
Freq: Once | INTRAVENOUS | Status: AC
  Administered 2018-09-07: 21:00:00 via INTRAVENOUS
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
## 2018-09-07 NOTE — Discharge Instructions (Signed)
You have been seen today for weakness and presyncope (feeling like you are going to faint). Please read and follow all provided instructions.   1. Medications: usual home medications 2. Treatment: rest, drink plenty of fluids, eat more foods high in iron including red meat, spinach, or prune juice. 3. Follow Up: Please follow up with your primary doctor in 7 days for discussion of your diagnoses and further evaluation after today's visit; if you do not have a primary care doctor use the resource guide provided to find one; Please return to the ER for any new or worsening symptoms.   Take medications as prescribed. Return to the emergency room for worsening condition or new concerning symptoms. Follow up with your regular doctor. If you don't have a regular doctor use one of the numbers below to establish a primary care doctor.   Emergency Department Resource Guide 1) Find a Doctor and Pay Out of Pocket Although you won't have to find out who is covered by your insurance plan, it is a good idea to ask around and get recommendations. You will then need to call the office and see if the doctor you have chosen will accept you as a new patient and what types of options they offer for patients who are self-pay. Some doctors offer discounts or will set up payment plans for their patients who do not have insurance, but you will need to ask so you aren't surprised when you get to your appointment.  2) Contact Your Local Health Department Not all health departments have doctors that can see patients for sick visits, but many do, so it is worth a call to see if yours does. If you don't know where your local health department is, you can check in your phone book. The CDC also has a tool to help you locate your state's health department, and many state websites also have listings of all of their local health departments.  3) Find a Walk-in Clinic If your illness is not likely to be very severe or complicated,  you may want to try a walk in clinic. These are popping up all over the country in pharmacies, drugstores, and shopping centers. They're usually staffed by nurse practitioners or physician assistants that have been trained to treat common illnesses and complaints. They're usually fairly quick and inexpensive. However, if you have serious medical issues or chronic medical problems, these are probably not your best option.  No Primary Care Doctor: Call Health Connect at  [phone] - they can help you locate a primary care doctor that  accepts your insurance, provides certain services, etc. Physician Referral [phone]  Emergency Department Resource Guide 1) Find a Doctor and Pay Out of Pocket Although you won't have to find out who is covered by your insurance plan, it is a good idea to ask around and get recommendations. You will then need to call the office and see if the doctor you have chosen will accept you as a new patient and what types of options they offer for patients who are self-pay. Some doctors offer discounts or will set up payment plans for their patients who do not have insurance, but you will need to ask so you aren't surprised when you get to your appointment.  2) Contact Your Local Health Department Not all health departments have doctors that can see patients for sick visits, but many do, so it is worth a call to see if yours does. If you don't know where your  local health department is, you can check in your phone book. The CDC also has a tool to help you locate your state's health department, and many state websites also have listings of all of their local health departments.  3) Find a Walk-in Clinic If your illness is not likely to be very severe or complicated, you may want to try a walk in clinic. These are popping up all over the country in pharmacies, drugstores, and shopping centers. They're usually staffed by nurse practitioners or physician assistants that  have been trained to treat common illnesses and complaints. They're usually fairly quick and inexpensive. However, if you have serious medical issues or chronic medical problems, these are probably not your best option.  No Primary Care Doctor: Call Health Connect at  [phone] - they can help you locate a primary care doctor that  accepts your insurance, provides certain services, etc. Physician Referral Service- [phone]  Chronic Pain Problems: Organization         Address  Phone   Notes  [facility]  [phone] Patients need to be referred by their primary care doctor.   Medication Assistance: Organization         Address  Phone   Notes  [facility] Medication [facility] [address] [place] [place], [place] [address] [phone] --Must be a resident of [facility] -- Must have NO insurance coverage whatsoever (no Medicaid/ Medicare, etc.) -- The pt. MUST have a primary care doctor that directs their care regularly and follows them in the community   [facility]  [phone]   [employer]  [phone]    Agencies that provide inexpensive medical care: Organization         Address  Phone   Notes  [facility]  [phone]   [facility]    [phone]   [facility] [address] [place], [place] [address] [phone]   [facility] [address] [place], [place] [phone]   [facility]    [phone]   [facility]    [phone]   [facility]  [address], [place] Phone:  [phone], Fax:  [phone] Hours of Operation:  9 am - 6 pm, M-F.  Also accepts Medicaid/Medicare and self-pay.  [facility]  [address], [place] Phone: [phone], Fax: [phone]. Hours of Operation:  8:30 am - 5:30 pm, M-F.  Also accepts Medicaid and  self-pay.  [facility] [place] [address], [place] Phone: [phone]   [facility] [address] Natasha Bence [place], [place] [phone], Ext. [phone] Mondays & Thursdays: 7-9 AM.  First 15 patients are seen on a first come, first serve basis.    Medicaid-accepting [facility] Providers:  Organization         Address  Phone   Notes  [facility] [address], [place] [phone] Also accepts self-pay patients.  [facility] [address] Laurell Josephs [place], [place]  [phone]   [facility] [address], [place] [phone]   [facility] [address] [place] [phone]   Renaye Rakers [address], [place]   [phone] Only accepts [place] patients after they have  their name applied to their card.   Self-Pay (no insurance) in [facility]  Organization         Address  Phone   Notes  Sickle Cell Patients, [facility] [address] [place], [place] [phone]   [facility] [address] [place], [place] [phone]   [facility] [place]  [address], [place] [phone]   [facility]/Dr. Osei-Bonsu  [address] [place] or [address], [place] [phone] Phone number for both [place] and [place] locations is the same.  [facility] and [facility] [address] [place] [phone]   [facility] [address] [place] or [address] [phone]   [facility] [address] [place] [phone], phone; [phone], fax Sees patients 1st and 3rd Saturday of every month.  Must not qualify for public or private insurance (i.e. Medicaid, Medicare, [place] Health Choice, Veterans' Benefits)  Household income should  be no more than 200% of the poverty level The clinic cannot treat you if you are pregnant or think you are pregnant  Sexually transmitted diseases are not treated at the clinic.

## 2018-11-21 ENCOUNTER — Ambulatory Visit (HOSPITAL_COMMUNITY)
Admission: EM | Admit: 2018-11-21 | Discharge: 2018-11-21 | Disposition: A | Discharge status: Home / Self Care | Payer: Medicaid Other | Attending: Family Medicine | Admitting: Family Medicine

## 2018-11-21 ENCOUNTER — Encounter (HOSPITAL_COMMUNITY): Payer: Self-pay

## 2018-11-21 DIAGNOSIS — N390 Urinary tract infection, site not specified: Secondary | ICD-10-CM | POA: Insufficient documentation

## 2018-11-21 LAB — POCT URINALYSIS DIP (DEVICE)
Bilirubin Urine: NEGATIVE
Glucose, UA: NEGATIVE mg/dL
Ketones, ur: NEGATIVE mg/dL
Nitrite: POSITIVE — AB
Protein, ur: 30 mg/dL — AB
Specific Gravity, Urine: >=1.03 (ref 1.005–1.030)
Urobilinogen, UA: 0.2 mg/dL (ref 0.0–1.0)
pH: 5 (ref 5.0–8.0)

## 2018-11-21 LAB — POCT PREGNANCY, URINE: Preg Test, Ur: NEGATIVE

## 2018-11-21 MED ORDER — CIPROFLOXACIN HCL 500 MG PO TABS: 500.0000 mg | ORAL_TABLET | Freq: Two times a day (BID) | ORAL | 0 refills | Status: DC

## 2018-11-21 NOTE — ED Triage Notes (Signed)
Pt c/o UTI s/sx's x2 wks

## 2018-11-21 NOTE — Discharge Instructions (Addendum)
Please return if symptoms don't clear in three days.
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
## 2018-11-24 LAB — URINE CULTURE: Culture: >=100000 — AB

## 2018-11-27 ENCOUNTER — Telehealth (HOSPITAL_COMMUNITY): Payer: Self-pay | Admitting: Emergency Medicine

## 2018-11-27 NOTE — Telephone Encounter (Signed)
Urine culture was positive for Escherichia coli and was given cipro  at urgent care visit. Pt contacted and made aware, educated on completing antibiotic and to follow up if symptoms are persistent.attempted call no answer and no voicemail set up

## 2019-03-01 ENCOUNTER — Ambulatory Visit (HOSPITAL_COMMUNITY)
Admission: EM | Admit: 2019-03-01 | Discharge: 2019-03-01 | Disposition: A | Discharge status: Home / Self Care | Payer: Medicaid Other | Attending: Family Medicine | Admitting: Family Medicine

## 2019-03-01 ENCOUNTER — Other Ambulatory Visit: Payer: Self-pay

## 2019-03-01 ENCOUNTER — Encounter (HOSPITAL_COMMUNITY): Payer: Self-pay | Admitting: Emergency Medicine

## 2019-03-01 DIAGNOSIS — L239 Allergic contact dermatitis, unspecified cause: Secondary | ICD-10-CM

## 2019-03-01 MED ORDER — METHYLPREDNISOLONE ACETATE 80 MG/ML IJ SUSP
80.0000 mg | Freq: Once | INTRAMUSCULAR | Status: AC
  Administered 2019-03-01: 80 mg via INTRAMUSCULAR

## 2019-03-01 MED ORDER — METHYLPREDNISOLONE ACETATE 80 MG/ML IJ SUSP
INTRAMUSCULAR | Status: AC
  Filled 2019-03-01: qty 1

## 2019-03-01 NOTE — ED Triage Notes (Signed)
?   Bites to both arms, noticed yesterday number of places increased today and areas itch badly
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
## 2019-03-01 NOTE — Discharge Instructions (Signed)
Please take zyrtec for itching  Please follow up if your symptoms do not resolve.

## 2019-03-18 ENCOUNTER — Encounter (HOSPITAL_COMMUNITY): Payer: Self-pay

## 2019-03-18 ENCOUNTER — Other Ambulatory Visit: Payer: Self-pay

## 2019-03-18 ENCOUNTER — Ambulatory Visit (HOSPITAL_COMMUNITY)
Admission: EM | Admit: 2019-03-18 | Discharge: 2019-03-18 | Disposition: A | Discharge status: Home / Self Care | Payer: Medicaid Other | Attending: Urgent Care | Admitting: Urgent Care

## 2019-03-18 DIAGNOSIS — K047 Periapical abscess without sinus: Secondary | ICD-10-CM

## 2019-03-18 DIAGNOSIS — K0889 Other specified disorders of teeth and supporting structures: Secondary | ICD-10-CM

## 2019-03-18 MED ORDER — NAPROXEN 500 MG PO TABS: 500.0000 mg | ORAL_TABLET | Freq: Two times a day (BID) | ORAL | 0 refills | Status: DC

## 2019-03-18 MED ORDER — AMOXICILLIN-POT CLAVULANATE 875-125 MG PO TABS: 1.0000 | ORAL_TABLET | Freq: Two times a day (BID) | ORAL | 0 refills | Status: DC

## 2019-03-18 NOTE — ED Triage Notes (Signed)
Pt cc she has a abscess in the roof of her mouth for 3 days now. Pt states she has been taking a lot of Tylenol and Advil for pain. She says her stomach hurts from taking those meds.

## 2019-03-18 NOTE — Discharge Instructions (Addendum)
Please make sure that you contact the dental surgeon for an urgent consult for dental abscess, periodontal infection.  In the meantime he can use naproxen for pain and inflammation and we will cover you for infection with Augmentin.  Make sure the ear eating soft foods and chewing on the right side of your mouth.
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
## 2019-05-21 ENCOUNTER — Other Ambulatory Visit: Payer: Self-pay

## 2019-05-21 ENCOUNTER — Ambulatory Visit (HOSPITAL_COMMUNITY)
Admission: EM | Admit: 2019-05-21 | Discharge: 2019-05-21 | Disposition: A | Discharge status: Home / Self Care | Payer: Medicaid Other | Attending: Family Medicine | Admitting: Family Medicine

## 2019-05-21 ENCOUNTER — Encounter (HOSPITAL_COMMUNITY): Payer: Self-pay | Admitting: Emergency Medicine

## 2019-05-21 DIAGNOSIS — N926 Irregular menstruation, unspecified: Secondary | ICD-10-CM

## 2019-05-21 DIAGNOSIS — R109 Unspecified abdominal pain: Secondary | ICD-10-CM

## 2019-05-21 DIAGNOSIS — Z3202 Encounter for pregnancy test, result negative: Secondary | ICD-10-CM

## 2019-05-21 LAB — POCT URINALYSIS DIP (DEVICE)
Bilirubin Urine: NEGATIVE
Glucose, UA: NEGATIVE mg/dL
Hgb urine dipstick: NEGATIVE
Ketones, ur: NEGATIVE mg/dL
Leukocytes,Ua: NEGATIVE
Nitrite: NEGATIVE
Protein, ur: NEGATIVE mg/dL
Specific Gravity, Urine: >=1.03 (ref 1.005–1.030)
Urobilinogen, UA: 1 mg/dL (ref 0.0–1.0)
pH: 5.5 (ref 5.0–8.0)

## 2019-05-21 LAB — POCT PREGNANCY, URINE: Preg Test, Ur: NEGATIVE

## 2019-05-21 NOTE — ED Triage Notes (Signed)
Pt sts left sided flank pain and later period; pt sts home pregnancy tests were negative
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
## 2019-05-21 NOTE — Discharge Instructions (Signed)
Negative pregnancy here today as well, consistent with your home tests.  You can repeat testing in a week if still no period.  I recommend following up with your primary care provider and/or gynecology for further evaluation if your period does not return.

## 2019-06-08 ENCOUNTER — Other Ambulatory Visit (HOSPITAL_COMMUNITY): Payer: Self-pay | Admitting: Nurse Practitioner

## 2019-06-08 ENCOUNTER — Other Ambulatory Visit: Payer: Self-pay | Admitting: Nurse Practitioner

## 2019-06-08 DIAGNOSIS — N912 Amenorrhea, unspecified: Secondary | ICD-10-CM

## 2019-06-15 ENCOUNTER — Ambulatory Visit: Payer: Medicaid Other

## 2019-06-18 ENCOUNTER — Ambulatory Visit: Payer: Medicaid Other

## 2019-07-16 ENCOUNTER — Ambulatory Visit (HOSPITAL_COMMUNITY)
Admission: EM | Admit: 2019-07-16 | Discharge: 2019-07-16 | Disposition: A | Discharge status: Home / Self Care | Payer: Medicaid Other | Attending: Family Medicine | Admitting: Family Medicine

## 2019-07-16 ENCOUNTER — Encounter (HOSPITAL_COMMUNITY): Payer: Self-pay

## 2019-07-16 ENCOUNTER — Other Ambulatory Visit: Payer: Self-pay

## 2019-07-16 DIAGNOSIS — N898 Other specified noninflammatory disorders of vagina: Secondary | ICD-10-CM | POA: Diagnosis not present

## 2019-07-16 MED ORDER — METRONIDAZOLE 500 MG PO TABS: 500.0000 mg | ORAL_TABLET | Freq: Two times a day (BID) | ORAL | 0 refills | Status: DC

## 2019-07-16 MED ORDER — FLUCONAZOLE 150 MG PO TABS: 150.0000 mg | ORAL_TABLET | Freq: Every day | ORAL | 0 refills | Status: DC

## 2019-07-16 NOTE — Discharge Instructions (Addendum)
Take the metronidazole 1 pill 2 times a day.  This will treat for bacterial vaginosis Take the Diflucan 1 pill now, and then 1 pill in a week as you finish the metronidazole.  This will treat for yeast infection The other swab will test for chlamydia/gonorrhea/trichomonas.  We will call you if anything is positive

## 2019-07-16 NOTE — ED Triage Notes (Signed)
Patient presents to Urgent Care with complaints of green and yellow vaginal discharge since 4-5 days ago.
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
## 2019-07-18 ENCOUNTER — Telehealth (HOSPITAL_COMMUNITY): Payer: Self-pay | Admitting: Emergency Medicine

## 2019-07-18 LAB — CERVICOVAGINAL ANCILLARY ONLY
Chlamydia: NEGATIVE
Neisseria Gonorrhea: NEGATIVE
Trichomonas: POSITIVE — AB

## 2019-07-18 NOTE — Telephone Encounter (Signed)
Trichomonas is positive. Rx metronidazole was given at the urgent care visit. Pt needs education to please refrain from sexual intercourse for 7 days to give the medicine time to work. Sexual partners need to be notified and tested/treated. Condoms may reduce risk of reinfection. Recheck for further evaluation if symptoms are not improving.   Attempted to reach patient. No answer at this time. Voicemail left.    

## 2019-07-20 ENCOUNTER — Telehealth (HOSPITAL_COMMUNITY): Payer: Self-pay | Admitting: Emergency Medicine

## 2019-07-20 NOTE — Telephone Encounter (Signed)
Patient contacted and made aware of swab results, all questions answered.   

## 2019-09-06 IMAGING — DX DG CHEST 2V
2 series · 2 of 2 positions shown · non-contrast
Comparison: 02/03/2016

CLINICAL DATA: Chest pain, abdominal pain, vomiting and diarrhea.
Shortness of breath.

EXAM:
CHEST  2 VIEW

[chest lat]
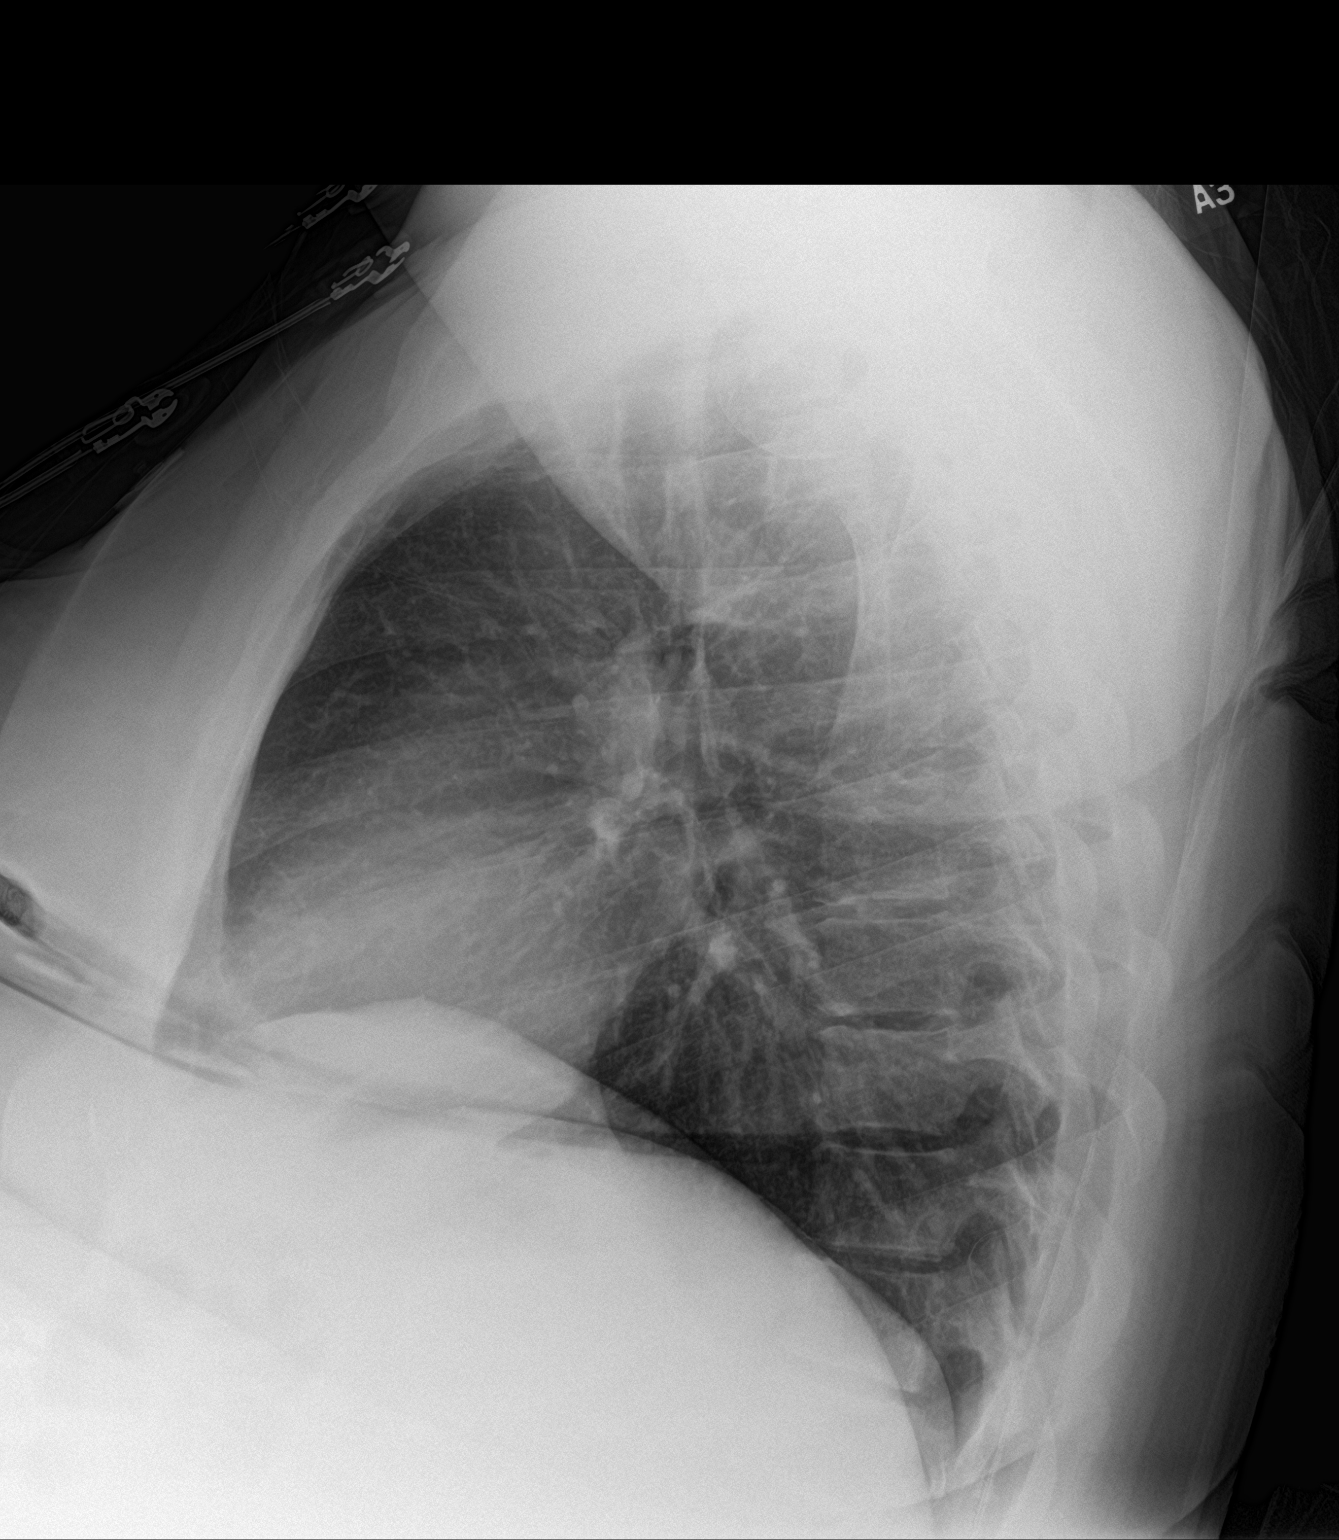

[chest ap]
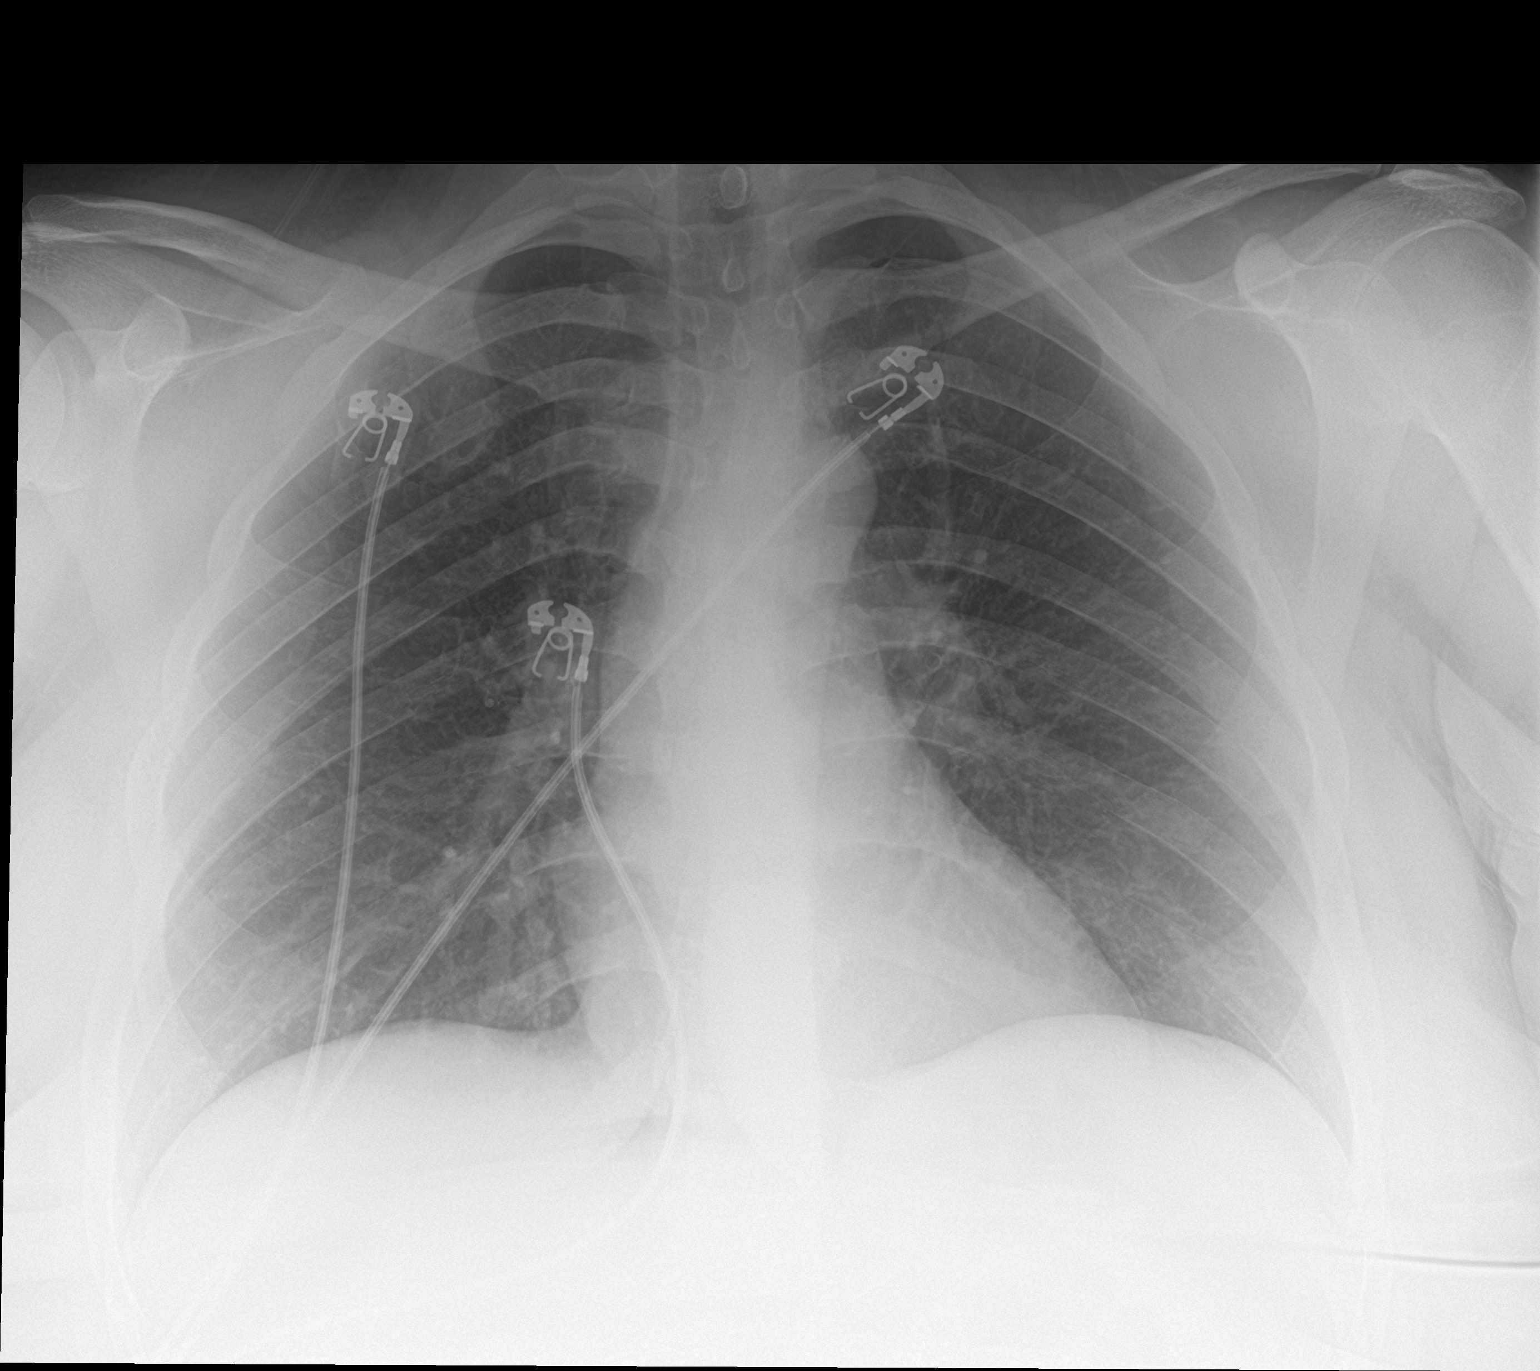

[2 of 2 positions shown; findings below may reference images not displayed]

FINDINGS: The heart size and mediastinal contours are within normal limits.
Both lungs are clear. The visualized skeletal structures are
unremarkable.
IMPRESSION: Normal chest x-ray.

## 2020-04-15 ENCOUNTER — Inpatient Hospital Stay (HOSPITAL_COMMUNITY)
Admit: 2020-04-15 | Discharge: 2020-04-17 | DRG: 683 | Disposition: A | Discharge status: Home / Self Care | Payer: Medicaid Other | Attending: Internal Medicine | Admitting: Internal Medicine

## 2020-04-15 ENCOUNTER — Observation Stay (HOSPITAL_COMMUNITY): Payer: Medicaid Other

## 2020-04-15 DIAGNOSIS — F419 Anxiety disorder, unspecified: Secondary | ICD-10-CM | POA: Diagnosis present

## 2020-04-15 DIAGNOSIS — Z8249 Family history of ischemic heart disease and other diseases of the circulatory system: Secondary | ICD-10-CM

## 2020-04-15 DIAGNOSIS — K589 Irritable bowel syndrome without diarrhea: Secondary | ICD-10-CM | POA: Diagnosis present

## 2020-04-15 DIAGNOSIS — Z79899 Other long term (current) drug therapy: Secondary | ICD-10-CM

## 2020-04-15 DIAGNOSIS — Z885 Allergy status to narcotic agent status: Secondary | ICD-10-CM

## 2020-04-15 DIAGNOSIS — R112 Nausea with vomiting, unspecified: Secondary | ICD-10-CM

## 2020-04-15 DIAGNOSIS — K219 Gastro-esophageal reflux disease without esophagitis: Secondary | ICD-10-CM | POA: Diagnosis present

## 2020-04-15 DIAGNOSIS — E860 Dehydration: Secondary | ICD-10-CM | POA: Diagnosis present

## 2020-04-15 DIAGNOSIS — B009 Herpesviral infection, unspecified: Secondary | ICD-10-CM | POA: Diagnosis present

## 2020-04-15 DIAGNOSIS — F329 Major depressive disorder, single episode, unspecified: Secondary | ICD-10-CM | POA: Diagnosis present

## 2020-04-15 DIAGNOSIS — R109 Unspecified abdominal pain: Secondary | ICD-10-CM | POA: Diagnosis not present

## 2020-04-15 DIAGNOSIS — G8929 Other chronic pain: Secondary | ICD-10-CM | POA: Diagnosis present

## 2020-04-15 DIAGNOSIS — Z9119 Patient's noncompliance with other medical treatment and regimen: Secondary | ICD-10-CM

## 2020-04-15 DIAGNOSIS — F411 Generalized anxiety disorder: Secondary | ICD-10-CM | POA: Diagnosis present

## 2020-04-15 DIAGNOSIS — A5901 Trichomonal vulvovaginitis: Secondary | ICD-10-CM | POA: Insufficient documentation

## 2020-04-15 DIAGNOSIS — Z833 Family history of diabetes mellitus: Secondary | ICD-10-CM

## 2020-04-15 DIAGNOSIS — N179 Acute kidney failure, unspecified: Secondary | ICD-10-CM | POA: Diagnosis not present

## 2020-04-15 DIAGNOSIS — Z6829 Body mass index (BMI) 29.0-29.9, adult: Secondary | ICD-10-CM

## 2020-04-15 DIAGNOSIS — Z823 Family history of stroke: Secondary | ICD-10-CM

## 2020-04-15 DIAGNOSIS — J302 Other seasonal allergic rhinitis: Secondary | ICD-10-CM | POA: Diagnosis present

## 2020-04-15 DIAGNOSIS — R1110 Vomiting, unspecified: Secondary | ICD-10-CM | POA: Diagnosis present

## 2020-04-15 DIAGNOSIS — Z800 Family history of malignant neoplasm of digestive organs: Secondary | ICD-10-CM

## 2020-04-15 DIAGNOSIS — Z91199 Patient's noncompliance with other medical treatment and regimen due to unspecified reason: Secondary | ICD-10-CM

## 2020-04-15 DIAGNOSIS — I10 Essential (primary) hypertension: Secondary | ICD-10-CM | POA: Diagnosis present

## 2020-04-15 DIAGNOSIS — Z801 Family history of malignant neoplasm of trachea, bronchus and lung: Secondary | ICD-10-CM

## 2020-04-15 DIAGNOSIS — Z791 Long term (current) use of non-steroidal anti-inflammatories (NSAID): Secondary | ICD-10-CM

## 2020-04-15 DIAGNOSIS — Z20822 Contact with and (suspected) exposure to covid-19: Secondary | ICD-10-CM | POA: Diagnosis present

## 2020-04-15 DIAGNOSIS — K580 Irritable bowel syndrome with diarrhea: Secondary | ICD-10-CM

## 2020-04-15 DIAGNOSIS — E876 Hypokalemia: Secondary | ICD-10-CM | POA: Diagnosis present

## 2020-04-15 DIAGNOSIS — R1084 Generalized abdominal pain: Secondary | ICD-10-CM | POA: Diagnosis not present

## 2020-04-15 DIAGNOSIS — E871 Hypo-osmolality and hyponatremia: Secondary | ICD-10-CM | POA: Diagnosis present

## 2020-04-15 DIAGNOSIS — E669 Obesity, unspecified: Secondary | ICD-10-CM | POA: Diagnosis present

## 2020-04-15 LAB — MAGNESIUM: Magnesium: 2.5 mg/dL — ABNORMAL HIGH (ref 1.7–2.4)

## 2020-04-15 LAB — CBC
HCT: 45.1 % (ref 36.0–46.0)
Hemoglobin: 15.2 g/dL — ABNORMAL HIGH (ref 12.0–15.0)
MCH: 27.3 pg (ref 26.0–34.0)
MCHC: 33.7 g/dL (ref 30.0–36.0)
MCV: 81.1 fL (ref 80.0–100.0)
Platelets: 246 10*3/uL (ref 150–400)
RBC: 5.56 MIL/uL — ABNORMAL HIGH (ref 3.87–5.11)
RDW: 14.1 % (ref 11.5–15.5)
WBC: 8.9 10*3/uL (ref 4.0–10.5)
nRBC: 0 % (ref 0.0–0.2)

## 2020-04-15 LAB — BASIC METABOLIC PANEL
Anion gap: 11 (ref 5–15)
BUN: 38 mg/dL — ABNORMAL HIGH (ref 6–20)
CO2: 29 mmol/L (ref 22–32)
Calcium: 8.8 mg/dL — ABNORMAL LOW (ref 8.9–10.3)
Chloride: 92 mmol/L — ABNORMAL LOW (ref 98–111)
Creatinine, Ser: 2.3 mg/dL — ABNORMAL HIGH (ref 0.44–1.00)
GFR calc Af Amer: 31 mL/min — ABNORMAL LOW (ref >60)
GFR calc non Af Amer: 27 mL/min — ABNORMAL LOW (ref >60)
Glucose, Bld: 88 mg/dL (ref 70–99)
Potassium: 3.5 mmol/L (ref 3.5–5.1)
Sodium: 132 mmol/L — ABNORMAL LOW (ref 135–145)

## 2020-04-15 LAB — URINALYSIS, ROUTINE W REFLEX MICROSCOPIC
Bilirubin Urine: NEGATIVE
Glucose, UA: NEGATIVE mg/dL
Hgb urine dipstick: NEGATIVE
Ketones, ur: NEGATIVE mg/dL
Leukocytes,Ua: NEGATIVE
Nitrite: NEGATIVE
Protein, ur: NEGATIVE mg/dL
Specific Gravity, Urine: 1.009 (ref 1.005–1.030)
pH: 5 (ref 5.0–8.0)

## 2020-04-15 LAB — RAPID URINE DRUG SCREEN, HOSP PERFORMED
Amphetamines: POSITIVE — AB
Barbiturates: NOT DETECTED
Benzodiazepines: NOT DETECTED
Cocaine: NOT DETECTED
Opiates: NOT DETECTED
Tetrahydrocannabinol: NOT DETECTED

## 2020-04-15 LAB — I-STAT BETA HCG BLOOD, ED (MC, WL, AP ONLY): I-stat hCG, quantitative: 6.7 m[IU]/mL — ABNORMAL HIGH (ref <5)

## 2020-04-15 LAB — PREGNANCY, URINE: Preg Test, Ur: NEGATIVE

## 2020-04-15 LAB — COMPREHENSIVE METABOLIC PANEL
ALT: 27 U/L (ref 0–44)
AST: 23 U/L (ref 15–41)
Albumin: 5 g/dL (ref 3.5–5.0)
Alkaline Phosphatase: 86 U/L (ref 38–126)
Anion gap: 16 — ABNORMAL HIGH (ref 5–15)
BUN: 45 mg/dL — ABNORMAL HIGH (ref 6–20)
CO2: 31 mmol/L (ref 22–32)
Calcium: 9.9 mg/dL (ref 8.9–10.3)
Chloride: 82 mmol/L — ABNORMAL LOW (ref 98–111)
Creatinine, Ser: 3.16 mg/dL — ABNORMAL HIGH (ref 0.44–1.00)
GFR calc Af Amer: 21 mL/min — ABNORMAL LOW (ref >60)
GFR calc non Af Amer: 18 mL/min — ABNORMAL LOW (ref >60)
Glucose, Bld: 99 mg/dL (ref 70–99)
Potassium: 3.1 mmol/L — ABNORMAL LOW (ref 3.5–5.1)
Sodium: 129 mmol/L — ABNORMAL LOW (ref 135–145)
Total Bilirubin: 1 mg/dL (ref 0.3–1.2)
Total Protein: 9 g/dL — ABNORMAL HIGH (ref 6.5–8.1)

## 2020-04-15 LAB — SARS CORONAVIRUS 2 BY RT PCR (HOSPITAL ORDER, PERFORMED IN ~~LOC~~ HOSPITAL LAB): SARS Coronavirus 2: NEGATIVE

## 2020-04-15 LAB — HIV ANTIBODY (ROUTINE TESTING W REFLEX): HIV Screen 4th Generation wRfx: NONREACTIVE

## 2020-04-15 LAB — TSH: TSH: 0.638 u[IU]/mL (ref 0.350–4.500)

## 2020-04-15 LAB — LIPASE, BLOOD: Lipase: 75 U/L — ABNORMAL HIGH (ref 11–51)

## 2020-04-15 MED ORDER — SODIUM CHLORIDE 0.9 % IV SOLN: INTRAVENOUS | Status: DC

## 2020-04-15 MED ORDER — DIPHENHYDRAMINE HCL 50 MG/ML IJ SOLN
12.5000 mg | Freq: Once | INTRAMUSCULAR | Status: AC
  Administered 2020-04-15: 12.5 mg via INTRAVENOUS
  Filled 2020-04-15: qty 1

## 2020-04-15 MED ORDER — ZOLPIDEM TARTRATE 5 MG PO TABS
5.0000 mg | ORAL_TABLET | Freq: Every evening | ORAL | Status: DC | PRN
  Administered 2020-04-16: 5 mg via ORAL
  Filled 2020-04-15: qty 1

## 2020-04-15 MED ORDER — DOCUSATE SODIUM 100 MG PO CAPS
100.0000 mg | ORAL_CAPSULE | Freq: Two times a day (BID) | ORAL | Status: DC
  Administered 2020-04-16 – 2020-04-17 (×2): 100 mg via ORAL
  Filled 2020-04-15 (×4): qty 1

## 2020-04-15 MED ORDER — METOCLOPRAMIDE HCL 5 MG/ML IJ SOLN
10.0000 mg | Freq: Three times a day (TID) | INTRAMUSCULAR | Status: DC
  Administered 2020-04-15 – 2020-04-16 (×3): 10 mg via INTRAVENOUS
  Filled 2020-04-15 (×3): qty 2

## 2020-04-15 MED ORDER — PANTOPRAZOLE SODIUM 40 MG IV SOLR
40.0000 mg | Freq: Once | INTRAVENOUS | Status: AC
  Administered 2020-04-15: 40 mg via INTRAVENOUS
  Filled 2020-04-15: qty 40

## 2020-04-15 MED ORDER — POTASSIUM CHLORIDE 10 MEQ/100ML IV SOLN
10.0000 meq | INTRAVENOUS | Status: AC
  Administered 2020-04-15 (×5): 10 meq via INTRAVENOUS
  Filled 2020-04-15 (×5): qty 100

## 2020-04-15 MED ORDER — ENOXAPARIN SODIUM 30 MG/0.3ML ~~LOC~~ SOLN
30.0000 mg | SUBCUTANEOUS | Status: DC
  Administered 2020-04-15: 30 mg via SUBCUTANEOUS
  Filled 2020-04-15: qty 0.3

## 2020-04-15 MED ORDER — HYDRALAZINE HCL 20 MG/ML IJ SOLN: 5.0000 mg | INTRAMUSCULAR | Status: DC | PRN

## 2020-04-15 MED ORDER — CLONAZEPAM 0.5 MG PO TABS
0.5000 mg | ORAL_TABLET | Freq: Every day | ORAL | Status: DC
  Administered 2020-04-16 – 2020-04-17 (×2): 0.5 mg via ORAL
  Filled 2020-04-15 (×3): qty 1

## 2020-04-15 MED ORDER — METOPROLOL TARTRATE 5 MG/5ML IV SOLN
7.5000 mg | Freq: Four times a day (QID) | INTRAVENOUS | Status: DC
  Administered 2020-04-15 – 2020-04-17 (×7): 7.5 mg via INTRAVENOUS
  Filled 2020-04-15 (×7): qty 10

## 2020-04-15 MED ORDER — HALOPERIDOL LACTATE 5 MG/ML IJ SOLN
2.0000 mg | Freq: Once | INTRAMUSCULAR | Status: AC
  Administered 2020-04-15: 2 mg via INTRAVENOUS
  Filled 2020-04-15: qty 1

## 2020-04-15 MED ORDER — SODIUM CHLORIDE 0.9 % IV BOLUS
1000.0000 mL | Freq: Once | INTRAVENOUS | Status: AC
  Administered 2020-04-15: 1000 mL via INTRAVENOUS

## 2020-04-15 MED ORDER — LORAZEPAM 2 MG/ML IJ SOLN
0.5000 mg | Freq: Once | INTRAMUSCULAR | Status: AC
  Administered 2020-04-15: 0.5 mg via INTRAVENOUS
  Filled 2020-04-15: qty 1

## 2020-04-15 MED ORDER — LORAZEPAM 2 MG/ML IJ SOLN
0.5000 mg | Freq: Four times a day (QID) | INTRAMUSCULAR | Status: DC | PRN
  Administered 2020-04-16 – 2020-04-17 (×2): 0.5 mg via INTRAVENOUS
  Filled 2020-04-15 (×2): qty 1

## 2020-04-15 MED ORDER — ENOXAPARIN SODIUM 40 MG/0.4ML ~~LOC~~ SOLN
40.0000 mg | SUBCUTANEOUS | Status: DC
  Filled 2020-04-15: qty 0.4

## 2020-04-15 MED ORDER — ONDANSETRON HCL 4 MG/2ML IJ SOLN
4.0000 mg | Freq: Once | INTRAMUSCULAR | Status: AC | PRN
  Administered 2020-04-16: 4 mg via INTRAVENOUS
  Filled 2020-04-15: qty 2

## 2020-04-15 MED ORDER — SODIUM CHLORIDE 0.9 % IV SOLN
25.0000 mg | Freq: Once | INTRAVENOUS | Status: AC
  Administered 2020-04-15: 25 mg via INTRAVENOUS
  Filled 2020-04-15: qty 1
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
## 2020-04-15 NOTE — ED Notes (Signed)
ED Provider at bedside. 

## 2020-04-15 NOTE — ED Notes (Signed)
Transport called to bring pt to floor.  
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
## 2020-04-15 NOTE — ED Notes (Signed)
Dr. Freida Busman states that it is okay to proceed giving med in spite of hcg results.

## 2020-04-15 NOTE — ED Triage Notes (Signed)
[place] having N/V/D x 3 days with abdominal pain. Alert and oriented.

## 2020-04-16 DIAGNOSIS — Z79899 Other long term (current) drug therapy: Secondary | ICD-10-CM | POA: Diagnosis not present

## 2020-04-16 DIAGNOSIS — Z823 Family history of stroke: Secondary | ICD-10-CM | POA: Diagnosis not present

## 2020-04-16 DIAGNOSIS — Z9119 Patient's noncompliance with other medical treatment and regimen: Secondary | ICD-10-CM | POA: Diagnosis not present

## 2020-04-16 DIAGNOSIS — E663 Overweight: Secondary | ICD-10-CM

## 2020-04-16 DIAGNOSIS — F329 Major depressive disorder, single episode, unspecified: Secondary | ICD-10-CM | POA: Diagnosis present

## 2020-04-16 DIAGNOSIS — K589 Irritable bowel syndrome without diarrhea: Secondary | ICD-10-CM | POA: Diagnosis present

## 2020-04-16 DIAGNOSIS — K219 Gastro-esophageal reflux disease without esophagitis: Secondary | ICD-10-CM | POA: Diagnosis present

## 2020-04-16 DIAGNOSIS — E860 Dehydration: Secondary | ICD-10-CM | POA: Diagnosis present

## 2020-04-16 DIAGNOSIS — E876 Hypokalemia: Secondary | ICD-10-CM | POA: Diagnosis present

## 2020-04-16 DIAGNOSIS — B009 Herpesviral infection, unspecified: Secondary | ICD-10-CM | POA: Diagnosis present

## 2020-04-16 DIAGNOSIS — G8929 Other chronic pain: Secondary | ICD-10-CM | POA: Diagnosis present

## 2020-04-16 DIAGNOSIS — Z885 Allergy status to narcotic agent status: Secondary | ICD-10-CM | POA: Diagnosis not present

## 2020-04-16 DIAGNOSIS — Z791 Long term (current) use of non-steroidal anti-inflammatories (NSAID): Secondary | ICD-10-CM | POA: Diagnosis not present

## 2020-04-16 DIAGNOSIS — I10 Essential (primary) hypertension: Secondary | ICD-10-CM | POA: Diagnosis present

## 2020-04-16 DIAGNOSIS — E871 Hypo-osmolality and hyponatremia: Secondary | ICD-10-CM | POA: Diagnosis present

## 2020-04-16 DIAGNOSIS — R109 Unspecified abdominal pain: Secondary | ICD-10-CM | POA: Diagnosis present

## 2020-04-16 DIAGNOSIS — N179 Acute kidney failure, unspecified: Secondary | ICD-10-CM | POA: Diagnosis present

## 2020-04-16 DIAGNOSIS — F419 Anxiety disorder, unspecified: Secondary | ICD-10-CM | POA: Diagnosis present

## 2020-04-16 DIAGNOSIS — Z20822 Contact with and (suspected) exposure to covid-19: Secondary | ICD-10-CM | POA: Diagnosis present

## 2020-04-16 DIAGNOSIS — Z833 Family history of diabetes mellitus: Secondary | ICD-10-CM | POA: Diagnosis not present

## 2020-04-16 DIAGNOSIS — E669 Obesity, unspecified: Secondary | ICD-10-CM | POA: Diagnosis present

## 2020-04-16 DIAGNOSIS — Z801 Family history of malignant neoplasm of trachea, bronchus and lung: Secondary | ICD-10-CM | POA: Diagnosis not present

## 2020-04-16 DIAGNOSIS — F411 Generalized anxiety disorder: Secondary | ICD-10-CM | POA: Diagnosis present

## 2020-04-16 DIAGNOSIS — J302 Other seasonal allergic rhinitis: Secondary | ICD-10-CM | POA: Diagnosis present

## 2020-04-16 DIAGNOSIS — Z800 Family history of malignant neoplasm of digestive organs: Secondary | ICD-10-CM | POA: Diagnosis not present

## 2020-04-16 DIAGNOSIS — Z8249 Family history of ischemic heart disease and other diseases of the circulatory system: Secondary | ICD-10-CM | POA: Diagnosis not present

## 2020-04-16 LAB — COMPREHENSIVE METABOLIC PANEL
ALT: 16 U/L (ref 0–44)
AST: 10 U/L — ABNORMAL LOW (ref 15–41)
Albumin: 3.6 g/dL (ref 3.5–5.0)
Alkaline Phosphatase: 61 U/L (ref 38–126)
Anion gap: 8 (ref 5–15)
BUN: 26 mg/dL — ABNORMAL HIGH (ref 6–20)
CO2: 27 mmol/L (ref 22–32)
Calcium: 8.6 mg/dL — ABNORMAL LOW (ref 8.9–10.3)
Chloride: 97 mmol/L — ABNORMAL LOW (ref 98–111)
Creatinine, Ser: 1.16 mg/dL — ABNORMAL HIGH (ref 0.44–1.00)
GFR calc Af Amer: >60 mL/min (ref >60)
GFR calc non Af Amer: >60 mL/min (ref >60)
Glucose, Bld: 93 mg/dL (ref 70–99)
Potassium: 3.5 mmol/L (ref 3.5–5.1)
Sodium: 132 mmol/L — ABNORMAL LOW (ref 135–145)
Total Bilirubin: 1.1 mg/dL (ref 0.3–1.2)
Total Protein: 6.8 g/dL (ref 6.5–8.1)

## 2020-04-16 LAB — LIPASE, BLOOD: Lipase: 120 U/L — ABNORMAL HIGH (ref 11–51)

## 2020-04-16 LAB — MAGNESIUM: Magnesium: 2.1 mg/dL (ref 1.7–2.4)

## 2020-04-16 LAB — PHOSPHORUS: Phosphorus: 2.1 mg/dL — ABNORMAL LOW (ref 2.5–4.6)

## 2020-04-16 LAB — HCG, SERUM, QUALITATIVE: Preg, Serum: NEGATIVE

## 2020-04-16 MED ORDER — LORAZEPAM 0.5 MG PO TABS
0.5000 mg | ORAL_TABLET | Freq: Four times a day (QID) | ORAL | Status: DC | PRN
  Filled 2020-04-16: qty 1

## 2020-04-16 MED ORDER — POTASSIUM CHLORIDE CRYS ER 20 MEQ PO TBCR
40.0000 meq | EXTENDED_RELEASE_TABLET | Freq: Once | ORAL | Status: AC
  Administered 2020-04-16: 40 meq via ORAL
  Filled 2020-04-16: qty 2

## 2020-04-16 MED ORDER — TRAZODONE HCL 50 MG PO TABS
25.0000 mg | ORAL_TABLET | Freq: Every evening | ORAL | Status: DC | PRN
  Administered 2020-04-16: 25 mg via ORAL
  Filled 2020-04-16: qty 1

## 2020-04-16 MED ORDER — ZOLPIDEM TARTRATE 5 MG PO TABS: 10.0000 mg | ORAL_TABLET | Freq: Every evening | ORAL | Status: DC | PRN

## 2020-04-16 MED ORDER — ZOLPIDEM TARTRATE 5 MG PO TABS: 5.0000 mg | ORAL_TABLET | Freq: Once | ORAL | Status: DC

## 2020-04-16 MED ORDER — ZOLPIDEM TARTRATE 5 MG PO TABS: 5.0000 mg | ORAL_TABLET | Freq: Every evening | ORAL | Status: DC | PRN

## 2020-04-16 MED ORDER — SODIUM PHOSPHATES 45 MMOLE/15ML IV SOLN
20.0000 mmol | Freq: Once | INTRAVENOUS | Status: AC
  Administered 2020-04-16: 20 mmol via INTRAVENOUS
  Filled 2020-04-16: qty 6.67

## 2020-04-16 NOTE — Progress Notes (Signed)
Patient requested MD be called about PRN ambien, ativan and klonopin. The patient was requesting extra doses. MD did change orders for medication but due to hospital policy unable to give 10 mg of ambien. Trazodone was ordered and medication given to patient.

## 2020-04-16 NOTE — Progress Notes (Addendum)
PROGRESS NOTE    [name]  OEV:[id number] DOB: [birth year] DOA: 04/15/2020 PCP: Fayrene Helper, NP   Brief Narrative:  HPI per Dr. Carolyne Littles on 04/15/20 [name] is a 34 y.o. WF PMHx Generalized Anxiety Disorder, Depression, HTN, Medical noncompliance, IBS, Hx intractable nausea and vomiting, Herpes Simplex Type I infection  history of chronic abdominal pain presents with emesis and midepigastric cramping. Does have a history of IBS as well as chronic pain. Has been seen by GI for this multiple times in has been referred to pain management for which she has not done. Her pain is been persistent and worse when she tries eat. No urinary symptoms. No vaginal bleeding or discharge. No treatment use prior to arrival.  Patient states no precipitating factors just worsened about 2 days ago began to have frequent bouts of nausea and vomiting.  Unable to keep any fluid/food down felt dehydrated.  **Interim History Abdominal pain is improved and she is wanting to try diet.  She has been advanced from a clear liquid diet to full liquid diet and if she does well she will be a soft diet and anticipating discharging home in the a.m.  Assessment & Plan:   Principal Problem:   Chronic abdominal pain Active Problems:   Generalized anxiety disorder   Nausea and vomiting   Dehydration   Benign essential hypertension   Herpes simplex type 1 infection   Irritable bowel syndrome   Obesity   Intractable vomiting   History of noncompliance with medical treatment   AKI (acute kidney injury) (HCC)   Acute on Chronic Abdominal pain, improving  -Patient seen for chronic abdominal pain 02/22/2019 by [facility] Dr. Melvia Heaps work-up did not reveal specific cause, no treatment recommended at that time.. -Patient dehydrated on admission so started on normal saline 157ml/hr and have decreased to 75 mL/hr -If no serious underlying issues found as cause for abdominal pain, have  patient follow-up with Dr. Melvia Heaps [facility] IBS as an outpatiet  -6/15 KUB Negative ,  -Will consider obtaining abdominal/pelvis CT if she is not improving but will hold off for now -Diet Advancement and will go from CLD -> FULL for Lunch and if tolerates will go to Soft for Dinner and anticipate D/C home in the AM   Irritable bowel syndrome -See Above  -Abd is improved -C/w Docusate 100 mg po BID   Nausea and vomiting, improving -Initially started hydration with normal saline 166ml/hr and have reduced rate to 75 MLS per hour -Hemoglobin A1c pending -Continue with antiemetics with Ondansetron -Yesterday on 6/15 she was started on Reglan IV 10 mg TID and will stop this later today -Hold on narcotic medication as this will make patients symptoms worse -Also given Chloropromazine 25 mg IV x1 yesterday  Dehydration -See above and will continue IV fluid hydration -Improving -We will advance diet and she is currently on a full code diet will go to soft diet later this evening  Essential HTN -Continue metoprolol IV 7.5 mg QID currently -Does not appear to be taking any antihypertensives at home -Continue Hydralazine 5 mg every 4 hours as needed for systolic blood pressure of 140 or diastolic blood pressure in 100  -Blood pressure has been remained stable and is now 130/86; got as high as 168/115  AKI -Patient's BUNs/creatinine on admission was 45/3.16 and is now improved with IV fluid hydration and today it is 26/1.16 -Continue to hydrate aggressively and will reduce the rate to 75  MLS per hour-avoid nephrotoxic medications, contrast dyes, hypotension and renally dose medications -Continue to monitor and trend renal function panel and repeat CMP in a.m.  Generalized Anxiety DO/Depression -Continue Clonazepam 0.5 mg daily -Patient did not appear to be on a depression medication prior to admission.   -Continue with IV Ativan 0.5 to 1 every 6 hours PRN, when patient tolerates oral  meds transition to an SSRI  Noncompliance with medical treatment -Counseled patient on need to take medication as prescribed.   -May need a visit from LCSW to determine if there are any resources available to her.  Hypokalemia -Continue to keep Potassium goal> 4 -K+ on Admission was 3.1; -Replete with Potassium IV 50 mEQ yesterday -Potassium is now 3.5 -We will replete with p.o. potassium chloride 40 mEq  -Continue monitor replete as necessary -Repeat CMP in the a.m.  Hyponatremia -Most likely secondary to dehydration -Sodium is improving and went from 129 -> 132 -Patient being rehydrated with normal saline at 125 mL/hr and will decrease rate to 75 mL/hr -Also replete with IV Sodium Phos 20 mmol   Pregnancy, ruled out -I-STAT hCG mildly positive at 6.7; Will repeat Serum Pregnancy Test -Serum Pregnancy Teste NEGATIVE -Stat urine pregnancy test negative  Hypophosphatemia -Patient's Phos Level was 2.1 -Replete with IV Sodium Phos 20 mmol -Continue to Monitor and Replete as Necessary -Repeat Phos Level in the AM  Overweight -Estimated body mass index is 29.78 kg/m as calculated from the following:   Height as of this encounter: 5\' 6"  (1.676 m).   Weight as of this encounter: 83.7 kg. -Continued Weight Loss and Dietary Counseling  DVT prophylaxis: Enoxaparin 40 mg sq q24h Code Status: FULL CODE  Family Communication: No family present at bedside  Disposition Plan: Anticipate D/C home in the AM  Status is: Observation  The patient will require care spanning > 2 midnights and should be moved to inpatient because: Persistent severe electrolyte disturbances, IV treatments appropriate due to intensity of illness or inability to take PO and Inpatient level of care appropriate due to severity of illness  Dispo: The patient is from: Home              Anticipated d/c is to: Home              Anticipated d/c date is: 1 day              Patient currently is not medically stable  to d/c.  Consultants:   None   Procedures:  None   Antimicrobials:  Anti-infectives (From admission, onward)   None     Subjective: Seen and examined at bedside and states that she is feeling much better.  She is a little bit sleepy but states that her nausea vomiting is improved and she is not have any abdominal pain today.  Denies any lightheadedness or dizziness.  Hopeful that she can tolerate her diet.  Diet is advanced to full liquid diet will go to a soft diet in the evening.  If she tolerates this she can likely be discharged in the a.m.  Objective: Vitals:   04/15/20 2055 04/15/20 2341 04/16/20 0011 04/16/20 0510  BP: 127/87 121/78 131/81 130/86  Pulse: 95 84 87 86  Resp: 16  18 16   Temp: 99.4 F (37.4 C)  98.8 F (37.1 C) 99.1 F (37.3 C)  TempSrc:      SpO2: 100%  99% 99%  Weight:      Height:  Intake/Output Summary (Last 24 hours) at 04/16/2020 0811 Last data filed at 04/15/2020 1844 Gross per 24 hour  Intake 2866.68 ml  Output --  Net 2866.68 ml   Filed Weights   04/15/20 1700  Weight: 83.7 kg   Examination: Physical Exam:  Constitutional: WN/WD overweight Caucasian female currently in NAD and appears calm and comfortable Eyes: Lids and conjunctivae normal, sclerae anicteric  ENMT: External Ears, Nose appear normal. Grossly normal hearing Neck: Appears normal, supple, no cervical masses, normal ROM, no appreciable thyromegaly; no JVD Respiratory: Diminished to auscultation bilaterally, no wheezing, rales, rhonchi or crackles. Normal respiratory effort and patient is not tachypenic. No accessory muscle use.  Unlabored breathing Cardiovascular: RRR, no murmurs / rubs / gallops. S1 and S2 auscultated. No extremity edema.  Abdomen: Soft, non-tender, distended secondary to body habitus. Bowel sounds positive.  GU: Deferred. Musculoskeletal: No clubbing / cyanosis of digits/nails. No joint deformity upper and lower extremities.  Skin: No rashes,  lesions, ulcers but she has diffusely scattered tattoos throughout her body. No induration; Warm and dry.  Neurologic: CN 2-12 grossly intact with no focal deficits. Romberg sign and cerebellar reflexes not assessed.  Psychiatric: Normal judgment and insight. Alert and oriented x 3. Normal mood and appropriate affect.   Data Reviewed: I have personally reviewed following labs and imaging studies  CBC: Recent Labs  Lab 04/15/20 1034  WBC 8.9  HGB 15.2*  HCT 45.1  MCV 81.1  PLT 246   Basic Metabolic Panel: Recent Labs  Lab 04/15/20 1034 04/15/20 1724 04/16/20 0338  NA 129* 132* 132*  K 3.1* 3.5 3.5  CL 82* 92* 97*  CO2 31 29 27   GLUCOSE 99 88 93  BUN 45* 38* 26*  CREATININE 3.16* 2.30* 1.16*  CALCIUM 9.9 8.8* 8.6*  MG  --  2.5* 2.1  PHOS  --   --  2.1*   GFR: Estimated Creatinine Clearance: 74.5 mL/min (A) (by C-G formula based on SCr of 1.16 mg/dL (H)). Liver Function Tests: Recent Labs  Lab 04/15/20 1034 04/16/20 0338  AST 23 10*  ALT 27 16  ALKPHOS 86 61  BILITOT 1.0 1.1  PROT 9.0* 6.8  ALBUMIN 5.0 3.6   Recent Labs  Lab 04/15/20 1034  LIPASE 75*   No results for input(s): AMMONIA in the last 168 hours. Coagulation Profile: No results for input(s): INR, PROTIME in the last 168 hours. Cardiac Enzymes: No results for input(s): CKTOTAL, CKMB, CKMBINDEX, TROPONINI in the last 168 hours. BNP (last 3 results) No results for input(s): PROBNP in the last 8760 hours. HbA1C: No results for input(s): HGBA1C in the last 72 hours. CBG: No results for input(s): GLUCAP in the last 168 hours. Lipid Profile: No results for input(s): CHOL, HDL, LDLCALC, TRIG, CHOLHDL, LDLDIRECT in the last 72 hours. Thyroid Function Tests: Recent Labs    04/15/20 1724  TSH 0.638   Anemia Panel: No results for input(s): VITAMINB12, FOLATE, FERRITIN, TIBC, IRON, RETICCTPCT in the last 72 hours. Sepsis Labs: No results for input(s): PROCALCITON, LATICACIDVEN in the last 168  hours.  Recent Results (from the past 240 hour(s))  SARS Coronavirus 2 by RT PCR (hospital order, performed in [facility] hospital lab) Nasopharyngeal Nasopharyngeal Swab     Status: None   Collection Time: 04/15/20 12:47 PM   Specimen: Nasopharyngeal Swab  Result Value Ref Range Status   SARS Coronavirus 2 NEGATIVE NEGATIVE Final    Comment: (NOTE) SARS-CoV-2 target nucleic acids are NOT DETECTED.  The SARS-CoV-2 RNA  is generally detectable in upper and lower respiratory specimens during the acute phase of infection. The lowest concentration of SARS-CoV-2 viral copies this assay can detect is 250 copies / mL. A negative result does not preclude SARS-CoV-2 infection and should not be used as the sole basis for treatment or other patient management decisions.  A negative result may occur with improper specimen collection / handling, submission of specimen other than nasopharyngeal swab, presence of viral mutation(s) within the areas targeted by this assay, and inadequate number of viral copies (<250 copies / mL). A negative result must be combined with clinical observations, patient history, and epidemiological information.  Fact Sheet for Patients:   BoilerBrush.com.cy  Fact Sheet for Healthcare Providers: https://pope.com/  This test is not yet approved or  cleared by the [place] FDA and has been authorized for detection and/or diagnosis of SARS-CoV-2 by FDA under an Emergency Use Authorization (EUA).  This EUA will remain in effect (meaning this test can be used) for the duration of the COVID-19 declaration under Section 564(b)(1) of the Act, 21 U.S.C. section 360bbb-3(b)(1), unless the authorization is terminated or revoked sooner.  Performed at [facility], [address], [place], [place] [address]      RN Pressure Injury Documentation:     Estimated body mass index is 29.78 kg/m as calculated  from the following:   Height as of this encounter: 5\' 6"  (1.676 m).   Weight as of this encounter: 83.7 kg.  Malnutrition Type:      Malnutrition Characteristics:      Nutrition Interventions:    Radiology Studies: DG Abd 1 View  Result Date: 04/15/2020 CLINICAL DATA:  Nausea and vomiting EXAM: ABDOMEN - 1 VIEW COMPARISON:  08/30/2018 FINDINGS: The bowel gas pattern is normal. No radio-opaque calculi or other significant radiographic abnormality are seen. IMPRESSION: Negative. Electronically Signed   By: 09/01/2018 M.D.   On: 04/15/2020 16:57   Scheduled Meds: . clonazePAM  0.5 mg Oral Daily  . docusate sodium  100 mg Oral BID  . enoxaparin (LOVENOX) injection  40 mg Subcutaneous Q24H  . metoCLOPramide (REGLAN) injection  10 mg Intravenous Q8H  . metoprolol tartrate  7.5 mg Intravenous Q6H   Continuous Infusions: . sodium chloride 125 mL/hr at 04/16/20 0213  . sodium phosphate  Dextrose 5% IVPB       LOS: 0 days   04/18/20, DO [facility] PAGER is on AMION  If 7PM-7AM, please contact night-coverage [email or web address]

## 2020-04-17 LAB — PHOSPHORUS: Phosphorus: 2.2 mg/dL — ABNORMAL LOW (ref 2.5–4.6)

## 2020-04-17 LAB — CBC WITH DIFFERENTIAL/PLATELET
Abs Immature Granulocytes: 0.03 10*3/uL (ref 0.00–0.07)
Basophils Absolute: 0 10*3/uL (ref 0.0–0.1)
Basophils Relative: 0 %
Eosinophils Absolute: 0 10*3/uL (ref 0.0–0.5)
Eosinophils Relative: 1 %
HCT: 36.6 % (ref 36.0–46.0)
Hemoglobin: 11.9 g/dL — ABNORMAL LOW (ref 12.0–15.0)
Immature Granulocytes: 0 %
Lymphocytes Relative: 41 %
Lymphs Abs: 3.2 10*3/uL (ref 0.7–4.0)
MCH: 27 pg (ref 26.0–34.0)
MCHC: 32.5 g/dL (ref 30.0–36.0)
MCV: 83 fL (ref 80.0–100.0)
Monocytes Absolute: 0.5 10*3/uL (ref 0.1–1.0)
Monocytes Relative: 7 %
Neutro Abs: 3.9 10*3/uL (ref 1.7–7.7)
Neutrophils Relative %: 51 %
Platelets: 198 10*3/uL (ref 150–400)
RBC: 4.41 MIL/uL (ref 3.87–5.11)
RDW: 13.7 % (ref 11.5–15.5)
WBC: 7.6 10*3/uL (ref 4.0–10.5)
nRBC: 0 % (ref 0.0–0.2)

## 2020-04-17 LAB — COMPREHENSIVE METABOLIC PANEL
ALT: 19 U/L (ref 0–44)
AST: 16 U/L (ref 15–41)
Albumin: 3.9 g/dL (ref 3.5–5.0)
Alkaline Phosphatase: 70 U/L (ref 38–126)
Anion gap: 8 (ref 5–15)
BUN: 19 mg/dL (ref 6–20)
CO2: 23 mmol/L (ref 22–32)
Calcium: 8.8 mg/dL — ABNORMAL LOW (ref 8.9–10.3)
Chloride: 101 mmol/L (ref 98–111)
Creatinine, Ser: 0.99 mg/dL (ref 0.44–1.00)
GFR calc Af Amer: >60 mL/min (ref >60)
GFR calc non Af Amer: >60 mL/min (ref >60)
Glucose, Bld: 106 mg/dL — ABNORMAL HIGH (ref 70–99)
Potassium: 3.6 mmol/L (ref 3.5–5.1)
Sodium: 132 mmol/L — ABNORMAL LOW (ref 135–145)
Total Bilirubin: 0.6 mg/dL (ref 0.3–1.2)
Total Protein: 6.9 g/dL (ref 6.5–8.1)

## 2020-04-17 LAB — HEMOGLOBIN A1C
Hgb A1c MFr Bld: 5.2 % (ref 4.8–5.6)
Mean Plasma Glucose: 103 mg/dL

## 2020-04-17 LAB — MAGNESIUM: Magnesium: 1.8 mg/dL (ref 1.7–2.4)

## 2020-04-17 MED ORDER — CLONAZEPAM 0.5 MG PO TABS
0.5000 mg | ORAL_TABLET | Freq: Once | ORAL | Status: AC
  Administered 2020-04-17: 0.5 mg via ORAL
  Filled 2020-04-17: qty 1

## 2020-04-17 MED ORDER — PROMETHAZINE HCL 25 MG/ML IJ SOLN
12.5000 mg | Freq: Four times a day (QID) | INTRAMUSCULAR | Status: DC | PRN
  Administered 2020-04-17: 12.5 mg via INTRAVENOUS
  Filled 2020-04-17: qty 1

## 2020-04-17 MED ORDER — SODIUM PHOSPHATES 45 MMOLE/15ML IV SOLN
21.0000 mmol | Freq: Once | INTRAVENOUS | Status: AC
  Administered 2020-04-17: 21 mmol via INTRAVENOUS
  Filled 2020-04-17: qty 7

## 2020-04-17 MED ORDER — MAGNESIUM SULFATE 2 GM/50ML IV SOLN: 2.0000 g | Freq: Once | INTRAVENOUS | Status: DC

## 2020-04-17 MED ORDER — DOCUSATE SODIUM 100 MG PO CAPS: 100.0000 mg | ORAL_CAPSULE | Freq: Two times a day (BID) | ORAL | 0 refills | Status: DC

## 2020-04-17 NOTE — Discharge Summary (Signed)
Physician Discharge Summary  [name] ZOX:[id number] DOB: 10-08-1986 DOA: 04/15/2020  PCP: Fayrene HelperBoswell, Chelsa H, NP  Admit date: 04/15/2020 Discharge date: 04/17/2020  Admitted From: Home Disposition:  Home  Recommendations for Outpatient Follow-up:  1. Follow up with PCP in 1-2 weeks 2. Follow up Dr. Dulce [name] within 1-2 weeks 3. Please obtain CMP/CBC, Mag, Phosin one week 4. Please follow up on the following pending results:  Home Health: No Equipment/Devices: None    Discharge Condition: Stable CODE STATUS: FULL CODE  Diet recommendation: Heart Healthy Soft Diet   Brief/Interim Summary: HPI per Dr. Carolyne Littlesurtis [name] on 04/15/20 Yvonne C Shoresis a 34 y.o.WF PMHxGeneralizedAnxietyDisorder,Depression, HTN,Medical noncompliance, IBS, Hx intractable nausea and vomiting,HerpesSimplexType I infection  history of chronic abdominal pain presents with emesis and midepigastric cramping. Does have a history of IBS as well as chronic pain. Has been seen by [name] for this multiple times in has been referred to pain management for which she has not done. Her pain is been persistent and worse when she tries eat. No urinary symptoms. No vaginal bleeding or discharge. No treatment use prior to arrival.Patient states no precipitating factors just worsened about 2 days ago began to have frequent bouts of nausea and vomiting. Unable to keep any fluid/food down felt dehydrated.  **Interim History Abdominal pain is improved and she is wanting to try diet.  She has been advanced from a clear liquid diet to full liquid diet and if she does well she will be a soft diet and is improved and stable to D/C Home. She will need to follow up with [facility]CP [facility] Dulce [name] within 1 to 2 weeks  Discharge Diagnoses:  Principal Problem:   Chronic abdominal pain Active Problems:   Generalized anxiety disorder   Nausea and vomiting   Dehydration   Benign essential hypertension    Herpes simplex type 1 infection   Irritable bowel syndrome   Obesity   Intractable vomiting   History of noncompliance with medical treatment   AKI (acute kidney injury) (HCC)  Acute onChronic Abdominal pain, improved -Patient seen for chronic abdominal pain 02/22/2019 [facility]. Dalbert Batmanobert [name] did not reveal specific cause, no treatment recommended at that time.. -Patient dehydrated on admission so started on normal saline 14125ml/hr and have decreased to 75 mL/hr -If no serious underlying issues found as cause for abdominal pain, have patient follow-up with [name] as an outpatiet  however she sees Dr. Willis ModenaWilliam [name] and she will need to follow-up with [name] in outpatient setting -6/15 KUB Negative ,  -Will consider obtaining abdominal/pelvis CT if she is not improving but will hold off for now -Diet Advancement and will go from CLD -> FULL for Lunch and if tolerates will go to Soft for Dinner and will D/C home in the AM    Irritable bowel syndrome -See Above  -Abd is improved -C/w Docusate 100 mg po BID   Nausea and vomiting, improving -Initially started hydration with normal saline11425ml/hr and have reduced rate to 75 MLS per hour -Hemoglobin A1c 5.2 -Continue with antiemetics with Ondansetron -Yesterday on 6/15 she was started on Reglan IV 10 mgTID and will stop this later today -Hold on narcotic medication as this will make patients symptoms worse -Also given Chloropromazine 25 mg IV x1 yesterday  Dehydration, improved -See above and will continue IV fluid hydration -Improving -We will advance diet and she is currently on a full code diet will go to soft diet later this evening  Essential HTN -Continue metoprolol IV 7.5 mgQID currently -Does not appear to be taking any antihypertensives at home -Continue Hydralazine 5 mg every 4 hours as needed for systolic blood pressure of 140 or diastolic blood pressure in 100 -Blood  pressure has been remained stable and is now 115/106; got as high as 168/115 -Follow up with PCP within 1 week   AKI -Patient's BUNs/creatinine on admission was 45/3.16 and is now improved with IV fluid hydration and today it is 19/0.99 -Continue to hydrate aggressively and will reduce the rate to 75 MLS per hour-avoid nephrotoxic medications, contrast dyes, hypotension and renally dose medications -Continue to monitor and trend renal function panel and repeat CMP within 1 week  GeneralizedAnxiety DO/Depression -Continue Clonazepam 0.5 mg daily -Patient did not appear to be on a depression medication prior to admission.  -Continue with IV Ativan 0.5 to 1 every 6 hours PRN, when patient tolerates oral meds transition to an SSRI  Noncompliance with medical treatment -Counseled patient on need to take medication as prescribed.  -May need a visit from LCSW to determine if there are any resources available to her.  Hypokalemia -Continue to keep Potassium goal>4 -K+ on Admission was 3.1; -Replete with Potassium IV 50 mEQ yesterday -Potassium is now 3.6 -Continue monitor replete as necessary -Repeat CMP within 1 week  Hyponatremia -Most likely secondary to dehydration -Sodium is improving and went from 129 -> 132 -> and is stable  -Patient being rehydrated with normal saline at 125 mL/hr and will decrease rate to 75 mL/hr -Also replete with IV Sodium Phos 20 mmol  -Continue to Monitor and Follow up within 1 week   Pregnancy, ruled out -I-STAT hCG mildly positive at 6.7; Will repeat Serum Pregnancy Test -Serum Pregnancy Teste NEGATIVE -Stat urine pregnancy testnegative  Hypophosphatemia -Patient's Phos Level was 2.2 -Replete with IV Sodium Phos 20 mmol prior to DC -Continue to Monitor and Replete as Necessary -Repeat Phos Level in the AM  Overweight -Estimated body mass index is 29.78 kg/m as calculated from the following:   Height as of this encounter: 5\' 6"  (1.676  m).   Weight as of this encounter: 83.7 kg. -Continued Weight Loss and Dietary Counseling  Discharge Instructions  Discharge Instructions    Call MD for:  difficulty breathing, headache or visual disturbances   Complete by: As directed    Call MD for:  extreme fatigue   Complete by: As directed    Call MD for:  hives   Complete by: As directed    Call MD for:  persistant dizziness or light-headedness   Complete by: As directed    Call MD for:  persistant nausea and vomiting   Complete by: As directed    Call MD for:  redness, tenderness, or signs of infection (pain, swelling, redness, odor or green/yellow discharge around incision site)   Complete by: As directed    Call MD for:  severe uncontrolled pain   Complete by: As directed    Call MD for:  temperature >100.4   Complete by: As directed    Diet - low sodium heart healthy   Complete by: As directed    Discharge instructions   Complete by: As directed    You were cared for by a hospitalist during your [name] stay. If you have any questions about your discharge medications or the care you received while you were in the [name] after you are discharged, you can call the unit and ask to speak with the  hospitalist on call if the hospitalist that took care of you is not available. Once you are discharged, your primary care physician will handle any further medical issues. Please note that NO REFILLS for any discharge medications will be authorized once you are discharged, as it is imperative that you return to your primary care physician (or establish a relationship with a primary care physician if you do not have one) for your aftercare needs so that they can reassess your need for medications and monitor your lab values.  Follow up with PCP and Gastroenterology Dr. Dulce Sellar. Take all medications as prescribed. If symptoms change or worsen please return to the ED for evaluation   Increase activity slowly   Complete by: As directed       Allergies as of 04/17/2020      Reactions   Morphine And Related Other (See Comments)   Irritation      Medication List    STOP taking these medications   fluconazole 150 MG tablet Commonly known as: Diflucan   ibuprofen 800 MG tablet Commonly known as: ADVIL   metroNIDAZOLE 500 MG tablet Commonly known as: FLAGYL   naproxen 500 MG tablet Commonly known as: NAPROSYN     TAKE these medications   clonazePAM 0.5 MG tablet Commonly known as: KLONOPIN Take 0.5 mg by mouth daily as needed for anxiety.   docusate sodium 100 MG capsule Commonly known as: COLACE Take 1 capsule (100 mg total) by mouth 2 (two) times daily.   fexofenadine 180 MG tablet Commonly known as: ALLEGRA Take 180 mg by mouth daily.   Vitamin D (Ergocalciferol) 1.25 MG (50000 UNIT) Caps capsule Commonly known as: DRISDOL Take 50,000 Units by mouth once a week. Take on Sundays.   zolpidem 10 MG tablet Commonly known as: AMBIEN Take 10 mg by mouth at bedtime. for sleep       Allergies  Allergen Reactions  . Morphine And Related Other (See Comments)    Irritation    Consultations:  None  Procedures/Studies: DG Abd 1 View  Result Date: 04/15/2020 CLINICAL DATA:  Nausea and vomiting EXAM: ABDOMEN - 1 VIEW COMPARISON:  08/30/2018 FINDINGS: The bowel gas pattern is normal. No radio-opaque calculi or other significant radiographic abnormality are seen. IMPRESSION: Negative. Electronically Signed   By: Jasmine Pang M.D.   On: 04/15/2020 16:57   Subjective: Seen and examined and she is much improved.  No nausea or vomiting.  Denies any lightheadedness or dizziness.  Feeling well.  Ready to go home.  No questions prior to discharge and she only to follow-up with PCP as well as [name] in the outpatient setting.  Discharge Exam: Vitals:   04/17/20 0000 04/17/20 0507  BP: (!) 156/82 (!) 155/106  Pulse: 88 82  Resp:  18  Temp:  98.9 F (37.2 C)  SpO2:  100%   Vitals:   04/16/20 1704  04/16/20 2032 04/17/20 0000 04/17/20 0507  BP: 131/82 (!) 148/92 (!) 156/82 (!) 155/106  Pulse: 91 91 88 82  Resp: 16 18  18   Temp:  98.4 F (36.9 C)  98.9 F (37.2 C)  TempSrc:    Oral  SpO2:  100%  100%  Weight:      Height:       General: Pt is alert, awake, not in acute distress Cardiovascular: RRR, S1/S2 +, no rubs, no gallops Respiratory: CTA bilaterally, no wheezing, no rhonchi Abdominal: Soft, NT, distended secondary body habitus, bowel sounds + Extremities: no edema,  no cyanosis  The results of significant diagnostics from this hospitalization (including imaging, microbiology, ancillary and laboratory) are listed below for reference.    Microbiology: Recent Results (from the past 240 hour(s))  SARS Coronavirus 2 by RT PCR ([name] order, performed in [name] lab) Nasopharyngeal Nasopharyngeal Swab     Status: None   Collection Time: 04/15/20 12:47 PM   Specimen: Nasopharyngeal Swab  Result Value Ref Range Status   SARS Coronavirus 2 NEGATIVE NEGATIVE Final    Comment: (NOTE) SARS-CoV-2 target nucleic acids are NOT DETECTED.  The SARS-CoV-2 RNA is generally detectable in upper and lower respiratory specimens during the acute phase of infection. The lowest concentration of SARS-CoV-2 viral copies this assay can detect is 250 copies / mL. A negative result does not preclude SARS-CoV-2 infection and should not be used as the sole basis for treatment or other patient management decisions.  A negative result may occur with improper specimen collection / handling, submission of specimen other than nasopharyngeal swab, presence of viral mutation(s) within the areas targeted by this assay, and inadequate number of viral copies (<250 copies / mL). A negative result must be combined with clinical observations, patient history, and epidemiological information.  Fact Sheet for Patients:   StrictlyIdeas.no  Fact Sheet for Healthcare  Providers: BankingDealers.co.za  This test is not yet approved or  cleared by the [place] FDA and has been authorized for detection and/or diagnosis of SARS-CoV-2 by FDA under an Emergency Use Authorization (EUA).  This EUA will remain in effect (meaning this test can be used) for the duration of the COVID-19 declaration under Section 564(b)(1) of the Act, 21 U.S.C. section 360bbb-3(b)(1), unless the authorization is terminated or revoked sooner.  Performed at [facility], [place] [address], [place], [place] [address]     Labs: BNP (last 3 results) No results for input(s): BNP in the last 8760 hours. Basic Metabolic Panel: Recent Labs  Lab 04/15/20 1034 04/15/20 1724 04/16/20 0338 04/17/20 0409  NA 129* 132* 132* 132*  K 3.1* 3.5 3.5 3.6  CL 82* 92* 97* 101  CO2 31 29 27 23   GLUCOSE 99 88 93 106*  BUN 45* 38* 26* 19  CREATININE 3.16* 2.30* 1.16* 0.99  CALCIUM 9.9 8.8* 8.6* 8.8*  MG  --  2.5* 2.1 1.8  PHOS  --   --  2.1* 2.2*   Liver Function Tests: Recent Labs  Lab 04/15/20 1034 04/16/20 0338 04/17/20 0409  AST 23 10* 16  ALT 27 16 19   ALKPHOS 86 61 70  BILITOT 1.0 1.1 0.6  PROT 9.0* 6.8 6.9  ALBUMIN 5.0 3.6 3.9   Recent Labs  Lab 04/15/20 1034 04/16/20 0338  LIPASE 75* 120*   No results for input(s): AMMONIA in the last 168 hours. CBC: Recent Labs  Lab 04/15/20 1034 04/17/20 0409  WBC 8.9 7.6  NEUTROABS  --  3.9  HGB 15.2* 11.9*  HCT 45.1 36.6  MCV 81.1 83.0  PLT 246 198   Cardiac Enzymes: No results for input(s): CKTOTAL, CKMB, CKMBINDEX, TROPONINI in the last 168 hours. BNP: Invalid input(s): POCBNP CBG: No results for input(s): GLUCAP in the last 168 hours. D-Dimer No results for input(s): DDIMER in the last 72 hours. Hgb A1c Recent Labs    04/15/20 1724  HGBA1C 5.2   Lipid Profile No results for input(s): CHOL, HDL, LDLCALC, TRIG, CHOLHDL, LDLDIRECT in the last 72 hours. Thyroid  function studies Recent Labs    04/15/20 1724  TSH 0.638   Anemia work up No results for input(s): VITAMINB12, FOLATE, FERRITIN, TIBC, IRON, RETICCTPCT in the last 72 hours. Urinalysis    Component Value Date/Time   COLORURINE YELLOW 04/15/2020 1312   APPEARANCEUR HAZY (A) 04/15/2020 1312   APPEARANCEUR Cloudy (A) 10/04/2016 1533   LABSPEC 1.009 04/15/2020 1312   LABSPEC 1.011 02/27/2015 0035   PHURINE 5.0 04/15/2020 1312   GLUCOSEU NEGATIVE 04/15/2020 1312   GLUCOSEU Negative 02/27/2015 0035   HGBUR NEGATIVE 04/15/2020 1312   BILIRUBINUR NEGATIVE 04/15/2020 1312   BILIRUBINUR Negative 10/04/2016 1533   BILIRUBINUR Negative 02/27/2015 0035   KETONESUR NEGATIVE 04/15/2020 1312   PROTEINUR NEGATIVE 04/15/2020 1312   UROBILINOGEN 1.0 05/21/2019 1959   NITRITE NEGATIVE 04/15/2020 1312   LEUKOCYTESUR NEGATIVE 04/15/2020 1312   LEUKOCYTESUR Negative 02/27/2015 0035   Sepsis Labs Invalid input(s): PROCALCITONIN,  WBC,  LACTICIDVEN Microbiology Recent Results (from the past 240 hour(s))  SARS Coronavirus 2 by RT PCR ([name] order, performed in [name] lab) Nasopharyngeal Nasopharyngeal Swab     Status: None   Collection Time: 04/15/20 12:47 PM   Specimen: Nasopharyngeal Swab  Result Value Ref Range Status   SARS Coronavirus 2 NEGATIVE NEGATIVE Final    Comment: (NOTE) SARS-CoV-2 target nucleic acids are NOT DETECTED.  The SARS-CoV-2 RNA is generally detectable in upper and lower respiratory specimens during the acute phase of infection. The lowest concentration of SARS-CoV-2 viral copies this assay can detect is 250 copies / mL. A negative result does not preclude SARS-CoV-2 infection and should not be used as the sole basis for treatment or other patient management decisions.  A negative result may occur with improper specimen collection / handling, submission of specimen other than nasopharyngeal swab, presence of viral mutation(s) within the areas targeted  by this assay, and inadequate number of viral copies (<250 copies / mL). A negative result must be combined with clinical observations, patient history, and epidemiological information.  Fact Sheet for Patients:   BoilerBrush.com.cy  Fact Sheet for Healthcare Providers: https://pope.com/  This test is not yet approved or  cleared by the [place] FDA and has been authorized for detection and/or diagnosis of SARS-CoV-2 by FDA under an Emergency Use Authorization (EUA).  This EUA will remain in effect (meaning this test can be used) for the duration of the COVID-19 declaration under Section 564(b)(1) of the Act, 21 U.S.C. section 360bbb-3(b)(1), unless the authorization is terminated or revoked sooner.  Performed at [facility], [address], [place], [place] [address]    Time coordinating discharge: 35 minutes  SIGNED:  Merlene Laughter, DO [facility] 04/17/2020, 7:30 PM Pager is on AMION  If 7PM-7AM, please contact night-coverage [email or web address]

## 2020-04-17 NOTE — Progress Notes (Signed)
Patient requested MD be notified due to insomnia medication not being effective. OT order for klonopin obtained.

## 2020-04-17 NOTE — Progress Notes (Signed)
Patient requested for MD to be called again about medication. Asked for phenergan. MD ordered PRN dose. See orders.

## 2020-04-18 LAB — HEMOGLOBIN A1C
Hgb A1c MFr Bld: 5.2 % (ref 4.8–5.6)
Mean Plasma Glucose: 103 mg/dL

## 2020-05-23 ENCOUNTER — Other Ambulatory Visit: Payer: Self-pay

## 2020-05-23 ENCOUNTER — Ambulatory Visit (HOSPITAL_COMMUNITY)
Admission: EM | Admit: 2020-05-23 | Discharge: 2020-05-23 | Disposition: A | Discharge status: Home / Self Care | Payer: Medicaid Other | Attending: Family Medicine | Admitting: Family Medicine

## 2020-05-23 ENCOUNTER — Encounter (HOSPITAL_COMMUNITY): Payer: Self-pay | Admitting: Emergency Medicine

## 2020-05-23 DIAGNOSIS — K047 Periapical abscess without sinus: Secondary | ICD-10-CM

## 2020-05-23 DIAGNOSIS — M5432 Sciatica, left side: Secondary | ICD-10-CM | POA: Diagnosis not present

## 2020-05-23 MED ORDER — HYDROCODONE-ACETAMINOPHEN 5-325 MG PO TABS: 1.0000 | ORAL_TABLET | Freq: Four times a day (QID) | ORAL | 0 refills | Status: DC | PRN

## 2020-05-23 MED ORDER — AMOXICILLIN 875 MG PO TABS
875.0000 mg | ORAL_TABLET | Freq: Two times a day (BID) | ORAL | 0 refills | Status: DC
Start: 2020-05-23 — End: 2020-06-17

## 2020-05-23 MED ORDER — TIZANIDINE HCL 4 MG PO TABS: 4.0000 mg | ORAL_TABLET | Freq: Four times a day (QID) | ORAL | 0 refills | Status: DC | PRN

## 2020-05-23 MED ORDER — METHYLPREDNISOLONE 4 MG PO TBPK
ORAL_TABLET | ORAL | 0 refills | Status: DC
Start: 2020-05-23 — End: 2020-06-17
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
## 2020-05-23 NOTE — ED Triage Notes (Addendum)
Pt c/o lower back pain on left side onset 4-5 days ago. Pt states that pain is radiating down left leg and foot and her foot has been numb. Pt states foot numbness is like pins and needles. Pt also c/o lump on the roof of her mouth that she is concerned about infection.

## 2020-05-23 NOTE — Discharge Instructions (Addendum)
Take the Medrol Dosepak for the sciatica.  This will take down the nerve inflammation Take tizanidine as needed as a muscle relaxer this will help you rest Take pain medicine if needed for severe pain Do not drive on pain medication Take the antibiotic 2 times a day for a week See your doctor if not improving by Monday

## 2020-05-29 ENCOUNTER — Ambulatory Visit (HOSPITAL_COMMUNITY)
Admission: EM | Admit: 2020-05-29 | Discharge: 2020-05-29 | Disposition: A | Discharge status: Home / Self Care | Payer: Medicaid Other | Attending: Family Medicine | Admitting: Family Medicine

## 2020-05-29 ENCOUNTER — Other Ambulatory Visit: Payer: Self-pay

## 2020-05-29 ENCOUNTER — Encounter (HOSPITAL_COMMUNITY): Payer: Self-pay

## 2020-05-29 DIAGNOSIS — M5432 Sciatica, left side: Secondary | ICD-10-CM | POA: Diagnosis not present

## 2020-05-29 DIAGNOSIS — F1911 Other psychoactive substance abuse, in remission: Secondary | ICD-10-CM | POA: Diagnosis present

## 2020-05-29 MED ORDER — KETOROLAC TROMETHAMINE 30 MG/ML IJ SOLN
30.0000 mg | Freq: Once | INTRAMUSCULAR | Status: AC
  Administered 2020-05-29: 30 mg via INTRAMUSCULAR

## 2020-05-29 MED ORDER — KETOROLAC TROMETHAMINE 30 MG/ML IJ SOLN
INTRAMUSCULAR | Status: AC
  Filled 2020-05-29: qty 1

## 2020-05-29 NOTE — ED Triage Notes (Signed)
Pt presents for follow up with ongoing left side lower back pain that radiates down into her hip and leg leaving it with a feeling of numbness and tingling along with the pain that is unrelieved with prescribed medication.
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
## 2020-05-29 NOTE — Discharge Instructions (Addendum)
Call your doctor in the morning.  You need to talk about a referral for your back.  Possible sports medicine, physical therapy, orthopedics.

## 2020-06-05 ENCOUNTER — Other Ambulatory Visit: Payer: Self-pay | Admitting: Physician Assistant

## 2020-06-05 DIAGNOSIS — R202 Paresthesia of skin: Secondary | ICD-10-CM

## 2020-06-05 DIAGNOSIS — R200 Anesthesia of skin: Secondary | ICD-10-CM

## 2020-06-12 ENCOUNTER — Emergency Department (HOSPITAL_COMMUNITY)
Admission: EM | Admit: 2020-06-12 | Discharge: 2020-06-12 | Disposition: A | Discharge status: Home / Self Care | Payer: Medicaid Other | Attending: Emergency Medicine | Admitting: Emergency Medicine

## 2020-06-12 ENCOUNTER — Encounter (HOSPITAL_COMMUNITY): Payer: Self-pay

## 2020-06-12 ENCOUNTER — Other Ambulatory Visit: Payer: Self-pay

## 2020-06-12 DIAGNOSIS — R109 Unspecified abdominal pain: Secondary | ICD-10-CM | POA: Diagnosis not present

## 2020-06-12 DIAGNOSIS — I10 Essential (primary) hypertension: Secondary | ICD-10-CM | POA: Diagnosis not present

## 2020-06-12 DIAGNOSIS — M549 Dorsalgia, unspecified: Secondary | ICD-10-CM | POA: Diagnosis not present

## 2020-06-12 DIAGNOSIS — T402X1A Poisoning by other opioids, accidental (unintentional), initial encounter: Secondary | ICD-10-CM | POA: Insufficient documentation

## 2020-06-12 DIAGNOSIS — Z3A00 Weeks of gestation of pregnancy not specified: Secondary | ICD-10-CM | POA: Diagnosis not present

## 2020-06-12 DIAGNOSIS — O9A219 Injury, poisoning and certain other consequences of external causes complicating pregnancy, unspecified trimester: Secondary | ICD-10-CM | POA: Diagnosis not present

## 2020-06-12 DIAGNOSIS — T40601A Poisoning by unspecified narcotics, accidental (unintentional), initial encounter: Secondary | ICD-10-CM

## 2020-06-12 DIAGNOSIS — Z3201 Encounter for pregnancy test, result positive: Secondary | ICD-10-CM

## 2020-06-12 LAB — RAPID URINE DRUG SCREEN, HOSP PERFORMED
Amphetamines: NOT DETECTED
Barbiturates: NOT DETECTED
Benzodiazepines: NOT DETECTED
Cocaine: NOT DETECTED
Opiates: NOT DETECTED
Tetrahydrocannabinol: POSITIVE — AB

## 2020-06-12 LAB — I-STAT BETA HCG BLOOD, ED (MC, WL, AP ONLY): I-stat hCG, quantitative: 137.6 m[IU]/mL — ABNORMAL HIGH (ref <5)

## 2020-06-12 LAB — PREGNANCY, URINE: Preg Test, Ur: POSITIVE — AB

## 2020-06-12 LAB — ETHANOL: Alcohol, Ethyl (B): <10 mg/dL (ref <10)

## 2020-06-12 NOTE — Discharge Instructions (Addendum)
You were seen today after an accidental overdose.  You were incidentally found to be pregnant.  You are likely in the very early stages of pregnancy.  You need to follow-up with an OB/GYN.  You were provided with this information.  You will also be provided with resources regarding detox services.
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
## 2020-06-16 ENCOUNTER — Encounter (HOSPITAL_COMMUNITY): Payer: Self-pay | Admitting: Obstetrics & Gynecology

## 2020-06-16 ENCOUNTER — Inpatient Hospital Stay (HOSPITAL_COMMUNITY): Payer: Medicaid Other

## 2020-06-16 ENCOUNTER — Inpatient Hospital Stay (HOSPITAL_COMMUNITY)
Admission: AD | Admit: 2020-06-16 | Discharge: 2020-06-17 | Disposition: A | Discharge status: Home / Self Care | Payer: Medicaid Other | Attending: Obstetrics & Gynecology | Admitting: Obstetrics & Gynecology

## 2020-06-16 DIAGNOSIS — Z8719 Personal history of other diseases of the digestive system: Secondary | ICD-10-CM | POA: Diagnosis not present

## 2020-06-16 DIAGNOSIS — K219 Gastro-esophageal reflux disease without esophagitis: Secondary | ICD-10-CM | POA: Insufficient documentation

## 2020-06-16 DIAGNOSIS — M549 Dorsalgia, unspecified: Secondary | ICD-10-CM | POA: Insufficient documentation

## 2020-06-16 DIAGNOSIS — Z79899 Other long term (current) drug therapy: Secondary | ICD-10-CM | POA: Diagnosis not present

## 2020-06-16 DIAGNOSIS — O99891 Other specified diseases and conditions complicating pregnancy: Secondary | ICD-10-CM | POA: Diagnosis not present

## 2020-06-16 DIAGNOSIS — O020 Blighted ovum and nonhydatidiform mole: Secondary | ICD-10-CM

## 2020-06-16 DIAGNOSIS — O10911 Unspecified pre-existing hypertension complicating pregnancy, first trimester: Secondary | ICD-10-CM | POA: Diagnosis not present

## 2020-06-16 DIAGNOSIS — O99341 Other mental disorders complicating pregnancy, first trimester: Secondary | ICD-10-CM | POA: Insufficient documentation

## 2020-06-16 DIAGNOSIS — Z9049 Acquired absence of other specified parts of digestive tract: Secondary | ICD-10-CM | POA: Diagnosis not present

## 2020-06-16 DIAGNOSIS — Z8249 Family history of ischemic heart disease and other diseases of the circulatory system: Secondary | ICD-10-CM | POA: Diagnosis not present

## 2020-06-16 DIAGNOSIS — Z885 Allergy status to narcotic agent status: Secondary | ICD-10-CM | POA: Insufficient documentation

## 2020-06-16 DIAGNOSIS — Z888 Allergy status to other drugs, medicaments and biological substances status: Secondary | ICD-10-CM | POA: Insufficient documentation

## 2020-06-16 DIAGNOSIS — F419 Anxiety disorder, unspecified: Secondary | ICD-10-CM | POA: Diagnosis not present

## 2020-06-16 DIAGNOSIS — Z7952 Long term (current) use of systemic steroids: Secondary | ICD-10-CM | POA: Insufficient documentation

## 2020-06-16 DIAGNOSIS — O99611 Diseases of the digestive system complicating pregnancy, first trimester: Secondary | ICD-10-CM | POA: Insufficient documentation

## 2020-06-16 DIAGNOSIS — Z3A00 Weeks of gestation of pregnancy not specified: Secondary | ICD-10-CM | POA: Diagnosis not present

## 2020-06-16 DIAGNOSIS — O26891 Other specified pregnancy related conditions, first trimester: Secondary | ICD-10-CM | POA: Insufficient documentation

## 2020-06-16 DIAGNOSIS — Z3A09 9 weeks gestation of pregnancy: Secondary | ICD-10-CM | POA: Diagnosis not present

## 2020-06-16 DIAGNOSIS — R109 Unspecified abdominal pain: Secondary | ICD-10-CM | POA: Diagnosis not present

## 2020-06-16 LAB — WET PREP, GENITAL
Clue Cells Wet Prep HPF POC: NONE SEEN
Sperm: NONE SEEN
Trich, Wet Prep: NONE SEEN
Yeast Wet Prep HPF POC: NONE SEEN
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
## 2020-06-16 NOTE — MAU Note (Signed)
Pt reports to MAU stating she is early pregnant and is having some abdominal pain. Pt reports the pain is a 7/10. No bleeding or LOF. Pt reports her LMP was in June but it was short.

## 2020-06-17 DIAGNOSIS — O020 Blighted ovum and nonhydatidiform mole: Secondary | ICD-10-CM | POA: Diagnosis not present

## 2020-06-17 DIAGNOSIS — O26891 Other specified pregnancy related conditions, first trimester: Secondary | ICD-10-CM

## 2020-06-17 DIAGNOSIS — R109 Unspecified abdominal pain: Secondary | ICD-10-CM

## 2020-06-17 DIAGNOSIS — Z3A00 Weeks of gestation of pregnancy not specified: Secondary | ICD-10-CM | POA: Diagnosis not present

## 2020-06-17 LAB — URINALYSIS, ROUTINE W REFLEX MICROSCOPIC
Bacteria, UA: NONE SEEN
Glucose, UA: NEGATIVE mg/dL
Hgb urine dipstick: NEGATIVE
Ketones, ur: 5 mg/dL — AB
Leukocytes,Ua: NEGATIVE
Nitrite: NEGATIVE
Protein, ur: 30 mg/dL — AB
Specific Gravity, Urine: 1.028 (ref 1.005–1.030)
pH: 5 (ref 5.0–8.0)

## 2020-06-17 LAB — CBC
HCT: 36.4 % (ref 36.0–46.0)
Hemoglobin: 11.9 g/dL — ABNORMAL LOW (ref 12.0–15.0)
MCH: 29 pg (ref 26.0–34.0)
MCHC: 32.7 g/dL (ref 30.0–36.0)
MCV: 88.6 fL (ref 80.0–100.0)
Platelets: 250 10*3/uL (ref 150–400)
RBC: 4.11 MIL/uL (ref 3.87–5.11)
RDW: 16.1 % — ABNORMAL HIGH (ref 11.5–15.5)
WBC: 9 10*3/uL (ref 4.0–10.5)
nRBC: 0 % (ref 0.0–0.2)

## 2020-06-17 LAB — HCG, QUANTITATIVE, PREGNANCY: hCG, Beta Chain, Quant, S: 1135 m[IU]/mL — ABNORMAL HIGH (ref <5)

## 2020-06-17 LAB — ABO/RH: ABO/RH(D): O POS

## 2020-06-17 LAB — GC/CHLAMYDIA PROBE AMP (~~LOC~~) NOT AT ARMC
Chlamydia: NEGATIVE
Comment: NEGATIVE
Comment: NORMAL
Neisseria Gonorrhea: NEGATIVE

## 2020-06-19 ENCOUNTER — Other Ambulatory Visit (INDEPENDENT_AMBULATORY_CARE_PROVIDER_SITE_OTHER): Payer: Medicaid Other | Admitting: *Deleted

## 2020-06-19 ENCOUNTER — Other Ambulatory Visit: Payer: Self-pay | Admitting: *Deleted

## 2020-06-19 ENCOUNTER — Other Ambulatory Visit: Payer: Self-pay

## 2020-06-19 VITALS — BP 128/77 | HR 106

## 2020-06-19 DIAGNOSIS — O26891 Other specified pregnancy related conditions, first trimester: Secondary | ICD-10-CM

## 2020-06-19 DIAGNOSIS — R109 Unspecified abdominal pain: Secondary | ICD-10-CM

## 2020-06-19 LAB — BETA HCG QUANT (REF LAB): hCG Quant: 2787 m[IU]/mL

## 2020-06-19 MED ORDER — PRENATE PIXIE 10-0.6-0.4-200 MG PO CAPS: 1.0000 | ORAL_CAPSULE | Freq: Every day | ORAL | 11 refills | Status: DC

## 2020-06-19 NOTE — Progress Notes (Signed)
Pt is in office today for STAT Hcg after recent hospital visit.  Pt denies any bleeding, pain or cramping today.   States she is doing well.   BP 128/77   Pulse (!) 106   LMP 04/13/2020 (Approximate)   Pt made aware she will be contacted with results and plan.

## 2020-06-19 NOTE — Progress Notes (Signed)
Patient was assessed and managed by nursing staff during this encounter. I have reviewed the chart and agree with the documentation and plan.  Sharyon Cable, CNM 06/19/2020 3:54 PM

## 2020-06-19 NOTE — Progress Notes (Signed)
Spoke with pt about Hcg results.  Pt made aware to keep u/s appt as quant has increased- per V.Rogers.  Pt has been made aware of her NOB appts as well.  PNV as been sent per pt request.  Pt has no other concerns.

## 2020-07-01 ENCOUNTER — Ambulatory Visit: Admission: RE | Admit: 2020-07-01 | Payer: Medicaid Other | Source: Ambulatory Visit

## 2020-07-04 ENCOUNTER — Ambulatory Visit
Admission: RE | Admit: 2020-07-04 | Discharge: 2020-07-04 | Disposition: A | Discharge status: Home / Self Care | Payer: Medicaid Other | Source: Ambulatory Visit | Attending: Obstetrics and Gynecology | Admitting: Obstetrics and Gynecology

## 2020-07-04 ENCOUNTER — Other Ambulatory Visit: Payer: Self-pay

## 2020-07-04 ENCOUNTER — Telehealth: Payer: Self-pay | Admitting: Medical

## 2020-07-04 DIAGNOSIS — O020 Blighted ovum and nonhydatidiform mole: Secondary | ICD-10-CM | POA: Insufficient documentation

## 2020-07-04 NOTE — Telephone Encounter (Signed)
Called and spoke with Antony Contras about [place] results from today. I confirmed the patient identity using two identifiers. Patient plans to start OB case as scheduled at [facility]. Appointments are already scheduled. First trimester warning signs discussed. Patient voiced understanding.   Vonzella Nipple, PA-C 07/04/2020 3:26 PM

## 2020-07-28 ENCOUNTER — Encounter: Payer: Medicaid Other | Admitting: Obstetrics and Gynecology

## 2020-08-12 ENCOUNTER — Encounter: Payer: Medicaid Other | Admitting: Obstetrics

## 2020-08-24 ENCOUNTER — Emergency Department (HOSPITAL_COMMUNITY)
Admission: EM | Admit: 2020-08-24 | Discharge: 2020-08-24 | Disposition: A | Discharge status: Home / Self Care | Payer: Medicaid Other | Attending: Obstetrics & Gynecology | Admitting: Obstetrics & Gynecology

## 2020-08-24 ENCOUNTER — Other Ambulatory Visit: Payer: Self-pay

## 2020-08-24 ENCOUNTER — Encounter (HOSPITAL_COMMUNITY): Payer: Self-pay | Admitting: Emergency Medicine

## 2020-08-24 DIAGNOSIS — O99322 Drug use complicating pregnancy, second trimester: Secondary | ICD-10-CM | POA: Diagnosis not present

## 2020-08-24 DIAGNOSIS — Z3A14 14 weeks gestation of pregnancy: Secondary | ICD-10-CM | POA: Insufficient documentation

## 2020-08-24 DIAGNOSIS — O26892 Other specified pregnancy related conditions, second trimester: Secondary | ICD-10-CM | POA: Insufficient documentation

## 2020-08-24 DIAGNOSIS — O99352 Diseases of the nervous system complicating pregnancy, second trimester: Secondary | ICD-10-CM | POA: Insufficient documentation

## 2020-08-24 DIAGNOSIS — O219 Vomiting of pregnancy, unspecified: Secondary | ICD-10-CM | POA: Diagnosis not present

## 2020-08-24 DIAGNOSIS — O10912 Unspecified pre-existing hypertension complicating pregnancy, second trimester: Secondary | ICD-10-CM | POA: Diagnosis not present

## 2020-08-24 DIAGNOSIS — O99320 Drug use complicating pregnancy, unspecified trimester: Secondary | ICD-10-CM | POA: Diagnosis present

## 2020-08-24 DIAGNOSIS — G8929 Other chronic pain: Secondary | ICD-10-CM | POA: Diagnosis not present

## 2020-08-24 DIAGNOSIS — Z79899 Other long term (current) drug therapy: Secondary | ICD-10-CM | POA: Insufficient documentation

## 2020-08-24 DIAGNOSIS — R109 Unspecified abdominal pain: Secondary | ICD-10-CM | POA: Diagnosis present

## 2020-08-24 LAB — COMPREHENSIVE METABOLIC PANEL
ALT: 17 U/L (ref 0–44)
AST: 20 U/L (ref 15–41)
Albumin: 3.4 g/dL — ABNORMAL LOW (ref 3.5–5.0)
Alkaline Phosphatase: 47 U/L (ref 38–126)
Anion gap: 11 (ref 5–15)
BUN: <5 mg/dL — ABNORMAL LOW (ref 6–20)
CO2: 22 mmol/L (ref 22–32)
Calcium: 9.3 mg/dL (ref 8.9–10.3)
Chloride: 100 mmol/L (ref 98–111)
Creatinine, Ser: 0.61 mg/dL (ref 0.44–1.00)
GFR, Estimated: >60 mL/min (ref >60)
Glucose, Bld: 130 mg/dL — ABNORMAL HIGH (ref 70–99)
Potassium: 3.8 mmol/L (ref 3.5–5.1)
Sodium: 133 mmol/L — ABNORMAL LOW (ref 135–145)
Total Bilirubin: 0.7 mg/dL (ref 0.3–1.2)
Total Protein: 6.8 g/dL (ref 6.5–8.1)

## 2020-08-24 LAB — CBC
HCT: 41 % (ref 36.0–46.0)
Hemoglobin: 13.5 g/dL (ref 12.0–15.0)
MCH: 28.9 pg (ref 26.0–34.0)
MCHC: 32.9 g/dL (ref 30.0–36.0)
MCV: 87.8 fL (ref 80.0–100.0)
Platelets: 192 10*3/uL (ref 150–400)
RBC: 4.67 MIL/uL (ref 3.87–5.11)
RDW: 12.6 % (ref 11.5–15.5)
WBC: 9.9 10*3/uL (ref 4.0–10.5)
nRBC: 0 % (ref 0.0–0.2)

## 2020-08-24 LAB — RAPID URINE DRUG SCREEN, HOSP PERFORMED
Amphetamines: POSITIVE — AB
Barbiturates: NOT DETECTED
Benzodiazepines: NOT DETECTED
Cocaine: NOT DETECTED
Opiates: NOT DETECTED
Tetrahydrocannabinol: NOT DETECTED

## 2020-08-24 LAB — URINALYSIS, ROUTINE W REFLEX MICROSCOPIC
Bilirubin Urine: NEGATIVE
Glucose, UA: NEGATIVE mg/dL
Hgb urine dipstick: NEGATIVE
Ketones, ur: 80 mg/dL — AB
Leukocytes,Ua: NEGATIVE
Nitrite: NEGATIVE
Protein, ur: NEGATIVE mg/dL
Specific Gravity, Urine: 1.017 (ref 1.005–1.030)
pH: 7 (ref 5.0–8.0)

## 2020-08-24 LAB — I-STAT BETA HCG BLOOD, ED (NOT ORDERABLE): I-stat hCG, quantitative: >2000 m[IU]/mL — ABNORMAL HIGH (ref <5)

## 2020-08-24 LAB — LIPASE, BLOOD: Lipase: 24 U/L (ref 11–51)

## 2020-08-24 MED ORDER — PROMETHAZINE HCL 25 MG/ML IJ SOLN
25.0000 mg | Freq: Once | INTRAMUSCULAR | Status: AC
  Administered 2020-08-24: 25 mg via INTRAVENOUS
  Filled 2020-08-24: qty 1

## 2020-08-24 MED ORDER — FAMOTIDINE IN NACL 20-0.9 MG/50ML-% IV SOLN
20.0000 mg | Freq: Once | INTRAVENOUS | Status: AC
  Administered 2020-08-24: 20 mg via INTRAVENOUS
  Filled 2020-08-24: qty 50

## 2020-08-24 MED ORDER — PROMETHAZINE HCL 25 MG PO TABS: 25.0000 mg | ORAL_TABLET | Freq: Four times a day (QID) | ORAL | 0 refills | Status: DC | PRN

## 2020-08-24 MED ORDER — LACTATED RINGERS IV BOLUS
1000.0000 mL | Freq: Once | INTRAVENOUS | Status: AC
  Administered 2020-08-24: 1000 mL via INTRAVENOUS

## 2020-08-24 MED ORDER — KETOROLAC TROMETHAMINE 60 MG/2ML IM SOLN
60.0000 mg | Freq: Once | INTRAMUSCULAR | Status: AC
  Administered 2020-08-24: 60 mg via INTRAMUSCULAR
  Filled 2020-08-24: qty 2

## 2020-08-24 MED ORDER — SODIUM CHLORIDE 0.9 % IV SOLN
8.0000 mg | Freq: Once | INTRAVENOUS | Status: AC
  Administered 2020-08-24: 8 mg via INTRAVENOUS
  Filled 2020-08-24: qty 4

## 2020-08-24 NOTE — ED Triage Notes (Signed)
[facility] with generalized abd pain, emesis today x 20.  Gurgling emesis in triage.

## 2020-08-24 NOTE — ED Triage Notes (Signed)
Emergency Medicine Provider OB Triage Evaluation Note  [name] is a 34 y.o. female, [id number], at [redacted]w[redacted]d gestation who presents to the emergency department with complaints of diffuse abdominal pain with nausea and nonbloody nonbilious emesis that began earlier 4 days ago.  Review of  Systems  Positive: Diarrhea Negative: No fevers or chills.  No coughing or congestion.  No chest pain or shortness of breath.  Physical Exam  BP (!) 144/131 (BP Location: Left Arm)   Pulse 99   Temp 97.9 F (36.6 C) (Axillary)   Resp 20   Ht 5\' 6"  (1.676 m)   Wt 79.4 kg   LMP 04/13/2020 (Approximate)   SpO2 100%   BMI 28.25 kg/m  General: Awake, no distress  HEENT: Atraumatic  Resp: Normal effort  Cardiac: Normal rate Abd: Nondistended, nontender  MSK: Moves all extremities without difficulty Neuro: Speech clear  Medical Decision Making  Pt evaluated for pregnancy concern and is stable for transfer to MAU. Pt is in agreement with plan for transfer.  12:39 AM Discussed with MAU APP, 04/15/2020, who accepts patient in transfer.  Clinical Impression  No diagnosis found.     Victorino Dike, MD 08/24/20 [phone]

## 2020-08-24 NOTE — ED Notes (Signed)
Report called to MAU, pt seen by EDP, transport called

## 2020-08-24 NOTE — MAU Note (Signed)
Patient reports having intense abdominal pain that started this morning followed by N/V an immeasurable number of times.  [name].  Denies ever feeling this pain before.  Denies taking anything to treat her pain.
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
## 2020-08-24 NOTE — Discharge Instructions (Signed)
Hyperemesis Gravidarum Hyperemesis gravidarum is a severe form of nausea and vomiting that happens during pregnancy. Hyperemesis is worse than morning sickness. It may cause you to have nausea or vomiting all day for many days. It may keep you from eating and drinking enough food and liquids, which can lead to dehydration, malnutrition, and weight loss. Hyperemesis usually occurs during the first half (the first 20 weeks) of pregnancy. It often goes away once a woman is in her second half of pregnancy. However, sometimes hyperemesis continues through an entire pregnancy. What are the causes? The cause of this condition is not known. It may be related to changes in chemicals (hormones) in the body during pregnancy, such as the high level of pregnancy hormone (human chorionic gonadotropin) or the increase in the female sex hormone (estrogen). What are the signs or symptoms? Symptoms of this condition include:  Nausea that does not go away.  Vomiting that does not allow you to keep any food down.  Weight loss.  Body fluid loss (dehydration).  Having no desire to eat, or not liking food that you have previously enjoyed. How is this diagnosed? This condition may be diagnosed based on:  A physical exam.  Your medical history.  Your symptoms.  Blood tests.  Urine tests. How is this treated? This condition is managed by controlling symptoms. This may include:  Following an eating plan. This can help lessen nausea and vomiting.  Taking prescription medicines. An eating plan and medicines are often used together to help control symptoms. If medicines do not help relieve nausea and vomiting, you may need to receive fluids through an IV at the hospital. Follow these instructions at home: Eating and drinking   Avoid the following: ? Drinking fluids with meals. Try not to drink anything during the 30 minutes before and after your meals. ? Drinking more than 1 cup of fluid at a  time. ? Eating foods that trigger your symptoms. These may include spicy foods, coffee, high-fat foods, very sweet foods, and acidic foods. ? Skipping meals. Nausea can be more intense on an empty stomach. If you cannot tolerate food, do not force it. Try sucking on ice chips or other frozen items and make up for missed calories later. ? Lying down within 2 hours after eating. ? Being exposed to environmental triggers. These may include food smells, smoky rooms, closed spaces, rooms with strong smells, warm or humid places, overly loud and noisy rooms, and rooms with motion or flickering lights. Try eating meals in a well-ventilated area that is free of strong smells. ? Quick and sudden changes in your movement. ? Taking iron pills and multivitamins that contain iron. If you take prescription iron pills, do not stop taking them unless your health care provider approves. ? Preparing food. The smell of food can spoil your appetite or trigger nausea.  To help relieve your symptoms: ? Listen to your body. Everyone is different and has different preferences. Find what works best for you. ? Eat and drink slowly. ? Eat 5-6 small meals daily instead of 3 large meals. Eating small meals and snacks can help you avoid an empty stomach. ? In the morning, before getting out of bed, eat a couple of crackers to avoid moving around on an empty stomach. ? Try eating starchy foods as these are usually tolerated well. Examples include cereal, toast, bread, potatoes, pasta, rice, and pretzels. ? Include at least 1 serving of protein with your meals and snacks. Protein options include   lean meats, poultry, seafood, beans, nuts, nut butters, eggs, cheese, and yogurt. ? Try eating a protein-rich snack before bed. Examples of a protein-rick snack include cheese and crackers or a peanut butter sandwich made with 1 slice of whole-wheat bread and 1 tsp (5 g) of peanut butter. ? Eat or suck on things that have ginger in them.  It may help relieve nausea. Add  tsp ground ginger to hot tea or choose ginger tea. ? Try drinking 100% fruit juice or an electrolyte drink. An electrolyte drink contains sodium, potassium, and chloride. ? Drink fluids that are cold, clear, and carbonated or sour. Examples include lemonade, ginger ale, lemon-lime soda, ice water, and sparkling water. ? Brush your teeth or use a mouth rinse after meals. ? Talk with your health care provider about starting a supplement of vitamin B6. General instructions  Take over-the-counter and prescription medicines only as told by your health care provider.  Follow instructions from your health care provider about eating or drinking restrictions.  Continue to take your prenatal vitamins as told by your health care provider. If you are having trouble taking your prenatal vitamins, talk with your health care provider about different options.  Keep all follow-up and pre-birth (prenatal) visits as told by your health care provider. This is important. Contact a health care provider if:  You have pain in your abdomen.  You have a severe headache.  You have vision problems.  You are losing weight.  You feel weak or dizzy. Get help right away if:  You cannot drink fluids without vomiting.  You vomit blood.  You have constant nausea and vomiting.  You are very weak.  You faint.  You have a fever and your symptoms suddenly get worse. Summary  Hyperemesis gravidarum is a severe form of nausea and vomiting that happens during pregnancy.  Making some changes to your eating habits may help relieve nausea and vomiting.  This condition may be managed with medicine.  If medicines do not help relieve nausea and vomiting, you may need to receive fluids through an IV at the hospital. This information is not intended to replace advice given to you by your health care provider. Make sure you discuss any questions you have with your health care  provider. Document Revised: 11/07/2017 Document Reviewed: 06/16/2016 [employer]vier Patient Education  2020 [employer]  

## 2020-09-08 ENCOUNTER — Encounter: Payer: Medicaid Other | Admitting: Obstetrics

## 2020-09-23 ENCOUNTER — Encounter: Payer: Self-pay | Admitting: Obstetrics

## 2020-09-23 ENCOUNTER — Ambulatory Visit (INDEPENDENT_AMBULATORY_CARE_PROVIDER_SITE_OTHER): Payer: Medicaid Other | Admitting: Obstetrics

## 2020-09-23 ENCOUNTER — Other Ambulatory Visit (HOSPITAL_COMMUNITY)
Admission: RE | Admit: 2020-09-23 | Discharge: 2020-09-23 | Disposition: A | Discharge status: Home / Self Care | Payer: Medicaid Other | Source: Ambulatory Visit | Attending: Obstetrics | Admitting: Obstetrics

## 2020-09-23 ENCOUNTER — Other Ambulatory Visit: Payer: Self-pay

## 2020-09-23 VITALS — BP 121/77 | HR 87 | Wt 209.0 lb

## 2020-09-23 DIAGNOSIS — J302 Other seasonal allergic rhinitis: Secondary | ICD-10-CM

## 2020-09-23 DIAGNOSIS — O26899 Other specified pregnancy related conditions, unspecified trimester: Secondary | ICD-10-CM

## 2020-09-23 DIAGNOSIS — Z3A19 19 weeks gestation of pregnancy: Secondary | ICD-10-CM | POA: Diagnosis not present

## 2020-09-23 DIAGNOSIS — Z3482 Encounter for supervision of other normal pregnancy, second trimester: Secondary | ICD-10-CM | POA: Diagnosis not present

## 2020-09-23 DIAGNOSIS — O0990 Supervision of high risk pregnancy, unspecified, unspecified trimester: Secondary | ICD-10-CM

## 2020-09-23 DIAGNOSIS — G5603 Carpal tunnel syndrome, bilateral upper limbs: Secondary | ICD-10-CM

## 2020-09-23 DIAGNOSIS — I10 Essential (primary) hypertension: Secondary | ICD-10-CM

## 2020-09-23 DIAGNOSIS — R12 Heartburn: Secondary | ICD-10-CM

## 2020-09-23 LAB — HEPATITIS C ANTIBODY: HCV Ab: NEGATIVE

## 2020-09-23 MED ORDER — PRENATE PIXIE 10-0.6-0.4-200 MG PO CAPS: 1.0000 | ORAL_CAPSULE | Freq: Every day | ORAL | 3 refills | Status: DC

## 2020-09-23 MED ORDER — OMEPRAZOLE 40 MG PO CPDR: 40.0000 mg | DELAYED_RELEASE_CAPSULE | Freq: Every day | ORAL | 6 refills | Status: AC

## 2020-09-23 MED ORDER — FEXOFENADINE HCL 180 MG PO TABS: 180.0000 mg | ORAL_TABLET | Freq: Every day | ORAL | 11 refills | Status: DC

## 2020-09-23 MED ORDER — SPLINT WRIST BRACE/LEFT-RIGHT MISC: 1 refills | Status: DC
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
## 2020-09-23 NOTE — Progress Notes (Signed)
Pt thinks pap smear was done in 2020- normal results  PHQ-9 Results: Score 5- Pt states on several day she feels as if she'd be better off dead  GAD-7: Score 14  Pt complains about hands feeling numb

## 2020-09-24 ENCOUNTER — Other Ambulatory Visit: Payer: Self-pay | Admitting: Obstetrics

## 2020-09-24 DIAGNOSIS — B3731 Acute candidiasis of vulva and vagina: Secondary | ICD-10-CM

## 2020-09-24 DIAGNOSIS — O99119 Other diseases of the blood and blood-forming organs and certain disorders involving the immune mechanism complicating pregnancy, unspecified trimester: Secondary | ICD-10-CM

## 2020-09-24 DIAGNOSIS — D696 Thrombocytopenia, unspecified: Secondary | ICD-10-CM

## 2020-09-24 DIAGNOSIS — B373 Candidiasis of vulva and vagina: Secondary | ICD-10-CM

## 2020-09-24 LAB — OBSTETRIC PANEL, INCLUDING HIV
Antibody Screen: NEGATIVE
Basophils Absolute: 0 10*3/uL (ref 0.0–0.2)
Basos: 0 %
EOS (ABSOLUTE): 0.2 10*3/uL (ref 0.0–0.4)
Eos: 3 %
HIV Screen 4th Generation wRfx: NONREACTIVE
Hematocrit: 31.8 % — ABNORMAL LOW (ref 34.0–46.6)
Hemoglobin: 10.9 g/dL — ABNORMAL LOW (ref 11.1–15.9)
Hepatitis B Surface Ag: NEGATIVE
Immature Grans (Abs): 0 10*3/uL (ref 0.0–0.1)
Immature Granulocytes: 0 %
Lymphocytes Absolute: 1.3 10*3/uL (ref 0.7–3.1)
Lymphs: 24 %
MCH: 29.6 pg (ref 26.6–33.0)
MCHC: 34.3 g/dL (ref 31.5–35.7)
MCV: 86 fL (ref 79–97)
Monocytes Absolute: 0.3 10*3/uL (ref 0.1–0.9)
Monocytes: 6 %
Neutrophils Absolute: 3.4 10*3/uL (ref 1.4–7.0)
Neutrophils: 67 %
Platelets: 137 10*3/uL — ABNORMAL LOW (ref 150–450)
RBC: 3.68 x10E6/uL — ABNORMAL LOW (ref 3.77–5.28)
RDW: 13.4 % (ref 11.7–15.4)
RPR Ser Ql: NONREACTIVE
Rh Factor: POSITIVE
Rubella Antibodies, IGG: 2.53 index (ref >0.99)
WBC: 5.1 10*3/uL (ref 3.4–10.8)

## 2020-09-24 LAB — CERVICOVAGINAL ANCILLARY ONLY
Bacterial Vaginitis (gardnerella): NEGATIVE
Candida Glabrata: POSITIVE — AB
Candida Vaginitis: POSITIVE — AB
Chlamydia: NEGATIVE
Comment: NEGATIVE
Comment: NEGATIVE
Comment: NEGATIVE
Comment: NEGATIVE
Comment: NEGATIVE
Comment: NORMAL
Neisseria Gonorrhea: NEGATIVE
Trichomonas: NEGATIVE

## 2020-09-24 LAB — HEPATITIS C ANTIBODY: Hep C Virus Ab: 0.1 s/co ratio (ref 0.0–0.9)

## 2020-09-24 MED ORDER — TERCONAZOLE 0.4 % VA CREA: 1.0000 | TOPICAL_CREAM | Freq: Every day | VAGINAL | 0 refills | Status: DC

## 2020-09-25 LAB — URINE CULTURE, OB REFLEX

## 2020-09-25 LAB — CULTURE, OB URINE

## 2020-09-29 ENCOUNTER — Encounter: Payer: Medicaid Other | Admitting: Licensed Clinical Social Worker

## 2020-09-29 ENCOUNTER — Telehealth: Payer: Self-pay

## 2020-09-29 NOTE — Telephone Encounter (Signed)
-----   Message from Brock Bad, MD sent at 09/24/2020  3:24 PM EST ----- Terazol 7 Rx for yeast

## 2020-09-29 NOTE — Telephone Encounter (Signed)
Call patient to inform her of test results- no answer or voice mail to leave a message.

## 2020-10-01 ENCOUNTER — Encounter: Payer: Self-pay | Admitting: Obstetrics

## 2020-10-03 ENCOUNTER — Telehealth: Payer: Self-pay

## 2020-10-03 NOTE — Telephone Encounter (Signed)
-----   Message from Charles A Harper, MD sent at 09/24/2020  3:24 PM EST ----- Terazol 7 Rx for yeast 

## 2020-10-03 NOTE — Telephone Encounter (Signed)
Patient advised of most recent lab results.

## 2020-10-06 ENCOUNTER — Other Ambulatory Visit: Payer: Self-pay

## 2020-10-06 DIAGNOSIS — B373 Candidiasis of vulva and vagina: Secondary | ICD-10-CM

## 2020-10-06 DIAGNOSIS — B3731 Acute candidiasis of vulva and vagina: Secondary | ICD-10-CM

## 2020-10-06 MED ORDER — TERCONAZOLE 0.4 % VA CREA: 1.0000 | TOPICAL_CREAM | Freq: Every day | VAGINAL | 0 refills | Status: DC

## 2020-10-07 ENCOUNTER — Encounter: Payer: Self-pay | Admitting: Obstetrics

## 2020-10-08 ENCOUNTER — Other Ambulatory Visit: Payer: Self-pay

## 2020-10-08 ENCOUNTER — Ambulatory Visit: Payer: Medicaid Other | Attending: Obstetrics

## 2020-10-08 ENCOUNTER — Other Ambulatory Visit: Payer: Self-pay | Admitting: Obstetrics

## 2020-10-08 DIAGNOSIS — Z363 Encounter for antenatal screening for malformations: Secondary | ICD-10-CM | POA: Diagnosis not present

## 2020-10-08 DIAGNOSIS — O99322 Drug use complicating pregnancy, second trimester: Secondary | ICD-10-CM | POA: Diagnosis not present

## 2020-10-08 DIAGNOSIS — F1990 Other psychoactive substance use, unspecified, uncomplicated: Secondary | ICD-10-CM

## 2020-10-08 DIAGNOSIS — O0990 Supervision of high risk pregnancy, unspecified, unspecified trimester: Secondary | ICD-10-CM | POA: Diagnosis present

## 2020-10-08 DIAGNOSIS — Z3A21 21 weeks gestation of pregnancy: Secondary | ICD-10-CM | POA: Diagnosis not present

## 2020-10-09 ENCOUNTER — Other Ambulatory Visit: Payer: Self-pay | Admitting: *Deleted

## 2020-10-09 DIAGNOSIS — Z362 Encounter for other antenatal screening follow-up: Secondary | ICD-10-CM

## 2020-10-14 ENCOUNTER — Ambulatory Visit (INDEPENDENT_AMBULATORY_CARE_PROVIDER_SITE_OTHER): Payer: Medicaid Other | Admitting: Licensed Clinical Social Worker

## 2020-10-14 DIAGNOSIS — F1911 Other psychoactive substance abuse, in remission: Secondary | ICD-10-CM | POA: Diagnosis not present

## 2020-10-16 NOTE — BH Specialist Note (Signed)
Integrated Behavioral Health Initial In-Person Visit  MRN: [id number] Name: [name]  Number of Integrated Behavioral Health Clinician visits:: 1/6 Session Start time: 9:05am Session End time: 9:30am Total time: 25 minutes via mychart   Types of Service: Collaborative care  Interpretor:No. Interpretor Name and Language: none   Warm Hand Off Completed.       Subjective: [name] is a 34 y.o. female accompanied by n/a Patient was referred by C. Clearance Coots MD for Substance use disorder Patient reports the following symptoms/concerns: fentanyl use during pregnancy Duration of problem: over two years ; Severity of problem: severe   Objective: Mood: Depressed and Affect: Depressed Risk of harm to self or others: No plan to harm self or others  Life Context: Family and Social: Lives with her father and children  School/Work: n/a Self-Care: n/a Life Changes: new pregnancy  Patient and/or Family's Strengths/Protective Factors: n/a  Goals Addressed: Patient will: 1. Reduce symptoms of: depression 2. Increase knowledge and/or ability of: healthy habits  3. Demonstrate ability to: Increase adequate support systems for patient/family  Progress towards Goals: Ongoing  Interventions: Interventions utilized: Link to [employer]  Standardized Assessments completed: n/a  Patient and/or Family Response: [name] reports motivation to discontinue substance use     Assessment: Patient currently experiencing substance use disorder. [name] reports using fentanyl. According to [name] her most recent use was 10/14/2020. [name] reports she lives with her father and takes care of her sisters children along with her children. [name] could not identify triggers or amount used when asked. [name] reports she is ready to discontinue substance use. Opioid resources were sent to [name] via [employer].    Patient may benefit from collaborative care.    Plan: 1. Follow up with behavioral health clinician on : 2 weeks via my chart  2. Behavioral recommendations: Contact substance abuse program sent via mychart, minimize substance use,   3. Referral(s): Substance Abuse Program 4. "From scale of 1-10, how likely are you to follow plan?":   Yvonne Saxon, LCSW

## 2020-10-21 ENCOUNTER — Encounter: Payer: Medicaid Other | Admitting: Obstetrics and Gynecology

## 2020-10-29 ENCOUNTER — Encounter: Payer: Medicaid Other | Admitting: Obstetrics & Gynecology

## 2020-11-01 NOTE — L&D Delivery Note (Signed)
Operative Delivery Note  Indication for operative vaginal delivery: Prolonged second stage, poor maternal effort  Patient was examined and found to be fully dilated with fetal station of +4.  Patient's bladder was noted to be empty, and there were no known fetal contraindications to operative vaginal delivery. EFW was 7 lbs 10 oz by recent ultrasound.  FHR tracing was reassuring.  Risks of vacuum assistance were discussed in detail, including but not limited to, bleeding, infection, damage to maternal tissues, fetal cephalohematoma, inability to effect vaginal delivery of the head or shoulder dystocia that cannot be resolved by established maneuvers and need for emergency cesarean section.  Patient gave verbal consent.  The soft vacuum cup was positioned over the sagittal suture 3 cm anterior to posterior fontanelle.  Pressure was then increased to 500 mmHg, and the patient was instructed to push.  Pulling was administered along the pelvic curve while patient was pushing; only needed one pull during one contraction.    At 9:05 AM a viable female was delivered via Vaginal, Vacuum Investment banker, operational).  Presentation: vertex; Position: Left,, Occiput,, Posterior; Station: +4.  APGAR:8;9, weight pending. Placenta status: manually expressed, delivered intact, 3VC .    Anesthesia:  Epidural Episiotomy: None Lacerations: 1st degree;Perineal Suture Repair: 4.0 vicryl Est. Blood Loss (mL): 200  Mom to postpartum.  Baby to Couplet care / Skin to Skin.  Jaynie Collins, MD 02/15/2021, 9:40 AM

## 2020-11-05 ENCOUNTER — Ambulatory Visit: Payer: Medicaid Other

## 2020-11-05 ENCOUNTER — Ambulatory Visit: Payer: Medicaid Other | Attending: Obstetrics and Gynecology

## 2020-11-28 ENCOUNTER — Inpatient Hospital Stay (HOSPITAL_COMMUNITY)
Admission: AD | Admit: 2020-11-28 | Discharge: 2020-11-28 | Disposition: A | Discharge status: Home / Self Care | Payer: Medicaid Other | Attending: Obstetrics & Gynecology | Admitting: Obstetrics & Gynecology

## 2020-11-28 ENCOUNTER — Other Ambulatory Visit: Payer: Self-pay

## 2020-11-28 ENCOUNTER — Encounter (HOSPITAL_COMMUNITY): Payer: Self-pay | Admitting: Obstetrics & Gynecology

## 2020-11-28 DIAGNOSIS — Z885 Allergy status to narcotic agent status: Secondary | ICD-10-CM | POA: Insufficient documentation

## 2020-11-28 DIAGNOSIS — Z79899 Other long term (current) drug therapy: Secondary | ICD-10-CM | POA: Diagnosis not present

## 2020-11-28 DIAGNOSIS — Z3A28 28 weeks gestation of pregnancy: Secondary | ICD-10-CM | POA: Diagnosis not present

## 2020-11-28 DIAGNOSIS — K529 Noninfective gastroenteritis and colitis, unspecified: Secondary | ICD-10-CM | POA: Diagnosis not present

## 2020-11-28 DIAGNOSIS — O99613 Diseases of the digestive system complicating pregnancy, third trimester: Secondary | ICD-10-CM | POA: Diagnosis not present

## 2020-11-28 DIAGNOSIS — O218 Other vomiting complicating pregnancy: Secondary | ICD-10-CM | POA: Insufficient documentation

## 2020-11-28 LAB — URINALYSIS, ROUTINE W REFLEX MICROSCOPIC
Bacteria, UA: NONE SEEN
Bilirubin Urine: NEGATIVE
Glucose, UA: NEGATIVE mg/dL
Hgb urine dipstick: NEGATIVE
Ketones, ur: NEGATIVE mg/dL
Leukocytes,Ua: NEGATIVE
Nitrite: NEGATIVE
Protein, ur: 30 mg/dL — AB
Specific Gravity, Urine: 1.027 (ref 1.005–1.030)
pH: 5 (ref 5.0–8.0)

## 2020-11-28 MED ORDER — ONDANSETRON 4 MG PO TBDP
8.0000 mg | ORAL_TABLET | Freq: Once | ORAL | Status: AC
  Administered 2020-11-28: 8 mg via ORAL
  Filled 2020-11-28: qty 2

## 2020-11-28 MED ORDER — DICYCLOMINE HCL 20 MG PO TABS: 20.0000 mg | ORAL_TABLET | Freq: Three times a day (TID) | ORAL | 0 refills | Status: DC | PRN

## 2020-11-28 MED ORDER — PROMETHAZINE HCL 25 MG PO TABS: 25.0000 mg | ORAL_TABLET | Freq: Four times a day (QID) | ORAL | 2 refills | Status: DC | PRN

## 2020-11-28 MED ORDER — ACETAMINOPHEN 500 MG PO TABS
1000.0000 mg | ORAL_TABLET | Freq: Once | ORAL | Status: AC
  Administered 2020-11-28: 1000 mg via ORAL
  Filled 2020-11-28: qty 2

## 2020-11-28 MED ORDER — LOPERAMIDE HCL 2 MG PO CAPS: 2.0000 mg | ORAL_CAPSULE | Freq: Four times a day (QID) | ORAL | 0 refills | Status: DC | PRN

## 2020-11-28 MED ORDER — LOPERAMIDE HCL 2 MG PO CAPS
2.0000 mg | ORAL_CAPSULE | Freq: Once | ORAL | Status: AC
  Administered 2020-11-28: 2 mg via ORAL
  Filled 2020-11-28: qty 1

## 2020-11-28 MED ORDER — ONDANSETRON 4 MG PO TBDP: 4.0000 mg | ORAL_TABLET | Freq: Three times a day (TID) | ORAL | 0 refills | Status: DC | PRN

## 2020-11-28 NOTE — Discharge Instructions (Signed)
Safe Medications in Pregnancy   Acne: Benzoyl Peroxide Salicylic Acid  Backache/Headache: Tylenol: 2 regular strength every 4 hours OR              2 Extra strength every 6 hours  Colds/Coughs/Allergies: Benadryl (alcohol free) 25 mg every 6 hours as needed Breath right strips Claritin Cepacol throat lozenges Chloraseptic throat spray Cold-Eeze- up to three times per day Cough drops, alcohol free Flonase (by prescription only) Guaifenesin Mucinex Robitussin DM (plain only, alcohol free) Saline nasal spray/drops Sudafed (pseudoephedrine) & Actifed ** use only after [redacted] weeks gestation and if you do not have high blood pressure Tylenol Vicks Vaporub Zinc lozenges Zyrtec   Constipation: Colace Ducolax suppositories Fleet enema Glycerin suppositories Metamucil Milk of magnesia Miralax Senokot Smooth move tea  Diarrhea: Kaopectate Imodium A-D  *NO pepto Bismol  Hemorrhoids: Anusol Anusol HC Preparation H Tucks  Indigestion: Tums Maalox Mylanta Zantac  Pepcid  Insomnia: Benadryl (alcohol free) 25mg  every 6 hours as needed Tylenol PM Unisom, no Gelcaps  Leg Cramps: Tums MagGel  Nausea/Vomiting:  Bonine Dramamine Emetrol Ginger extract Sea bands Meclizine  Nausea medication to take during pregnancy:  Unisom (doxylamine succinate 25 mg tablets) Take one tablet daily at bedtime. If symptoms are not adequately controlled, the dose can be increased to a maximum recommended dose of two tablets daily (1/2 tablet in the morning, 1/2 tablet mid-afternoon and one at bedtime). Vitamin B6 100mg  tablets. Take one tablet twice a day (up to 200 mg per day).  Skin Rashes: Aveeno products Benadryl cream or 25mg  every 6 hours as needed Calamine Lotion 1% cortisone cream  Yeast infection: Gyne-lotrimin 7 Monistat 7   **If taking multiple medications, please check labels to avoid duplicating the same active ingredients **take medication as directed on  the label ** Do not exceed 4000 mg of tylenol in 24 hours **Do not take medications that contain aspirin or ibuprofen     Food Choices to Help Relieve Diarrhea, Adult Diarrhea can make you feel weak and cause you to become dehydrated. It is important to choose the right foods and drinks to:  Relieve diarrhea.  Replace lost fluids and nutrients.  Prevent dehydration. What are tips for following this plan? Relieving diarrhea  Avoid foods that make your diarrhea worse. These may include: ? Foods and beverages sweetened with high-fructose corn syrup, honey, or sweeteners such as xylitol, sorbitol, and mannitol. ? Fried, greasy, or spicy foods. ? Raw fruits and vegetables.  Eat foods that are rich in probiotics. These include foods such as yogurt and fermented milk products. Probiotics can help increase healthy bacteria in your stomach and intestines (gastrointestinal tract or GI tract). This may help digestion and stop diarrhea.  If you have lactose intolerance, avoid dairy products. These may make your diarrhea worse.  Take medicine to help stop diarrhea only as told by your health care provider. Replacing nutrients  Eat bland, easy-to-digest foods in small amounts as you are able, until your diarrhea starts to get better. These foods include bananas, applesauce, rice, toast, and crackers.  Gradually reintroduce nutrient-rich foods as tolerated or as told by your health care provider. This includes: ? Well-cooked protein foods, such as eggs, lean meats like fish or chicken without skin, and tofu. ? Peeled, seeded, and soft-cooked fruits and vegetables. ? Low-fat dairy products. ? Whole grains.  Take vitamin and mineral supplements as told by your health care provider.   Preventing dehydration  Start by sipping water or a solution to prevent  dehydration (oral rehydration solution, ORS). This is a drink that helps replace fluids and minerals your body has lost. You can buy an ORS  at pharmacies and retail stores.  Try to drink at least 8-10 cups (2,000-2,500 mL) of fluid each day to help replace lost fluids. If you have urine that is pale yellow, you are getting enough fluids.  You may drink other liquids in addition to water, such as fruit juice that you have added water to (diluted fruit juice) or low-calorie sports drinks, as tolerated or as told by your health care provider.  Avoid drinks with caffeine, such as coffee, tea, or soft drinks.  Avoid alcohol.   Summary  When you have diarrhea, it is important to choose the right foods and drinks to relieve diarrhea, to replace lost fluids and nutrients, and to prevent dehydration.  Make sure you drink enough fluid to keep your urine pale yellow.  You may benefit from eating bland foods at first. Gradually reintroduce healthy, nutrient-rich foods as tolerated or as told by your health care provider.  Avoid foods that make your diarrhea worse, such as fried, greasy, or spicy foods. This information is not intended to replace advice given to you by your health care provider. Make sure you discuss any questions you have with your health care provider. Document Revised: 12/04/2019 Document Reviewed: 12/04/2019 Elsevier Patient Education  2021 [employer].

## 2020-11-28 NOTE — MAU Note (Signed)
Presents with c/o stomach ache since last night.  Reports has had N/V/D since stomach ache.  Denies VB or LOF.  Endorses FM, but as much as usual.
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
## 2020-12-01 ENCOUNTER — Encounter: Payer: Medicaid Other | Admitting: Obstetrics and Gynecology

## 2020-12-01 ENCOUNTER — Telehealth (INDEPENDENT_AMBULATORY_CARE_PROVIDER_SITE_OTHER): Payer: Medicaid Other | Admitting: Advanced Practice Midwife

## 2020-12-01 DIAGNOSIS — Z3A28 28 weeks gestation of pregnancy: Secondary | ICD-10-CM

## 2020-12-01 DIAGNOSIS — F1110 Opioid abuse, uncomplicated: Secondary | ICD-10-CM

## 2020-12-01 DIAGNOSIS — O99323 Drug use complicating pregnancy, third trimester: Secondary | ICD-10-CM

## 2020-12-01 DIAGNOSIS — O0990 Supervision of high risk pregnancy, unspecified, unspecified trimester: Secondary | ICD-10-CM

## 2020-12-01 NOTE — Progress Notes (Signed)
TELEHEALTH OBSTETRICS VISIT ENCOUNTER NOTE  Provider location: [facility] [place]   I connected with Antony Contras on 12/01/20 at  4:00 PM EST by telephone at home and verified that I am speaking with the correct person using two identifiers. Of note, unable to do video encounter due to technical difficulties.    I discussed the limitations, risks, security and privacy concerns of performing an evaluation and management service by telephone and the availability of in person appointments. I also discussed with the patient that there may be a patient responsible charge related to this service. The patient expressed understanding and agreed to proceed.  Subjective:  [name] is a 35 y.o. [id number] at [redacted]w[redacted]d being followed for ongoing prenatal care.  She is currently monitored for the following issues for this high-risk pregnancy and has Lump or mass in breast; Splenomegaly; Generalized anxiety disorder; Hemorrhage of rectum and anus; Acute perforated appendicitis; Gastritis; Clostridium difficile enterocolitis; Nausea and vomiting; Atypical chest pain; Benign essential hypertension; Herpes simplex type 1 infection; Irritable bowel syndrome; Obesity; Intractable vomiting; Trichomonas vaginitis; History of noncompliance with medical treatment; AKI (acute kidney injury) (HCC); Chronic abdominal pain; History of substance abuse (HCC); and Intravenous drug use during pregnancy, antepartum on their problem list.  Patient reports episode of abdominal pain 2 days ago that resolved, no pain today and is still using opiates, Fentanyl specifically, daily.. Reports fetal movement. Denies any contractions, bleeding or leaking of fluid.   The following portions of the patient's history were reviewed and updated as appropriate: allergies, current medications, past family history, past medical history, past social history, past surgical history and problem list.   Objective:  Last  menstrual period 04/13/2020. General:  Alert, oriented and cooperative.   Mental Status: Normal mood and affect perceived. Normal judgment and thought content.  Rest of physical exam deferred due to type of encounter  Assessment and Plan:  Pregnancy: [id number] at [redacted]w[redacted]d 1. [redacted] weeks gestation of pregnancy   2. Supervision of high risk pregnancy, antepartum --Pt reports good fetal movement, denies cramping, LOF, or vaginal bleeding --Anticipatory guidance about next visits/weeks of pregnancy given. --Pt needs GTT, did not have transportation today but can get in tomorrow morning.  GTT rescheduled for tomorrow am. Pt to arrive at 8:15 or 8:30.   3. Substance abuse affecting pregnancy in third trimester, antepartum --Discussed recent report of opiate abuse and visit with integrated behavioral health counselor on 10/13/20.   --Pt agreed she is still using and desires help --She has tried Suboxone in the past but did not tolerate the taste of the dissolvable strips and would try again if she could get the pills --Consult Dr Adrian Blackwater, who recommended a visit with Suboxone prescriber as soon as possible, no one at [facility] office this week so message sent to set up virtual visit with Dr Crissie Reese on 2/1 or 2/2. --Reviewed reasons to seek care in medical emergency or in crisis and pt stated understanding. --Message sent to set up follow up with integrated behavioral health as well  Preterm labor symptoms and general obstetric precautions including but not limited to vaginal bleeding, contractions, leaking of fluid and fetal movement were reviewed in detail with the patient.  I discussed the assessment and treatment plan with the patient. The patient was provided an opportunity to ask questions and all were answered. The patient agreed with the plan and demonstrated an understanding of the instructions. The patient was advised to call back or seek  an in-person office evaluation/go to MAU at [facility] for any urgent or concerning symptoms. Please refer to After Visit Summary for other counseling recommendations.   I provided 15 minutes of non-face-to-face time during this encounter.  No follow-ups on file.  No future appointments.  Sharen Counter, CNM [facility], [facility]

## 2020-12-02 ENCOUNTER — Ambulatory Visit (INDEPENDENT_AMBULATORY_CARE_PROVIDER_SITE_OTHER): Payer: Medicaid Other | Admitting: Family Medicine

## 2020-12-02 ENCOUNTER — Ambulatory Visit (INDEPENDENT_AMBULATORY_CARE_PROVIDER_SITE_OTHER): Payer: Medicaid Other | Admitting: Licensed Clinical Social Worker

## 2020-12-02 ENCOUNTER — Telehealth: Payer: Self-pay

## 2020-12-02 ENCOUNTER — Other Ambulatory Visit: Payer: Self-pay

## 2020-12-02 ENCOUNTER — Other Ambulatory Visit: Payer: Medicaid Other

## 2020-12-02 VITALS — Wt 216.1 lb

## 2020-12-02 DIAGNOSIS — F1120 Opioid dependence, uncomplicated: Secondary | ICD-10-CM | POA: Insufficient documentation

## 2020-12-02 DIAGNOSIS — F1990 Other psychoactive substance use, unspecified, uncomplicated: Secondary | ICD-10-CM

## 2020-12-02 DIAGNOSIS — O0990 Supervision of high risk pregnancy, unspecified, unspecified trimester: Secondary | ICD-10-CM

## 2020-12-02 DIAGNOSIS — F1911 Other psychoactive substance abuse, in remission: Secondary | ICD-10-CM

## 2020-12-02 DIAGNOSIS — Z3A29 29 weeks gestation of pregnancy: Secondary | ICD-10-CM

## 2020-12-02 MED ORDER — BUPRENORPHINE HCL-NALOXONE HCL 2-0.5 MG SL SUBL: 1.0000 | SUBLINGUAL_TABLET | Freq: Every day | SUBLINGUAL | 0 refills | Status: DC

## 2020-12-02 MED ORDER — NALOXONE HCL 4 MG/0.1ML NA LIQD: 1.0000 | Freq: Once | NASAL | 1 refills | Status: AC

## 2020-12-02 NOTE — Progress Notes (Signed)
Subjective:  [name] is a 35 y.o. [id number] at [redacted]w[redacted]d being seen today for ongoing prenatal care.  She is currently monitored for the following issues for this high-risk pregnancy and has Lump or mass in breast; Splenomegaly; Generalized anxiety disorder; Hemorrhage of rectum and anus; Acute perforated appendicitis; Gastritis; Clostridium difficile enterocolitis; Nausea and vomiting; Atypical chest pain; Benign essential hypertension; Herpes simplex type 1 infection; Irritable bowel syndrome; Obesity; Intractable vomiting; Trichomonas vaginitis; History of noncompliance with medical treatment; AKI (acute kidney injury) (HCC); Chronic abdominal pain; History of substance abuse (HCC); Intravenous drug use during pregnancy, antepartum; and Opioid use disorder, severe, on maintenance therapy (HCC) on their problem list.  Patient reports no complaints.  Contractions: Not present. Vag. Bleeding: None.  Movement: Present. Denies leaking of fluid.   The following portions of the patient's history were reviewed and updated as appropriate: allergies, current medications, past family history, past medical history, past social history, past surgical history and problem list. Problem list updated.  Objective:   Vitals:   12/02/20 1538  Weight: 216 lb 1.6 oz (98 kg)    Fetal Status: Fetal Heart Rate (bpm): 137   Movement: Present     General:  Alert, oriented and cooperative. Patient is in no acute distress.  Skin: Skin is warm and dry. No rash noted.   Cardiovascular: Normal heart rate noted  Respiratory: Normal respiratory effort, no problems with respiration noted  Abdomen: Soft, gravid, appropriate for gestational age. Pain/Pressure: Absent     Pelvic: Vag. Bleeding: None     Cervical exam deferred        Extremities: Normal range of motion.  Edema: None  Mental Status: Normal mood and affect. Normal behavior. Normal judgment and thought content.   Urinalysis:      Assessment and Plan:   Pregnancy: [id number] at [redacted]w[redacted]d  1. History of substance abuse (HCC) - BUPRENORPHINE + Drug Screen, Ur - naloxone (NARCAN) nasal spray 4 mg/0.1 mL; Place 1 spray into the nose once for 1 dose.  Dispense: 1 g; Refill: 1  2. Opioid use disorder, severe, on maintenance therapy [facility]) Patient normally goes to [facility], reported daily fentanyl use to provider there yesterday so given urgent work in appt at [facility] today No other prenatal care issues addressed at this visit She reports daily use of ~1/2 g of fentanyl daily which she uses intranasally Last use was this morning She does not use with other people. She denies any other substances besides MJ. 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 She lives with her father and son, neither of them use substances, she reports they are "done with her using" She feels ready to stop using fentanyl, would like to start on suboxone. She has tried the films in the past but hates the taste Rx sent for 28 tabs of suboxone SL tablets (8mg  daily x7d), discussed with patient that although I sent a note to the pharmacy saying she had failed a trial of films this is non-preferred and she may have issues picking this up from the pharmacy  Discussed home induction process in detail: needs to wait at least 12h from her last use, needs to be having significant withdrawal symptoms before she takes her first dose of 2mg  or she will precipitate significant withdrawal symptoms. She should wait an hour and if she still feels unwell she should take another 2mg . She can continue to do this up to a total dose of 8mg  for  one twenty-four hour period. Subsequently she should take the same dose daily. If she is taking 8mg  daily and still having withdrawal symptoms she is to contact me ASAP for a re-evaluation and possible dose increase.   She was also sent a script for Narcan.  Patient scheduled for follow up w Dr. on 12/08/2020 at 1:15 PM, I will send a  message to her detailing the management plan  Preterm labor symptoms and general obstetric precautions including but not limited to vaginal bleeding, contractions, leaking of fluid and fetal movement were reviewed in detail with the patient. Please refer to After Visit Summary for other counseling recommendations.  No follow-ups on file.   02/05/2021, MD

## 2020-12-02 NOTE — Telephone Encounter (Signed)
Left message with pt on mobile and message with Father, August Saucer to return call to our office for OV with Dr Crissie Reese today if possible.

## 2020-12-03 ENCOUNTER — Telehealth: Payer: Self-pay | Admitting: Family Medicine

## 2020-12-03 ENCOUNTER — Telehealth: Payer: Self-pay

## 2020-12-03 ENCOUNTER — Encounter: Payer: Self-pay | Admitting: Advanced Practice Midwife

## 2020-12-03 ENCOUNTER — Other Ambulatory Visit: Payer: Self-pay | Admitting: Advanced Practice Midwife

## 2020-12-03 ENCOUNTER — Other Ambulatory Visit: Payer: Self-pay

## 2020-12-03 DIAGNOSIS — O0990 Supervision of high risk pregnancy, unspecified, unspecified trimester: Secondary | ICD-10-CM | POA: Insufficient documentation

## 2020-12-03 DIAGNOSIS — O99013 Anemia complicating pregnancy, third trimester: Secondary | ICD-10-CM | POA: Insufficient documentation

## 2020-12-03 LAB — CBC
Hematocrit: 31.2 % — ABNORMAL LOW (ref 34.0–46.6)
Hemoglobin: 9.9 g/dL — ABNORMAL LOW (ref 11.1–15.9)
MCH: 29 pg (ref 26.6–33.0)
MCHC: 31.7 g/dL (ref 31.5–35.7)
MCV: 92 fL (ref 79–97)
Platelets: 159 10*3/uL (ref 150–450)
RBC: 3.41 x10E6/uL — ABNORMAL LOW (ref 3.77–5.28)
RDW: 13.2 % (ref 11.7–15.4)
WBC: 6.7 10*3/uL (ref 3.4–10.8)

## 2020-12-03 LAB — GLUCOSE TOLERANCE, 2 HOURS W/ 1HR
Glucose, 1 hour: 77 mg/dL (ref 65–179)
Glucose, 2 hour: 75 mg/dL (ref 65–152)
Glucose, Fasting: 65 mg/dL (ref 65–91)

## 2020-12-03 LAB — HIV ANTIBODY (ROUTINE TESTING W REFLEX): HIV Screen 4th Generation wRfx: NONREACTIVE

## 2020-12-03 LAB — RPR: RPR Ser Ql: NONREACTIVE

## 2020-12-03 MED ORDER — DOCUSATE SODIUM 100 MG PO CAPS
100.0000 mg | ORAL_CAPSULE | Freq: Two times a day (BID) | ORAL | 2 refills | Status: DC | PRN
Start: 2020-12-03 — End: 2021-02-17

## 2020-12-03 MED ORDER — BLOOD PRESSURE MONITOR KIT: 1.0000 | PACK | 0 refills | Status: AC

## 2020-12-03 MED ORDER — FERROUS SULFATE 325 (65 FE) MG PO TABS
325.0000 mg | ORAL_TABLET | ORAL | 5 refills | Status: DC
Start: 2020-12-03 — End: 2021-02-17

## 2020-12-03 NOTE — Telephone Encounter (Signed)
Pt called regarding swelling in feet and ankles. denies any HA, no visual changes or dizziness. Pt not able to check B/P does not have cuff Rx sent for cuff Appt scheduled for B/P check tomorrow due to no appt late in day. Not currently on Rx for B/P , drinking one bottle of water a day Pt advised to try increasing water intake, limit salt intake and elevate feet/rest. Pt advised to check B/P once she picked up cuff and contact office today and let us know reading pt made aware can contact after hours. Pt advised on HTN  precautions and when to report to MAU in no improvement in sx's. Pt agreeable and voiced understanding.

## 2020-12-03 NOTE — Telephone Encounter (Signed)
Called patient to see how she was feeling and see if she'd had any problems picking up her suboxone prescription.  Reports she had issues with Medicaid and needing to switch her designated provider to myself, was only able to just pickup her prescription a little while ago. Was feeling sick earlier in the day and used fentanyl around 1300. Told her I understood she did not want to feel sick and thanked her for her honesty. Reiterated importance of waiting to take first dose of suboxone until she has significant withdrawal symptoms, minimum of 12 hours, so that she does not precipitate a sudden withdrawal. Patient understood instructions and able to articulate maximum daily dose of 8mg  when prompted.  Also told patient by chance I am covering an office session at [facility] this coming Friday, so if home induction is not going well we should be able to add her on to be seen.  [facility] admin/clinical staff: if patient calls and needs to be seen on Friday I am ok with her being added on to the end of my schedule.

## 2020-12-03 NOTE — BH Specialist Note (Signed)
Integrated Behavioral Health Follow Up In-Person Visit  MRN: [id number] Name: [name]  Number of Integrated Behavioral Health Clinician visits: 2/6 Session Start time: 9:38am  Session End time: 10:20am Total time: 42 minutes in person at [facility]   Types of Service: General Behavioral Integrated Care (BHI)  Interpretor:No. Interpretor Name and Language: none  Subjective: [name] is a 35 y.o. female accompanied by n/a Patient was referred by C. Clearance Coots for active substance use. Patient reports the following symptoms/concerns: Fentanyl use during pregnancy Duration of problem: several years ; Severity of problem: severe  Objective: Mood: Depressed and Affect: Appropriate Risk of harm to self or others: Self-harm behaviors  Life Context: Family and Social: Lives in [place] with father of baby and patient's father  School/Work: none  Self-Care: none Life Changes: New pregnancy   Patient and/or Family's Strengths/Protective Factors: Reports father of baby is supportive and does not use illegal substance   Goals Addressed: Patient will: 1.  Reduce symptoms of: Frequent fentanyl use during pregnancy  2.  Increase knowledge and/or ability of: healthy habits  3.  Demonstrate ability to: Decrease self-medicating behaviors  Progress towards Goals: Ongoing  Interventions: Interventions utilized:  Motivational Interviewing Standardized Assessments completed: Not Needed   Assessment: Patient currently experiencing fentanyl use during pregnancy   Patient may benefit from referral to Dr. Margo Aye  Plan: 1. Follow up with behavioral health clinician on : 2 weeks in person or virtual  2. Behavioral recommendations: Highly recommend cease or decrease fentanyl use, contact substance use resources sent via mychart on 10/16/2020, take prenatal vitamins  3. Referral(s): Dr. Crissie Reese 4. "From scale of 1-10, how likely are you to follow plan?":   [name],  LCSW

## 2020-12-04 ENCOUNTER — Ambulatory Visit: Payer: Medicaid Other

## 2020-12-08 ENCOUNTER — Encounter: Payer: Medicaid Other | Admitting: Obstetrics and Gynecology

## 2020-12-12 LAB — BUPRENORPHINE + DRUG SCREEN, UR
6-Acetylmorphine: NEGATIVE ng/mL
Amphetamines: NEGATIVE ng/mL
Barbiturates: NEGATIVE ng/mL
Benzodiazepines: NEGATIVE ng/mL
Buprenorphine: NEGATIVE ng/mL
Carisoprodol: NEGATIVE ng/mL
Cocaine Metabolite: NEGATIVE ng/mL
Creatinine: 315.4 mg/dL (ref >=20)
Ethyl Glucuronide: NEGATIVE ng/mL
Fentanyl: NEGATIVE ng/mL
Gabapentin: NEGATIVE ug/mL
Methadone: NEGATIVE ng/mL
Nitrites: NEGATIVE ug/mL (ref <200)
Opiates: NEGATIVE ng/mL
Oxycodone: NEGATIVE ng/mL
Phencyclidine (PCP): NEGATIVE ng/mL
Propoxyphene: NEGATIVE ng/mL
Tapentadol: NEGATIVE ng/mL
Tramadol: NEGATIVE ng/mL
Urine pH: 5.5 (ref 4.5–8.9)

## 2020-12-17 ENCOUNTER — Encounter: Payer: Medicaid Other | Admitting: Obstetrics and Gynecology

## 2020-12-23 ENCOUNTER — Encounter: Payer: Medicaid Other | Admitting: Obstetrics & Gynecology

## 2020-12-26 ENCOUNTER — Telehealth (INDEPENDENT_AMBULATORY_CARE_PROVIDER_SITE_OTHER): Payer: Medicaid Other | Admitting: Family Medicine

## 2020-12-26 VITALS — Wt 208.0 lb

## 2020-12-26 DIAGNOSIS — Z5320 Procedure and treatment not carried out because of patient's decision for unspecified reasons: Secondary | ICD-10-CM

## 2020-12-26 NOTE — Progress Notes (Signed)
Patient triaged but then did not pick up when called or log on to video room with text invitation.

## 2020-12-26 NOTE — Progress Notes (Signed)
+   Fetal movement. Pt c/o left leg pain and swelling.

## 2020-12-26 NOTE — Patient Instructions (Signed)
 Contraception Choices Contraception, also called birth control, refers to methods or devices that prevent pregnancy. Hormonal methods Contraceptive implant A contraceptive implant is a thin, plastic tube that contains a hormone that prevents pregnancy. It is different from an intrauterine device (IUD). It is inserted into the upper part of the arm by a health care provider. Implants can be effective for up to 3 years. Progestin-only injections Progestin-only injections are injections of progestin, a synthetic form of the hormone progesterone. They are given every 3 months by a health care provider. Birth control pills Birth control pills are pills that contain hormones that prevent pregnancy. They must be taken once a day, preferably at the same time each day. A prescription is needed to use this method of contraception. Birth control patch The birth control patch contains hormones that prevent pregnancy. It is placed on the skin and must be changed once a week for three weeks and removed on the fourth week. A prescription is needed to use this method of contraception. Vaginal ring A vaginal ring contains hormones that prevent pregnancy. It is placed in the vagina for three weeks and removed on the fourth week. After that, the process is repeated with a new ring. A prescription is needed to use this method of contraception. Emergency contraceptive Emergency contraceptives prevent pregnancy after unprotected sex. They come in pill form and can be taken up to 5 days after sex. They work best the sooner they are taken after having sex. Most emergency contraceptives are available without a prescription. This method should not be used as your only form of birth control.   Barrier methods Female condom A female condom is a thin sheath that is worn over the penis during sex. Condoms keep sperm from going inside a woman's body. They can be used with a sperm-killing substance (spermicide) to increase their  effectiveness. They should be thrown away after one use. Female condom A female condom is a soft, loose-fitting sheath that is put into the vagina before sex. The condom keeps sperm from going inside a woman's body. They should be thrown away after one use. Diaphragm A diaphragm is a soft, dome-shaped barrier. It is inserted into the vagina before sex, along with a spermicide. The diaphragm blocks sperm from entering the uterus, and the spermicide kills sperm. A diaphragm should be left in the vagina for 6-8 hours after sex and removed within 24 hours. A diaphragm is prescribed and fitted by a health care provider. A diaphragm should be replaced every 1-2 years, after giving birth, after gaining more than 15 lb (6.8 kg), and after pelvic surgery. Cervical cap A cervical cap is a round, soft latex or plastic cup that fits over the cervix. It is inserted into the vagina before sex, along with spermicide. It blocks sperm from entering the uterus. The cap should be left in place for 6-8 hours after sex and removed within 48 hours. A cervical cap must be prescribed and fitted by a health care provider. It should be replaced every 2 years. Sponge A sponge is a soft, circular piece of polyurethane foam with spermicide in it. The sponge helps block sperm from entering the uterus, and the spermicide kills sperm. To use it, you make it wet and then insert it into the vagina. It should be inserted before sex, left in for at least 6 hours after sex, and removed and thrown away within 30 hours. Spermicides Spermicides are chemicals that kill or block sperm from entering the   cervix and uterus. They can come as a cream, jelly, suppository, foam, or tablet. A spermicide should be inserted into the vagina with an applicator at least 10-15 minutes before sex to allow time for it to work. The process must be repeated every time you have sex. Spermicides do not require a prescription.   Intrauterine  contraception Intrauterine device (IUD) An IUD is a T-shaped device that is put in a woman's uterus. There are two types:  Hormone IUD.This type contains progestin, a synthetic form of the hormone progesterone. This type can stay in place for 3-5 years.  Copper IUD.This type is wrapped in copper wire. It can stay in place for 10 years. Permanent methods of contraception Female tubal ligation In this method, a woman's fallopian tubes are sealed, tied, or blocked during surgery to prevent eggs from traveling to the uterus. Hysteroscopic sterilization In this method, a small, flexible insert is placed into each fallopian tube. The inserts cause scar tissue to form in the fallopian tubes and block them, so sperm cannot reach an egg. The procedure takes about 3 months to be effective. Another form of birth control must be used during those 3 months. Female sterilization This is a procedure to tie off the tubes that carry sperm (vasectomy). After the procedure, the man can still ejaculate fluid (semen). Another form of birth control must be used for 3 months after the procedure. Natural planning methods Natural family planning In this method, a couple does not have sex on days when the woman could become pregnant. Calendar method In this method, the woman keeps track of the length of each menstrual cycle, identifies the days when pregnancy can happen, and does not have sex on those days. Ovulation method In this method, a couple avoids sex during ovulation. Symptothermal method This method involves not having sex during ovulation. The woman typically checks for ovulation by watching changes in her temperature and in the consistency of cervical mucus. Post-ovulation method In this method, a couple waits to have sex until after ovulation. Where to find more information  Centers for Disease Control and Prevention: www.cdc.gov Summary  Contraception, also called birth control, refers to methods or  devices that prevent pregnancy.  Hormonal methods of contraception include implants, injections, pills, patches, vaginal rings, and emergency contraceptives.  Barrier methods of contraception can include female condoms, female condoms, diaphragms, cervical caps, sponges, and spermicides.  There are two types of IUDs (intrauterine devices). An IUD can be put in a woman's uterus to prevent pregnancy for 3-5 years.  Permanent sterilization can be done through a procedure for males and females. Natural family planning methods involve nothaving sex on days when the woman could become pregnant. This information is not intended to replace advice given to you by your health care provider. Make sure you discuss any questions you have with your health care provider. Document Revised: 03/24/2020 Document Reviewed: 03/24/2020 Elsevier Patient Education  2021 [employer] Inc.   Breastfeeding  Choosing to breastfeed is one of the best decisions you can make for yourself and your baby. A change in hormones during pregnancy causes your breasts to make breast milk in your milk-producing glands. Hormones prevent breast milk from being released before your baby is born. They also prompt milk flow after birth. Once breastfeeding has begun, thoughts of your baby, as well as his or her sucking or crying, can stimulate the release of milk from your milk-producing glands. Benefits of breastfeeding Research shows that breastfeeding offers many health benefits   for infants and mothers. It also offers a cost-free and convenient way to feed your baby. For your baby  Your first milk (colostrum) helps your baby's digestive system to function better.  Special cells in your milk (antibodies) help your baby to fight off infections.  Breastfed babies are less likely to develop asthma, allergies, obesity, or type 2 diabetes. They are also at lower risk for sudden infant death syndrome (SIDS).  Nutrients in breast milk are better  able to meet your baby's needs compared to infant formula.  Breast milk improves your baby's brain development. For you  Breastfeeding helps to create a very special bond between you and your baby.  Breastfeeding is convenient. Breast milk costs nothing and is always available at the correct temperature.  Breastfeeding helps to burn calories. It helps you to lose the weight that you gained during pregnancy.  Breastfeeding makes your uterus return faster to its size before pregnancy. It also slows bleeding (lochia) after you give birth.  Breastfeeding helps to lower your risk of developing type 2 diabetes, osteoporosis, rheumatoid arthritis, cardiovascular disease, and breast, ovarian, uterine, and endometrial cancer later in life. Breastfeeding basics Starting breastfeeding  Find a comfortable place to sit or lie down, with your neck and back well-supported.  Place a pillow or a rolled-up blanket under your baby to bring him or her to the level of your breast (if you are seated). Nursing pillows are specially designed to help support your arms and your baby while you breastfeed.  Make sure that your baby's tummy (abdomen) is facing your abdomen.  Gently massage your breast. With your fingertips, massage from the outer edges of your breast inward toward the nipple. This encourages milk flow. If your milk flows slowly, you may need to continue this action during the feeding.  Support your breast with 4 fingers underneath and your thumb above your nipple (make the letter "C" with your hand). Make sure your fingers are well away from your nipple and your baby's mouth.  Stroke your baby's lips gently with your finger or nipple.  When your baby's mouth is open wide enough, quickly bring your baby to your breast, placing your entire nipple and as much of the areola as possible into your baby's mouth. The areola is the colored area around your nipple. ? More areola should be visible above your  baby's upper lip than below the lower lip. ? Your baby's lips should be opened and extended outward (flanged) to ensure an adequate, comfortable latch. ? Your baby's tongue should be between his or her lower gum and your breast.  Make sure that your baby's mouth is correctly positioned around your nipple (latched). Your baby's lips should create a seal on your breast and be turned out (everted).  It is common for your baby to suck about 2-3 minutes in order to start the flow of breast milk. Latching Teaching your baby how to latch onto your breast properly is very important. An improper latch can cause nipple pain, decreased milk supply, and poor weight gain in your baby. Also, if your baby is not latched onto your nipple properly, he or she may swallow some air during feeding. This can make your baby fussy. Burping your baby when you switch breasts during the feeding can help to get rid of the air. However, teaching your baby to latch on properly is still the best way to prevent fussiness from swallowing air while breastfeeding. Signs that your baby has successfully latched onto   your nipple  Silent tugging or silent sucking, without causing you pain. Infant's lips should be extended outward (flanged).  Swallowing heard between every 3-4 sucks once your milk has started to flow (after your let-down milk reflex occurs).  Muscle movement above and in front of his or her ears while sucking. Signs that your baby has not successfully latched onto your nipple  Sucking sounds or smacking sounds from your baby while breastfeeding.  Nipple pain. If you think your baby has not latched on correctly, slip your finger into the corner of your baby's mouth to break the suction and place it between your baby's gums. Attempt to start breastfeeding again. Signs of successful breastfeeding Signs from your baby  Your baby will gradually decrease the number of sucks or will completely stop sucking.  Your baby  will fall asleep.  Your baby's body will relax.  Your baby will retain a small amount of milk in his or her mouth.  Your baby will let go of your breast by himself or herself. Signs from you  Breasts that have increased in firmness, weight, and size 1-3 hours after feeding.  Breasts that are softer immediately after breastfeeding.  Increased milk volume, as well as a change in milk consistency and color by the fifth day of breastfeeding.  Nipples that are not sore, cracked, or bleeding. Signs that your baby is getting enough milk  Wetting at least 1-2 diapers during the first 24 hours after birth.  Wetting at least 5-6 diapers every 24 hours for the first week after birth. The urine should be clear or pale yellow by the age of 5 days.  Wetting 6-8 diapers every 24 hours as your baby continues to grow and develop.  At least 3 stools in a 24-hour period by the age of 5 days. The stool should be soft and yellow.  At least 3 stools in a 24-hour period by the age of 7 days. The stool should be seedy and yellow.  No loss of weight greater than 10% of birth weight during the first 3 days of life.  Average weight gain of 4-7 oz (113-198 g) per week after the age of 4 days.  Consistent daily weight gain by the age of 5 days, without weight loss after the age of 2 weeks. After a feeding, your baby may spit up a small amount of milk. This is normal. Breastfeeding frequency and duration Frequent feeding will help you make more milk and can prevent sore nipples and extremely full breasts (breast engorgement). Breastfeed when you feel the need to reduce the fullness of your breasts or when your baby shows signs of hunger. This is called "breastfeeding on demand." Signs that your baby is hungry include:  Increased alertness, activity, or restlessness.  Movement of the head from side to side.  Opening of the mouth when the corner of the mouth or cheek is stroked (rooting).  Increased  sucking sounds, smacking lips, cooing, sighing, or squeaking.  Hand-to-mouth movements and sucking on fingers or hands.  Fussing or crying. Avoid introducing a pacifier to your baby in the first 4-6 weeks after your baby is born. After this time, you may choose to use a pacifier. Research has shown that pacifier use during the first year of a baby's life decreases the risk of sudden infant death syndrome (SIDS). Allow your baby to feed on each breast as long as he or she wants. When your baby unlatches or falls asleep while feeding from the   first breast, offer the second breast. Because newborns are often sleepy in the first few weeks of life, you may need to awaken your baby to get him or her to feed. Breastfeeding times will vary from baby to baby. However, the following rules can serve as a guide to help you make sure that your baby is properly fed:  Newborns (babies 4 weeks of age or younger) may breastfeed every 1-3 hours.  Newborns should not go without breastfeeding for longer than 3 hours during the day or 5 hours during the night.  You should breastfeed your baby a minimum of 8 times in a 24-hour period. Breast milk pumping Pumping and storing breast milk allows you to make sure that your baby is exclusively fed your breast milk, even at times when you are unable to breastfeed. This is especially important if you go back to work while you are still breastfeeding, or if you are not able to be present during feedings. Your lactation consultant can help you find a method of pumping that works best for you and give you guidelines about how long it is safe to store breast milk.      Caring for your breasts while you breastfeed Nipples can become dry, cracked, and sore while breastfeeding. The following recommendations can help keep your breasts moisturized and healthy:  Avoid using soap on your nipples.  Wear a supportive bra designed especially for nursing. Avoid wearing underwire-style  bras or extremely tight bras (sports bras).  Air-dry your nipples for 3-4 minutes after each feeding.  Use only cotton bra pads to absorb leaked breast milk. Leaking of breast milk between feedings is normal.  Use lanolin on your nipples after breastfeeding. Lanolin helps to maintain your skin's normal moisture barrier. Pure lanolin is not harmful (not toxic) to your baby. You may also hand express a few drops of breast milk and gently massage that milk into your nipples and allow the milk to air-dry. In the first few weeks after giving birth, some women experience breast engorgement. Engorgement can make your breasts feel heavy, warm, and tender to the touch. Engorgement peaks within 3-5 days after you give birth. The following recommendations can help to ease engorgement:  Completely empty your breasts while breastfeeding or pumping. You may want to start by applying warm, moist heat (in the shower or with warm, water-soaked hand towels) just before feeding or pumping. This increases circulation and helps the milk flow. If your baby does not completely empty your breasts while breastfeeding, pump any extra milk after he or she is finished.  Apply ice packs to your breasts immediately after breastfeeding or pumping, unless this is too uncomfortable for you. To do this: ? Put ice in a plastic bag. ? Place a towel between your skin and the bag. ? Leave the ice on for 20 minutes, 2-3 times a day.  Make sure that your baby is latched on and positioned properly while breastfeeding. If engorgement persists after 48 hours of following these recommendations, contact your health care provider or a lactation consultant. Overall health care recommendations while breastfeeding  Eat 3 healthy meals and 3 snacks every day. Well-nourished mothers who are breastfeeding need an additional 450-500 calories a day. You can meet this requirement by increasing the amount of a balanced diet that you eat.  Drink  enough water to keep your urine pale yellow or clear.  Rest often, relax, and continue to take your prenatal vitamins to prevent fatigue, stress, and low   vitamin and mineral levels in your body (nutrient deficiencies).  Do not use any products that contain nicotine or tobacco, such as cigarettes and e-cigarettes. Your baby may be harmed by chemicals from cigarettes that pass into breast milk and exposure to secondhand smoke. If you need help quitting, ask your health care provider.  Avoid alcohol.  Do not use illegal drugs or marijuana.  Talk with your health care provider before taking any medicines. These include over-the-counter and prescription medicines as well as vitamins and herbal supplements. Some medicines that may be harmful to your baby can pass through breast milk.  It is possible to become pregnant while breastfeeding. If birth control is desired, ask your health care provider about options that will be safe while breastfeeding your baby. Where to find more information: [employer]: www.llli.org Contact a health care provider if:  You feel like you want to stop breastfeeding or have become frustrated with breastfeeding.  Your nipples are cracked or bleeding.  Your breasts are red, tender, or warm.  You have: ? Painful breasts or nipples. ? A swollen area on either breast. ? A fever or chills. ? Nausea or vomiting. ? Drainage other than breast milk from your nipples.  Your breasts do not become full before feedings by the fifth day after you give birth.  You feel sad and depressed.  Your baby is: ? Too sleepy to eat well. ? Having trouble sleeping. ? More than 1 week old and wetting fewer than 6 diapers in a 24-hour period. ? Not gaining weight by 5 days of age.  Your baby has fewer than 3 stools in a 24-hour period.  Your baby's skin or the white parts of his or her eyes become yellow. Get help right away if:  Your baby is overly tired  (lethargic) and does not want to wake up and feed.  Your baby develops an unexplained fever. Summary  Breastfeeding offers many health benefits for infant and mothers.  Try to breastfeed your infant when he or she shows early signs of hunger.  Gently tickle or stroke your baby's lips with your finger or nipple to allow the baby to open his or her mouth. Bring the baby to your breast. Make sure that much of the areola is in your baby's mouth. Offer one side and burp the baby before you offer the other side.  Talk with your health care provider or lactation consultant if you have questions or you face problems as you breastfeed. This information is not intended to replace advice given to you by your health care provider. Make sure you discuss any questions you have with your health care provider. Document Revised: 01/12/2018 Document Reviewed: 11/19/2016 Elsevier Patient Education  2021 Elsevier Inc.  

## 2021-01-01 ENCOUNTER — Ambulatory Visit: Payer: Medicaid Other | Attending: Maternal & Fetal Medicine

## 2021-01-01 ENCOUNTER — Ambulatory Visit: Payer: Medicaid Other

## 2021-01-01 ENCOUNTER — Other Ambulatory Visit: Payer: Self-pay

## 2021-01-14 ENCOUNTER — Ambulatory Visit (INDEPENDENT_AMBULATORY_CARE_PROVIDER_SITE_OTHER): Payer: Medicaid Other | Admitting: Obstetrics & Gynecology

## 2021-01-14 ENCOUNTER — Encounter: Payer: Self-pay | Admitting: Obstetrics & Gynecology

## 2021-01-14 ENCOUNTER — Other Ambulatory Visit: Payer: Self-pay

## 2021-01-14 VITALS — BP 122/73 | HR 78 | Wt 217.0 lb

## 2021-01-14 DIAGNOSIS — F1911 Other psychoactive substance abuse, in remission: Secondary | ICD-10-CM

## 2021-01-14 DIAGNOSIS — F1120 Opioid dependence, uncomplicated: Secondary | ICD-10-CM

## 2021-01-14 DIAGNOSIS — O0990 Supervision of high risk pregnancy, unspecified, unspecified trimester: Secondary | ICD-10-CM

## 2021-01-14 NOTE — Patient Instructions (Signed)

## 2021-01-14 NOTE — Progress Notes (Signed)
   PRENATAL VISIT NOTE  Subjective:  [name] is a 35 y.o. [id number] at [redacted]w[redacted]d being seen today for ongoing prenatal care.  She is currently monitored for the following issues for this high-risk pregnancy and has Splenomegaly; Generalized anxiety disorder; Gastritis; Nausea and vomiting; Atypical chest pain; Benign essential hypertension; Herpes simplex type 1 infection; Irritable bowel syndrome; Obesity; Intractable vomiting; History of noncompliance with medical treatment; AKI (acute kidney injury) (HCC); Chronic abdominal pain; History of substance abuse (HCC); Intravenous drug use during pregnancy, antepartum; Opioid use disorder, severe, on maintenance therapy (HCC); Anemia affecting pregnancy in third trimester; and Supervision of high risk pregnancy, antepartum on their problem list.  Patient reports no complaints.  Contractions: Not present. Vag. Bleeding: None.  Movement: Present. Denies leaking of fluid.   The following portions of the patient's history were reviewed and updated as appropriate: allergies, current medications, past family history, past medical history, past social history, past surgical history and problem list.   Objective:   Vitals:   01/14/21 1401  BP: 122/73  Pulse: 78  Weight: 217 lb (98.4 kg)    Fetal Status: Fetal Heart Rate (bpm): 136 Fundal Height: 35 cm Movement: Present     General:  Alert, oriented and cooperative. Patient is in no acute distress.  Skin: Skin is warm and dry. No rash noted.   Cardiovascular: Normal heart rate noted  Respiratory: Normal respiratory effort, no problems with respiration noted  Abdomen: Soft, gravid, appropriate for gestational age.  Pain/Pressure: Absent     Pelvic: Cervical exam deferred        Extremities: Normal range of motion.  Edema: Trace  Mental Status: Normal mood and affect. Normal behavior. Normal judgment and thought content.   Assessment and Plan:  Pregnancy: [id number] at [redacted]w[redacted]d 1. Supervision of high  risk pregnancy, antepartum   2. History of substance abuse (HCC) On Suboxone  3. Opioid use disorder, severe, on maintenance therapy (HCC)   Preterm labor symptoms and general obstetric precautions including but not limited to vaginal bleeding, contractions, leaking of fluid and fetal movement were reviewed in detail with the patient. Please refer to After Visit Summary for other counseling recommendations.   Return in about 1 week (around 01/21/2021).  Future Appointments  Date Time Provider Department Center  01/16/2021  1:00 PM [facility] NURSE [facility]  01/16/2021  1:15 PM [facility]    Scheryl Darter, MD

## 2021-01-15 ENCOUNTER — Other Ambulatory Visit: Payer: Self-pay | Admitting: Maternal & Fetal Medicine

## 2021-01-15 DIAGNOSIS — O99323 Drug use complicating pregnancy, third trimester: Secondary | ICD-10-CM

## 2021-01-15 DIAGNOSIS — Z3A35 35 weeks gestation of pregnancy: Secondary | ICD-10-CM

## 2021-01-16 ENCOUNTER — Ambulatory Visit: Payer: Medicaid Other

## 2021-01-21 ENCOUNTER — Encounter: Payer: Medicaid Other | Admitting: Obstetrics and Gynecology

## 2021-01-26 ENCOUNTER — Ambulatory Visit: Payer: Medicaid Other

## 2021-01-26 ENCOUNTER — Ambulatory Visit: Payer: Medicaid Other | Attending: Maternal & Fetal Medicine

## 2021-01-30 ENCOUNTER — Ambulatory Visit: Payer: Medicaid Other

## 2021-02-03 ENCOUNTER — Encounter: Payer: Medicaid Other | Admitting: Obstetrics and Gynecology

## 2021-02-04 ENCOUNTER — Other Ambulatory Visit (HOSPITAL_COMMUNITY)
Admission: RE | Admit: 2021-02-04 | Discharge: 2021-02-04 | Disposition: A | Discharge status: Home / Self Care | Payer: Medicaid Other | Source: Ambulatory Visit | Attending: Obstetrics and Gynecology | Admitting: Obstetrics and Gynecology

## 2021-02-04 ENCOUNTER — Ambulatory Visit (INDEPENDENT_AMBULATORY_CARE_PROVIDER_SITE_OTHER): Payer: Medicaid Other | Admitting: Obstetrics and Gynecology

## 2021-02-04 ENCOUNTER — Encounter: Payer: Self-pay | Admitting: Obstetrics and Gynecology

## 2021-02-04 ENCOUNTER — Other Ambulatory Visit: Payer: Self-pay

## 2021-02-04 VITALS — BP 127/82 | HR 91 | Wt 226.0 lb

## 2021-02-04 DIAGNOSIS — F1911 Other psychoactive substance abuse, in remission: Secondary | ICD-10-CM

## 2021-02-04 DIAGNOSIS — O99013 Anemia complicating pregnancy, third trimester: Secondary | ICD-10-CM

## 2021-02-04 DIAGNOSIS — Z23 Encounter for immunization: Secondary | ICD-10-CM | POA: Diagnosis not present

## 2021-02-04 DIAGNOSIS — O0990 Supervision of high risk pregnancy, unspecified, unspecified trimester: Secondary | ICD-10-CM | POA: Diagnosis present

## 2021-02-04 NOTE — Progress Notes (Signed)
Subjective:  [name] is a 35 y.o. O9B3532 at [redacted]w[redacted]d being seen today for ongoing prenatal care.  She is currently monitored for the following issues for this high-risk pregnancy and has Splenomegaly; Generalized anxiety disorder; Gastritis; Nausea and vomiting; Atypical chest pain; Benign essential hypertension; Herpes simplex type 1 infection; Irritable bowel syndrome; Obesity; Intractable vomiting; History of noncompliance with medical treatment; Chronic abdominal pain; History of substance abuse (HCC); Intravenous drug use during pregnancy, antepartum; Opioid use disorder, severe, on maintenance therapy (HCC); Anemia affecting pregnancy in third trimester; and Supervision of high risk pregnancy, antepartum on their problem list.  Patient reports general discomforts of pregnancy.  Contractions: Not present. Vag. Bleeding: None.  Movement: Present. Denies leaking of fluid.   The following portions of the patient's history were reviewed and updated as appropriate: allergies, current medications, past family history, past medical history, past social history, past surgical history and problem list. Problem list updated.  Objective:   Vitals:   02/04/21 1550  BP: 127/82  Pulse: 91  Weight: 226 lb (102.5 kg)    Fetal Status: Fetal Heart Rate (bpm): 135   Movement: Present     General:  Alert, oriented and cooperative. Patient is in no acute distress.  Skin: Skin is warm and dry. No rash noted.   Cardiovascular: Normal heart rate noted  Respiratory: Normal respiratory effort, no problems with respiration noted  Abdomen: Soft, gravid, appropriate for gestational age. Pain/Pressure: Absent     Pelvic:  Cervical exam performed        Extremities: Normal range of motion.     Mental Status: Normal mood and affect. Normal behavior. Normal judgment and thought content.   Urinalysis:      Assessment and Plan:  Pregnancy: G4P1021 at [redacted]w[redacted]d  1. Anemia affecting pregnancy in third  trimester Stable  2. Supervision of high risk pregnancy, antepartum Labor precautions - Strep Gp B NAA - Cervicovaginal ancillary only( [place])  3. History of substance abuse (HCC) Stable Continue with subaxone  Term labor symptoms and general obstetric precautions including but not limited to vaginal bleeding, contractions, leaking of fluid and fetal movement were reviewed in detail with the patient. Please refer to After Visit Summary for other counseling recommendations.  Return in about 1 week (around 02/11/2021) for OB visit, face to face, MD only.   Hermina Staggers, MD

## 2021-02-04 NOTE — Patient Instructions (Signed)

## 2021-02-05 LAB — CERVICOVAGINAL ANCILLARY ONLY
Chlamydia: NEGATIVE
Comment: NEGATIVE
Comment: NORMAL
Neisseria Gonorrhea: NEGATIVE

## 2021-02-06 LAB — STREP GP B NAA: Strep Gp B NAA: NEGATIVE

## 2021-02-11 ENCOUNTER — Encounter: Payer: Medicaid Other | Admitting: Obstetrics and Gynecology

## 2021-02-12 ENCOUNTER — Other Ambulatory Visit: Payer: Self-pay

## 2021-02-12 ENCOUNTER — Encounter: Payer: Self-pay | Admitting: *Deleted

## 2021-02-12 ENCOUNTER — Ambulatory Visit: Payer: Medicaid Other | Admitting: *Deleted

## 2021-02-12 ENCOUNTER — Ambulatory Visit: Payer: Medicaid Other | Attending: Maternal & Fetal Medicine

## 2021-02-12 DIAGNOSIS — O0990 Supervision of high risk pregnancy, unspecified, unspecified trimester: Secondary | ICD-10-CM

## 2021-02-12 DIAGNOSIS — O99013 Anemia complicating pregnancy, third trimester: Secondary | ICD-10-CM | POA: Diagnosis present

## 2021-02-12 DIAGNOSIS — O0993 Supervision of high risk pregnancy, unspecified, third trimester: Secondary | ICD-10-CM | POA: Diagnosis not present

## 2021-02-12 DIAGNOSIS — Z3A35 35 weeks gestation of pregnancy: Secondary | ICD-10-CM | POA: Diagnosis not present

## 2021-02-12 DIAGNOSIS — O99323 Drug use complicating pregnancy, third trimester: Secondary | ICD-10-CM | POA: Diagnosis not present

## 2021-02-14 ENCOUNTER — Inpatient Hospital Stay (HOSPITAL_COMMUNITY): Payer: Medicaid Other | Admitting: Anesthesiology

## 2021-02-14 ENCOUNTER — Other Ambulatory Visit: Payer: Self-pay

## 2021-02-14 ENCOUNTER — Inpatient Hospital Stay (HOSPITAL_COMMUNITY)
Admission: AD | Admit: 2021-02-14 | Discharge: 2021-02-18 | DRG: 806 | Disposition: A | Discharge status: Home / Self Care | Payer: Medicaid Other | Attending: Obstetrics and Gynecology | Admitting: Obstetrics and Gynecology

## 2021-02-14 ENCOUNTER — Encounter (HOSPITAL_COMMUNITY): Payer: Self-pay | Admitting: Obstetrics & Gynecology

## 2021-02-14 DIAGNOSIS — Z20822 Contact with and (suspected) exposure to covid-19: Secondary | ICD-10-CM | POA: Diagnosis present

## 2021-02-14 DIAGNOSIS — F1120 Opioid dependence, uncomplicated: Secondary | ICD-10-CM | POA: Diagnosis present

## 2021-02-14 DIAGNOSIS — O9902 Anemia complicating childbirth: Secondary | ICD-10-CM | POA: Diagnosis present

## 2021-02-14 DIAGNOSIS — O114 Pre-existing hypertension with pre-eclampsia, complicating childbirth: Principal | ICD-10-CM | POA: Diagnosis present

## 2021-02-14 DIAGNOSIS — I10 Essential (primary) hypertension: Secondary | ICD-10-CM | POA: Diagnosis present

## 2021-02-14 DIAGNOSIS — O0990 Supervision of high risk pregnancy, unspecified, unspecified trimester: Secondary | ICD-10-CM

## 2021-02-14 DIAGNOSIS — O1002 Pre-existing essential hypertension complicating childbirth: Secondary | ICD-10-CM | POA: Diagnosis present

## 2021-02-14 DIAGNOSIS — O99013 Anemia complicating pregnancy, third trimester: Secondary | ICD-10-CM | POA: Diagnosis present

## 2021-02-14 DIAGNOSIS — O99320 Drug use complicating pregnancy, unspecified trimester: Secondary | ICD-10-CM | POA: Diagnosis present

## 2021-02-14 DIAGNOSIS — O99324 Drug use complicating childbirth: Secondary | ICD-10-CM | POA: Diagnosis present

## 2021-02-14 DIAGNOSIS — O1092 Unspecified pre-existing hypertension complicating childbirth: Secondary | ICD-10-CM | POA: Diagnosis not present

## 2021-02-14 DIAGNOSIS — O1490 Unspecified pre-eclampsia, unspecified trimester: Secondary | ICD-10-CM | POA: Diagnosis present

## 2021-02-14 DIAGNOSIS — R1011 Right upper quadrant pain: Secondary | ICD-10-CM | POA: Diagnosis present

## 2021-02-14 DIAGNOSIS — F1911 Other psychoactive substance abuse, in remission: Secondary | ICD-10-CM | POA: Diagnosis present

## 2021-02-14 DIAGNOSIS — Z3A39 39 weeks gestation of pregnancy: Secondary | ICD-10-CM

## 2021-02-14 DIAGNOSIS — F411 Generalized anxiety disorder: Secondary | ICD-10-CM | POA: Diagnosis present

## 2021-02-14 DIAGNOSIS — O1093 Unspecified pre-existing hypertension complicating the puerperium: Secondary | ICD-10-CM | POA: Diagnosis not present

## 2021-02-14 DIAGNOSIS — R112 Nausea with vomiting, unspecified: Secondary | ICD-10-CM | POA: Diagnosis present

## 2021-02-14 LAB — RAPID URINE DRUG SCREEN, HOSP PERFORMED
Amphetamines: NOT DETECTED
Barbiturates: NOT DETECTED
Benzodiazepines: NOT DETECTED
Cocaine: NOT DETECTED
Opiates: NOT DETECTED
Tetrahydrocannabinol: NOT DETECTED

## 2021-02-14 LAB — URINALYSIS, ROUTINE W REFLEX MICROSCOPIC
Bilirubin Urine: NEGATIVE
Glucose, UA: NEGATIVE mg/dL
Hgb urine dipstick: NEGATIVE
Ketones, ur: 5 mg/dL — AB
Leukocytes,Ua: NEGATIVE
Nitrite: NEGATIVE
Protein, ur: NEGATIVE mg/dL
Specific Gravity, Urine: 1.02 (ref 1.005–1.030)
pH: 5 (ref 5.0–8.0)

## 2021-02-14 LAB — COMPREHENSIVE METABOLIC PANEL
ALT: 15 U/L (ref 0–44)
AST: 20 U/L (ref 15–41)
Albumin: 2.7 g/dL — ABNORMAL LOW (ref 3.5–5.0)
Alkaline Phosphatase: 105 U/L (ref 38–126)
Anion gap: 5 (ref 5–15)
BUN: 13 mg/dL (ref 6–20)
CO2: 21 mmol/L — ABNORMAL LOW (ref 22–32)
Calcium: 8.7 mg/dL — ABNORMAL LOW (ref 8.9–10.3)
Chloride: 108 mmol/L (ref 98–111)
Creatinine, Ser: 0.74 mg/dL (ref 0.44–1.00)
GFR, Estimated: >60 mL/min (ref >60)
Glucose, Bld: 91 mg/dL (ref 70–99)
Potassium: 4.1 mmol/L (ref 3.5–5.1)
Sodium: 134 mmol/L — ABNORMAL LOW (ref 135–145)
Total Bilirubin: 0.5 mg/dL (ref 0.3–1.2)
Total Protein: 5.7 g/dL — ABNORMAL LOW (ref 6.5–8.1)

## 2021-02-14 LAB — RESP PANEL BY RT-PCR (FLU A&B, COVID) ARPGX2
Influenza A by PCR: NEGATIVE
Influenza B by PCR: NEGATIVE
SARS Coronavirus 2 by RT PCR: NEGATIVE

## 2021-02-14 LAB — CBC
HCT: 34.6 % — ABNORMAL LOW (ref 36.0–46.0)
Hemoglobin: 11.4 g/dL — ABNORMAL LOW (ref 12.0–15.0)
MCH: 29.5 pg (ref 26.0–34.0)
MCHC: 32.9 g/dL (ref 30.0–36.0)
MCV: 89.4 fL (ref 80.0–100.0)
Platelets: 182 10*3/uL (ref 150–400)
RBC: 3.87 MIL/uL (ref 3.87–5.11)
RDW: 12.6 % (ref 11.5–15.5)
WBC: 10 10*3/uL (ref 4.0–10.5)
nRBC: 0 % (ref 0.0–0.2)

## 2021-02-14 LAB — TYPE AND SCREEN
ABO/RH(D): O POS
Antibody Screen: NEGATIVE

## 2021-02-14 LAB — PROTEIN / CREATININE RATIO, URINE
Creatinine, Urine: 119.74 mg/dL
Protein Creatinine Ratio: 0.12 mg/mg{Cre} (ref 0.00–0.15)
Total Protein, Urine: 14 mg/dL

## 2021-02-14 MED ORDER — OXYTOCIN-SODIUM CHLORIDE 30-0.9 UT/500ML-% IV SOLN: 2.5000 [IU]/h | INTRAVENOUS | Status: DC

## 2021-02-14 MED ORDER — HYDRALAZINE HCL 20 MG/ML IJ SOLN: 10.0000 mg | INTRAMUSCULAR | Status: DC | PRN

## 2021-02-14 MED ORDER — LACTATED RINGERS IV SOLN: 500.0000 mL | INTRAVENOUS | Status: DC | PRN

## 2021-02-14 MED ORDER — PHENYLEPHRINE 40 MCG/ML (10ML) SYRINGE FOR IV PUSH (FOR BLOOD PRESSURE SUPPORT): 80.0000 ug | PREFILLED_SYRINGE | INTRAVENOUS | Status: DC | PRN

## 2021-02-14 MED ORDER — ZOLPIDEM TARTRATE 5 MG PO TABS: 5.0000 mg | ORAL_TABLET | Freq: Every evening | ORAL | Status: DC | PRN

## 2021-02-14 MED ORDER — OXYTOCIN-SODIUM CHLORIDE 30-0.9 UT/500ML-% IV SOLN
1.0000 m[IU]/min | INTRAVENOUS | Status: DC
  Administered 2021-02-14: 2 m[IU]/min via INTRAVENOUS
  Filled 2021-02-14: qty 500

## 2021-02-14 MED ORDER — FENTANYL-BUPIVACAINE-NACL 0.5-0.125-0.9 MG/250ML-% EP SOLN
EPIDURAL | Status: AC
  Filled 2021-02-14: qty 250

## 2021-02-14 MED ORDER — LABETALOL HCL 5 MG/ML IV SOLN: 40.0000 mg | INTRAVENOUS | Status: DC | PRN

## 2021-02-14 MED ORDER — SOD CITRATE-CITRIC ACID 500-334 MG/5ML PO SOLN: 30.0000 mL | ORAL | Status: DC | PRN

## 2021-02-14 MED ORDER — LIDOCAINE HCL (PF) 1 % IJ SOLN: 30.0000 mL | INTRAMUSCULAR | Status: DC | PRN

## 2021-02-14 MED ORDER — OXYTOCIN BOLUS FROM INFUSION: 333.0000 mL | Freq: Once | INTRAVENOUS | Status: DC

## 2021-02-14 MED ORDER — LACTATED RINGERS IV SOLN: INTRAVENOUS | Status: DC

## 2021-02-14 MED ORDER — LABETALOL HCL 5 MG/ML IV SOLN: 80.0000 mg | INTRAVENOUS | Status: DC | PRN

## 2021-02-14 MED ORDER — EPHEDRINE 5 MG/ML INJ: 10.0000 mg | INTRAVENOUS | Status: DC | PRN

## 2021-02-14 MED ORDER — TERBUTALINE SULFATE 1 MG/ML IJ SOLN: 0.2500 mg | Freq: Once | INTRAMUSCULAR | Status: DC | PRN

## 2021-02-14 MED ORDER — HYDROXYZINE HCL 50 MG PO TABS
50.0000 mg | ORAL_TABLET | Freq: Four times a day (QID) | ORAL | Status: DC | PRN
  Administered 2021-02-15: 50 mg via ORAL
  Filled 2021-02-14: qty 2

## 2021-02-14 MED ORDER — MISOPROSTOL 25 MCG QUARTER TABLET
25.0000 ug | ORAL_TABLET | ORAL | Status: DC | PRN
  Administered 2021-02-14: 25 ug via VAGINAL
  Filled 2021-02-14: qty 1

## 2021-02-14 MED ORDER — ONDANSETRON HCL 4 MG/2ML IJ SOLN
4.0000 mg | Freq: Four times a day (QID) | INTRAMUSCULAR | Status: DC | PRN
  Administered 2021-02-14: 4 mg via INTRAVENOUS
  Filled 2021-02-14: qty 2

## 2021-02-14 MED ORDER — FLEET ENEMA 7-19 GM/118ML RE ENEM: 1.0000 | ENEMA | RECTAL | Status: DC | PRN

## 2021-02-14 MED ORDER — LACTATED RINGERS IV SOLN
500.0000 mL | Freq: Once | INTRAVENOUS | Status: AC
  Administered 2021-02-14: 500 mL via INTRAVENOUS

## 2021-02-14 MED ORDER — SODIUM CHLORIDE 0.9 % IV SOLN
12.5000 mg | Freq: Once | INTRAVENOUS | Status: AC
  Administered 2021-02-14: 12.5 mg via INTRAVENOUS
  Filled 2021-02-14: qty 0.5

## 2021-02-14 MED ORDER — DIPHENHYDRAMINE HCL 50 MG/ML IJ SOLN: 12.5000 mg | INTRAMUSCULAR | Status: DC | PRN

## 2021-02-14 MED ORDER — LABETALOL HCL 5 MG/ML IV SOLN: 20.0000 mg | INTRAVENOUS | Status: DC | PRN

## 2021-02-14 MED ORDER — ACETAMINOPHEN 325 MG PO TABS: 650.0000 mg | ORAL_TABLET | ORAL | Status: DC | PRN

## 2021-02-14 MED ORDER — LIDOCAINE HCL (PF) 1 % IJ SOLN
INTRAMUSCULAR | Status: DC | PRN
  Administered 2021-02-14: 6 mL via EPIDURAL
  Administered 2021-02-14: 3 mL via EPIDURAL

## 2021-02-14 MED ORDER — FENTANYL-BUPIVACAINE-NACL 0.5-0.125-0.9 MG/250ML-% EP SOLN
12.0000 mL/h | EPIDURAL | Status: DC | PRN
  Administered 2021-02-14 – 2021-02-15 (×2): 12 mL/h via EPIDURAL
  Filled 2021-02-14: qty 250

## 2021-02-14 NOTE — Progress Notes (Signed)
Pt c/o constant upper abd pain w/ NV. Pain 10/10.   Hypertensive upon admission with pressures in severe range at time of epidural placement.  Dr. Curt Jews requested to hold off on starting hypertensive protocol due to placing the epidural at that time and anticipating a therapeutic drop in BP back under severe range (severe range per policy=160 systolic/110 diastolic) which was achieved. Epidural placed and pt was sleeping within 5 minutes of placement.  Pt very restless in bed.  When awake, pt c/o upper abd pain 7/10 and requesting pain medication. Informed of nonpharmaceutical options for pain relief since epidural is now in place.  Provided position changes, water, and warm blankets.  Pt snoring with RR 17, BP 144/80, HR 63, and in no acute distress.

## 2021-02-14 NOTE — Anesthesia Preprocedure Evaluation (Signed)
Anesthesia Evaluation  Patient identified by MRN, date of birth, ID band Patient awake    Reviewed: Allergy & Precautions, NPO status , Patient's Chart, lab work & pertinent test results  Airway Mallampati: II  TM Distance: >3 FB Neck ROM: Full    Dental  (+) Teeth Intact   Pulmonary neg pulmonary ROS,    Pulmonary exam normal        Cardiovascular hypertension,  Rhythm:Regular Rate:Normal     Neuro/Psych Anxiety Depression negative neurological ROS     GI/Hepatic GERD  Medicated,(+)     substance abuse (on suboxone)  ,   Endo/Other    Renal/GU      Musculoskeletal  (+) narcotic dependent  Abdominal Normal abdominal exam  (+)   Peds  Hematology   Anesthesia Other Findings   Reproductive/Obstetrics                            Anesthesia Physical Anesthesia Plan  ASA: III  Anesthesia Plan: Epidural   Post-op Pain Management:    Induction:   PONV Risk Score and Plan: 2 and Treatment may vary due to age or medical condition  Airway Management Planned: Natural Airway  Additional Equipment: None  Intra-op Plan:   Post-operative Plan:   Informed Consent: I have reviewed the patients History and Physical, chart, labs and discussed the procedure including the risks, benefits and alternatives for the proposed anesthesia with the patient or authorized representative who has indicated his/her understanding and acceptance.     Dental advisory given  Plan Discussed with:   Anesthesia Plan Comments: (Lab Results      Component                Value               Date                      WBC                      10.0                02/14/2021                HGB                      11.4 (L)            02/14/2021                HCT                      34.6 (L)            02/14/2021                MCV                      89.4                02/14/2021                PLT                       182                 02/14/2021          )  Anesthesia Quick Evaluation  

## 2021-02-14 NOTE — MAU Note (Signed)
EMS arrival. Pt c/o abd pain and pressure on and off since this morning.  Elevated b/p reported by EMT 179/110.  Pt reports pian in uper right quadrant. Denies any headache or visual changed. C/O n/v.  Good fetal movement felt. Denies any vag bleeding or leaking at this time.

## 2021-02-14 NOTE — Progress Notes (Addendum)
Patient ID: [name], female   DOB: [birth year], 35 y.o.   MRN: [id number] Paitent originally seen at 2030 hrs Had been pushing her epidural PCA frequently earlier States pain was epigastric at the time, states that pain has resolved now.  Pain was never in lower abdomen  Doing well.   Vitals:   02/14/21 1930 02/14/21 2000 02/14/21 2030 02/14/21 2130  BP: 110/62 (!) 92/53 116/65 101/61  Pulse: 71 66 73 74  Resp: 16 16 16 18   Temp: 97.6 F (36.4 C)     TempSrc: Oral      FHR reactive There was a single deep variable deceleration (spontaneous) at 2130 which lasted just over a minute, relieved by position changes  No further decels, and FHR has been reactive with good variability since then  UCs irregular q95min  Dilation: 4 Effacement (%): 70 Cervical Position: Middle Station: -3 Presentation: Vertex Exam by:: [email or web address] RN At 1853 hrs  Will continue Pitocin since FHR pattern is reassuring
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
## 2021-02-14 NOTE — Progress Notes (Signed)
[name] is a 35 y.o. U2G2542 at [redacted]w[redacted]d admitted for induction of labor due to Hypertension.  Subjective:  Comfortable with epidural. RUQ pain has now mostly resolved.   Objective: BP (!) 151/104   Pulse 63   Temp 98.9 F (37.2 C) (Oral)   Resp 18   LMP 04/13/2020 (Approximate)  No intake/output data recorded. No intake/output data recorded.  FHT:  FHR: 135 bpm, variability: moderate,  accelerations:  Present,  decelerations:  Absent UC:   rare SVE:   Dilation: 1 Effacement (%): 70 Station: -3 Exam by:: Thressa Sheller CNM   Foley Bulb Placement  Appropriate time out taken. The patient was placed in the lithotomy position and a cervical exam was performed and a finger was used to guide the 69F foley through the internal os of the cervix. Foley Balloon filled with 60cc of normal saline. Plug inserted into end of the foley. Foley placed on tension and taped to medial thigh.    Labs: Lab Results  Component Value Date   WBC 10.0 02/14/2021   HGB 11.4 (L) 02/14/2021   HCT 34.6 (L) 02/14/2021   MCV 89.4 02/14/2021   PLT 182 02/14/2021    Assessment / Plan: IOL with one dose of cytotec given and now FB placed   Labor: cervical ripening phase  Preeclampsia:  Labs normal, BP stabilized after epidural. CW Dr. Charlotta Newton and will hold mag for now and monitor BP/RUQ pain.  Fetal Wellbeing:  Category I Pain Control:  Epidural I/D:  n/a Anticipated MOD:  NSVD  Thressa Sheller DNP, CNM  02/14/21  3:59 PM

## 2021-02-14 NOTE — Anesthesia Procedure Notes (Signed)
Epidural Patient location during procedure: OB Start time: 02/14/2021 2:10 PM End time: 02/14/2021 2:20 PM  Staffing Anesthesiologist: Atilano Median, DO Performed: anesthesiologist   Preanesthetic Checklist Completed: patient identified, IV checked, site marked, risks and benefits discussed, surgical consent, monitors and equipment checked, pre-op evaluation and timeout performed  Epidural Patient position: sitting Prep: ChloraPrep Patient monitoring: heart rate, continuous pulse ox and blood pressure Approach: midline Location: L4-L5 Injection technique: LOR saline  Needle:  Needle type: Tuohy  Needle gauge: 17 G Needle length: 9 cm Needle insertion depth: 7 cm Catheter type: closed end flexible Catheter size: 20 Guage Catheter at skin depth: 12 cm Test dose: negative and 1.5% lidocaine  Assessment Sensory level: T8 Events: blood not aspirated, injection not painful, no injection resistance and no paresthesia  Additional Notes   Patient identified. Risks/Benefits/Options discussed with patient including but not limited to bleeding, infection, nerve damage, paralysis, failed block, incomplete pain control, headache, blood pressure changes, nausea, vomiting, reactions to medications, itching and postpartum back pain. Confirmed with bedside nurse the patient's most recent platelet count. Confirmed with patient that they are not currently taking any anticoagulation, have any bleeding history or any family history of bleeding disorders. Patient expressed understanding and wished to proceed. All questions were answered. Sterile technique was used throughout the entire procedure. Please see nursing notes for vital signs. Test dose was given through epidural catheter and negative prior to continuing to dose epidural or start infusion. Warning signs of high block given to the patient including shortness of breath, tingling/numbness in hands, complete motor block, or any concerning  symptoms with instructions to call for help. Patient was given instructions on fall risk and not to get out of bed. All questions and concerns addressed with instructions to call with any issues or inadequate analgesia.    Reason for block:procedure for pain

## 2021-02-15 DIAGNOSIS — O115 Pre-existing hypertension with pre-eclampsia, complicating the puerperium: Secondary | ICD-10-CM | POA: Diagnosis not present

## 2021-02-15 DIAGNOSIS — O114 Pre-existing hypertension with pre-eclampsia, complicating childbirth: Secondary | ICD-10-CM

## 2021-02-15 DIAGNOSIS — O1093 Unspecified pre-existing hypertension complicating the puerperium: Secondary | ICD-10-CM | POA: Diagnosis not present

## 2021-02-15 DIAGNOSIS — R1011 Right upper quadrant pain: Secondary | ICD-10-CM | POA: Diagnosis present

## 2021-02-15 DIAGNOSIS — Z3A39 39 weeks gestation of pregnancy: Secondary | ICD-10-CM

## 2021-02-15 DIAGNOSIS — O1092 Unspecified pre-existing hypertension complicating childbirth: Secondary | ICD-10-CM

## 2021-02-15 DIAGNOSIS — F1120 Opioid dependence, uncomplicated: Secondary | ICD-10-CM

## 2021-02-15 LAB — CBC
HCT: 30.2 % — ABNORMAL LOW (ref 36.0–46.0)
Hemoglobin: 10.4 g/dL — ABNORMAL LOW (ref 12.0–15.0)
MCH: 30.5 pg (ref 26.0–34.0)
MCHC: 34.4 g/dL (ref 30.0–36.0)
MCV: 88.6 fL (ref 80.0–100.0)
Platelets: 159 10*3/uL (ref 150–400)
RBC: 3.41 MIL/uL — ABNORMAL LOW (ref 3.87–5.11)
RDW: 12.6 % (ref 11.5–15.5)
WBC: 20.6 10*3/uL — ABNORMAL HIGH (ref 4.0–10.5)
nRBC: 0 % (ref 0.0–0.2)

## 2021-02-15 LAB — RPR: RPR Ser Ql: NONREACTIVE

## 2021-02-15 MED ORDER — PROMETHAZINE HCL 25 MG PO TABS
25.0000 mg | ORAL_TABLET | Freq: Four times a day (QID) | ORAL | Status: DC | PRN
  Administered 2021-02-15 (×2): 25 mg via ORAL
  Filled 2021-02-15 (×2): qty 1

## 2021-02-15 MED ORDER — ONDANSETRON HCL 4 MG/2ML IJ SOLN: 4.0000 mg | INTRAMUSCULAR | Status: DC | PRN

## 2021-02-15 MED ORDER — MEDROXYPROGESTERONE ACETATE 150 MG/ML IM SUSP: 150.0000 mg | INTRAMUSCULAR | Status: DC | PRN

## 2021-02-15 MED ORDER — ALUM & MAG HYDROXIDE-SIMETH 200-200-20 MG/5ML PO SUSP: 30.0000 mL | Freq: Once | ORAL | Status: DC

## 2021-02-15 MED ORDER — NIFEDIPINE 10 MG PO CAPS
20.0000 mg | ORAL_CAPSULE | ORAL | Status: DC | PRN
  Filled 2021-02-15: qty 2

## 2021-02-15 MED ORDER — BENZOCAINE-MENTHOL 20-0.5 % EX AERO
1.0000 "application " | INHALATION_SPRAY | CUTANEOUS | Status: DC | PRN
  Administered 2021-02-16: 1 via TOPICAL
  Filled 2021-02-15 (×2): qty 56

## 2021-02-15 MED ORDER — ALUM & MAG HYDROXIDE-SIMETH 200-200-20 MG/5ML PO SUSP
30.0000 mL | Freq: Once | ORAL | Status: AC
  Administered 2021-02-15: 30 mL via ORAL
  Filled 2021-02-15: qty 30

## 2021-02-15 MED ORDER — LACTATED RINGERS IV SOLN: INTRAVENOUS | Status: DC

## 2021-02-15 MED ORDER — DIPHENHYDRAMINE HCL 25 MG PO CAPS: 25.0000 mg | ORAL_CAPSULE | Freq: Four times a day (QID) | ORAL | Status: DC | PRN

## 2021-02-15 MED ORDER — DIBUCAINE (PERIANAL) 1 % EX OINT: 1.0000 "application " | TOPICAL_OINTMENT | CUTANEOUS | Status: DC | PRN

## 2021-02-15 MED ORDER — IBUPROFEN 600 MG PO TABS
600.0000 mg | ORAL_TABLET | Freq: Four times a day (QID) | ORAL | Status: DC
  Administered 2021-02-15 – 2021-02-18 (×9): 600 mg via ORAL
  Filled 2021-02-15 (×14): qty 1

## 2021-02-15 MED ORDER — MEASLES, MUMPS & RUBELLA VAC IJ SOLR: 0.5000 mL | Freq: Once | INTRAMUSCULAR | Status: DC

## 2021-02-15 MED ORDER — FERROUS SULFATE 325 (65 FE) MG PO TABS
325.0000 mg | ORAL_TABLET | ORAL | Status: DC
  Filled 2021-02-15 (×2): qty 1

## 2021-02-15 MED ORDER — ACETAMINOPHEN 325 MG PO TABS
650.0000 mg | ORAL_TABLET | ORAL | Status: DC | PRN
  Administered 2021-02-15 – 2021-02-17 (×4): 650 mg via ORAL
  Filled 2021-02-15 (×4): qty 2

## 2021-02-15 MED ORDER — NIFEDIPINE 10 MG PO CAPS
10.0000 mg | ORAL_CAPSULE | ORAL | Status: DC | PRN
  Administered 2021-02-15: 10 mg via ORAL
  Filled 2021-02-15 (×2): qty 1

## 2021-02-15 MED ORDER — SIMETHICONE 80 MG PO CHEW: 80.0000 mg | CHEWABLE_TABLET | ORAL | Status: DC | PRN

## 2021-02-15 MED ORDER — SENNOSIDES-DOCUSATE SODIUM 8.6-50 MG PO TABS
2.0000 | ORAL_TABLET | ORAL | Status: DC
  Administered 2021-02-16 – 2021-02-18 (×2): 2 via ORAL
  Filled 2021-02-15 (×3): qty 2

## 2021-02-15 MED ORDER — ONDANSETRON HCL 4 MG PO TABS
4.0000 mg | ORAL_TABLET | ORAL | Status: DC | PRN
  Filled 2021-02-15: qty 1

## 2021-02-15 MED ORDER — PRENATAL MULTIVITAMIN CH
1.0000 | ORAL_TABLET | Freq: Every day | ORAL | Status: DC
  Administered 2021-02-16 – 2021-02-18 (×3): 1 via ORAL
  Filled 2021-02-15 (×4): qty 1

## 2021-02-15 MED ORDER — COCONUT OIL OIL: 1.0000 "application " | TOPICAL_OIL | Status: DC | PRN

## 2021-02-15 MED ORDER — LABETALOL HCL 5 MG/ML IV SOLN: 40.0000 mg | INTRAVENOUS | Status: DC | PRN

## 2021-02-15 MED ORDER — LIDOCAINE VISCOUS HCL 2 % MT SOLN
15.0000 mL | Freq: Once | OROMUCOSAL | Status: AC
  Administered 2021-02-15: 15 mL via ORAL
  Filled 2021-02-15: qty 15

## 2021-02-15 MED ORDER — ZOLPIDEM TARTRATE 5 MG PO TABS: 5.0000 mg | ORAL_TABLET | Freq: Every evening | ORAL | Status: DC | PRN

## 2021-02-15 MED ORDER — DOCUSATE SODIUM 100 MG PO CAPS: 100.0000 mg | ORAL_CAPSULE | Freq: Two times a day (BID) | ORAL | Status: DC

## 2021-02-15 MED ORDER — WITCH HAZEL-GLYCERIN EX PADS: 1.0000 "application " | MEDICATED_PAD | CUTANEOUS | Status: DC | PRN

## 2021-02-15 MED ORDER — BUPRENORPHINE HCL-NALOXONE HCL 2-0.5 MG SL SUBL
1.0000 | SUBLINGUAL_TABLET | Freq: Every day | SUBLINGUAL | Status: DC
  Administered 2021-02-15: 1 via SUBLINGUAL
  Filled 2021-02-15 (×2): qty 1

## 2021-02-15 MED ORDER — MAGNESIUM SULFATE BOLUS VIA INFUSION
4.0000 g | Freq: Once | INTRAVENOUS | Status: AC
  Administered 2021-02-16: 4 g via INTRAVENOUS
  Filled 2021-02-15: qty 1000

## 2021-02-15 MED ORDER — MAGNESIUM SULFATE 40 GM/1000ML IV SOLN
2.0000 g/h | INTRAVENOUS | Status: AC
  Administered 2021-02-16 (×3): 2 g/h via INTRAVENOUS
  Filled 2021-02-15 (×2): qty 1000

## 2021-02-15 MED ORDER — TETANUS-DIPHTH-ACELL PERTUSSIS 5-2.5-18.5 LF-MCG/0.5 IM SUSY: 0.5000 mL | PREFILLED_SYRINGE | Freq: Once | INTRAMUSCULAR | Status: DC

## 2021-02-15 NOTE — Progress Notes (Signed)
Patient ID: [name], female   DOB: [birth year], 35 y.o.   MRN: [id number] I was planning on AROM augmentation when RN called to tell me patient was 9+cm RN states SROM has evidently already occurred  Vitals:   02/15/21 0300 02/15/21 0320 02/15/21 0330 02/15/21 0400  BP: 125/75  132/88 (!) 146/93  Pulse: 80  81 80  Resp: 18 18 18 18   Temp:      TempSrc:      Weight:      Height:       FHR 140, reactive UCs every 2-58min  Dilation: 10 Effacement (%): 50 Cervical Position: Middle Station: Plus 1 Presentation: Vertex Exam by:: [email or web address] RN, Yvonne O'Rear RN  Plan to labor down until feeling pressure

## 2021-02-15 NOTE — Progress Notes (Signed)
When assisting RN by getting a blood pressure, patient was clinching her arm tightly. I told her to relax her arm when getting pressures checked.

## 2021-02-15 NOTE — Lactation Note (Addendum)
This note was copied from a baby's chart. Lactation Consultation Note  Patient Name: [name] [id number] Date: 02/15/2021 Reason for consult: L&D Initial assessment;1st time breastfeeding;Primapara;Term;Other (Comment) (hx of opiates abuse, 12/2020 - according to the chart - was placed/ suboxone,/ only had 1 precription filled. baby attempted to latch/ laid back position, spitty , used the bulb syringe x 2. STS , LC reassured mom  baby clears the mucous will eat better.) Age:14 hours/ P 1 - Latch score 5  LC called report to [employer] Fava/ IBCLC  Mom UDS - Negative 02/14/2021  Maternal Data    Feeding Mother's Current Feeding Choice: Breast Milk  LATCH Score Latch: Repeated attempts needed to sustain latch, nipple held in mouth throughout feeding, stimulation needed to elicit sucking reflex.  Audible Swallowing: None  Type of Nipple: Everted at rest and after stimulation (some areola edema)  Comfort (Breast/Nipple): Soft / non-tender  Hold (Positioning): Full assist, staff holds infant at breast  LATCH Score: 5   Lactation Tools Discussed/Used    Interventions Interventions: Breast feeding basics reviewed;Education  Discharge    Consult Status Consult Status: Follow-up Date: 02/15/21 Follow-up type: In-patient    [name] 02/15/2021, 10:29 AM

## 2021-02-15 NOTE — Progress Notes (Signed)
NICU NP at bedside.

## 2021-02-15 NOTE — Progress Notes (Signed)
Poor pushing effort.  Discussed POC for vacuum delivery.  Answered questions.

## 2021-02-15 NOTE — Progress Notes (Signed)
Pt refusing to push.  Repositioned onto left lateral.  Will resume pushing when feels better.  GI cocktail given po.  Family at bedside for support.

## 2021-02-15 NOTE — Progress Notes (Signed)
Patient ID: [name], female   DOB: 1986-03-09, 35 y.o.   MRN: [id number] Making steady progress  Vitals:   02/14/21 2330 02/15/21 0000 02/15/21 0030 02/15/21 0100  BP: 102/64 (!) 100/48 101/60 121/77  Pulse: 75 66 76 74  Resp: 18 18 18 18   Temp:      TempSrc:       FHR reactive UCs every 2.68min  Dilation: 5.5 Effacement (%): 50 Cervical Position: Middle Station: -2 Presentation: Vertex Exam by:: [email or web address] RN  Good progress in labor Anticipate SVD

## 2021-02-15 NOTE — Progress Notes (Addendum)
Patient ID: [name], female   DOB: December 12, 1985, 35 y.o.   MRN: [id number] Late entry:  Progressed to 10cm at 0345 Pushed for over an hour with progression of vertex to dime size crowning Perineum tight  Pushing effort mediocre, due to patient having recurrent epigastric pain She used her epidural button which helped earlier but not now I ordered her a GI cocktail which was just given  Patient refuses to push anymore until epigastric pain improves  FHR has remained reassuring throughout  UCs were difficult to trace or palpate, so I inserted an IUPC.  Will encourage her to resume pushing when able

## 2021-02-15 NOTE — Progress Notes (Signed)
    Faculty Practice OB/GYN Attending Note  Observed patient pushing, fetus at +4 station.  Poor maternal effort noted after 3 contractions, no descent of fetal head.  Reassuring FHR tracing.  Discussed this with patient, she has been in second stage since 46.  Recommended vacuum assistance.  Risks of vacuum assistance were discussed in detail, including but not limited to, bleeding, infection, damage to maternal tissues, fetal cephalohematoma, inability to effect vaginal delivery of the head or shoulder dystocia that cannot be resolved by established maneuvers and need for emergency cesarean section.  Patient gave verbal consent.  Will proceed soon with vacuum assistance, hopeful for vaginal delivery.   Jaynie Collins, MD, FACOG Obstetrician & Gynecologist, [facility] for [employer], [facility]

## 2021-02-15 NOTE — Anesthesia Postprocedure Evaluation (Signed)
Anesthesia Post Note  Patient: [name]  Procedure(s) Performed: AN AD HOC LABOR EPIDURAL     Patient location during evaluation: Mother Baby Anesthesia Type: Epidural Level of consciousness: awake and alert and oriented Pain management: satisfactory to patient Vital Signs Assessment: post-procedure vital signs reviewed and stable Respiratory status: respiratory function stable Cardiovascular status: stable Postop Assessment: no headache, no backache, epidural receding, patient able to bend at knees, no signs of nausea or vomiting, adequate PO intake and able to ambulate Anesthetic complications: no   No complications documented.  Last Vitals:  Vitals:   02/15/21 1109 02/15/21 1158  BP: (!) 155/93 (!) 158/100  Pulse: 71 79  Resp: 18 19  Temp: 37 C 37.3 C  SpO2: 100% 100%    Last Pain:  Vitals:   02/15/21 1158  TempSrc: Oral  PainSc: 8    Pain Goal:                   [name]

## 2021-02-15 NOTE — Discharge Instructions (Signed)

## 2021-02-15 NOTE — Progress Notes (Signed)
Attempted to give patient her medications after she called twice requesting them.  Once in the room the patient requested IV medications.  I spoke with Dr. Macon Large about IV medications and she said no IV medications will be given to patient.  I told patient that and she took phenergan PO first to see if it would settle her stomach.  She then refused the other meds at that time because she only wanted IV medication.  I again reiterated that I was not to give her any IV medication, only PO. I then went back in the room two more times within the hour and she refused meds and requested IV once again. She was then told again she could not have any IV medications and I would waste her medication since she did not want to take them.

## 2021-02-15 NOTE — Progress Notes (Signed)
Lactation at bedside. °

## 2021-02-15 NOTE — Lactation Note (Signed)
This note was copied from a baby's chart. Lactation Consultation Note  Patient Name: [name] [id number] Date: 02/15/2021 Reason for consult: Initial assessment;Term Age:35 hours   Initial LC Visit:  Per RN, mother does not desire a lactation consult today at all.  However, she would like to see a Advertising copywriter tomorrow (Monday).  Mother's feeding preference is breast/bottle.   Maternal Data    Feeding Mother's Current Feeding Choice: Breast Milk and Formula  LATCH Score Latch: Repeated attempts needed to sustain latch, nipple held in mouth throughout feeding, stimulation needed to elicit sucking reflex.  Audible Swallowing: None  Type of Nipple: Everted at rest and after stimulation (some areola edema)  Comfort (Breast/Nipple): Soft / non-tender  Hold (Positioning): Full assist, staff holds infant at breast  LATCH Score: 5   Lactation Tools Discussed/Used    Interventions Interventions: Breast feeding basics reviewed;Education  Discharge    Consult Status Consult Status: Follow-up Date: 02/16/21 Follow-up type: In-patient    Darneshia Demary R Alexus Michael 02/15/2021, 11:48 AM

## 2021-02-15 NOTE — Progress Notes (Signed)
    Faculty Practice OB/GYN Attending Note   Patient with severe range BP, had a few through out the day. Still complaining of RUQ pain, no headache, no visual symptoms.   Had normal LFTs yesterday. CBC and CMET re-ordered now, will follow up results and manage accordingly.  Concerned about severe preeclampsia superimposed on chronic hypertension.  Will treat with Procardia as per protocol, and magnesium sulfate infusion for eclampsia prophylaxis.  Will transfer to [facility] due to need for magnesium sulfate administration.  OBSC Charge RN notified of need for transfer.  Will start Procardia XL 30 mg daily for BP control, titrate as needed.  Continue close observation.   Jaynie Collins, MD, FACOG Obstetrician & Gynecologist, [facility] for [employer], [facility]

## 2021-02-16 ENCOUNTER — Encounter: Payer: Medicaid Other | Admitting: Obstetrics and Gynecology

## 2021-02-16 LAB — COMPREHENSIVE METABOLIC PANEL
ALT: 14 U/L (ref 0–44)
ALT: 15 U/L (ref 0–44)
AST: 21 U/L (ref 15–41)
AST: 22 U/L (ref 15–41)
Albumin: 2.3 g/dL — ABNORMAL LOW (ref 3.5–5.0)
Albumin: 2.7 g/dL — ABNORMAL LOW (ref 3.5–5.0)
Alkaline Phosphatase: 101 U/L (ref 38–126)
Alkaline Phosphatase: 121 U/L (ref 38–126)
Anion gap: 10 (ref 5–15)
Anion gap: 6 (ref 5–15)
BUN: 6 mg/dL (ref 6–20)
BUN: 6 mg/dL (ref 6–20)
CO2: 23 mmol/L (ref 22–32)
CO2: 25 mmol/L (ref 22–32)
Calcium: 8 mg/dL — ABNORMAL LOW (ref 8.9–10.3)
Calcium: 8.4 mg/dL — ABNORMAL LOW (ref 8.9–10.3)
Chloride: 102 mmol/L (ref 98–111)
Chloride: 98 mmol/L (ref 98–111)
Creatinine, Ser: 0.57 mg/dL (ref 0.44–1.00)
Creatinine, Ser: 0.81 mg/dL (ref 0.44–1.00)
GFR, Estimated: >60 mL/min (ref >60)
GFR, Estimated: >60 mL/min (ref >60)
Glucose, Bld: 100 mg/dL — ABNORMAL HIGH (ref 70–99)
Glucose, Bld: 94 mg/dL (ref 70–99)
Potassium: 3.3 mmol/L — ABNORMAL LOW (ref 3.5–5.1)
Potassium: 3.7 mmol/L (ref 3.5–5.1)
Sodium: 131 mmol/L — ABNORMAL LOW (ref 135–145)
Sodium: 133 mmol/L — ABNORMAL LOW (ref 135–145)
Total Bilirubin: 0.4 mg/dL (ref 0.3–1.2)
Total Bilirubin: 0.7 mg/dL (ref 0.3–1.2)
Total Protein: 5.6 g/dL — ABNORMAL LOW (ref 6.5–8.1)
Total Protein: 6.4 g/dL — ABNORMAL LOW (ref 6.5–8.1)

## 2021-02-16 LAB — CBC
HCT: 36.7 % (ref 36.0–46.0)
Hemoglobin: 12.4 g/dL (ref 12.0–15.0)
MCH: 29.6 pg (ref 26.0–34.0)
MCHC: 33.8 g/dL (ref 30.0–36.0)
MCV: 87.6 fL (ref 80.0–100.0)
Platelets: 172 10*3/uL (ref 150–400)
RBC: 4.19 MIL/uL (ref 3.87–5.11)
RDW: 12.7 % (ref 11.5–15.5)
WBC: 15.9 10*3/uL — ABNORMAL HIGH (ref 4.0–10.5)
nRBC: 0 % (ref 0.0–0.2)

## 2021-02-16 LAB — MAGNESIUM: Magnesium: 5.3 mg/dL — ABNORMAL HIGH (ref 1.7–2.4)

## 2021-02-16 MED ORDER — OXYCODONE HCL 5 MG PO TABS
5.0000 mg | ORAL_TABLET | ORAL | Status: DC | PRN
Start: 2021-02-16 — End: 2021-02-18
  Administered 2021-02-16 – 2021-02-18 (×8): 5 mg via ORAL
  Filled 2021-02-16 (×8): qty 1

## 2021-02-16 MED ORDER — NIFEDIPINE ER OSMOTIC RELEASE 30 MG PO TB24
30.0000 mg | ORAL_TABLET | Freq: Every day | ORAL | Status: DC
  Administered 2021-02-16 – 2021-02-18 (×3): 30 mg via ORAL
  Filled 2021-02-16 (×3): qty 1

## 2021-02-16 MED ORDER — SODIUM CHLORIDE 0.9 % IV SOLN
25.0000 mg | Freq: Four times a day (QID) | INTRAVENOUS | Status: DC | PRN
  Administered 2021-02-16: 25 mg via INTRAVENOUS
  Filled 2021-02-16: qty 1

## 2021-02-16 NOTE — Progress Notes (Signed)
Spoke with Dr. Macon Large in regards to patient's elevated blood pressure. New orders received.

## 2021-02-16 NOTE — Lactation Note (Signed)
This note was copied from a baby's chart. Lactation Consultation Note  Patient Name: [name] [id number]'[id number] Date: 02/16/2021 Reason for consult: Follow-up assessment Age:35 years   Follow up 28 hours old infant with 5.89% weight loss at the time of visit. Mother is bottlefeeding infant at time of arrival. LC observed infant failing to seal lips around nipple, disorganized suckles and pushing out with tongue.  Inquired about breastfeeding intentions. Mother states she is in a lot of pain and very tired due to medications. LC talked about latching infant and/or pumping. Mother explains she has not done any. LC offered a manual pump for breast stimulation. LC asked about WIC participation, mother replied she has not contacted them yet.  LC offered to burp infant and finish bottlefeeding.  Infant continued same pattern as described above.  Mother fell asleep during [facility] visit. Per support person request, LC swaddled infant. Infant had ~30 mL of formula via bottle. Per support person, infant has been using a pacifier due to fussiness.  LC urged support person and/or mother to call for assistance with pumping or any other needs. Unable to discuss feeding plan. All questions answered at this time.      Feeding Mother's Current Feeding Choice: Breast Milk and Formula Nipple Type: Slow - flow  Lactation Tools Discussed/Used Tools: Pump Breast pump type: Manual  Interventions Interventions: Hand pump;Education;Breast feeding basics reviewed  Discharge Pump: Manual  Consult Status Consult Status: Follow-up Date: 02/17/21 Follow-up type: In-patient    Alizon Schmeling A Higuera Ancidey 02/16/2021, 1:22 PM

## 2021-02-16 NOTE — Progress Notes (Signed)
Report given to [name], RN. IV team came to [name]'s room and placed a IV in [name]'s left hand prior to departure. [name] taken to [facility] specialty floor, room 104.

## 2021-02-16 NOTE — Progress Notes (Signed)
CSW acknowledged consult and completed chart review.  When CSW arrived, MOB was on IV Mag and was asleep.  An unidentified female was in the room holding infant and was on the telephone.  CSW will attempt to meet with MOB agaom when Mag is discontinued.   Blaine Hamper, MSW, LCSW Clinical Social Work [phone]

## 2021-02-16 NOTE — Progress Notes (Signed)
POSTPARTUM PROGRESS NOTE  PPD#1  Subjective:  BERDINA CHEEVER is a 35 y.o. [id number] s/p VAVD at [redacted]w[redacted]d. Today she notes that other than feeling tired she has no acute complaints. She denies any problems with ambulating, voiding or po intake. Denies nausea or vomiting. She has + flatus, noBM.  Pain is well controlled.  Lochia moderate Denies fever/chills/chest pain/SOB.  no HA, no blurry vision, noRUQ pain  Objective: Blood pressure (!) 144/94, pulse 73, temperature 97.9 F (36.6 C), temperature source Oral, resp. rate 18, height 5\' 6"  (1.676 m), weight 103 kg, last menstrual period 04/13/2020, SpO2 100 %.  UOP: 2400/8hr  Physical Exam:  General: alert, cooperative and no distress Chest: CTAB Heart: regular rate and rhythm Abdomen: soft, nontender, Uterine Fundus: firm, appropriately tender DVT Evaluation: No calf swelling or tenderness Extremities: no edema Skin: warm, dry  Labs:  Results for orders placed or performed during the hospital encounter of 02/14/21 (from the past 24 hour(s))  Comprehensive metabolic panel     Status: Abnormal   Collection Time: 02/15/21 11:41 PM  Result Value Ref Range   Sodium 133 (L) 135 - 145 mmol/L   Potassium 3.7 3.5 - 5.1 mmol/L   Chloride 102 98 - 111 mmol/L   CO2 25 22 - 32 mmol/L   Glucose, Bld 94 70 - 99 mg/dL   BUN 6 6 - 20 mg/dL   Creatinine, Ser 02/17/21 0.44 - 1.00 mg/dL   Calcium 8.4 (L) 8.9 - 10.3 mg/dL   Total Protein 5.6 (L) 6.5 - 8.1 g/dL   Albumin 2.3 (L) 3.5 - 5.0 g/dL   AST 22 15 - 41 U/L   ALT 14 0 - 44 U/L   Alkaline Phosphatase 101 38 - 126 U/L   Total Bilirubin 0.4 0.3 - 1.2 mg/dL   GFR, Estimated 7.56 >43 mL/min   Anion gap 6 5 - 15  CBC     Status: Abnormal   Collection Time: 02/15/21 11:41 PM  Result Value Ref Range   WBC 20.6 (H) 4.0 - 10.5 K/uL   RBC 3.41 (L) 3.87 - 5.11 MIL/uL   Hemoglobin 10.4 (L) 12.0 - 15.0 g/dL   HCT 02/17/21 (L) 95.1 - 88.4 %   MCV 88.6 80.0 - 100.0 fL   MCH 30.5 26.0 - 34.0 pg   MCHC 34.4  30.0 - 36.0 g/dL   RDW 16.6 06.3 - 01.6 %   Platelets 159 150 - 400 K/uL   nRBC 0.0 0.0 - 0.2 %     Assessment/Plan: MARQUISE LAMBSON is a 35 y.o. 20 s/p VAVD at [redacted]w[redacted]d PPD#1 complicated by: 1) cHTN with superimposed preeclampsia -currently on Magnesium x 24hr -Procardia XL 30mg  daily -labs stable -pt asymptomatic  2) Postpartum care -pain controlled -encouraged ambulation  Contraception: Depo shot prior to discharge Feeding: breast  Dispo: Continue with postpartum care as outlined above   LOS: 2 days   [redacted]w[redacted]d, DO Faculty Attending, [facility] 02/16/2021, 9:44 AM

## 2021-02-17 NOTE — Progress Notes (Signed)
CLINICAL SOCIAL WORK MATERNAL/CHILD NOTE  Patient Details  Name: [name] MRN: [id number] Date of Birth: 02/15/2021  Date:  02/17/2021  Clinical Social Worker Initiating Note:  Laurey Arrow Date/Time: Initiated:  02/17/21/1319     Child's Name:  [name]   Biological Parents:  Mother (MOB declined to share any information about FOB.)   Need for Interpreter:  None   Reason for Referral:  Current Substance Use/Substance Use During Pregnancy    Address:  [place] [address]    Phone number:  [phone] (home)     Additional phone number: MOB's Father number is [phone]  Household Members/Support Persons (HM/SP):   Household Member/Support Person 1,Household Member/Support Person 2 (MOB reported that she resides with her father and sister)   HM/SP Name Relationship DOB or Age  HM/SP -Gering MOB's father unknown  HM/SP -2 Delberta Folts son 01/24/2009  HM/SP -3        HM/SP -4        HM/SP -5        HM/SP -6        HM/SP -7        HM/SP -8          Natural Supports (not living in the home):  Immediate Family,Extended Surveyor, quantity Supports: None   Employment: Unemployed   Type of Work:     Education:  [place] arranged:    Museum/gallery curator Resources:  [employer]   Other Resources:  Physicist, medical  (CSW provided MOB with information to apply for [facility].)   Cultural/Religious Considerations Which May Impact Care:  none reported  Strengths:  Ability to meet basic needs ,Pediatrician chosen,Home prepared for child    Psychotropic Medications:         Pediatrician:    Solicitor area  Pediatrician List:   [facility] for [place]      Pediatrician Fax Number:    Risk Factors/Current Problems:  Substance Use    Cognitive State:  Able to Concentrate ,Alert ,Insightful  ,Linear Thinking    Mood/Affect:  Comfortable ,Interested ,Flat ,Relaxed    CSW Assessment: CSW met with MOB at MOB's bedside in room 104. When CSW arrived, MOB was being wheeled outside by NT.  CSW asked if CSW and Water engineer, K. Williams could meet with MOB before MOB went out side; MOB agreed. MOB guest Bayard Hugger) was also present.  CSW explained CSW's role and MOB gave CSW permission to ask MOB's friend to step outside of the room in order for CSW and CPS to assess MOB in private; MOB's friend left without incident.   CSW asked about MOB's substance use hx and MOB openly shared the use of Fentanyl during pregnancy. MOB reported her last use was "Over a month ago."  MOB denied the use of all other illicit substance and declined resources for outpatient and inpatient counseling. MOB also acknowledged having Rx for Suboxone and reported, "I only took it briefly." CSW explained hospital's substance use policy and MOB was understanding. MOB is aware that infant's UDS was negative and CSW will continue to monitor infant's CDS and will report results to CPS.   MOB communicated having all essential items to care for infant including a new car seat and a bassinet.   CPS shared that they  became involved due to a community report. CSW left CPS in the room with MOB to complete a safety plan.  CPS reported that there are no barriers to infant discharging to MOB when infant is medically ready. CPS will continue to offer family resources and supports post discharge.   CSW Plan/Description:  No Further Intervention Required/No Barriers to Discharge,Sudden Infant Death Syndrome (SIDS) Education,Perinatal Mood and Anxiety Disorder (PMADs) Education,Neonatal Abstinence Syndrome (NAS) Education,Other Patient/Family Education,Hospital Drug Screen Policy Information,Other Information/Referral to [employer] Will Continue to Monitor Umbilical Cord Tissue Drug Screen Results and Make Report if  Warranted   Laurey Arrow, MSW, LCSW Clinical Social Work [phone]

## 2021-02-17 NOTE — Progress Notes (Signed)
Post Partum Day 2 Subjective: Pt has no complaints this morning. Ambulating, voiding, tolerating diet and good oral pain control. Denies HA or visual changes  Objective: Blood pressure 124/87, pulse 86, temperature 98.4 F (36.9 C), temperature source Oral, resp. rate 17, height 5\' 6"  (1.676 m), weight 103 kg, last menstrual period 04/13/2020, SpO2 99 %.  Physical Exam:  General: alert Lochia: appropriate Uterine Fundus: firm Incision: NA DVT Evaluation: No evidence of DVT seen on physical exam.  Recent Labs    02/15/21 2341 02/16/21 0938  HGB 10.4* 12.4  HCT 30.2* 36.7    Assessment/Plan: Stable. S/P Magnesium. BP controlled with Procardia. Plan for discharge home    LOS: 3 days   02/18/21 02/17/2021, 11:11 AM

## 2021-02-17 NOTE — Lactation Note (Signed)
This note was copied from a baby's chart. Lactation Consultation Note  Patient Name: [name] [id number] Date: 02/17/2021   Age:35 hours   Lactation followed up with [name]. She was in the shower at this time, and her support person was present in the room. I wrote my name on the whiteboard and asked her support person to let her know that I stopped by and to call if she'd like to be seen.  As I left the room, I spoke with her RN, Armando Reichert, and was informed that [name] wishes to bottle feed formula. I encouraged her to call lactation if something changes.    Feeding Nipple Type: Slow - flow     Consult Status Consult Status: Complete Date: 02/17/21 Follow-up type: In-patient    Walker Shadow 02/17/2021, 11:36 AM

## 2021-02-18 ENCOUNTER — Other Ambulatory Visit (HOSPITAL_COMMUNITY): Payer: Self-pay

## 2021-02-18 ENCOUNTER — Encounter (HOSPITAL_COMMUNITY): Payer: Self-pay | Admitting: Obstetrics & Gynecology

## 2021-02-18 MED ORDER — NIFEDIPINE ER 30 MG PO TB24
30.0000 mg | ORAL_TABLET | Freq: Every day | ORAL | 1 refills | Status: AC
  Filled 2021-02-18: qty 30, 30d supply, fill #0

## 2021-02-18 MED ORDER — IBUPROFEN 600 MG PO TABS
600.0000 mg | ORAL_TABLET | Freq: Four times a day (QID) | ORAL | 0 refills | Status: DC
Start: 2021-02-18 — End: 2023-06-24
  Filled 2021-02-18: qty 30, 8d supply, fill #0

## 2021-02-18 MED ORDER — OXYCODONE HCL 5 MG PO TABS
5.0000 mg | ORAL_TABLET | Freq: Four times a day (QID) | ORAL | 0 refills | Status: DC | PRN
  Filled 2021-02-18: qty 20, 5d supply, fill #0

## 2021-02-18 NOTE — Progress Notes (Signed)
Discharge instructions reviewed   Teaching complete  Rooming in with baby pt explained to pt

## 2021-02-18 NOTE — Plan of Care (Signed)
  Problem: Education: Goal: Knowledge of General Education information will improve Description: Including pain rating scale, medication(s)/side effects and non-pharmacologic comfort measures 02/18/2021 1659 by Pat Patrick, RN Outcome: Adequate for Discharge 02/18/2021 1657 by Pat Patrick, RN Outcome: Adequate for Discharge   Problem: Health Behavior/Discharge Planning: Goal: Ability to manage health-related needs will improve 02/18/2021 1659 by Pat Patrick, RN Outcome: Adequate for Discharge 02/18/2021 1657 by Pat Patrick, RN Outcome: Adequate for Discharge   Problem: Clinical Measurements: Goal: Ability to maintain clinical measurements within normal limits will improve 02/18/2021 1659 by Pat Patrick, RN Outcome: Adequate for Discharge 02/18/2021 1657 by Pat Patrick, RN Outcome: Adequate for Discharge Goal: Will remain free from infection 02/18/2021 1659 by Pat Patrick, RN Outcome: Adequate for Discharge 02/18/2021 1657 by Pat Patrick, RN Outcome: Adequate for Discharge Goal: Diagnostic test results will improve 02/18/2021 1659 by Pat Patrick, RN Outcome: Adequate for Discharge 02/18/2021 1657 by Pat Patrick, RN Outcome: Adequate for Discharge Goal: Respiratory complications will improve 02/18/2021 1659 by Pat Patrick, RN Outcome: Adequate for Discharge 02/18/2021 1657 by Pat Patrick, RN Outcome: Adequate for Discharge Goal: Cardiovascular complication will be avoided 02/18/2021 1659 by Pat Patrick, RN Outcome: Adequate for Discharge 02/18/2021 1657 by Pat Patrick, RN Outcome: Adequate for Discharge   Problem: Clinical Measurements: Goal: Will remain free from infection 02/18/2021 1659 by Pat Patrick, RN Outcome: Adequate for Discharge 02/18/2021 1657 by Pat Patrick, RN Outcome: Adequate for Discharge   Problem: Clinical  Measurements: Goal: Diagnostic test results will improve 02/18/2021 1659 by Pat Patrick, RN Outcome: Adequate for Discharge 02/18/2021 1657 by Pat Patrick, RN Outcome: Adequate for Discharge   Problem: Coping: Goal: Level of anxiety will decrease 02/18/2021 1659 by Pat Patrick, RN Outcome: Adequate for Discharge 02/18/2021 1657 by Pat Patrick, RN Outcome: Adequate for Discharge   Problem: Elimination: Goal: Will not experience complications related to bowel motility 02/18/2021 1659 by Pat Patrick, RN Outcome: Adequate for Discharge 02/18/2021 1657 by Pat Patrick, RN Outcome: Adequate for Discharge Goal: Will not experience complications related to urinary retention 02/18/2021 1659 by Pat Patrick, RN Outcome: Adequate for Discharge 02/18/2021 1657 by Pat Patrick, RN Outcome: Adequate for Discharge   Problem: Pain Managment: Goal: General experience of comfort will improve 02/18/2021 1659 by Pat Patrick, RN Outcome: Adequate for Discharge 02/18/2021 1657 by Pat Patrick, RN Outcome: Adequate for Discharge

## 2021-02-18 NOTE — Plan of Care (Signed)
  Problem: Education: Goal: Knowledge of General Education information will improve Description: Including pain rating scale, medication(s)/side effects and non-pharmacologic comfort measures Outcome: Adequate for Discharge   

## 2021-02-18 NOTE — Discharge Summary (Signed)
Postpartum Discharge Summary  Date of Service updated 02/18/2021     Patient Name: [name] DOB: Sep 24, 1986 MRN: [id number]  Date of admission: 02/14/2021 Delivery date:02/15/2021  Delivering provider: Seabron Spates  Date of discharge: 02/18/2021  Admitting diagnosis: Preeclampsia [O14.90] Intrauterine pregnancy: [redacted]w[redacted]d    Secondary diagnosis:  Principal Problem:   Severe Preeclampsia superimposed on chronic hypertension, postpartum Active Problems:   Generalized anxiety disorder   Nausea and vomiting   Benign essential hypertension   History of substance abuse (HCC)   Intravenous drug use during pregnancy, antepartum   Opioid use disorder, severe, [place]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 See delivery note for additional information. Received magnesium x 24 hrs and was started on Procardia for BP control. She otherwise progressed to ambulating, tolerating diet, voiding, passing flatus and good oral pain control. Felt amendable for discharge home. Discharge instructions, medications and follow up reviewed with pt. Pt verbalized understanding  Magnesium Sulfate received: Yes: Seizure prophylaxis BMZ received: No Rhophylac:N/A MMR:N/A T-DaP:Given prenatally Flu: No Transfusion:No  Physical exam  Vitals:   02/17/21 2315 02/18/21 0536 02/18/21 0744 02/18/21 0847  BP: 104/62 124/68 (!) 155/101 (!) 146/85  Pulse: 90 86 70 69  Resp: '18 18 18   ' Temp: 98.8 F (37.1 C) 98.4 F (36.9 C)  98.3 F (36.8 C)  TempSrc:  Oral Oral  Oral  SpO2: 98% 98% 98%   Weight:      Height:       General: alert Lochia: appropriate Uterine Fundus: firm Incision: Healing well with no significant drainage DVT Evaluation: No evidence of DVT seen on physical exam. Labs: Lab Results  Component Value Date   WBC 15.9 (H) 02/16/2021   HGB 12.4 02/16/2021   HCT 36.7 02/16/2021   MCV 87.6 02/16/2021   PLT 172 02/16/2021   CMP Latest Ref Rng & Units 02/16/2021  Glucose 70 - 99 mg/dL 100(H)  BUN 6 - 20 mg/dL 6  Creatinine 0.44 - 1.00 mg/dL 0.57  Sodium 135 - 145 mmol/L 131(L)  Potassium 3.5 - 5.1 mmol/L 3.3(L)  Chloride 98 - 111 mmol/L 98  CO2 22 - 32 mmol/L 23  Calcium 8.9 - 10.3 mg/dL 8.0(L)  Total Protein 6.5 - 8.1 g/dL 6.4(L)  Total Bilirubin 0.3 - 1.2 mg/dL 0.7  Alkaline Phos 38 - 126 U/L 121  AST 15 - 41 U/L 21  ALT 0 - 44 U/L 15   Edinburgh Score: No flowsheet data found.   After visit meds:  Allergies as of 02/18/2021      Reactions   Buprenorphine Hcl Other (See Comments)   "whole body feels aggravated"   Morphine And Related Other (See Comments)   Irritation      Medication List    STOP taking these medications   Splint Wrist Brace/Left-Right Misc     TAKE  these medications   Blood Pressure Monitor Kit 1 Device by Does not apply route once a week. To be monitored Regularly at home.   ibuprofen 600 MG tablet Commonly known as: ADVIL Take 1 tablet (600 mg total) by mouth every 6 (six) hours.   NIFEdipine 30 MG 24 hr tablet Commonly known as: ADALAT CC Take 1 tablet (30 mg total) by mouth daily. Start taking on: February 19, 2021   omeprazole 40 MG capsule Commonly known as: PRILOSEC Take 1 capsule (40 mg total) by mouth daily.   oxyCODONE 5 MG immediate release tablet Commonly known as: Oxy IR/ROXICODONE Take 1 tablet (5 mg total) by mouth every 6 (six) hours as needed for severe pain.   Prenate Pixie 10-0.6-0.4-200 MG Caps Take 1 capsule by mouth daily.        Discharge home  in stable condition Infant Feeding: Bottle Infant Disposition:home with mother Discharge instruction: per After Visit Summary and Postpartum booklet. Activity: Advance as tolerated. Pelvic rest for 6 weeks.  Diet: routine diet Future Appointments:No future appointments. Follow up Visit:  [place]. Schedule an appointment as soon as possible for a visit.   Why: 2 weeks for BP check  4 weeks for routine PP visit Contact information: [address] [place] [address] [phone]               Please schedule this patient for a In person postpartum visit in 2 weeks with the following provider: nurse for BP check. 4 weeks any provider for PP visit Additional Postpartum F/U:Postpartum Depression checkup and BP check High risk pregnancy complicated by: HTN Delivery mode:  Vaginal, Vacuum (Extractor)  Anticipated Birth Control:  Depo   02/18/2021 Chancy Milroy, MD

## 2021-02-19 LAB — SURGICAL PATHOLOGY

## 2022-11-04 ENCOUNTER — Ambulatory Visit
Admission: EM | Admit: 2022-11-04 | Discharge: 2022-11-04 | Disposition: A | Discharge status: Home / Self Care | Payer: Medicaid Other

## 2022-11-04 DIAGNOSIS — R220 Localized swelling, mass and lump, head: Secondary | ICD-10-CM | POA: Diagnosis not present

## 2022-11-04 DIAGNOSIS — H00019 Hordeolum externum unspecified eye, unspecified eyelid: Secondary | ICD-10-CM

## 2022-11-04 MED ORDER — NEOMYCIN-POLYMYXIN-DEXAMETH 3.5-10000-0.1 OP SUSP: 1.0000 [drp] | Freq: Four times a day (QID) | OPHTHALMIC | 0 refills | Status: AC

## 2022-11-04 MED ORDER — AMOXICILLIN-POT CLAVULANATE 875-125 MG PO TABS: 1.0000 | ORAL_TABLET | Freq: Two times a day (BID) | ORAL | 0 refills | Status: DC

## 2022-11-04 NOTE — Discharge Instructions (Signed)
I have prescribed you an oral antibiotic and antibiotic that you will put in your eye.  Please continue warm compresses.  Follow-up with eye doctor at provided contact information.

## 2022-11-04 NOTE — ED Triage Notes (Signed)
Pt c/o right eye irritation, right facial edema, and right ear pain since ~ christmas.
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
## 2023-06-24 ENCOUNTER — Encounter (HOSPITAL_COMMUNITY): Payer: Self-pay | Admitting: Emergency Medicine

## 2023-06-24 ENCOUNTER — Ambulatory Visit (HOSPITAL_COMMUNITY)
Admission: EM | Admit: 2023-06-24 | Discharge: 2023-06-24 | Disposition: A | Discharge status: Home / Self Care | Payer: MEDICAID | Attending: Internal Medicine | Admitting: Internal Medicine

## 2023-06-24 DIAGNOSIS — H00036 Abscess of eyelid left eye, unspecified eyelid: Secondary | ICD-10-CM | POA: Diagnosis not present

## 2023-06-24 MED ORDER — IBUPROFEN 800 MG PO TABS
800.0000 mg | ORAL_TABLET | Freq: Once | ORAL | Status: AC
  Administered 2023-06-24: 800 mg via ORAL

## 2023-06-24 MED ORDER — IBUPROFEN 600 MG PO TABS: 600.0000 mg | ORAL_TABLET | Freq: Four times a day (QID) | ORAL | 0 refills | Status: AC | PRN

## 2023-06-24 MED ORDER — IBUPROFEN 800 MG PO TABS
ORAL_TABLET | ORAL | Status: AC
  Filled 2023-06-24: qty 1

## 2023-06-24 MED ORDER — CEPHALEXIN 500 MG PO CAPS: 500.0000 mg | ORAL_CAPSULE | Freq: Four times a day (QID) | ORAL | 0 refills | Status: AC

## 2023-06-24 MED ORDER — ERYTHROMYCIN 5 MG/GM OP OINT: TOPICAL_OINTMENT | OPHTHALMIC | 0 refills | Status: AC

## 2023-06-24 NOTE — Discharge Instructions (Signed)
Take antibiotic as prescribed and apply ointment as prescribed. Take ibuprofen as prescribed for discomfort and can continue with warm compress. Return to care if no improvement in 2 days or sooner if worsening such as increased pain and swelling, drainage from the area, or develop a fever or vision changes.
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
## 2023-06-24 NOTE — ED Triage Notes (Signed)
Pt c/o stye to lt lower eye lid since yesterday. Used hot compresses and now larger and painful.

## 2024-04-09 ENCOUNTER — Emergency Department (HOSPITAL_COMMUNITY): Payer: MEDICAID

## 2024-04-09 ENCOUNTER — Emergency Department (HOSPITAL_COMMUNITY)
Admission: EM | Admit: 2024-04-09 | Discharge: 2024-04-09 | Discharge status: Against Medical Advice | Payer: MEDICAID | Attending: Emergency Medicine | Admitting: Emergency Medicine

## 2024-04-09 ENCOUNTER — Other Ambulatory Visit: Payer: Self-pay

## 2024-04-09 ENCOUNTER — Encounter (HOSPITAL_COMMUNITY): Payer: Self-pay

## 2024-04-09 DIAGNOSIS — Z5321 Procedure and treatment not carried out due to patient leaving prior to being seen by health care provider: Secondary | ICD-10-CM | POA: Insufficient documentation

## 2024-04-09 DIAGNOSIS — S0512XA Contusion of eyeball and orbital tissues, left eye, initial encounter: Secondary | ICD-10-CM | POA: Insufficient documentation
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
## 2024-04-11 ENCOUNTER — Telehealth (INDEPENDENT_AMBULATORY_CARE_PROVIDER_SITE_OTHER): Payer: Self-pay | Admitting: Otolaryngology

## 2024-04-11 ENCOUNTER — Encounter (HOSPITAL_COMMUNITY): Payer: Self-pay

## 2024-04-11 ENCOUNTER — Emergency Department (HOSPITAL_COMMUNITY)
Admission: EM | Admit: 2024-04-11 | Discharge: 2024-04-11 | Disposition: A | Discharge status: Home / Self Care | Payer: MEDICAID | Attending: Emergency Medicine | Admitting: Emergency Medicine

## 2024-04-11 ENCOUNTER — Other Ambulatory Visit: Payer: Self-pay

## 2024-04-11 DIAGNOSIS — S0240FA Zygomatic fracture, left side, initial encounter for closed fracture: Secondary | ICD-10-CM | POA: Diagnosis not present

## 2024-04-11 DIAGNOSIS — R519 Headache, unspecified: Secondary | ICD-10-CM | POA: Diagnosis present
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
## 2024-04-11 NOTE — Telephone Encounter (Signed)
 LVM for patient to call back to schedule appointment with Dr. Soldatova either today or tomorrow (06/11 or 06/12)
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
## 2024-04-12 ENCOUNTER — Encounter (INDEPENDENT_AMBULATORY_CARE_PROVIDER_SITE_OTHER): Payer: Self-pay | Admitting: Otolaryngology

## 2024-04-12 ENCOUNTER — Ambulatory Visit (INDEPENDENT_AMBULATORY_CARE_PROVIDER_SITE_OTHER): Payer: MEDICAID | Admitting: Otolaryngology

## 2024-04-12 VITALS — BP 131/83 | HR 94

## 2024-04-12 DIAGNOSIS — S0240FA Zygomatic fracture, left side, initial encounter for closed fracture: Secondary | ICD-10-CM | POA: Diagnosis not present

## 2024-04-12 NOTE — Patient Instructions (Signed)
 Arnica products ca help with bruising
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
## 2024-05-18 ENCOUNTER — Encounter: Payer: Self-pay | Admitting: Advanced Practice Midwife

## 2024-10-16 ENCOUNTER — Emergency Department (HOSPITAL_BASED_OUTPATIENT_CLINIC_OR_DEPARTMENT_OTHER): Payer: MEDICAID

## 2024-10-16 ENCOUNTER — Encounter (HOSPITAL_BASED_OUTPATIENT_CLINIC_OR_DEPARTMENT_OTHER): Payer: Self-pay

## 2024-10-16 ENCOUNTER — Emergency Department (HOSPITAL_BASED_OUTPATIENT_CLINIC_OR_DEPARTMENT_OTHER)
Admission: EM | Admit: 2024-10-16 | Discharge: 2024-10-16 | Disposition: A | Discharge status: Home / Self Care | Payer: MEDICAID | Attending: Emergency Medicine | Admitting: Emergency Medicine

## 2024-10-16 ENCOUNTER — Other Ambulatory Visit: Payer: Self-pay

## 2024-10-16 DIAGNOSIS — H5712 Ocular pain, left eye: Secondary | ICD-10-CM | POA: Diagnosis present

## 2024-10-16 DIAGNOSIS — S0502XA Injury of conjunctiva and corneal abrasion without foreign body, left eye, initial encounter: Secondary | ICD-10-CM

## 2024-10-16 DIAGNOSIS — S0012XA Contusion of left eyelid and periocular area, initial encounter: Secondary | ICD-10-CM | POA: Diagnosis not present

## 2024-10-16 DIAGNOSIS — S0592XA Unspecified injury of left eye and orbit, initial encounter: Secondary | ICD-10-CM

## 2024-10-16 DIAGNOSIS — W228XXA Striking against or struck by other objects, initial encounter: Secondary | ICD-10-CM | POA: Diagnosis not present

## 2024-10-16 MED ORDER — TETRACAINE HCL 0.5 % OP SOLN
1.0000 [drp] | Freq: Once | OPHTHALMIC | Status: AC
  Administered 2024-10-16: 18:00:00 1 [drp] via OPHTHALMIC
  Filled 2024-10-16: qty 4

## 2024-10-16 MED ORDER — ACETAMINOPHEN 500 MG PO TABS
500.0000 mg | ORAL_TABLET | Freq: Once | ORAL | Status: AC
  Administered 2024-10-16: 18:00:00 500 mg via ORAL
  Filled 2024-10-16: qty 1

## 2024-10-16 MED ORDER — POLYMYXIN B-TRIMETHOPRIM 10000-0.1 UNIT/ML-% OP SOLN
1.0000 [drp] | Freq: Four times a day (QID) | OPHTHALMIC | Status: DC
  Administered 2024-10-16: 20:00:00 1 [drp] via OPHTHALMIC
  Filled 2024-10-16: qty 10

## 2024-10-16 MED ORDER — OXYCODONE-ACETAMINOPHEN 5-325 MG PO TABS
1.0000 | ORAL_TABLET | Freq: Once | ORAL | Status: AC
  Administered 2024-10-16: 18:00:00 1 via ORAL
  Filled 2024-10-16: qty 1

## 2024-10-16 MED ORDER — IBUPROFEN 800 MG PO TABS
800.0000 mg | ORAL_TABLET | Freq: Once | ORAL | Status: AC
  Administered 2024-10-16: 20:00:00 800 mg via ORAL
  Filled 2024-10-16: qty 1

## 2024-10-16 MED ORDER — FLUORESCEIN SODIUM 1 MG OP STRP
1.0000 | ORAL_STRIP | Freq: Once | OPHTHALMIC | Status: AC
  Administered 2024-10-16: 18:00:00 1 via OPHTHALMIC
  Filled 2024-10-16: qty 1
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
## 2024-10-16 NOTE — Discharge Instructions (Addendum)
 You were found to have a corneal abrasion, which is a scratch to the surface of your eye. This should heal within the next week.   Thankfully, the CT of your head did not show any underlying fractures or other injuries.   Medications prescribed:  To help treat/prevent an eye infection, you have been prescribed an antibiotic eye drop called Polytrim  (Trimethoprim  and Polymyxin B ). Apply one drop to the affected eye every 6 hours for the next 5 days.   Home care instructions:  If you wear contact lenses, do not use them until your eye caregiver approves. Do not rub your eye as this can cause more damage.  You may apply ice to the left eyelid to aid with pain and swelling.  The swelling and bruising should start to go down with time.  You may take up to 1000mg  of tylenol  every 6 hours as needed for pain.  Do not take more then 4g per day.  You may use up to 600mg  ibuprofen  every 6 hours as needed for pain.  Do not exceed 2.4g of ibuprofen  per day.  You were given your first dose here today.  Your next dose can be no sooner than 4 AM  Follow-up instructions: Please follow-up with your primary care doctor OR the opthalmologist listed in the next 2-3 days for further evaluation and management of your symptoms.  Return instructions:  Please return to the Emergency Department if you experience worsening symptoms.  Please return immediately if you develop severe pain, pus drainage, new change in vision, or fever. Please return if you have any other emergent concerns.
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
## 2024-10-16 NOTE — ED Notes (Signed)
 Patient transferred from waiting room to ED treatment room. Assuming pt care at this time.
